# Patient Record
Sex: Male | Born: 1935
Health system: Southern US, Community
[De-identification: ages and names within clinical notes are randomized; demographics above are authoritative.]

## PROBLEM LIST (undated history)

## (undated) DIAGNOSIS — I714 Abdominal aortic aneurysm, without rupture, unspecified: Secondary | ICD-10-CM

## (undated) DIAGNOSIS — I639 Cerebral infarction, unspecified: Secondary | ICD-10-CM

## (undated) DIAGNOSIS — H9190 Unspecified hearing loss, unspecified ear: Secondary | ICD-10-CM

## (undated) DIAGNOSIS — E785 Hyperlipidemia, unspecified: Secondary | ICD-10-CM

## (undated) DIAGNOSIS — Z860101 Personal history of adenomatous and serrated colon polyps: Secondary | ICD-10-CM

## (undated) DIAGNOSIS — K649 Unspecified hemorrhoids: Secondary | ICD-10-CM

## (undated) DIAGNOSIS — K922 Gastrointestinal hemorrhage, unspecified: Secondary | ICD-10-CM

## (undated) DIAGNOSIS — I493 Ventricular premature depolarization: Secondary | ICD-10-CM

## (undated) DIAGNOSIS — Z87438 Personal history of other diseases of male genital organs: Secondary | ICD-10-CM

## (undated) DIAGNOSIS — I495 Sick sinus syndrome: Secondary | ICD-10-CM

## (undated) DIAGNOSIS — R2681 Unsteadiness on feet: Secondary | ICD-10-CM

## (undated) DIAGNOSIS — I739 Peripheral vascular disease, unspecified: Secondary | ICD-10-CM

## (undated) DIAGNOSIS — Z860100 Personal history of colon polyps, unspecified: Secondary | ICD-10-CM

## (undated) DIAGNOSIS — K219 Gastro-esophageal reflux disease without esophagitis: Secondary | ICD-10-CM

## (undated) DIAGNOSIS — M6282 Rhabdomyolysis: Secondary | ICD-10-CM

## (undated) DIAGNOSIS — K5790 Diverticulosis of intestine, part unspecified, without perforation or abscess without bleeding: Secondary | ICD-10-CM

## (undated) DIAGNOSIS — E669 Obesity, unspecified: Secondary | ICD-10-CM

## (undated) DIAGNOSIS — H3530 Unspecified macular degeneration: Secondary | ICD-10-CM

## (undated) DIAGNOSIS — G629 Polyneuropathy, unspecified: Secondary | ICD-10-CM

## (undated) DIAGNOSIS — K449 Diaphragmatic hernia without obstruction or gangrene: Secondary | ICD-10-CM

## (undated) DIAGNOSIS — M810 Age-related osteoporosis without current pathological fracture: Secondary | ICD-10-CM

## (undated) HISTORY — DX: Personal history of colonic polyps: Z86.010

## (undated) HISTORY — DX: Sick sinus syndrome: I49.5

## (undated) HISTORY — DX: Rhabdomyolysis: M62.82

## (undated) HISTORY — DX: Diaphragmatic hernia without obstruction or gangrene: K44.9

## (undated) HISTORY — DX: Gastrointestinal hemorrhage, unspecified: K92.2

## (undated) HISTORY — DX: Peripheral vascular disease, unspecified: I73.9

## (undated) HISTORY — DX: Hyperlipidemia, unspecified: E78.5

## (undated) HISTORY — DX: Unsteadiness on feet: R26.81

## (undated) HISTORY — DX: Ventricular premature depolarization: I49.3

## (undated) HISTORY — DX: Unspecified macular degeneration: H35.30

## (undated) HISTORY — DX: Unspecified hearing loss, unspecified ear: H91.90

## (undated) HISTORY — DX: Cerebral infarction, unspecified: I63.9

## (undated) HISTORY — PX: ABDOMINAL AORTIC ANEURYSM REPAIR: SUR1152

## (undated) HISTORY — DX: Unspecified hemorrhoids: K64.9

## (undated) HISTORY — PX: INGUINAL HERNIA REPAIR: SUR1180

## (undated) HISTORY — PX: KNEE SURGERY: SHX244

## (undated) HISTORY — PX: TONSILLECTOMY: SUR1361

## (undated) HISTORY — DX: Personal history of other diseases of male genital organs: Z87.438

## (undated) HISTORY — DX: Obesity, unspecified: E66.9

## (undated) HISTORY — DX: Personal history of colon polyps, unspecified: Z86.0100

## (undated) HISTORY — PX: CATARACT EXTRACTION: SUR2

---
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
## 1958-11-01 DIAGNOSIS — K449 Diaphragmatic hernia without obstruction or gangrene: Secondary | ICD-10-CM

## 1958-11-01 HISTORY — DX: Diaphragmatic hernia without obstruction or gangrene: K44.9

## 2004-09-01 ENCOUNTER — Ambulatory Visit: Payer: Self-pay | Admitting: Gastroenterology

## 2004-09-15 ENCOUNTER — Ambulatory Visit: Payer: Self-pay | Admitting: Gastroenterology

## 2004-09-15 ENCOUNTER — Ambulatory Visit (HOSPITAL_COMMUNITY): Admission: RE | Admit: 2004-09-15 | Discharge: 2004-09-15 | Payer: Self-pay | Admitting: Gastroenterology

## 2005-10-08 ENCOUNTER — Encounter: Admission: RE | Admit: 2005-10-08 | Discharge: 2005-10-08 | Payer: Self-pay

## 2006-06-10 ENCOUNTER — Ambulatory Visit: Payer: Self-pay | Admitting: Gastroenterology

## 2006-06-28 ENCOUNTER — Encounter: Payer: Self-pay | Admitting: Gastroenterology

## 2006-06-28 ENCOUNTER — Ambulatory Visit: Payer: Self-pay | Admitting: Gastroenterology

## 2009-04-30 ENCOUNTER — Encounter (INDEPENDENT_AMBULATORY_CARE_PROVIDER_SITE_OTHER): Payer: Self-pay | Admitting: *Deleted

## 2009-11-17 ENCOUNTER — Telehealth: Payer: Self-pay | Admitting: Gastroenterology

## 2009-12-24 ENCOUNTER — Encounter (INDEPENDENT_AMBULATORY_CARE_PROVIDER_SITE_OTHER): Payer: Self-pay | Admitting: *Deleted

## 2010-02-09 ENCOUNTER — Ambulatory Visit: Payer: Self-pay | Admitting: Gastroenterology

## 2010-02-09 ENCOUNTER — Encounter (INDEPENDENT_AMBULATORY_CARE_PROVIDER_SITE_OTHER): Payer: Self-pay | Admitting: *Deleted

## 2010-02-09 DIAGNOSIS — Z860100 Personal history of colon polyps, unspecified: Secondary | ICD-10-CM | POA: Insufficient documentation

## 2010-02-09 DIAGNOSIS — K219 Gastro-esophageal reflux disease without esophagitis: Secondary | ICD-10-CM

## 2010-03-24 ENCOUNTER — Ambulatory Visit: Payer: Self-pay | Admitting: Gastroenterology

## 2010-03-26 ENCOUNTER — Encounter: Payer: Self-pay | Admitting: Gastroenterology

## 2010-12-03 NOTE — Progress Notes (Signed)
Summary: Schedule recall colon  Phone Note Outgoing Call Call back at [facility] Phone [phone]   Call placed by: Christie Nottingham CMA Duncan Dull),  November 17, 2009 11:07 AM Call placed to: Patient Summary of Call: Called pt to schedule recall colonoscopy. Pt states he will be going to see his PCP next week and wants to discuss the recall with him first before scheduling. I informed pt of the importance of scheduling his recall due to his adenomatous polyps in the past. Pt agreed and states he will call us back next week to schedule. Initial call taken by: Christie Nottingham CMA Duncan Dull),  November 17, 2009 11:09 AM

## 2010-12-03 NOTE — Procedures (Signed)
Summary: Colonoscopy and biopsy   Colonoscopy  Procedure date:  06/28/2006  Findings:      Results: Hemorrhoids.     Results: Diverticulosis.       Pathology:  Adenomatous polyp.        Location:  [facility].    Procedures Next Due Date:    Colonoscopy: 07/2009  Colonoscopy  Procedure date:  06/28/2006  Findings:      Results: Hemorrhoids.     Results: Diverticulosis.       Pathology:  Adenomatous polyp.        Location:  [facility].    Procedures Next Due Date:    Colonoscopy: 07/2009 Patient Name: Carl, Rios MRN: [id number] Procedure Procedures: Colonoscopy CPT: 40981.    with polypectomy. CPT: A3573898.  Personnel: Endoscopist: Venita Lick. Russella Dar, MD, Clementeen Graham.  Exam Location: Exam performed in Outpatient Clinic. Outpatient  Patient Consent: Procedure, Alternatives, Risks and Benefits discussed, consent obtained, from patient. Consent was obtained by the RN.  Indications  Average Risk Screening Routine.  History  Current Medications: Patient is not currently taking Coumadin.  Pre-Exam Physical: Performed Jun 28, 2006. Cardio-pulmonary exam, Rectal exam, HEENT exam , Abdominal exam, Mental status exam WNL.  Comments: Pt. history reviewed/updated, physical exam performed prior to initiation of sedation?Yes Exam Exam: Extent of exam reached: Cecum, extent intended: Cecum.  The cecum was identified by appendiceal orifice and IC valve. Time to Cecum: 00:09: 54. Time for Withdrawl: 00:10:54. Colon retroflexion performed. ASA Classification: II. Tolerance: excellent.  Monitoring: Pulse and BP monitoring, Oximetry used. Supplemental O2 given.  Colon Prep Used MoviPrep for colon prep. Prep results: good.  Sedation Meds: Patient assessed and found to be appropriate for moderate (conscious) sedation. Fentanyl 100 mcg. given IV. Versed 10 mg. given IV.  Findings NORMAL EXAM: Splenic Flexure to Descending Colon.  POLYP: Ascending  Colon, Maximum size: 3 mm. sessile polyp. Procedure:  snare without cautery, removed, retrieved, Polyp sent to pathology. ICD9: Colon Polyps: 211.3.  - DIVERTICULOSIS: Sigmoid Colon. Not bleeding. ICD9: Diverticulosis: 562.10.  NORMAL EXAM: Cecum.  POLYP: Sigmoid Colon, Maximum size: 8 mm. pedunculated polyp. Procedure:  snare with cautery, removed, retrieved, sent to pathology. ICD9: Colon Polyps: 211.3.  POLYP: Sigmoid Colon, Maximum size: 3 mm. sessile polyp. Procedure:  snare without cautery, removed, retrieved, sent to pathology. ICD9: Colon Polyps: 211.3.  HEMORRHOIDS: Internal. Size: Small. Not bleeding. Not thrombosed. ICD9: Hemorrhoids, Internal: 455.0.   Assessment  Diagnoses: 211.3: Colon Polyps.  562.10: Diverticulosis.  455.0: Hemorrhoids, Internal.   Events  Unplanned Interventions: No intervention was required.  Unplanned Events: There were no complications. Plans  Post Exam Instructions: Post sedation instructions given. No aspirin or non-steroidal containing medications: 2 weeks.  Medication Plan: Await pathology.  Patient Education: Patient given standard instructions for: Polyps. Diverticulosis. Hemorrhoids.  Disposition: After procedure patient sent to recovery. After recovery patient sent home.  Scheduling/Referral: Primary Care Provider, to Kari Baars, MD, as planned,  Colonoscopy, to [facility] T. Russella Dar, MD, [facility], if polyp(s) adenomatous and health status appropriate, around Jun 28, 2009.    This report was created from the original endoscopy report, which was reviewed and signed by the above listed endoscopist.    cc: Robert Bellow. Clelia Croft, MD             SP Surgical Pathology - STATUS: Final  Perform Date: 28Aug07 00:01  Ordered By: Rica Records Date: 29Aug07 15:08  Facility: [facility]                               Department: CPATH  Service Report Text  [facility]   [address] [address]   [place], [place] [address]   Telephone [phone] or [phone] Fax [phone]    REPORT OF SURGICAL PATHOLOGY    Case #: [id number]   Patient Name: Carl Rios, Carl Rios.   Office Chart Number: [id number]    MRN: [id number]   Pathologist: Arvid Right, MD   DOB[birth year]Age [birth year] (Age: 77) Gender: M   Date Taken: 06/28/2006   Date Received: 06/29/2006    FINAL DIAGNOSIS    ***MICROSCOPIC EXAMINATION AND DIAGNOSIS***    ASCENDING AND SIGMOID COLON POLYPS (ENDOSCOPIC BIOPSY):   - THREE TUBULAR ADENOMAS.   - NO HIGH GRADE DYSPLASIA IDENTIFIED.   - TWO HYPERPLASTIC POLYPS    kv   Date Reported: 06/30/2006 Crystal Moore-Maxwell, MD   *** Electronically Signed Out By [facility] ***    Clinical information   Screening; R/O adenomas (caf)    specimen(s) obtained   Colon, polyp(s), ascending and sigmoid    Gross Description   Received in formalin are tan, soft tissue fragments that are   submitted in toto. Number: seven.   Size: 0.1 to 0.6 cm. One block. (TB:caf 06/29/06)    cf/

## 2010-12-03 NOTE — Letter (Signed)
Summary: [facility] Instructions  [facility]  [address] [place], [place] [address]   Phone: [phone]  Fax: [phone]       Carl Rios    August 30, 1926    MRN: [id number]        Procedure Day /Date: Tuesday May 24th, 2011     Arrival Time: 9:30am     Procedure Time: 10:30am     Location of Procedure:                    _x _  [facility] (4th Floor)                        PREPARATION FOR COLONOSCOPY WITH MOVIPREP   Starting 5 days prior to your procedure 5/19/11do not eat nuts, seeds, popcorn, corn, beans, peas,  salads, or any raw vegetables.  Do not take any fiber supplements (e.g. Metamucil, Citrucel, and Benefiber).  THE DAY BEFORE YOUR PROCEDURE         DATE: 03/23/10  DAY: Monday  1.  Drink clear liquids the entire day-NO SOLID FOOD  2.  Do not drink anything colored red or purple.  Avoid juices with pulp.  No orange juice.  3.  Drink at least 64 oz. (8 glasses) of fluid/clear liquids during the day to prevent dehydration and help the prep work efficiently.  CLEAR LIQUIDS INCLUDE: Water Jello Ice Popsicles Tea (sugar ok, no milk/cream) Powdered fruit flavored drinks Coffee (sugar ok, no milk/cream) Gatorade Juice: apple, white grape, white cranberry  Lemonade Clear bullion, consomm, broth Carbonated beverages (any kind) Strained chicken noodle soup Hard Candy                             4.  In the morning, mix first dose of MoviPrep solution:    Empty 1 Pouch A and 1 Pouch B into the disposable container    Add lukewarm drinking water to the top line of the container. Mix to dissolve    Refrigerate (mixed solution should be used within 24 hrs)  5.  Begin drinking the prep at 5:00 p.m. The MoviPrep container is divided by 4 marks.   Every 15 minutes drink the solution down to the next mark (approximately 8 oz) until the full liter is complete.   6.  Follow completed prep with 16 oz of clear liquid of your choice  (Nothing red or purple).  Continue to drink clear liquids until bedtime.  7.  Before going to bed, mix second dose of MoviPrep solution:    Empty 1 Pouch A and 1 Pouch B into the disposable container    Add lukewarm drinking water to the top line of the container. Mix to dissolve    Refrigerate  THE DAY OF YOUR PROCEDURE      DATE: 03/24/10 DAY: Tuesday  Beginning at 5:30 a.m. (5 hours before procedure):         1. Every 15 minutes, drink the solution down to the next mark (approx 8 oz) until the full liter is complete.  2. Follow completed prep with 16 oz. of clear liquid of your choice.    3. You may drink clear liquids until 8:30am (2 HOURS BEFORE PROCEDURE).   MEDICATION INSTRUCTIONS  Unless otherwise instructed, you should take regular prescription medications with a small sip of water   as early as possible the morning of your procedure.  OTHER INSTRUCTIONS  You will need a responsible adult at least 75 years of age to accompany you and drive you home.   This person must remain in the waiting room during your procedure.  Wear loose fitting clothing that is easily removed.  Leave jewelry and other valuables at home.  However, you may wish to bring a book to read or  an iPod/MP3 player to listen to music as you wait for your procedure to start.  Remove all body piercing jewelry and leave at home.  Total time from sign-in until discharge is approximately 2-3 hours.  You should go home directly after your procedure and rest.  You can resume normal activities the  day after your procedure.  The day of your procedure you should not:   Drive   Make legal decisions   Operate machinery   Drink alcohol   Return to work  You will receive specific instructions about eating, activities and medications before you leave.    The above instructions have been reviewed and explained to me by   Estill Bamberg.     I fully understand and can verbalize these  instructions _____________________________ Date _________

## 2010-12-03 NOTE — Procedures (Signed)
Summary: EGD   EGD  Procedure date:  09/15/2004  Findings:      Findings: Stricture:  Location: [facility]   Patient Name: Carl Rios, Carl Rios MRN: [id number] Procedure Procedures: Panendoscopy (EGD) CPT: 43235.    with biopsy(s)/brushing(s). CPT: D1846139.    with balloon dilation. CPT: T9508883.  Personnel: Endoscopist: Venita Lick. Russella Dar, MD, Clementeen Graham.  Referred By: Sandra Cockayne, MD.  Exam Location: Exam performed in Endoscopy Suite. Outpatient  Patient Consent: Procedure, Alternatives, Risks and Benefits discussed, consent obtained, from patient.  Indications Symptoms: Dysphagia. Reflux symptoms  History  Current Medications: Patient is not currently taking Coumadin.  Pre-Exam Physical: Performed Sep 15, 2004  Entire physical exam was normal.  Exam Exam Info: Maximum depth of insertion Duodenum, intended Duodenum. Vocal cords not visualized. Gastric retroflexion performed. ASA Classification: II. Tolerance: excellent.  Sedation Meds: Patient assessed and found to be appropriate for moderate (conscious) sedation. Cetacaine Spray 2 sprays given aerosolized. Fentanyl 50 mcg. given IV. Versed 4 mg. given IV.  Monitoring: BP and pulse monitoring done. Oximetry used. Supplemental O2 given  Findings Normal: Proximal Esophagus to Mid Esophagus.  STRICTURE / STENOSIS: Stricture in Distal Esophagus.  Constriction: partial. Lumen diameter is 14 mm. Biopsy of Stricture/Steno  taken. ICD9: Esophageal Stricture: 530.3.  - Dilation: Distal Esophagus. Balloon/Microvasive dilator used, Diameter: 15,16.5,18 mm, Minimal Resistance, No Heme present on extraction. Patient tolerance excellent. Outcome: successful. Comments: Microvasive CRE.  HIATAL HERNIA: Regular, 4 cms. in length. ICD9: Hernia, Hiatal: 553.3. Normal: Fundus to Duodenal 2nd Portion.   Assessment  Diagnoses: 530.3: Esophageal Stricture.  553.3: Hernia, Hiatal.   Events  Unplanned Intervention: No  unplanned interventions were required.  Unplanned Events: There were no complications. Plans Instructions: Nothing to eat or drink for 1 hour.  Clear or full liquids: 2 hours. No aspirin or non-steroidal containing medications: 1 week.  Medication(s): Await pathology. Continue current medications. PPI: Esomeprazole/Nexium 40 mg QAM, for indefinitely.   Patient Education: Patient given standard instructions for: Hiatal Hernia. Reflux. Stenosis / Stricture.  Disposition: After procedure patient sent to recovery. After recovery patient sent home.  Scheduling: Office Visit, to [employer]. Russella Dar, MD, Clementeen Graham, prn  Primary Care Provider, to Sandra Cockayne, MD,    This report was created from the original endoscopy report, which was reviewed and signed by the above listed endoscopist.    cc: Robert Bellow. Clelia Croft, MD

## 2010-12-03 NOTE — Procedures (Signed)
Summary: Colonoscopy  Patient: Carl Rios Note: All result statuses are Final unless otherwise noted.  Tests: (1) Colonoscopy (COL)   COL Colonoscopy           DONE     [facility]     [address]     [place], [place]  [address]           COLONOSCOPY PROCEDURE REPORT           PATIENT:  Carl Rios, Carl Rios  MR#:  [id number]     BIRTHDATE:  10/14/1926, 84 yrs. old  GENDER:  male     ENDOSCOPIST:  Judie Petit T. Russella Dar, MD, [facility]           PROCEDURE DATE:  03/24/2010     PROCEDURE:  Colonoscopy with biopsy and snare polypectomy     ASA CLASS:  Class II     INDICATIONS:  1) follow-up of polyp  2) surveillance and high-risk     screening, adenomatous polyps, 06/2006.     MEDICATIONS:   Fentanyl 75 mcg IV, Versed 6 mg IV     DESCRIPTION OF PROCEDURE:   After the risks benefits and     alternatives of the procedure were thoroughly explained, informed     consent was obtained.  Digital rectal exam was performed and     revealed no abnormalities.   The [id number] endoscope was     introduced through the anus and advanced to the cecum, which was     identified by both the appendix and ileocecal valve, without     limitations.  The quality of the prep was excellent, using     MoviPrep.  The instrument was then slowly withdrawn as the colon     was fully examined.     <<PROCEDUREIMAGES>>           FINDINGS:  A sessile polyp was found in the ascending colon. It     was 4 mm in size. The polyp was removed using cold biopsy forceps.     A sessile polyp was found in the mid transverse colon. It was 6 mm     in size. Polyp was snared without cautery. Retrieval was     successful. Two polyps were found in the rectum. They were 4 mm in     size. Polyps were snared without cautery. Retrieval was     successful. Moderate diverticulosis was found in the sigmoid     colon. A normal appearing cecum, ileocecal valve, and appendiceal     orifice were identified. The  hepatic flexure, splenic  flexure,     descending appeared unremarkable.  Retroflexed views in the rectum     revealed internal hemorrhoids, moderate. The time to cecum =  4.5     minutes. The scope was then withdrawn (time =  10.5  min) from the     patient and the procedure completed.     COMPLICATIONS:  None           ENDOSCOPIC IMPRESSION:     1) 4 mm sessile polyp in the ascending colon     2) 6 mm sessile polyp in the mid transverse colon     3) 4 mm Two polyps in the rectum     4) Moderate diverticulosis in the sigmoid colon     5) Internal hemorrhoids     RECOMMENDATIONS:     1) Await pathology results     2) Repeat Colonoscopy in  5 years if health status appropriate           Drina Jobst T. Russella Dar, MD, Clementeen Graham           CC: Kari Baars, MD           n.     Rosalie DoctorVenita Lick. [employer] at 03/24/2010 10:53 AM           Keturah Shavers, [id number]  Note: An exclamation mark (!) indicates a result that was not dispersed into the flowsheet. Document Creation Date: 03/24/2010 10:54 AM _______________________________________________________________________  (1) Order result status: Final Collection or observation date-time: 03/24/2010 10:47 Requested date-time:  Receipt date-time:  Reported date-time:  Referring Physician:   Ordering Physician: Claudette Head [phone]) Specimen Source:  Source: Launa Grill Order Number: [id number] Lab site:   Appended Document: Colonoscopy     Procedures Next Due Date:    Colonoscopy: 03/2015

## 2010-12-03 NOTE — Letter (Signed)
Summary: New Patient letter  [facility]  [address] [place], [place] [address]   Phone: [phone]  Fax: [phone]       12/24/2009 MRN: [id number]  [facility] [address] [place], [place]  [address]  Dear Carl Rios,  Welcome to the Gastroenterology Division at [facility].    You are scheduled to see Dr.  Claudette Head on February 09, 2010 at 11:00am on the 3rd floor at [facility], [address]  We ask that you try to arrive at our office 15 minutes prior to your appointment time to allow for check-in.  We would like you to complete the enclosed self-administered evaluation form prior to your visit and bring it with you on the day of your appointment.  We will review it with you.  Also, please bring a complete list of all your medications or, if you prefer, bring the medication bottles and we will list them.  Please bring your insurance card so that we may make a copy of it.  If your insurance requires a referral to see a specialist, please bring your referral form from your primary care physician.  Co-payments are due at the time of your visit and may be paid by cash, check or credit card.     Your office visit will consist of a consult with your physician (includes a physical exam), any laboratory testing he/she may order, scheduling of any necessary diagnostic testing (e.g. x-ray, ultrasound, CT-scan), and scheduling of a procedure (e.g. Endoscopy, Colonoscopy) if required.  Please allow enough time on your schedule to allow for any/all of these possibilities.    If you cannot keep your appointment, please call [phone] to cancel or reschedule prior to your appointment date.  This allows [place] the opportunity to schedule an appointment for another patient in need of care.  If you do not cancel or reschedule by 5 p.m. the business day prior to your appointment date, you will be charged a $50.00 late cancellation/no-show fee.    Thank you  for choosing [facility] for your medical needs.  We appreciate the opportunity to care for you.  Please visit [place] at our website  to learn more about our practice.                     Sincerely,                                                             The Gastroenterology Division

## 2010-12-03 NOTE — Letter (Signed)
Summary: Patient Notice- Polyp Results  [facility]  [address] [place], [place] [address]   Phone: [phone]  Fax: [phone]        Mar 26, 2010 MRN: [id number]    Carl Rios [address] [place], [place]  [address]    Dear [name],  I am pleased to inform you that the colon polyp(s) removed during your recent colonoscopy was (were) found to be benign (no cancer detected) upon pathologic examination.  I recommend you have a repeat colonoscopy examination in 5 years to look for recurrent polyps, as having colon polyps increases your risk for having recurrent polyps or even colon cancer in the future.  Should you develop new or worsening symptoms of abdominal pain, bowel habit changes or bleeding from the rectum or bowels, please schedule an evaluation with either your primary care physician or with me.  Continue treatment plan as outlined the day of your exam.  Please call us if you are having persistent problems or have questions about your condition that have not been fully answered at this time.  Sincerely,  Meryl Dare MD [facility]  This letter has been electronically signed by your physician.  Appended Document: Patient Notice- Polyp Results letter mailed.

## 2010-12-03 NOTE — Assessment & Plan Note (Signed)
Summary: Gastroenterology  Tarun Patchell MR#:  [id number] Page #  NAME:  Carl Rios, Carl Rios  OFFICE NO:  [id number]  DATE:  09/01/04  DOB:   01/21/2026  REFERRING PHYSICIAN:  Dr. Eric Form  REASON FOR REFERRAL:  Dysphagia and reflux symptoms.  HISTORY OF PRESENT ILLNESS:  The patient is a very nice 77 year old white male referred through the courtesy of Dr. Clelia Croft.  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  He relates intermittent problems with heartburn and reflux symptoms for the past couple of years and also intermittent episodes of solid food dysphagia.  He relates a history of hemorrhoids but currently has no rectal bleeding, rectal pain, change in bowel habits, or change in stool caliber.  He denies any weight loss or chest pain.  He was started on Prilosec OTC recently, which has helped his reflux symptoms and solid food dysphagia partially.  He relates having an upper gastrointestinal series performed in about 1960 that showed a hiatal hernia.  PAST MEDICAL HISTORY:  Status post inguinal hernia repair 55 years ago, history of hiatal hernia and gastroesophageal reflux disease, history of an undescended testicle status post removal.  CURRENT MEDICATIONS:  Prilosec 20 mg OTC q.d.   MEDICATION ALLERGIES:  No known drug allergies.  SOCIAL HISTORY:  He is married with 3 children.  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  He drinks about 4 alcoholic beverages a week and denies any heavier usages.  He denies any tobacco product usage.  REVIEW OF SYSTEMS:  Entirely negative.  PHYSICAL EXAM:  Overweight white male in no acute distress.  Weight 214 pounds.  Blood pressure 122/84.  Pulse 80 and regular.  HEENT exam:  Anicteric sclerae.  Oropharynx clear.  Chest:  Clear to auscultation and percussion.  Cardiac:  Regular rate and rhythm without murmurs.   Abdomen:  Soft, nontender, and nondistended with normoactive bowel sounds.  No palpable organomegaly, masses, or hernias.  Rectal examination deferred.  Neurologic:  Alert and oriented x3.  Grossly nonfocal.  ASSESSMENT AND PLAN:  Reflux symptoms with solid food dysphagia.  History of a hiatal hernia.  Presumably, he has a peptic stricture.  Need to rule out esophageal and proximal gastric neoplasms.  Rule out erosive esophagitis.  He is given verbal and written instructions on all standard antireflux measures, and he is advised to increase his Prilosec to 20 mg p.o. b.i.d. taken 30 minutes before breakfast and dinner.  If this is not completely effective, we will begin a trial of Nexium 40 mg p.o. q.a.m.  Risks, benefits, and alternatives to upper endoscopy with possible biopsy and possible dilation were discussed with the patient, and he consents to proceed.  This will be scheduled electively.       Venita Lick. Russella Dar, M.D., F.A.C.G.  [id number] cc:  Dr. Eric Form D:  09/01/04; T:  ; Job [phone]

## 2010-12-03 NOTE — Assessment & Plan Note (Signed)
Summary: consult before col ---age...em   History of Present Illness Visit Type: Initial Visit Primary GI MD: Elie Goody MD [facility] Primary Provider: Martha Clan, MD Chief Complaint: Patient here to discuss having colonoscopy at age 77. He denies any lower GI problems but does complain of some very occsasional reflux. History of Present Illness:   This is an 77 year old male with a history of adenomatous colon polyps, initially diagnosed in August 2007. He was recommended to undergo a three-year surveillance examination.  He has GERD with a prior history of peptic stricture and his reflux well-controlled on Prilosec OTC. BPH is under fair control and he has no symptoms related to diverticulosis or hemorrhoids.   GI Review of Systems    Reports acid reflux and  bloating.      Denies abdominal pain, belching, chest pain, dysphagia with liquids, dysphagia with solids, heartburn, loss of appetite, nausea, vomiting, vomiting blood, weight loss, and  weight gain.        Denies anal fissure, black tarry stools, change in bowel habit, constipation, diarrhea, diverticulosis, fecal incontinence, heme positive stool, hemorrhoids, irritable bowel syndrome, jaundice, light color stool, liver problems, rectal bleeding, and  rectal pain. Preventive Screening-Counseling & Management  Alcohol-Tobacco     Smoking Status: quit  Caffeine-Diet-Exercise     Does Patient Exercise: yes   Current Medications (verified): 1)  Rapaflo 8 Mg Caps (Silodosin) .... Take 1 Tablet By Mouth At Bedtime 2)  Prilosec Otc 20 Mg Tbec (Omeprazole Magnesium) .... One Tablet By Mouth Once Daily  Allergies (verified): 1)  ! Sulfamethoxazole  Past History:  Past Medical History: Reviewed history from 02/05/2010 and no changes required. Hemorrhoids Esophageal stricture Hiatal hernia GERD Undescended testicle Diverticulosis Adenomatous Colon Polyps 06/2006  Past Surgical History: Inguinal Hernia  surgery Removal of undescended testicle left knee surgery OU cataract extractions, 2010  Family History: No FH of Colon Cancer: Family History of Heart Disease: Father  Social History: Reviewed history from 02/05/2010 and no changes required. 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 Alcohol Use - yes-moderate Illicit Drug Use - no Patient is a former smoker. -stopped in the 1950'S Patient gets regular exercise. Smoking Status:  quit Does Patient Exercise:  yes  Review of Systems       The patient complains of hearing problems, urination - excessive, and vision changes.         The pertinent positives and negatives are noted as above and in the HPI. All other ROS were reviewed and were negative.   Vital Signs:  Patient profile:   77 year old male Height:      68 inches Weight:      216.25 pounds BMI:     33.00 BSA:     2.11 Pulse rate:   84 / minute Pulse rhythm:   regular BP sitting:   122 / 72  (left arm)  Vitals Entered By: Hortense Ramal CMA Duncan Dull) (February 09, 2010 10:52 AM)  Physical Exam  General:  Well developed, well nourished, no acute distress. Head:  Normocephalic and atraumatic. Eyes:  PERRLA, no icterus. Ears:  Normal auditory acuity. Mouth:  No deformity or lesions, dentition normal. Neck:  Supple; no masses or thyromegaly. Lungs:  Clear throughout to auscultation. Heart:  Regular rate and rhythm; no murmurs, rubs,  or bruits. Abdomen:  Soft, nontender and nondistended. No masses, hepatosplenomegaly or hernias noted. Normal bowel sounds. Rectal:  deferred until time  of colonoscopy.   Msk:  Symmetrical with no gross deformities. Normal posture. Pulses:  Normal pulses noted. Extremities:  No clubbing, cyanosis, edema or deformities noted. Neurologic:  Alert and  oriented x4;  grossly normal neurologically. Cervical Nodes:  No significant cervical adenopathy. Inguinal Nodes:  No  significant inguinal adenopathy. Psych:  Alert and cooperative. Normal mood and affect.  Impression & Recommendations:  Problem # 1:  COLONIC POLYPS, ADENOMATOUS, HX OF (ICD-V12.72) Personal history of adenomatous colon polyps. Surveillance colonoscopy is due. His overall health status is very good and he would like to proceed with surveillance. The risks, benefits and alternatives to colonoscopy with possible biopsy and possible polypectomy were discussed with the patient and they consent to proceed. The procedure will be scheduled electively. Orders: Colonoscopy (Colon)  Problem # 2:  GERD (ICD-530.81) GERD with a prior history of peptic stricture. Continue long-term antireflux measures and Prilosec 20 mg q.a.m.  Patient Instructions: 1)  Colonoscopy brochure given.  2)  Pick up prep from your pharmacy.  3)  Copy sent to : Martha Clan, MD 4)  The medication list was reviewed and reconciled.  All changed / newly prescribed medications were explained.  A complete medication list was provided to the patient / caregiver.  Prescriptions: MOVIPREP 100 GM  SOLR (PEG-KCL-NACL-NASULF-NA ASC-C) As per prep instructions.  #1 x 0   Entered by:   Christie Nottingham CMA (AAMA)   Authorized by:   Meryl Dare MD [facility]   Signed by:   Meryl Dare MD FACG on 02/09/2010   Method used:   Electronically to        [facility] [phone]* (retail)       [address]       [place], [place]  [address]       Ph: [phone] or [phone]       Fax: [phone]   RxID:   [id number]

## 2011-11-03 DIAGNOSIS — H353190 Nonexudative age-related macular degeneration, unspecified eye, stage unspecified: Secondary | ICD-10-CM | POA: Diagnosis not present

## 2011-11-03 DIAGNOSIS — H538 Other visual disturbances: Secondary | ICD-10-CM | POA: Diagnosis not present

## 2011-11-03 DIAGNOSIS — H40019 Open angle with borderline findings, low risk, unspecified eye: Secondary | ICD-10-CM | POA: Diagnosis not present

## 2011-11-03 DIAGNOSIS — H04129 Dry eye syndrome of unspecified lacrimal gland: Secondary | ICD-10-CM | POA: Diagnosis not present

## 2011-11-04 DIAGNOSIS — IMO0002 Reserved for concepts with insufficient information to code with codable children: Secondary | ICD-10-CM | POA: Diagnosis not present

## 2011-11-23 DIAGNOSIS — M79609 Pain in unspecified limb: Secondary | ICD-10-CM | POA: Diagnosis not present

## 2011-11-25 DIAGNOSIS — M79609 Pain in unspecified limb: Secondary | ICD-10-CM | POA: Diagnosis not present

## 2011-11-30 DIAGNOSIS — M79609 Pain in unspecified limb: Secondary | ICD-10-CM | POA: Diagnosis not present

## 2011-12-02 DIAGNOSIS — M79609 Pain in unspecified limb: Secondary | ICD-10-CM | POA: Diagnosis not present

## 2011-12-07 DIAGNOSIS — M79609 Pain in unspecified limb: Secondary | ICD-10-CM | POA: Diagnosis not present

## 2011-12-09 DIAGNOSIS — M79609 Pain in unspecified limb: Secondary | ICD-10-CM | POA: Diagnosis not present

## 2011-12-14 DIAGNOSIS — M79609 Pain in unspecified limb: Secondary | ICD-10-CM | POA: Diagnosis not present

## 2011-12-16 DIAGNOSIS — M79609 Pain in unspecified limb: Secondary | ICD-10-CM | POA: Diagnosis not present

## 2011-12-20 DIAGNOSIS — R82998 Other abnormal findings in urine: Secondary | ICD-10-CM | POA: Diagnosis not present

## 2011-12-20 DIAGNOSIS — E785 Hyperlipidemia, unspecified: Secondary | ICD-10-CM | POA: Diagnosis not present

## 2011-12-20 DIAGNOSIS — R7301 Impaired fasting glucose: Secondary | ICD-10-CM | POA: Diagnosis not present

## 2011-12-20 DIAGNOSIS — Z125 Encounter for screening for malignant neoplasm of prostate: Secondary | ICD-10-CM | POA: Diagnosis not present

## 2011-12-21 DIAGNOSIS — M79609 Pain in unspecified limb: Secondary | ICD-10-CM | POA: Diagnosis not present

## 2011-12-23 DIAGNOSIS — M79609 Pain in unspecified limb: Secondary | ICD-10-CM | POA: Diagnosis not present

## 2011-12-24 DIAGNOSIS — Z1212 Encounter for screening for malignant neoplasm of rectum: Secondary | ICD-10-CM | POA: Diagnosis not present

## 2011-12-27 DIAGNOSIS — R7301 Impaired fasting glucose: Secondary | ICD-10-CM | POA: Diagnosis not present

## 2011-12-27 DIAGNOSIS — Z0000 Encounter for general adult medical examination without abnormal findings: Secondary | ICD-10-CM | POA: Diagnosis not present

## 2011-12-27 DIAGNOSIS — G609 Hereditary and idiopathic neuropathy, unspecified: Secondary | ICD-10-CM | POA: Diagnosis not present

## 2011-12-27 DIAGNOSIS — K219 Gastro-esophageal reflux disease without esophagitis: Secondary | ICD-10-CM | POA: Diagnosis not present

## 2011-12-27 DIAGNOSIS — E785 Hyperlipidemia, unspecified: Secondary | ICD-10-CM | POA: Diagnosis not present

## 2011-12-27 DIAGNOSIS — Z125 Encounter for screening for malignant neoplasm of prostate: Secondary | ICD-10-CM | POA: Diagnosis not present

## 2011-12-28 DIAGNOSIS — M79609 Pain in unspecified limb: Secondary | ICD-10-CM | POA: Diagnosis not present

## 2011-12-30 DIAGNOSIS — S93409A Sprain of unspecified ligament of unspecified ankle, initial encounter: Secondary | ICD-10-CM | POA: Diagnosis not present

## 2011-12-30 DIAGNOSIS — M79609 Pain in unspecified limb: Secondary | ICD-10-CM | POA: Diagnosis not present

## 2011-12-30 DIAGNOSIS — S92919A Unspecified fracture of unspecified toe(s), initial encounter for closed fracture: Secondary | ICD-10-CM | POA: Diagnosis not present

## 2011-12-31 DIAGNOSIS — K922 Gastrointestinal hemorrhage, unspecified: Secondary | ICD-10-CM

## 2011-12-31 HISTORY — DX: Gastrointestinal hemorrhage, unspecified: K92.2

## 2012-01-03 ENCOUNTER — Other Ambulatory Visit: Payer: Self-pay | Admitting: Sports Medicine

## 2012-01-03 DIAGNOSIS — M545 Low back pain: Secondary | ICD-10-CM

## 2012-01-03 DIAGNOSIS — I714 Abdominal aortic aneurysm, without rupture: Secondary | ICD-10-CM

## 2012-01-04 ENCOUNTER — Other Ambulatory Visit: Payer: Self-pay | Admitting: Sports Medicine

## 2012-01-04 ENCOUNTER — Ambulatory Visit
Admission: RE | Admit: 2012-01-04 | Discharge: 2012-01-04 | Disposition: A | Discharge status: Home / Self Care | Payer: Self-pay | Source: Ambulatory Visit | Attending: Sports Medicine | Admitting: Sports Medicine

## 2012-01-04 DIAGNOSIS — M545 Low back pain: Secondary | ICD-10-CM

## 2012-01-04 DIAGNOSIS — I714 Abdominal aortic aneurysm, without rupture: Secondary | ICD-10-CM | POA: Diagnosis not present

## 2012-01-06 DIAGNOSIS — M79609 Pain in unspecified limb: Secondary | ICD-10-CM | POA: Diagnosis not present

## 2012-01-11 DIAGNOSIS — M79609 Pain in unspecified limb: Secondary | ICD-10-CM | POA: Diagnosis not present

## 2012-01-13 DIAGNOSIS — M79609 Pain in unspecified limb: Secondary | ICD-10-CM | POA: Diagnosis not present

## 2012-01-13 DIAGNOSIS — S92919A Unspecified fracture of unspecified toe(s), initial encounter for closed fracture: Secondary | ICD-10-CM | POA: Diagnosis not present

## 2012-01-17 DIAGNOSIS — M545 Low back pain: Secondary | ICD-10-CM | POA: Diagnosis not present

## 2012-01-17 DIAGNOSIS — M25569 Pain in unspecified knee: Secondary | ICD-10-CM | POA: Diagnosis not present

## 2012-01-18 ENCOUNTER — Inpatient Hospital Stay (HOSPITAL_COMMUNITY)
Admission: EM | Admit: 2012-01-18 | Discharge: 2012-01-21 | DRG: 378 | Disposition: A | Discharge status: Home / Self Care | Payer: Medicare Other | Attending: Internal Medicine | Admitting: Internal Medicine

## 2012-01-18 ENCOUNTER — Encounter (HOSPITAL_COMMUNITY): Payer: Self-pay | Admitting: Emergency Medicine

## 2012-01-18 ENCOUNTER — Other Ambulatory Visit: Payer: Self-pay

## 2012-01-18 ENCOUNTER — Emergency Department (HOSPITAL_COMMUNITY): Payer: Medicare Other

## 2012-01-18 DIAGNOSIS — D62 Acute posthemorrhagic anemia: Secondary | ICD-10-CM | POA: Diagnosis present

## 2012-01-18 DIAGNOSIS — K625 Hemorrhage of anus and rectum: Secondary | ICD-10-CM | POA: Diagnosis present

## 2012-01-18 DIAGNOSIS — K5731 Diverticulosis of large intestine without perforation or abscess with bleeding: Secondary | ICD-10-CM | POA: Diagnosis not present

## 2012-01-18 DIAGNOSIS — N318 Other neuromuscular dysfunction of bladder: Secondary | ICD-10-CM | POA: Diagnosis present

## 2012-01-18 DIAGNOSIS — E876 Hypokalemia: Secondary | ICD-10-CM | POA: Diagnosis not present

## 2012-01-18 DIAGNOSIS — E669 Obesity, unspecified: Secondary | ICD-10-CM | POA: Diagnosis present

## 2012-01-18 DIAGNOSIS — H3530 Unspecified macular degeneration: Secondary | ICD-10-CM | POA: Diagnosis present

## 2012-01-18 DIAGNOSIS — I714 Abdominal aortic aneurysm, without rupture, unspecified: Secondary | ICD-10-CM | POA: Diagnosis present

## 2012-01-18 DIAGNOSIS — K219 Gastro-esophageal reflux disease without esophagitis: Secondary | ICD-10-CM | POA: Diagnosis present

## 2012-01-18 DIAGNOSIS — I10 Essential (primary) hypertension: Secondary | ICD-10-CM | POA: Diagnosis not present

## 2012-01-18 DIAGNOSIS — D649 Anemia, unspecified: Secondary | ICD-10-CM | POA: Diagnosis not present

## 2012-01-18 DIAGNOSIS — Z860100 Personal history of colon polyps, unspecified: Secondary | ICD-10-CM

## 2012-01-18 DIAGNOSIS — R7301 Impaired fasting glucose: Secondary | ICD-10-CM | POA: Diagnosis present

## 2012-01-18 DIAGNOSIS — K5790 Diverticulosis of intestine, part unspecified, without perforation or abscess without bleeding: Secondary | ICD-10-CM | POA: Diagnosis present

## 2012-01-18 DIAGNOSIS — R112 Nausea with vomiting, unspecified: Secondary | ICD-10-CM | POA: Diagnosis present

## 2012-01-18 DIAGNOSIS — G609 Hereditary and idiopathic neuropathy, unspecified: Secondary | ICD-10-CM | POA: Insufficient documentation

## 2012-01-18 DIAGNOSIS — E785 Hyperlipidemia, unspecified: Secondary | ICD-10-CM | POA: Insufficient documentation

## 2012-01-18 DIAGNOSIS — K5730 Diverticulosis of large intestine without perforation or abscess without bleeding: Secondary | ICD-10-CM | POA: Diagnosis not present

## 2012-01-18 HISTORY — DX: Abdominal aortic aneurysm, without rupture, unspecified: I71.40

## 2012-01-18 HISTORY — DX: Gastro-esophageal reflux disease without esophagitis: K21.9

## 2012-01-18 HISTORY — DX: Abdominal aortic aneurysm, without rupture: I71.4

## 2012-01-18 HISTORY — DX: Personal history of adenomatous and serrated colon polyps: Z86.0101

## 2012-01-18 HISTORY — DX: Personal history of colonic polyps: Z86.010

## 2012-01-18 HISTORY — DX: Diverticulosis of intestine, part unspecified, without perforation or abscess without bleeding: K57.90

## 2012-01-18 HISTORY — DX: Polyneuropathy, unspecified: G62.9

## 2012-01-18 HISTORY — DX: Hyperlipidemia, unspecified: E78.5

## 2012-01-18 LAB — COMPREHENSIVE METABOLIC PANEL
ALT: 14 U/L (ref 0–53)
AST: 18 U/L (ref 0–37)
Albumin: 3.9 g/dL (ref 3.5–5.2)
Alkaline Phosphatase: 94 U/L (ref 39–117)
BUN: 22 mg/dL (ref 6–23)
CO2: 25 mEq/L (ref 19–32)
Calcium: 9.7 mg/dL (ref 8.4–10.5)
Chloride: 104 mEq/L (ref 96–112)
Creatinine, Ser: 0.82 mg/dL (ref 0.50–1.35)
GFR calc Af Amer: >90 mL/min (ref >90)
GFR calc non Af Amer: 79 mL/min — ABNORMAL LOW (ref >90)
Glucose, Bld: 112 mg/dL — ABNORMAL HIGH (ref 70–99)
Potassium: 4.2 mEq/L (ref 3.5–5.1)
Sodium: 140 mEq/L (ref 135–145)
Total Bilirubin: 0.7 mg/dL (ref 0.3–1.2)
Total Protein: 6.7 g/dL (ref 6.0–8.3)

## 2012-01-18 LAB — URINALYSIS, ROUTINE W REFLEX MICROSCOPIC
Bilirubin Urine: NEGATIVE
Glucose, UA: NEGATIVE mg/dL
Hgb urine dipstick: NEGATIVE
Leukocytes, UA: NEGATIVE
Nitrite: NEGATIVE
Protein, ur: NEGATIVE mg/dL
Specific Gravity, Urine: 1.031 — ABNORMAL HIGH (ref 1.005–1.030)
Urobilinogen, UA: 1 mg/dL (ref 0.0–1.0)
pH: 5.5 (ref 5.0–8.0)

## 2012-01-18 LAB — CBC
HCT: 45.3 % (ref 39.0–52.0)
HCT: 39.4 % (ref 39.0–52.0)
Hemoglobin: 14.9 g/dL (ref 13.0–17.0)
MCH: 30 pg (ref 26.0–34.0)
MCHC: 32.9 g/dL (ref 30.0–36.0)
MCHC: 33.5 g/dL (ref 30.0–36.0)
MCV: 91.3 fL (ref 78.0–100.0)
Platelets: 287 10*3/uL (ref 150–400)
Platelets: 271 10*3/uL (ref 150–400)
RBC: 4.96 MIL/uL (ref 4.22–5.81)
RDW: 13.6 % (ref 11.5–15.5)
RDW: 13.7 % (ref 11.5–15.5)
WBC: 8.4 10*3/uL (ref 4.0–10.5)
WBC: 9.4 10*3/uL (ref 4.0–10.5)

## 2012-01-18 LAB — DIFFERENTIAL
Basophils Absolute: 0 10*3/uL (ref 0.0–0.1)
Basophils Relative: 1 % (ref 0–1)
Eosinophils Absolute: 0.1 10*3/uL (ref 0.0–0.7)
Eosinophils Relative: 1 % (ref 0–5)
Lymphocytes Relative: 19 % (ref 12–46)
Lymphs Abs: 1.6 10*3/uL (ref 0.7–4.0)
Monocytes Absolute: 0.5 10*3/uL (ref 0.1–1.0)
Monocytes Relative: 5 % (ref 3–12)
Neutro Abs: 6.3 10*3/uL (ref 1.7–7.7)
Neutrophils Relative %: 75 % (ref 43–77)

## 2012-01-18 LAB — GLUCOSE, CAPILLARY

## 2012-01-18 MED ORDER — PANTOPRAZOLE SODIUM 40 MG PO TBEC
40.0000 mg | DELAYED_RELEASE_TABLET | Freq: Every day | ORAL | Status: DC
  Administered 2012-01-19 – 2012-01-21 (×4): 40 mg via ORAL
  Filled 2012-01-18 (×3): qty 1

## 2012-01-18 MED ORDER — ACETAMINOPHEN 650 MG RE SUPP: 650.0000 mg | Freq: Four times a day (QID) | RECTAL | Status: DC | PRN

## 2012-01-18 MED ORDER — SODIUM CHLORIDE 0.9 % IV SOLN
INTRAVENOUS | Status: DC
  Administered 2012-01-19 – 2012-01-21 (×2): via INTRAVENOUS

## 2012-01-18 MED ORDER — IOHEXOL 300 MG/ML  SOLN
100.0000 mL | Freq: Once | INTRAMUSCULAR | Status: AC | PRN
  Administered 2012-01-18: 100 mL via INTRAVENOUS

## 2012-01-18 MED ORDER — INSULIN ASPART 100 UNIT/ML ~~LOC~~ SOLN
0.0000 [IU] | Freq: Three times a day (TID) | SUBCUTANEOUS | Status: DC
  Administered 2012-01-20 (×2): 1 [IU] via SUBCUTANEOUS

## 2012-01-18 MED ORDER — ACETAMINOPHEN 325 MG PO TABS: 650.0000 mg | ORAL_TABLET | Freq: Four times a day (QID) | ORAL | Status: DC | PRN

## 2012-01-18 NOTE — ED Provider Notes (Signed)
Patient relates has a history of of abdominal aortic aneurysm. Patient presented to the emergency department after having some) blood per rectum. He denies any abdominal pain. He states his last colonoscopy was about 2 years ago he had 5 polyps removed and also had some hemorrhoids. This was done by Dr. Russella Dar of [facility]. He states about 30 minutes before he came in room he had another bloody bowel movement. He denies any abdominal pain. He states he's never had bleeding before. His PCP is Dr. Clelia Croft with [facility]  Pt is alert, cooperative, is color is good without being pale.   VS have been stable Initial HB is normal.  19:51 Dr Felipa Eth will come see patient.   Diagnoses that have been ruled out:  None  Diagnoses that are still under consideration:  None  Final diagnoses:  Rectal bleed   Plan probable admission per Dr Felipa Eth  Devoria Albe, MD, Franz Dell, MD 01/18/12 [phone]

## 2012-01-18 NOTE — H&P (Signed)
PCP:   Kari Baars, MD, MD   Chief Complaint:  Rectal bleeding painless  HPI: This is an 78 year old male, previously was along Hospital, retired, with a past medical history significant for hyperlipidemia, impaired fasting glucose, peripheral neuropathy, reflux and an overactive bladder who presents with his first case of painless rectal bleeding. This started for the first time this morning after arising to urinate. Patient denies any presyncope, abdominal discomfort, nausea, vomiting, fevers, chills, further denies any chest pain, shortness of breath. Called her office and was instructed to go to the emergency room for further evaluation and management. He does have recently diagnosed history of a AAA. He was hemodynamically stable in the emergency room with a hemoglobin that was normal. Had no further episodes initially of rectal bleeding, underwent CT scan with results as dictated below but significant for a stable AAA, and colonic diverticula extending into the sigmoid colon. No evidence of diverticulitis. Later in the day, the patient had another episode of large-volume rectal bleeding by his account, not visualized by any of the staff however emergency room physician did take knowledge that he had a rectal fissure, external hemorrhoid and was quite positive based on exam. He is now being admitted for further evaluation and management of probable diverticular bleeding with GI consultation.  Review of Systems:  Patient denies any fevers, chills, significant weight changes, changes in bowel habits up until today, typically having one bowel movement each day, denies any issues with swallowing, uncontrolled reflux, focal neurological deficits, chest pain, palpitations, shortness of breath, wheezing, cough, abdominal discomfort, gross hematuria, lower extremity edema but does have significant issues with chronic lower extremity neuropathy affecting the distal toes. Patient further denies any  anorexia  Past Medical History: Past Medical History  Diagnosis Date  . AAA (abdominal aortic aneurysm) without rupture    hyperlipidemia Impaired fasting glucose Peripheral neuropathy Obesity Gastroesophageal reflux disease History of acute prostatitis Macula degeneration Overactive bladder Past Surgical History  Procedure Date  . Knee surgery     left knee in 1970's   strip hernia repair History of undescended testicle 1949 Knee cartilage removal 1965 Esophageal dilatation 2005 Status post bilateral cataract repair in 2010  Medications: Prior to Admission medications   Medication Sig Start Date End Date Taking? Authorizing Provider  aspirin EC 81 MG tablet Take 81 mg by mouth daily.   Yes Historical Provider, MD  Calcium Carbonate-Vitamin D (CALCIUM + D PO) Take 1 tablet by mouth daily.   Yes Historical Provider, MD  multivitamin-lutein (OCUVITE-LUTEIN) CAPS Take 1 capsule by mouth daily.   Yes Historical Provider, MD  Patient also states that he takes Prilosec 20 mg each day  Allergies:   Allergies  Allergen Reactions  . Sulfamethoxazole Other (See Comments)    UNKNOWN    Social History:  reports that he quit smoking about 53 years ago. He has never used smokeless tobacco. He reports that he drinks about 1.5 ounces of alcohol per week. He reports that he does not use illicit drugs. 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 Quit smoking in 1957  Family History: Father having died at the age of 75 a rheumatic heart, mother died at the age of 34, 3 children alive and well  Physical Exam: Filed Vitals:   01/18/12 1328 01/18/12 1715 01/18/12 1943  BP: 157/78 131/74 119/70  Pulse: 105 82 89  Temp: 98.6 F (37 C)  97.9 F (36.6 C)  TempSrc: Oral  Oral  Resp: 16 16  18   Weight:  91.627 kg (202 lb)   SpO2: 96% 96% 98%   General physical exam Obese Evils equally round and reactive to light and accommodation, extraocular movements are intact No oropharyngeal lesions, face is symmetric, tongue is midline, tympanic membranes are intact with canals clear Neck supple, no cervical lymphadenopathy, no thyromegaly, neck lungs clear to auscultation bilaterally with no accessory muscle usage Cardiovascular reveals regular rate and rhythm without murmurs rubs or gallops appreciated No axillary lymphadenopathy Abdomen soft, nontender, nondistended-per ER physician and provider, rectal exam reveals fissures, external hemorrhoid and guaiac positivity next there is no peripheral edema, pedal pulses are intact, subjective decrease in light touch to the distal toes Neurologic exam patient alert oriented x3, cranial nerves II through XII grossly intact, neurological exam nonfocal grossly, no tremor     Labs on Admission:   [facility] 01/18/12 1403  NA 140  K 4.2  CL 104  CO2 25  GLUCOSE 112*  BUN 22  CREATININE 0.82  CALCIUM 9.7  MG --  PHOS --    Basename 01/18/12 1403  AST 18  ALT 14  ALKPHOS 94  BILITOT 0.7  PROT 6.7  ALBUMIN 3.9   No results found for this basename: LIPASE:2,AMYLASE:2 in the last 72 hours  Basename 01/18/12 1403  WBC 8.4  NEUTROABS 6.3  HGB 14.9  HCT 45.3  MCV 91.3  PLT 287   No results found for this basename: CKTOTAL:3,CKMB:3,CKMBINDEX:3,TROPONINI:3 in the last 72 hours No results found for this basename: TSH,T4TOTAL,FREET3,T3FREE,THYROIDAB in the last 72 hours No results found for this basename: VITAMINB12:2,FOLATE:2,FERRITIN:2,TIBC:2,IRON:2,RETICCTPCT:2 in the last 72 hours  Radiological Exams on Admission: Ct Abdomen Pelvis W Contrast  01/18/2012  *RADIOLOGY REPORT*  Clinical Data: Rectal bleeding.  History of abdominal aortic aneurysm.  CT ABDOMEN AND PELVIS WITH CONTRAST  Technique:  Multidetector CT imaging of the  abdomen and pelvis was performed following the standard protocol during bolus administration of intravenous contrast.  Contrast: OMNIPAQUE IOHEXOL 300 MG/ML IJ SOLN  Comparison: No priors.  Findings:  Lung Bases: Calcifications in the distal right coronary artery. Small hiatal hernia.  Abdomen/Pelvis:  Small amount of high attenuation material layering dependently in the gallbladder, suggestive of biliary sludge and/or small gallstones.  However, the gallbladder is not significantly distended, nor is there significant pericholecystic fluid or stranding at this time to suggest the presence of an acute cholecystitis.  The enhanced appearance of the liver, pancreas, spleen, bilateral adrenal glands and right kidney are unremarkable. There are multiple subcentimeter low attenuation lesions in the left kidney which is too small to definitively characterize.  In addition, there is a exophytic 1.4 cm intermediate attenuation lesion extending off the anterior aspect of the upper pole of the left kidney which may have some internal soft tissue or enhancement.  A fusiform infrarenal abdominal aortic aneurysm is noted measuring up to 4.2 cm in diameter.  There is a small amount of these centric nonenhancing material in the sac of the aneurysm, likely represent mural thrombus and/or atheromatous plaque.  More proximally within the aorta, at the level of the hiatus, there is an ulcerated plaque.  No penetrating aortic ulcer or dissection is noted at this time.  The iliac vasculature is remarkable for moderate atherosclerosis, without evidence of aneurysm or dissection.  Normal retrocecal appendix.  There are  numerous colonic diverticula, predominately of the distal descending colon and sigmoid colon, without surrounding inflammatory changes at this time to suggest an acute diverticulitis.  There is no ascites or pneumoperitoneum and no pathologic distension of bowel.  No definite pathologic adenopathy identified within the  abdomen or pelvis.  Multiple calcifications within the prostate gland (which is only mildly enlarged).  Urinary bladder is unremarkable.  Musculoskeletal: There are no aggressive appearing lytic or blastic lesions noted in the visualized portions of the skeleton.  Mild compression fracture of superior endplate of L4 with less than 10% loss of vertebral body height, which appears chronic.  IMPRESSION: 1.  Colonic diverticulosis without findings to suggest acute diverticulitis. This may be a source of rectal bleeding in this patient. 2.  4.2 cm fusiform infrarenal abdominal aortic aneurysm, as above. 3.  Small amount of biliary sludge and/or small gallstones layering dependently within the gallbladder lumen.  No findings to suggest acute cholecystitis at this time. 4.  Multiple subcentimeter low attenuation lesions in the left kidney are too small to definitively characterize.  In addition, however, there is a 1.4 cm intermediate attenuation lesion extending  exophytically from the upper pole of the left kidney which may have some internal soft tissue or enhancement.  Further evaluation with non emergent renal ultrasound is recommended to exclude underlying neoplasm. 5.  Small hiatal hernia. 6. Atherosclerosis, including right coronary artery disease. Please note that although the presence of coronary artery calcium documents the presence of coronary artery disease, the severity of this disease and any potential stenosis cannot be assessed on this non-gated CT examination.  Assessment for potential risk factor modification, dietary therapy or pharmacologic therapy may be warranted, if clinically indicated.  Original Report Authenticated By: Florencia Reasons, M.D.   US Aorta  01/04/2012  *RADIOLOGY REPORT*  Clinical Data:  Back pain.  History of abdominal aortic aneurysm.  ULTRASOUND OF ABDOMINAL AORTA  Technique:  Ultrasound examination of the abdominal aorta was performed to evaluate for abdominal aortic  aneurysm.  Comparison: None.  Abdominal Aorta:  Infrarenal abdominal aortic aneurysm is noted with mural thrombus.        Maximum AP diameter:  4.7 cm.       Maximum TRV diameter:  4.2 cm.  Right common iliac artery:  Maximum AP diameter 1.2 cm.  Maximum transverse diameter 1.4 cm.  Left common iliac artery:  Maximum AP diameter 1.6 cm.  Maximum transverse diameter 1.5 cm.  IMPRESSION:  4.7 x 4.2 cm infrarenal abdominal aortic aneurysm with mural thrombus.  Original Report Authenticated By: P. Loralie Champagne, M.D.   Orders placed during the hospital encounter of 01/18/12  . ED EKG  . ED EKG    Assessment/Plan Active Problems:  Rectal bleeding-presumably diverticular with history of diverticulosis based on review of colonoscopy from May, 2011 by Dr. Claudette Head, will treat supportively with IV fluids, clear fluid diet, gastroenterology has already been contacted and will see the patient in the morning unless patient decompensates overnight. Will obtain serial CBCs and monitor on telemetry. Patient has had one second episode of rectal bleeding while here in the emergency room nonvisualized, will monitor for additional. History of reflux, will maintain on proton pump inhibitor and monitor History of colon polyps adenomatous-reviewed from Dr. Anselm Jungling colonoscopy report May, 2011 was additional findings including diverticulosis  Impaired fasting glucose-last hemoglobin A1c 5.3%, will monitor with sliding scale insulin as needed.    Cali Hope R 01/18/2012, 9:26 PM

## 2012-01-18 NOTE — ED Provider Notes (Signed)
Medical screening examination/treatment/procedure(s) were conducted as a shared visit with non-physician practitioner(s) and myself.  I personally evaluated the patient during the encounter  Pt seen and examined, no abd pain, denies syncope, doubt aaa rupture, awaiitng abd ct--will likely admit for gi bleeding  Toy Baker, MD 01/18/12 1537

## 2012-01-18 NOTE — ED Notes (Signed)
Reports one episode of bright red blood in stool today, states he was dx with AAA 2d ago, also c/o dizzyness, no LOC, no N/V/D, no abd pain, A/O X4, NAD

## 2012-01-18 NOTE — ED Provider Notes (Signed)
History     CSN: [id number]  Arrival date & time 01/18/12  1239   First MD Initiated Contact with Patient 01/18/12 1328      Chief Complaint  Patient presents with  . Rectal Bleeding    (Consider location/radiation/quality/duration/timing/severity/associated sxs/prior treatment) HPI The patient presents to the ER with onset of rectal bleeding that started this a.m. The patient states that he has no pain. The patient has no CP, SOB, weakness, N/V, abdominal pain, fever, hematemesis, or dizziness. The patient states that he recently found out that he had an aortic aneurysm.  Past Medical History  Diagnosis Date  . AAA (abdominal aortic aneurysm) without rupture     Past Surgical History  Procedure Date  . Knee surgery     left knee in 1970's    History reviewed. No pertinent family history.  History  Substance Use Topics  . Smoking status: Former Smoker -- 5 years    Quit date: 01/18/1959  . Smokeless tobacco: Never Used  . Alcohol Use: 1.5 oz/week    3 drink(s) per week     3 drinks per week      Review of Systems All pertinent positives and negatives reviewed in the history of present illness  Allergies  Sulfamethoxazole  Home Medications   Current Outpatient Rx  Name Route Sig Dispense Refill  . ASPIRIN EC 81 MG PO TBEC Oral Take 81 mg by mouth daily.    Marland Kitchen CALCIUM + D PO Oral Take 1 tablet by mouth daily.    . OCUVITE-LUTEIN PO CAPS Oral Take 1 capsule by mouth daily.      BP 119/70  Pulse 89  Temp(Src) 97.9 F (36.6 C) (Oral)  Resp 18  Wt 202 lb (91.627 kg)  SpO2 98%  Physical Exam  Constitutional: He appears well-developed and well-nourished. No distress.  HENT:  Head: Normocephalic and atraumatic.  Eyes: Pupils are equal, round, and reactive to light.  Cardiovascular: Normal rate, regular rhythm and normal heart sounds.  Exam reveals no gallop and no friction rub.   No murmur heard. Pulmonary/Chest: Effort normal and breath sounds normal. No  respiratory distress.  Abdominal: Soft. Bowel sounds are normal. He exhibits no distension. There is no tenderness.  Genitourinary: Rectal exam shows external hemorrhoid. Rectal exam shows no fissure. Guaiac positive stool.  Skin: Skin is warm and dry. No rash noted.    ED Course  Procedures (including critical care time)  Labs Reviewed  COMPREHENSIVE METABOLIC PANEL - Abnormal; Notable for the following:    Glucose, Bld 112 (*)    GFR calc non Af Amer 79 (*)    All other components within normal limits  URINALYSIS, ROUTINE W REFLEX MICROSCOPIC - Abnormal; Notable for the following:    Color, Urine AMBER (*) BIOCHEMICALS MAY BE AFFECTED BY COLOR   Specific Gravity, Urine 1.031 (*)    Ketones, ur TRACE (*)    All other components within normal limits  CBC  DIFFERENTIAL  OCCULT BLOOD, POC DEVICE  OCCULT BLOOD X 1 CARD TO LAB, STOOL   Ct Abdomen Pelvis W Contrast  01/18/2012  *RADIOLOGY REPORT*  Clinical Data: Rectal bleeding.  History of abdominal aortic aneurysm.  CT ABDOMEN AND PELVIS WITH CONTRAST  Technique:  Multidetector CT imaging of the abdomen and pelvis was performed following the standard protocol during bolus administration of intravenous contrast.  Contrast: OMNIPAQUE IOHEXOL 300 MG/ML IJ SOLN  Comparison: No priors.  Findings:  Lung Bases: Calcifications in the distal right  coronary artery. Small hiatal hernia.  Abdomen/Pelvis:  Small amount of high attenuation material layering dependently in the gallbladder, suggestive of biliary sludge and/or small gallstones.  However, the gallbladder is not significantly distended, nor is there significant pericholecystic fluid or stranding at this time to suggest the presence of an acute cholecystitis.  The enhanced appearance of the liver, pancreas, spleen, bilateral adrenal glands and right kidney are unremarkable. There are multiple subcentimeter low attenuation lesions in the left kidney which is too small to definitively  characterize.  In addition, there is a exophytic 1.4 cm intermediate attenuation lesion extending off the anterior aspect of the upper pole of the left kidney which may have some internal soft tissue or enhancement.  A fusiform infrarenal abdominal aortic aneurysm is noted measuring up to 4.2 cm in diameter.  There is a small amount of these centric nonenhancing material in the sac of the aneurysm, likely represent mural thrombus and/or atheromatous plaque.  More proximally within the aorta, at the level of the hiatus, there is an ulcerated plaque.  No penetrating aortic ulcer or dissection is noted at this time.  The iliac vasculature is remarkable for moderate atherosclerosis, without evidence of aneurysm or dissection.  Normal retrocecal appendix.  There are numerous colonic diverticula, predominately of the distal descending colon and sigmoid colon, without surrounding inflammatory changes at this time to suggest an acute diverticulitis.  There is no ascites or pneumoperitoneum and no pathologic distension of bowel.  No definite pathologic adenopathy identified within the abdomen or pelvis.  Multiple calcifications within the prostate gland (which is only mildly enlarged).  Urinary bladder is unremarkable.  Musculoskeletal: There are no aggressive appearing lytic or blastic lesions noted in the visualized portions of the skeleton.  Mild compression fracture of superior endplate of L4 with less than 10% loss of vertebral body height, which appears chronic.  IMPRESSION: 1.  Colonic diverticulosis without findings to suggest acute diverticulitis. This may be a source of rectal bleeding in this patient. 2.  4.2 cm fusiform infrarenal abdominal aortic aneurysm, as above. 3.  Small amount of biliary sludge and/or small gallstones layering dependently within the gallbladder lumen.  No findings to suggest acute cholecystitis at this time. 4.  Multiple subcentimeter low attenuation lesions in the left kidney are too  small to definitively characterize.  In addition, however, there is a 1.4 cm intermediate attenuation lesion extending  exophytically from the upper pole of the left kidney which may have some internal soft tissue or enhancement.  Further evaluation with non emergent renal ultrasound is recommended to exclude underlying neoplasm. 5.  Small hiatal hernia. 6. Atherosclerosis, including right coronary artery disease. Please note that although the presence of coronary artery calcium documents the presence of coronary artery disease, the severity of this disease and any potential stenosis cannot be assessed on this non-gated CT examination.  Assessment for potential risk factor modification, dietary therapy or pharmacologic therapy may be warranted, if clinically indicated.  Original Report Authenticated By: Florencia Reasons, M.D.     1. Rectal bleed     The patient will be admitted for his rectal bleeding. The patient has yet to see anyone about his AAA.  MDM  MDM Reviewed: nursing note and vitals Reviewed previous: labs Interpretation: labs and CT scan Consults: admitting MD            Carlyle Dolly, PA-C 01/18/12 2001

## 2012-01-19 ENCOUNTER — Encounter (HOSPITAL_COMMUNITY): Payer: Self-pay | Admitting: Nurse Practitioner

## 2012-01-19 DIAGNOSIS — I10 Essential (primary) hypertension: Secondary | ICD-10-CM | POA: Diagnosis not present

## 2012-01-19 DIAGNOSIS — K625 Hemorrhage of anus and rectum: Secondary | ICD-10-CM

## 2012-01-19 DIAGNOSIS — K5730 Diverticulosis of large intestine without perforation or abscess without bleeding: Secondary | ICD-10-CM

## 2012-01-19 DIAGNOSIS — E876 Hypokalemia: Secondary | ICD-10-CM | POA: Diagnosis not present

## 2012-01-19 DIAGNOSIS — K5731 Diverticulosis of large intestine without perforation or abscess with bleeding: Secondary | ICD-10-CM | POA: Diagnosis not present

## 2012-01-19 DIAGNOSIS — D649 Anemia, unspecified: Secondary | ICD-10-CM | POA: Diagnosis not present

## 2012-01-19 LAB — COMPREHENSIVE METABOLIC PANEL
ALT: 12 U/L (ref 0–53)
Albumin: 3.4 g/dL — ABNORMAL LOW (ref 3.5–5.2)
Alkaline Phosphatase: 76 U/L (ref 39–117)
Calcium: 8.9 mg/dL (ref 8.4–10.5)
GFR calc Af Amer: >90 mL/min (ref >90)
Glucose, Bld: 97 mg/dL (ref 70–99)
Potassium: 3.7 mEq/L (ref 3.5–5.1)
Sodium: 139 mEq/L (ref 135–145)
Total Protein: 5.8 g/dL — ABNORMAL LOW (ref 6.0–8.3)

## 2012-01-19 LAB — HEMOGLOBIN AND HEMATOCRIT, BLOOD
HCT: 38.5 % — ABNORMAL LOW (ref 39.0–52.0)
HCT: 37 % — ABNORMAL LOW (ref 39.0–52.0)
Hemoglobin: 12.9 g/dL — ABNORMAL LOW (ref 13.0–17.0)
Hemoglobin: 12.2 g/dL — ABNORMAL LOW (ref 13.0–17.0)

## 2012-01-19 LAB — PROTIME-INR: Prothrombin Time: 14.1 seconds (ref 11.6–15.2)

## 2012-01-19 LAB — CBC
Hemoglobin: 12.7 g/dL — ABNORMAL LOW (ref 13.0–17.0)
MCH: 30.2 pg (ref 26.0–34.0)
Platelets: 263 10*3/uL (ref 150–400)
RBC: 4.21 MIL/uL — ABNORMAL LOW (ref 4.22–5.81)
WBC: 7.9 10*3/uL (ref 4.0–10.5)

## 2012-01-19 LAB — GLUCOSE, CAPILLARY

## 2012-01-19 LAB — TYPE AND SCREEN
ABO/RH(D): B POS
Antibody Screen: NEGATIVE

## 2012-01-19 LAB — APTT: aPTT: 31 seconds (ref 24–37)

## 2012-01-19 NOTE — Progress Notes (Signed)
Subjective: Episode of Nausea/vomitting last pm associated with heartburn.  He attributes that to apple juice that he drank.  No abdominal pain.  No further bleeding in stools or BM since admission.  Feels a little lightheaded.  Objective: Vital signs in last 24 hours: Temp:  [97.9 F (36.6 C)-98.6 F (37 C)] 98 F (36.7 C) (03/20 0634) Pulse Rate:  [81-110] 110  (03/20 0640) Resp:  [16-18] 18  (03/20 0634) BP: (119-185)/(55-103) 185/103 mmHg (03/20 0640) SpO2:  [94 %-98 %] 94 % (03/20 0640) Weight:  [89.6 kg (197 lb 8.5 oz)-91.627 kg (202 lb)] 89.6 kg (197 lb 8.5 oz) (03/19 2256) Weight change:     CBG (last 3)   Basename 01/19/12 0717 01/18/12 2133  GLUCAP 99 98    Intake/Output from previous day: 03/19 0701 - 03/20 0700 In: 840 [P.O.:240; I.V.:600] Out: 575 [Urine:575] Intake/Output this shift:    General appearance: alert and no distress Eyes: no scleral icterus Throat: oropharynx moist without erythema Resp: clear to auscultation bilaterally Cardio: regular rate and rhythm GI: soft, non-tender; bowel sounds normal; no masses,  no organomegaly Extremities: no clubbing, cyanosis or edema   Lab Results:  Basename 01/19/12 0500 01/18/12 1403  NA 139 140  K 3.7 4.2  CL 106 104  CO2 25 25  GLUCOSE 97 112*  BUN 16 22  CREATININE 0.69 0.82  CALCIUM 8.9 9.7  MG -- --  PHOS -- --    Basename 01/19/12 0500 01/18/12 1403  AST 14 18  ALT 12 14  ALKPHOS 76 94  BILITOT 0.8 0.7  PROT 5.8* 6.7  ALBUMIN 3.4* 3.9    Basename 01/19/12 0500 01/18/12 2324 01/18/12 1403  WBC 7.9 9.4 --  NEUTROABS -- -- 6.3  HGB 12.7* 13.2 --  HCT 38.2* 39.4 --  MCV 90.7 91.4 --  PLT 263 271 --   Lab Results  Component Value Date   INR 1.07 01/18/2012    Studies/Results: Ct Abdomen Pelvis W Contrast  01/18/2012  *RADIOLOGY REPORT*  Clinical Data: Rectal bleeding.  History of abdominal aortic aneurysm.  CT ABDOMEN AND PELVIS WITH CONTRAST  Technique:  Multidetector CT imaging  of the abdomen and pelvis was performed following the standard protocol during bolus administration of intravenous contrast.  Contrast: OMNIPAQUE IOHEXOL 300 MG/ML IJ SOLN  Comparison: No priors.  Findings:  Lung Bases: Calcifications in the distal right coronary artery. Small hiatal hernia.  Abdomen/Pelvis:  Small amount of high attenuation material layering dependently in the gallbladder, suggestive of biliary sludge and/or small gallstones.  However, the gallbladder is not significantly distended, nor is there significant pericholecystic fluid or stranding at this time to suggest the presence of an acute cholecystitis.  The enhanced appearance of the liver, pancreas, spleen, bilateral adrenal glands and right kidney are unremarkable. There are multiple subcentimeter low attenuation lesions in the left kidney which is too small to definitively characterize.  In addition, there is a exophytic 1.4 cm intermediate attenuation lesion extending off the anterior aspect of the upper pole of the left kidney which may have some internal soft tissue or enhancement.  A fusiform infrarenal abdominal aortic aneurysm is noted measuring up to 4.2 cm in diameter.  There is a small amount of these centric nonenhancing material in the sac of the aneurysm, likely represent mural thrombus and/or atheromatous plaque.  More proximally within the aorta, at the level of the hiatus, there is an ulcerated plaque.  No penetrating aortic ulcer or dissection is noted at  this time.  The iliac vasculature is remarkable for moderate atherosclerosis, without evidence of aneurysm or dissection.  Normal retrocecal appendix.  There are numerous colonic diverticula, predominately of the distal descending colon and sigmoid colon, without surrounding inflammatory changes at this time to suggest an acute diverticulitis.  There is no ascites or pneumoperitoneum and no pathologic distension of bowel.  No definite pathologic adenopathy identified  within the abdomen or pelvis.  Multiple calcifications within the prostate gland (which is only mildly enlarged).  Urinary bladder is unremarkable.  Musculoskeletal: There are no aggressive appearing lytic or blastic lesions noted in the visualized portions of the skeleton.  Mild compression fracture of superior endplate of L4 with less than 10% loss of vertebral body height, which appears chronic.  IMPRESSION: 1.  Colonic diverticulosis without findings to suggest acute diverticulitis. This may be a source of rectal bleeding in this patient. 2.  4.2 cm fusiform infrarenal abdominal aortic aneurysm, as above. 3.  Small amount of biliary sludge and/or small gallstones layering dependently within the gallbladder lumen.  No findings to suggest acute cholecystitis at this time. 4.  Multiple subcentimeter low attenuation lesions in the left kidney are too small to definitively characterize.  In addition, however, there is a 1.4 cm intermediate attenuation lesion extending  exophytically from the upper pole of the left kidney which may have some internal soft tissue or enhancement.  Further evaluation with non emergent renal ultrasound is recommended to exclude underlying neoplasm. 5.  Small hiatal hernia. 6. Atherosclerosis, including right coronary artery disease. Please note that although the presence of coronary artery calcium documents the presence of coronary artery disease, the severity of this disease and any potential stenosis cannot be assessed on this non-gated CT examination.  Assessment for potential risk factor modification, dietary therapy or pharmacologic therapy may be warranted, if clinically indicated.  Original Report Authenticated By: Florencia Reasons, M.D.     Medications: Scheduled:   . insulin aspart  0-9 Units Subcutaneous TID WC  . pantoprazole  40 mg Oral Q1200   Continuous:   . sodium chloride 75 mL/hr at 01/19/12 0201    Assessment/Plan: Active Problems: 1. Rectal bleeding-  slight decline in hemoglobin but no further bleeding.  Painless hematochezia is likely diverticular given lack of CT findings and colonoscopy findings in 2011.  Continue supportive care.  Monitor Hemoglobin.  GI consult pending to consider further evaluation. 2.GERD- continue PPI.  Doubt bleeding is an upper GI source given relatively normal BUN. 3. HTN- hold off on additional treatment due to risk of hypotension with bleeding and subjective orthostatic symptoms.  4. Impaired fasting glucose- continue SSI. 5. Diverticulosis 6. COLONIC POLYPS, ADENOMATOUS, HX OF 7. Disposition- anticipate discharge in 2 days if stable.  Ambulate.  Will advance diet after GI evaluation.   LOS: 1 day   Danyka Merlin,W DOUGLAS 01/19/2012, 8:01 AM

## 2012-01-19 NOTE — ED Provider Notes (Signed)
Medical screening examination/treatment/procedure(s) were conducted as a shared visit with non-physician practitioner(s) and myself.  I personally evaluated the patient during the encounter  Carl Rios T Landon Bassford, MD 01/19/12 0927 

## 2012-01-19 NOTE — Consult Note (Signed)
[facility] Consultation  Referring Provider:  Tyrell Antonio, MD Primary Care Physician:  Kari Baars, MD, MD Primary Gastroenterologist:   Claudette Head, MD Reason for Consultation:  rectal bleeding  HPI: Carl Rios is a 78 y.o. male known to Dr. Russella Dar admitted yesterday with painless hematochezia. He has known history of diverticulosis. Patient had a large bloody BM yesterday and another last night. He hasn't had any bleeding or BMs today. Patient did have one episode of vomiting (non-bloody emesis) after drinking some apple juice last night. Overall he feels okay  Past Medical History  Diagnosis Date  . AAA (abdominal aortic aneurysm) without rupture   . Hyperlipidemia   . Diverticulosis   . Peripheral neuropathy GERD Adenomatous colon polyps      Past Surgical History  Procedure Date  . Knee surgery     left knee in 1970's    Family Medical Disease Heart disease in father No Colon cancer in family  Prior to Admission medications   Medication Sig Start Date End Date Taking? Authorizing Provider  aspirin EC 81 MG tablet Take 81 mg by mouth daily.   Yes Historical Provider, MD  Calcium Carbonate-Vitamin D (CALCIUM + D PO) Take 1 tablet by mouth daily.   Yes Historical Provider, MD  multivitamin-lutein (OCUVITE-LUTEIN) CAPS Take 1 capsule by mouth daily.   Yes Historical Provider, MD    Current Facility-Administered Medications  Medication Dose Route Frequency Provider Last Rate Last Dose  . 0.9 %  sodium chloride infusion   Intravenous Continuous Ravisankar R Avva, MD 75 mL/hr at 01/19/12 0201    . acetaminophen (TYLENOL) tablet 650 mg  650 mg Oral Q6H PRN Ravisankar R Avva, MD       Or  . acetaminophen (TYLENOL) suppository 650 mg  650 mg Rectal Q6H PRN Ravisankar R Avva, MD      . insulin aspart (novoLOG) injection 0-9 Units  0-9 Units Subcutaneous TID WC Ravisankar R Avva, MD      . iohexol (OMNIPAQUE) 300 MG/ML solution 100 mL  100 mL  Intravenous Once PRN Medication Radiologist, MD   100 mL at 01/18/12 1755  . pantoprazole (PROTONIX) EC tablet 40 mg  40 mg Oral Q1200 Ravisankar R Avva, MD   40 mg at 01/19/12 0200    Allergies as of 01/18/2012 - Review Complete 01/18/2012  Allergen Reaction Noted  . Sulfamethoxazole Other (See Comments) 02/09/2010     History   Social History  . Marital Status: Married    Spouse Name: N/A    Number of Children: N/A  . Years of Education: N/A   Occupational History  . Not on file.   Social History Main Topics  . Smoking status: Former Smoker -- 5 years    Quit date: 01/18/1959  . Smokeless tobacco: Never Used  . Alcohol Use: 1.5 oz/week    3 drink(s) per week     3 drinks per week  . Drug Use: No  . Sexually Active:     Review of Systems: All systems reviewed and negative except where noted in HPI  PHYSICAL EXAM: Vital signs in last 24 hours: Temp:  [97.9 F (36.6 C)-98.6 F (37 C)] 98 F (36.7 C) (03/20 0634) Pulse Rate:  [81-110] 110  (03/20 0640) Resp:  [16-18] 18  (03/20 0634) BP: (119-185)/(55-103) 185/103 mmHg (03/20 0640) SpO2:  [94 %-98 %] 94 % (03/20 0640) Weight:  [197 lb 8.5 oz (89.6 kg)-202 lb (91.627 kg)] 197 lb 8.5 oz (89.6 kg) (  03/19 2256) Last BM Date: 01/18/12 General:   pleasant white  male in NAD Head:  Normocephalic and atraumatic. Eyes:   No icterus.   Conjunctiva pink. Ears:  Normal auditory acuity. Neck:  Supple; no masses felt Lungs:  Respirations even and unlabored. Lungs clear to auscultation bilaterally.   No wheezes, crackles, or rhonchi.  Heart:  Regular rate and rhythm; no murmurs heard. Abdomen:  Soft, nondistended, nontender. Normal bowel sounds. No appreciable masses or hepatomegaly.  Rectal:  External hemorrhoids, non-thrombosed. Scant amount of reddish stool in vault. .  Msk:  Symmetrical without gross deformities.  Extremities:  Without edema. Neurologic:  Alert and  oriented x4;  grossly normal neurologically. Skin:   Intact without significant lesions or rashes. Cervical Nodes:  No significant cervical adenopathy. Psych:  Alert and cooperative. Normal affect.  LAB RESULTS:  Basename 01/19/12 0500 01/18/12 2324 01/18/12 1403  WBC 7.9 9.4 8.4  HGB 12.7* 13.2 14.9  HCT 38.2* 39.4 45.3  PLT 263 271 287   BMET  Basename 01/19/12 0500 01/18/12 1403  NA 139 140  K 3.7 4.2  CL 106 104  CO2 25 25  GLUCOSE 97 112*  BUN 16 22  CREATININE 0.69 0.82  CALCIUM 8.9 9.7   LFT  Basename 01/19/12 0500  PROT 5.8*  ALBUMIN 3.4*  AST 14  ALT 12  ALKPHOS 76  BILITOT 0.8  BILIDIR --  IBILI --   PT/INR  Basename 01/18/12 2324  LABPROT 14.1  INR 1.07    STUDIES: Ct Abdomen Pelvis W Contrast  01/18/2012  *RADIOLOGY REPORT*  Clinical Data: Rectal bleeding.  History of abdominal aortic aneurysm.  CT ABDOMEN AND PELVIS WITH CONTRAST  Technique:  Multidetector CT imaging of the abdomen and pelvis was performed following the standard protocol during bolus administration of intravenous contrast.  Contrast: OMNIPAQUE IOHEXOL 300 MG/ML IJ SOLN  Comparison: No priors.  Findings:  Lung Bases: Calcifications in the distal right coronary artery. Small hiatal hernia.  Abdomen/Pelvis:  Small amount of high attenuation material layering dependently in the gallbladder, suggestive of biliary sludge and/or small gallstones.  However, the gallbladder is not significantly distended, nor is there significant pericholecystic fluid or stranding at this time to suggest the presence of an acute cholecystitis.  The enhanced appearance of the liver, pancreas, spleen, bilateral adrenal glands and right kidney are unremarkable. There are multiple subcentimeter low attenuation lesions in the left kidney which is too small to definitively characterize.  In addition, there is a exophytic 1.4 cm intermediate attenuation lesion extending off the anterior aspect of the upper pole of the left kidney which may have some internal soft tissue  or enhancement.  A fusiform infrarenal abdominal aortic aneurysm is noted measuring up to 4.2 cm in diameter.  There is a small amount of these centric nonenhancing material in the sac of the aneurysm, likely represent mural thrombus and/or atheromatous plaque.  More proximally within the aorta, at the level of the hiatus, there is an ulcerated plaque.  No penetrating aortic ulcer or dissection is noted at this time.  The iliac vasculature is remarkable for moderate atherosclerosis, without evidence of aneurysm or dissection.  Normal retrocecal appendix.  There are numerous colonic diverticula, predominately of the distal descending colon and sigmoid colon, without surrounding inflammatory changes at this time to suggest an acute diverticulitis.  There is no ascites or pneumoperitoneum and no pathologic distension of bowel.  No definite pathologic adenopathy identified within the abdomen or pelvis.  Multiple calcifications  within the prostate gland (which is only mildly enlarged).  Urinary bladder is unremarkable.  Musculoskeletal: There are no aggressive appearing lytic or blastic lesions noted in the visualized portions of the skeleton.  Mild compression fracture of superior endplate of L4 with less than 10% loss of vertebral body height, which appears chronic.  IMPRESSION: 1.  Colonic diverticulosis without findings to suggest acute diverticulitis. This may be a source of rectal bleeding in this patient. 2.  4.2 cm fusiform infrarenal abdominal aortic aneurysm, as above. 3.  Small amount of biliary sludge and/or small gallstones layering dependently within the gallbladder lumen.  No findings to suggest acute cholecystitis at this time. 4.  Multiple subcentimeter low attenuation lesions in the left kidney are too small to definitively characterize.  In addition, however, there is a 1.4 cm intermediate attenuation lesion extending  exophytically from the upper pole of the left kidney which may have some internal  soft tissue or enhancement.  Further evaluation with non emergent renal ultrasound is recommended to exclude underlying neoplasm. 5.  Small hiatal hernia. 6. Atherosclerosis, including right coronary artery disease. Please note that although the presence of coronary artery calcium documents the presence of coronary artery disease, the severity of this disease and any potential stenosis cannot be assessed on this non-gated CT examination.  Assessment for potential risk factor modification, dietary therapy or pharmacologic therapy may be warranted, if clinically indicated.  Original Report Authenticated By: Florencia Reasons, M.D.     PREVIOUS ENDOSCOPIES: Colonoscopy May 2011 Russella Dar) for surveillance. Findings: two rectal polyps, a transverse poyly and an ascending polyp, moderate sigmoid diverticulosis.   Colonoscopy Aug 2007 Russella Dar) for surveillance. Findings; diverticulosis, 3 polyps  EGD Nov 2005 Russella Dar) for reflux and dysphagia Findings: esophageal stricture and hiatal hernia.  IMPRESSION / PLAN: 1. Painless hematochezia, probably volume diverticular bleed. 2. Anemia of acute blood loss, hemoglobin down to 12.7 from 14.9 on admission. No bleeding since last night. Overall bleeding seems to be low volume. Continue to monitor and provide supportive care. Continue clears. 2. GERD, asymptomatic on PPI at home. 3. 4.2 AAA on CTscan, stable 4. Possible biliary sludge on CTscan  5. History of adenomatous colon polyps, up to date on surveillance colonoscopy.  Thanks   LOS: 1 day   Willette Cluster  01/19/2012, 9:26 AM  ________________________________________________________________________  [facility] MD note:  I personally examined the patient, reviewed the data and agree with the assessment and plan described above.  Fairly low volume lower GI bleeding, probably diverticular.  Last bloody output 7pm last night.  Will advance diet and observe him for clear rebleeding.  Probably ok to restart  ASA in 3-4 days as long as bleeding does not recur.   Rob Bunting, MD [facility] Pager [phone]

## 2012-01-20 DIAGNOSIS — E876 Hypokalemia: Secondary | ICD-10-CM | POA: Diagnosis not present

## 2012-01-20 DIAGNOSIS — I10 Essential (primary) hypertension: Secondary | ICD-10-CM | POA: Diagnosis not present

## 2012-01-20 DIAGNOSIS — K5730 Diverticulosis of large intestine without perforation or abscess without bleeding: Secondary | ICD-10-CM | POA: Diagnosis not present

## 2012-01-20 DIAGNOSIS — D649 Anemia, unspecified: Secondary | ICD-10-CM | POA: Diagnosis not present

## 2012-01-20 DIAGNOSIS — K625 Hemorrhage of anus and rectum: Secondary | ICD-10-CM | POA: Diagnosis not present

## 2012-01-20 DIAGNOSIS — K5731 Diverticulosis of large intestine without perforation or abscess with bleeding: Secondary | ICD-10-CM | POA: Diagnosis not present

## 2012-01-20 LAB — CBC
MCH: 29.9 pg (ref 26.0–34.0)
MCHC: 32.8 g/dL (ref 30.0–36.0)
MCV: 91.2 fL (ref 78.0–100.0)
Platelets: 296 10*3/uL (ref 150–400)
RBC: 4.31 MIL/uL (ref 4.22–5.81)
RDW: 13.7 % (ref 11.5–15.5)

## 2012-01-20 LAB — GLUCOSE, CAPILLARY
Glucose-Capillary: 144 mg/dL — ABNORMAL HIGH (ref 70–99)
Glucose-Capillary: 138 mg/dL — ABNORMAL HIGH (ref 70–99)
Glucose-Capillary: 97 mg/dL (ref 70–99)

## 2012-01-20 LAB — BASIC METABOLIC PANEL
BUN: 13 mg/dL (ref 6–23)
CO2: 26 mEq/L (ref 19–32)
Calcium: 9 mg/dL (ref 8.4–10.5)
Creatinine, Ser: 0.76 mg/dL (ref 0.50–1.35)
GFR calc non Af Amer: 81 mL/min — ABNORMAL LOW (ref >90)
Glucose, Bld: 108 mg/dL — ABNORMAL HIGH (ref 70–99)

## 2012-01-20 LAB — HEMOGLOBIN AND HEMATOCRIT, BLOOD
HCT: 33.1 % — ABNORMAL LOW (ref 39.0–52.0)
Hemoglobin: 12.2 g/dL — ABNORMAL LOW (ref 13.0–17.0)

## 2012-01-20 MED ORDER — PEG-KCL-NACL-NASULF-NA ASC-C 100 G PO SOLR
1.0000 | Freq: Once | ORAL | Status: AC
  Administered 2012-01-20: 100 g via ORAL
  Filled 2012-01-20: qty 1

## 2012-01-20 NOTE — Progress Notes (Signed)
[facility] Progress Note   Subjective: Had 2 "large bright red bloody BMs" this AM.  One prior to Dr. Clelia Croft visit and one right afterwards.  HE otherwise feels completely fine.   Objective: Vital signs in last 24 hours: Temp:  [97.6 F (36.4 C)-98.3 F (36.8 C)] 98.3 F (36.8 C) (03/21 0452) Pulse Rate:  [76-103] 103  (03/21 0456) Resp:  [18-20] 19  (03/21 0452) BP: (117-128)/(74-78) 119/78 mmHg (03/21 0456) SpO2:  [91 %-93 %] 93 % (03/21 0452) Last BM Date: 01/18/12 General: alert and oriented times 3 Heart: regular rate and rythm Abdomen: soft, non-tender, non-distended, normal bowel sounds   Lab Results:  Basename 01/20/12 0530 01/19/12 2045 01/19/12 1250 01/19/12 0500 01/18/12 2324  WBC 7.7 -- -- 7.9 9.4  HGB 12.9* 12.2* 12.9* -- --  PLT 296 -- -- 263 271  MCV 91.2 -- -- 90.7 91.4    Basename 01/20/12 0530 01/19/12 0500 01/18/12 1403  NA 141 139 140  K 3.5 3.7 4.2  CL 105 106 104  CO2 26 25 25   GLUCOSE 108* 97 112*  BUN 13 16 22   CREATININE 0.76 0.69 0.82  CALCIUM 9.0 8.9 9.7    Basename 01/19/12 0500 01/18/12 1403  PROT 5.8* 6.7  ALBUMIN 3.4* 3.9  AST 14 18  ALT 12 14  ALKPHOS 76 94  BILITOT 0.8 0.7  BILIDIR -- --  IBILI -- --    Basename 01/18/12 2324  INR 1.07     Assessment/Plan: 78 y.o. male lower GI bleeding, likely diverticular, seems to be stuttering bleed  Fairly low volume, has not required blood transfusion and Hb still essentially normal.  Will changed to clears and prep him for possible colonoscopy tomorrow, if bleeding stops during prep and does not restart then may not need colonsocopy.  He understands and agrees.  I discussed the split dosing of moviprep with RN.     Rob Bunting, MD  01/20/2012, 8:41 AM [facility] Pager [phone]

## 2012-01-20 NOTE — Progress Notes (Signed)
Subjective: Had a large bloody bowel movement again this am.  Smaller BM with blood last pm.  No abdominal pain.  No lightheadedness, nausea/vomitting.  Objective: Vital signs in last 24 hours: Temp:  [97.6 F (36.4 C)-98.3 F (36.8 C)] 98.3 F (36.8 C) (03/21 0452) Pulse Rate:  [76-103] 103  (03/21 0456) Resp:  [18-20] 19  (03/21 0452) BP: (117-128)/(74-78) 119/78 mmHg (03/21 0456) SpO2:  [91 %-93 %] 93 % (03/21 0452) Weight change:  Last BM Date: 01/18/12  CBG (last 3)   Basename 01/19/12 2224 01/19/12 1633 01/19/12 1155  GLUCAP 95 121* 90    Intake/Output from previous day: 03/20 0701 - 03/21 0700 In: 1590 [P.O.:840; I.V.:750] Out: 1427 [Urine:1425; Stool:2] Intake/Output this shift:    General appearance: alert, no acute distress Eyes: no scleral icterus Throat: oropharynx moist without erythema Resp: clear to auscultation bilaterally Cardio: regular rate and rhythm, S1, S2 normal, no murmur, click, rub or gallop GI: soft, non-tender; bowel sounds normal; no masses,  no organomegaly Extremities: no clubbing, cyanosis or edema   Lab Results:  Basename 01/20/12 0530 01/19/12 0500  NA 141 139  K 3.5 3.7  CL 105 106  CO2 26 25  GLUCOSE 108* 97  BUN 13 16  CREATININE 0.76 0.69  CALCIUM 9.0 8.9  MG -- --  PHOS -- --    Basename 01/19/12 0500 01/18/12 1403  AST 14 18  ALT 12 14  ALKPHOS 76 94  BILITOT 0.8 0.7  PROT 5.8* 6.7  ALBUMIN 3.4* 3.9    Basename 01/20/12 0530 01/19/12 2045 01/19/12 0500 01/18/12 1403  WBC 7.7 -- 7.9 --  NEUTROABS -- -- -- 6.3  HGB 12.9* 12.2* -- --  HCT 39.3 37.0* -- --  MCV 91.2 -- 90.7 --  PLT 296 -- 263 --   Lab Results  Component Value Date   INR 1.07 01/18/2012    Studies/Results: Ct Abdomen Pelvis W Contrast  01/18/2012  *RADIOLOGY REPORT*  Clinical Data: Rectal bleeding.  History of abdominal aortic aneurysm.  CT ABDOMEN AND PELVIS WITH CONTRAST  Technique:  Multidetector CT imaging of the abdomen and pelvis was  performed following the standard protocol during bolus administration of intravenous contrast.  Contrast: OMNIPAQUE IOHEXOL 300 MG/ML IJ SOLN  Comparison: No priors.  Findings:  Lung Bases: Calcifications in the distal right coronary artery. Small hiatal hernia.  Abdomen/Pelvis:  Small amount of high attenuation material layering dependently in the gallbladder, suggestive of biliary sludge and/or small gallstones.  However, the gallbladder is not significantly distended, nor is there significant pericholecystic fluid or stranding at this time to suggest the presence of an acute cholecystitis.  The enhanced appearance of the liver, pancreas, spleen, bilateral adrenal glands and right kidney are unremarkable. There are multiple subcentimeter low attenuation lesions in the left kidney which is too small to definitively characterize.  In addition, there is a exophytic 1.4 cm intermediate attenuation lesion extending off the anterior aspect of the upper pole of the left kidney which may have some internal soft tissue or enhancement.  A fusiform infrarenal abdominal aortic aneurysm is noted measuring up to 4.2 cm in diameter.  There is a small amount of these centric nonenhancing material in the sac of the aneurysm, likely represent mural thrombus and/or atheromatous plaque.  More proximally within the aorta, at the level of the hiatus, there is an ulcerated plaque.  No penetrating aortic ulcer or dissection is noted at this time.  The iliac vasculature is remarkable for  moderate atherosclerosis, without evidence of aneurysm or dissection.  Normal retrocecal appendix.  There are numerous colonic diverticula, predominately of the distal descending colon and sigmoid colon, without surrounding inflammatory changes at this time to suggest an acute diverticulitis.  There is no ascites or pneumoperitoneum and no pathologic distension of bowel.  No definite pathologic adenopathy identified within the abdomen or pelvis.   Multiple calcifications within the prostate gland (which is only mildly enlarged).  Urinary bladder is unremarkable.  Musculoskeletal: There are no aggressive appearing lytic or blastic lesions noted in the visualized portions of the skeleton.  Mild compression fracture of superior endplate of L4 with less than 10% loss of vertebral body height, which appears chronic.  IMPRESSION: 1.  Colonic diverticulosis without findings to suggest acute diverticulitis. This may be a source of rectal bleeding in this patient. 2.  4.2 cm fusiform infrarenal abdominal aortic aneurysm, as above. 3.  Small amount of biliary sludge and/or small gallstones layering dependently within the gallbladder lumen.  No findings to suggest acute cholecystitis at this time. 4.  Multiple subcentimeter low attenuation lesions in the left kidney are too small to definitively characterize.  In addition, however, there is a 1.4 cm intermediate attenuation lesion extending  exophytically from the upper pole of the left kidney which may have some internal soft tissue or enhancement.  Further evaluation with non emergent renal ultrasound is recommended to exclude underlying neoplasm. 5.  Small hiatal hernia. 6. Atherosclerosis, including right coronary artery disease. Please note that although the presence of coronary artery calcium documents the presence of coronary artery disease, the severity of this disease and any potential stenosis cannot be assessed on this non-gated CT examination.  Assessment for potential risk factor modification, dietary therapy or pharmacologic therapy may be warranted, if clinically indicated.  Original Report Authenticated By: Florencia Reasons, M.D.     Medications: Scheduled:   . insulin aspart  0-9 Units Subcutaneous TID WC  . pantoprazole  40 mg Oral Q1200   Continuous:   . sodium chloride 75 mL/hr at 01/19/12 0201    Assessment/Plan: Principal Problem: 1. *Rectal bleeding- recurrent hematochezia.   Continue to monitor Hg every 8 hours and transfuse if Hg less than 9-10 (relatively stable since admission) and vitals.  Defer ?further evaluation to GI.   Active Problems: 2. GERD- continue PPI 3. COLONIC POLYPS, ADENOMATOUS, HX OF 4. Impaired fasting glucose- continue SSI. 5. Diverticulosis 6. Hypertension- improved.  Not on any anti-HTN therapy. 7. Disposition- possible discharge in 1-2 days if bleeding stabilizes.      LOS: 2 days   Tenya Araque,W DOUGLAS 01/20/2012, 7:41 AM

## 2012-01-21 DIAGNOSIS — K625 Hemorrhage of anus and rectum: Secondary | ICD-10-CM | POA: Diagnosis not present

## 2012-01-21 DIAGNOSIS — D649 Anemia, unspecified: Secondary | ICD-10-CM | POA: Diagnosis not present

## 2012-01-21 DIAGNOSIS — K5731 Diverticulosis of large intestine without perforation or abscess with bleeding: Secondary | ICD-10-CM | POA: Diagnosis not present

## 2012-01-21 DIAGNOSIS — I10 Essential (primary) hypertension: Secondary | ICD-10-CM | POA: Diagnosis not present

## 2012-01-21 DIAGNOSIS — E876 Hypokalemia: Secondary | ICD-10-CM | POA: Diagnosis not present

## 2012-01-21 DIAGNOSIS — K5730 Diverticulosis of large intestine without perforation or abscess without bleeding: Secondary | ICD-10-CM | POA: Diagnosis not present

## 2012-01-21 LAB — BASIC METABOLIC PANEL
BUN: 12 mg/dL (ref 6–23)
Creatinine, Ser: 0.71 mg/dL (ref 0.50–1.35)
GFR calc Af Amer: >90 mL/min (ref >90)
GFR calc non Af Amer: 84 mL/min — ABNORMAL LOW (ref >90)
Potassium: 3.1 mEq/L — ABNORMAL LOW (ref 3.5–5.1)

## 2012-01-21 LAB — HEMOGLOBIN AND HEMATOCRIT, BLOOD
HCT: 30.4 % — ABNORMAL LOW (ref 39.0–52.0)
Hemoglobin: 10 g/dL — ABNORMAL LOW (ref 13.0–17.0)

## 2012-01-21 LAB — CBC
Hemoglobin: 10.1 g/dL — ABNORMAL LOW (ref 13.0–17.0)
MCHC: 32.9 g/dL (ref 30.0–36.0)
RDW: 13.9 % (ref 11.5–15.5)
WBC: 8.3 10*3/uL (ref 4.0–10.5)

## 2012-01-21 MED ORDER — OMEPRAZOLE MAGNESIUM 20 MG PO TBEC: 20.0000 mg | DELAYED_RELEASE_TABLET | Freq: Every day | ORAL | Status: AC

## 2012-01-21 NOTE — Progress Notes (Signed)
 Gastroenterology Progress Note   Subjective: Completed split dose prep.  He was told there was no blood in stools overnight and this AM I looked in bedside commode with RN and there was yellow to brown colored thin liquid only.  No blood.  He feels fine.   Objective: Vital signs in last 24 hours: Temp:  [97.5 F (36.4 C)-98.2 F (36.8 C)] 98.1 F (36.7 C) (03/22 6295) Pulse Rate:  [94-107] 104  (03/22 0634) Resp:  [18-20] 18  (03/22 2841) BP: (131-149)/(72-83) 149/83 mmHg (03/22 0634) SpO2:  [93 %-98 %] 94 % (03/22 0633) Last BM Date: 01/20/12 General: alert and oriented times 3 Heart: regular rate and rythm Abdomen: soft, non-tender, non-distended, normal bowel sounds  Lab Results:  Basename 01/21/12 0459 01/20/12 2145 01/20/12 1250 01/20/12 0530 01/19/12 0500  WBC 8.3 -- -- 7.7 7.9  HGB 10.1* 10.9* 12.2* -- --  PLT 264 -- -- 296 263  MCV 91.1 -- -- 91.2 90.7    Basename 01/21/12 0459 01/20/12 0530 01/19/12 0500  NA 140 141 139  K 3.1* 3.5 3.7  CL 109 105 106  CO2 20 26 25   GLUCOSE 113* 108* 97  BUN 12 13 16   CREATININE 0.71 0.76 0.69  CALCIUM 8.6 9.0 8.9    Basename 01/19/12 0500 01/18/12 1403  PROT 5.8* 6.7  ALBUMIN 3.4* 3.9  AST 14 18  ALT 12 14  ALKPHOS 76 94  BILITOT 0.8 0.7  BILIDIR -- --  IBILI -- --    Basename 01/18/12 2324  INR 1.07     Assessment/Plan: 78 y.o. male  With lower GI bleeding, likely diverticular, has stopped bleeding with prep overnight  Did not require blood transfusion, so pretty low overall volume bleed.  Hb has trended down, I suspect from blood loss and fluids.  OK to d/c home later today.  He can restart ASA in 2-3 days.      Rob Bunting, MD  01/21/2012, 6:36 AM [facility] Pager [phone]

## 2012-01-21 NOTE — Discharge Instructions (Signed)
Bloody Stools Bloody stools often mean that there is a problem in the digestive tract. Your caregiver may use the term "melena" to describe black, tarry, and bad smelling stools or "hematochezia" to describe red or maroon-colored stools. Blood seen in the stool can be caused by bleeding anywhere along the intestinal tract.  A black stool usually means that blood is coming from the upper part of the gastrointestinal tract (esophagus, stomach, or small bowel). Passing maroon-colored stools or bright red blood usually means that blood is coming from lower down in the large bowel or the rectum. However, sometimes massive bleeding in the stomach or small intestine can cause bright red bloody stools.  Consuming black licorice, lead, iron pills, medicines containing bismuth subsalicylate, or blueberries can also cause black stools. Your caregiver can test black stools to see if blood is present. It is important that the cause of the bleeding be found. Treatment can then be started, and the problem can be corrected. Rectal bleeding may not be serious, but you should not assume everything is okay until you know the cause.It is very important to follow up with your caregiver or a specialist in gastrointestinal problems. CAUSES  Blood in the stools can come from various underlying causes.Often, the cause is not found during your first visit. Testing is often needed to discover the cause of bleeding in the gastrointestinal tract. Causes range from simple to serious or even life-threatening.Possible causes include:  Hemorrhoids.These are veins that are full of blood (engorged) in the rectum. They cause pain, inflammation, and may bleed.   Anal fissures.These are areas of painful tearing which may bleed. They are often caused by passing hard stool.   Diverticulosis.These are pouches that form on the colon over time, with age, and may bleed significantly.   Diverticulitis.This is inflammation in areas with  diverticulosis. It can cause pain, fever, and bloody stools, although bleeding is rare.   Proctitis and colitis. These are inflamed areas of the rectum or colon. They may cause pain, fever, and bloody stools.   Polyps and cancer. Colon cancer is a leading cause of preventable cancer death.It often starts out as precancerous polyps that can be removed during a colonoscopy, preventing progression into cancer. Sometimes, polyps and cancer may cause rectal bleeding.   Gastritis and ulcers.Bleeding from the upper gastrointestinal tract (near the stomach) may travel through the intestines and produce black, sometimes tarry, often bad smelling stools. In certain cases, if the bleeding is fast enough, the stools may not be black, but red and the condition may be life-threatening.  SYMPTOMS  You may have stools that are bright red and bloody, that are normal color with blood on them, or that are dark black and tarry. In some cases, you may only have blood in the toilet bowl. Any of these cases need medical care. You may also have:  Pain at the anus or anywhere in the rectum.   Lightheadedness or feeling faint.   Extreme weakness.   Nausea or vomiting.   Fever.  DIAGNOSIS Your caregiver may use the following methods to find the cause of your bleeding:  Taking a medical history. Age is important. Older people tend to develop polyps and cancer more often. If there is anal pain and a hard, large stool associated with bleeding, a tear of the anus may be the cause. If blood drips into the toilet after a bowel movement, bleeding hemorrhoids may be the problem. The color and frequency of the bleeding are additional considerations.   In most cases, the medical history provides clues, but seldom the final answer.   A visual and finger (digital) exam. Your caregiver will inspect the anal area, looking for tears and hemorrhoids. A finger exam can provide information when there is tenderness or a growth inside. In  men, the prostate is also examined.   Endoscopy. Several types of small, long scopes (endoscopes) are used to view the colon.   In the office, your caregiver may use a rigid, or more commonly, a flexible viewing sigmoidoscope. This exam is called flexible sigmoidoscopy. It is performed in 5 to 10 minutes.   A more thorough exam is accomplished with a colonoscope. It allows your caregiver to view the entire 5 to 6 foot long colon. Medicine to help you relax (sedative) is usually given for this exam. Frequently, a bleeding lesion may be present beyond the reach of the sigmoidoscope. So, a colonoscopy may be the best exam to start with. Both exams are usually done on an outpatient basis. This means the patient does not stay overnight in the hospital or surgery center.   An upper endoscopy may be needed to examine your stomach. Sedation is used and a flexible endoscope is put in your mouth, down to your stomach.   A barium enema X-ray. This is an X-ray exam. It uses liquid barium inserted by enema into the rectum. This test alone may not identify an actual bleeding point. X-rays highlight abnormal shadows, such as those made by lumps (tumors), diverticuli, or colitis.  TREATMENT  Treatment depends on the cause of your bleeding.   For bleeding from the stomach or colon, the caregiver doing your endoscopy or colonoscopy may be able to stop the bleeding as part of the procedure.   Inflammation or infection of the colon can be treated with medicines.   Many rectal problems can be treated with creams, suppositories, or warm baths.   Surgery is sometimes needed.   Blood transfusions are sometimes needed if you have lost a lot of blood.   For any bleeding problem, let your caregiver know if you take aspirin or other blood thinners regularly.  HOME CARE INSTRUCTIONS   Take any medicines exactly as prescribed.   Keep your stools soft by eating a diet high in fiber. Prunes (1 to 3 a day) work well for  many people.   Drink enough water and fluids to keep your urine clear or pale yellow.   Take sitz baths if advised. A sitz bath is when you sit in a bathtub with warm water for 10 to 15 minutes to soak, soothe, and cleanse the rectal area.   If enemas or suppositories are advised, be sure you know how to use them. Tell your caregiver if you have problems with this.   Monitor your bowel movements to look for signs of improvement or worsening.  SEEK MEDICAL CARE IF:   You do not improve in the time expected.   Your condition worsens after initial improvement.   You develop any new symptoms.  SEEK IMMEDIATE MEDICAL CARE IF:   You develop severe or prolonged rectal bleeding.   You vomit blood.   You feel weak or faint.   You have a fever.  MAKE SURE YOU:  Understand these instructions.   Will watch your condition.   Will get help right away if you are not doing well or get worse.  Document Released: 10/08/2002 Document Revised: 10/07/2011 Document Reviewed: 03/05/2011 [facility] Patient Information 2012  [place], [place].Diverticulosis Diverticulosis  is a common condition that develops when small pouches (diverticula) form in the wall of the colon. The risk of diverticulosis increases with age. It happens more often in people who eat a low-fiber diet. Most individuals with diverticulosis have no symptoms. Those individuals with symptoms usually experience abdominal pain, constipation, or loose stools (diarrhea). HOME CARE INSTRUCTIONS   Increase the amount of fiber in your diet as directed by your caregiver or dietician. This may reduce symptoms of diverticulosis.   Your caregiver may recommend taking a dietary fiber supplement.   Drink at least 6 to 8 glasses of water each day to prevent constipation.   Try not to strain when you have a bowel movement.   Your caregiver may recommend avoiding nuts and seeds to prevent complications, although this is still an uncertain benefit.     Only take over-the-counter or prescription medicines for pain, discomfort, or fever as directed by your caregiver.  FOODS WITH HIGH FIBER CONTENT INCLUDE:  Fruits. Apple, peach, pear, tangerine, raisins, prunes.   Vegetables. Brussels sprouts, asparagus, broccoli, cabbage, carrot, cauliflower, romaine lettuce, spinach, summer squash, tomato, winter squash, zucchini.   Starchy Vegetables. Baked beans, kidney beans, lima beans, split peas, lentils, potatoes (with skin).   Grains. Whole wheat bread, brown rice, bran flake cereal, plain oatmeal, white rice, shredded wheat, bran muffins.  SEEK IMMEDIATE MEDICAL CARE IF:   You develop increasing pain or severe bloating.   You have an oral temperature above 102 F (38.9 C), not controlled by medicine.   You develop vomiting or bowel movements that are bloody or black.  Document Released: 07/15/2004 Document Revised: 10/07/2011 Document Reviewed: 03/18/2010 [facility] Patient Information 2012 [place], [place].

## 2012-01-21 NOTE — Discharge Summary (Signed)
DISCHARGE SUMMARY  Carl Rios  MR#: [id number]  DOB:1925-12-05  Date of Admission: 01/18/2012 Date of Discharge: 01/21/2012  Attending Physician:Carl Rios,Carl Rios  Patient's WUJ:[name],[name], MD, MD  Consults:  Carl Fee, MD-GI  Discharge Diagnoses: Principal Problem:  *Rectal bleeding Active Problems:  GERD  COLONIC POLYPS, ADENOMATOUS, HX OF  Impaired fasting glucose  Diverticulosis  Hypertension  Past Medical History   Diagnosis  Date   .  AAA (abdominal aortic aneurysm) without rupture    hyperlipidemia  Impaired fasting glucose  Peripheral neuropathy  Obesity  Gastroesophageal reflux disease  History of acute prostatitis  Macula degeneration  Overactive bladder   Past Surgical History   Procedure  Date   .  Knee surgery      left knee in 1970's   strip hernia repair  History of undescended testicle 1949  Knee cartilage removal 1965  Esophageal dilatation 2005  Status post bilateral cataract repair in 2010  Discharge Medications: Medication List  As of 01/21/2012  7:45 AM   STOP taking these medications         aspirin EC 81 MG tablet         TAKE these medications         CALCIUM + D PO   Take 1 tablet by mouth daily.      multivitamin-lutein Caps   Take 1 capsule by mouth daily.      omeprazole 20 MG tablet   Commonly known as: PRILOSEC OTC   Take 1 tablet (20 mg total) by mouth daily.            Hospital Procedures: Ct Abdomen Pelvis Carl Contrast 01/18/2012  *RADIOLOGY REPORT*  Clinical Data: Rectal bleeding.  History of abdominal aortic aneurysm.  CT ABDOMEN AND PELVIS WITH CONTRAST  Technique:  Multidetector CT imaging of the abdomen and pelvis was performed following the standard protocol during bolus administration of intravenous contrast.  Contrast: OMNIPAQUE IOHEXOL 300 MG/ML IJ SOLN  Comparison: No priors.  Findings:  Lung Bases: Calcifications in the distal right coronary artery. Small hiatal hernia.  Abdomen/Pelvis:   Small amount of high attenuation material layering dependently in the gallbladder, suggestive of biliary sludge and/or small gallstones.  However, the gallbladder is not significantly distended, nor is there significant pericholecystic fluid or stranding at this time to suggest the presence of an acute cholecystitis.  The enhanced appearance of the liver, pancreas, spleen, bilateral adrenal glands and right kidney are unremarkable. There are multiple subcentimeter low attenuation lesions in the left kidney which is too small to definitively characterize.  In addition, there is a exophytic 1.4 cm intermediate attenuation lesion extending off the anterior aspect of the upper pole of the left kidney which may have some internal soft tissue or enhancement.  A fusiform infrarenal abdominal aortic aneurysm is noted measuring up to 4.2 cm in diameter.  There is a small amount of these centric nonenhancing material in the sac of the aneurysm, likely represent mural thrombus and/or atheromatous plaque.  More proximally within the aorta, at the level of the hiatus, there is an ulcerated plaque.  No penetrating aortic ulcer or dissection is noted at this time.  The iliac vasculature is remarkable for moderate atherosclerosis, without evidence of aneurysm or dissection.  Normal retrocecal appendix.  There are numerous colonic diverticula, predominately of the distal descending colon and sigmoid colon, without surrounding inflammatory changes at this time to suggest an acute diverticulitis.  There is no ascites or pneumoperitoneum and no  pathologic distension of bowel.  No definite pathologic adenopathy identified within the abdomen or pelvis.  Multiple calcifications within the prostate gland (which is only mildly enlarged).  Urinary bladder is unremarkable.  Musculoskeletal: There are no aggressive appearing lytic or blastic lesions noted in the visualized portions of the skeleton.  Mild compression fracture of superior  endplate of L4 with less than 10% loss of vertebral body height, which appears chronic.  IMPRESSION: 1.  Colonic diverticulosis without findings to suggest acute diverticulitis. This may be a source of rectal bleeding in this patient. 2.  4.2 cm fusiform infrarenal abdominal aortic aneurysm, as above. 3.  Small amount of biliary sludge and/or small gallstones layering dependently within the gallbladder lumen.  No findings to suggest acute cholecystitis at this time. 4.  Multiple subcentimeter low attenuation lesions in the left kidney are too small to definitively characterize.  In addition, however, there is a 1.4 cm intermediate attenuation lesion extending  exophytically from the upper pole of the left kidney which may have some internal soft tissue or enhancement.  Further evaluation with non emergent renal ultrasound is recommended to exclude underlying neoplasm. 5.  Small hiatal hernia. 6. Atherosclerosis, including right coronary artery disease. Please note that although the presence of coronary artery calcium documents the presence of coronary artery disease, the severity of this disease and any potential stenosis cannot be assessed on this non-gated CT examination.  Assessment for potential risk factor modification, dietary therapy or pharmacologic therapy may be warranted, if clinically indicated.  Original Report Authenticated By: Carl Rios, M.D.    History of Present Illness: This is an 78 year old male, previously was along Hospital, retired, with a past medical history significant for hyperlipidemia, impaired fasting glucose, peripheral neuropathy, reflux and an overactive bladder who presents with his first case of painless rectal bleeding. This started for the first time this morning after arising to urinate. Patient denies any presyncope, abdominal discomfort, nausea, vomiting, fevers, chills, further denies any chest pain, shortness of breath. Called her office and was instructed to go  to the emergency room for further evaluation and management. He does have recently diagnosed history of a AAA. He was hemodynamically stable in the emergency room with a hemoglobin that was normal. Had no further episodes initially of rectal bleeding, underwent CT scan with results as dictated below but significant for a stable AAA, and colonic diverticula extending into the sigmoid colon. No evidence of diverticulitis. Later in the day, the patient had another episode of large-volume rectal bleeding by his account, not visualized by any of the staff however emergency room physician did take knowledge that he had a rectal fissure, external hemorrhoid and was quite positive based on exam. He is now being admitted for further evaluation and management of probable diverticular bleeding with GI consultation.   Hospital Course: Mr. Carl Rios was admitted to a telemetry bed. He was monitored closely hemodynamically in with frequent hemoglobin checks. He continued to have intermittent bloody stools until about 24 hours prior to discharge.  His hemoglobin drifted down from 14.9 on admission to 10.1 prior to discharge. However, he did not require transfusion and has remained hemodynamically stable. Gastroenterology was consulted and recommended a bowel prep for possible colonoscopy. With the bowel prep his stools cleared and he had no further bleeding and felt that his bleeding was most consistent with diverticular bleed. Given resolution of his rectal bleeding gastroenterology did not recommend proceeding with the colonoscopy. They felt that he was stable for discharge home.  He will be monitored through the day today with a regular diet and ambulate with assistance in the hall. If he is doing okay with this he will be discharged home later today. His aspirin has been held and he will not resume this until his followup in office.  Day of Discharge Exam BP 135/87  Pulse 112  Temp(Src) 98.1 F (36.7 C) (Oral)  Resp 18   Ht 5\' 8"  (1.727 m)  Wt 89.6 kg (197 lb 8.5 oz)  BMI 30.03 kg/m2  SpO2 94%  Physical Exam: General appearance: alert and no distress Eyes: no scleral icterus Throat: oropharynx moist without erythema Resp: clear to auscultation bilaterally Cardio: regular rate and rhythm, S1, S2 normal, no murmur, click, rub or gallop GI: soft, non-tender; bowel sounds normal; no masses,  no organomegaly Extremities: no clubbing, cyanosis or edema  Discharge Labs:  [facility] 01/21/12 0459 01/20/12 0530  NA 140 141  K 3.1* 3.5  CL 109 105  CO2 20 26  GLUCOSE 113* 108*  BUN 12 13  CREATININE 0.71 0.76  CALCIUM 8.6 9.0  MG -- --  PHOS -- --    Basename 01/19/12 0500 01/18/12 1403  AST 14 18  ALT 12 14  ALKPHOS 76 94  BILITOT 0.8 0.7  PROT 5.8* 6.7  ALBUMIN 3.4* 3.9    Basename 01/21/12 0459 01/20/12 2145 01/20/12 0530 01/18/12 1403  WBC 8.3 -- 7.7 --  NEUTROABS -- -- -- 6.3  HGB 10.1* 10.9* -- --  HCT 30.7* 33.1* -- --  MCV 91.1 -- 91.2 --  PLT 264 -- 296 --   Lab Results  Component Value Date   INR 1.07 01/18/2012    Discharge instructions: Discharge Orders    Future Appointments: Provider: Department: Dept Phone: Center:   02/01/2012 1:15 PM Pryor Ochoa, MD [facility] [phone] [facility]      Disposition: To home  Follow-up Appts: Follow-up with Dr. Clelia Rios at [facility] in 1 week.  We will call for an appointment.  Condition on Discharge: Improved, stable  Tests Needing Follow-up: Repeat CBC at hospital followup visit   Signed: Shaniece Rios,Carl Rios 01/21/2012, 7:45 AM

## 2012-01-21 NOTE — Progress Notes (Signed)
Pt has finished all but approximately 1/2 cup of his Moviprep. Stools remain dark brown/bloody in color and very loose in consistency. Ginny Forth

## 2012-01-23 ENCOUNTER — Encounter (HOSPITAL_COMMUNITY): Payer: Self-pay | Admitting: Internal Medicine

## 2012-01-23 ENCOUNTER — Inpatient Hospital Stay (HOSPITAL_COMMUNITY)
Admission: AD | Admit: 2012-01-23 | Discharge: 2012-01-26 | DRG: 378 | Disposition: A | Discharge status: Home / Self Care | Payer: Medicare Other | Source: Ambulatory Visit | Attending: Internal Medicine | Admitting: Internal Medicine

## 2012-01-23 DIAGNOSIS — K5731 Diverticulosis of large intestine without perforation or abscess with bleeding: Principal | ICD-10-CM | POA: Diagnosis present

## 2012-01-23 DIAGNOSIS — K219 Gastro-esophageal reflux disease without esophagitis: Secondary | ICD-10-CM | POA: Diagnosis present

## 2012-01-23 DIAGNOSIS — E876 Hypokalemia: Secondary | ICD-10-CM | POA: Diagnosis not present

## 2012-01-23 DIAGNOSIS — I10 Essential (primary) hypertension: Secondary | ICD-10-CM | POA: Diagnosis present

## 2012-01-23 DIAGNOSIS — K5790 Diverticulosis of intestine, part unspecified, without perforation or abscess without bleeding: Secondary | ICD-10-CM | POA: Diagnosis present

## 2012-01-23 DIAGNOSIS — D126 Benign neoplasm of colon, unspecified: Secondary | ICD-10-CM | POA: Diagnosis present

## 2012-01-23 DIAGNOSIS — D62 Acute posthemorrhagic anemia: Secondary | ICD-10-CM | POA: Diagnosis present

## 2012-01-23 DIAGNOSIS — D649 Anemia, unspecified: Secondary | ICD-10-CM | POA: Diagnosis not present

## 2012-01-23 DIAGNOSIS — Z860100 Personal history of colon polyps, unspecified: Secondary | ICD-10-CM

## 2012-01-23 DIAGNOSIS — R7301 Impaired fasting glucose: Secondary | ICD-10-CM | POA: Diagnosis present

## 2012-01-23 DIAGNOSIS — K626 Ulcer of anus and rectum: Secondary | ICD-10-CM | POA: Diagnosis present

## 2012-01-23 DIAGNOSIS — K625 Hemorrhage of anus and rectum: Secondary | ICD-10-CM | POA: Diagnosis present

## 2012-01-23 DIAGNOSIS — K5730 Diverticulosis of large intestine without perforation or abscess without bleeding: Secondary | ICD-10-CM | POA: Diagnosis not present

## 2012-01-23 DIAGNOSIS — D509 Iron deficiency anemia, unspecified: Secondary | ICD-10-CM | POA: Diagnosis not present

## 2012-01-23 LAB — CBC
HCT: 24.1 % — ABNORMAL LOW (ref 39.0–52.0)
Hemoglobin: 8 g/dL — ABNORMAL LOW (ref 13.0–17.0)
MCH: 30.9 pg (ref 26.0–34.0)
MCHC: 33.2 g/dL (ref 30.0–36.0)
RBC: 2.59 MIL/uL — ABNORMAL LOW (ref 4.22–5.81)

## 2012-01-23 LAB — BASIC METABOLIC PANEL
BUN: 19 mg/dL (ref 6–23)
CO2: 25 mEq/L (ref 19–32)
GFR calc non Af Amer: 86 mL/min — ABNORMAL LOW (ref >90)
Glucose, Bld: 111 mg/dL — ABNORMAL HIGH (ref 70–99)
Potassium: 4 mEq/L (ref 3.5–5.1)

## 2012-01-23 MED ORDER — CALCIUM CARBONATE-VITAMIN D 600-200 MG-UNIT PO TABS: ORAL_TABLET | Freq: Two times a day (BID) | ORAL | Status: DC

## 2012-01-23 MED ORDER — ACETAMINOPHEN 325 MG PO TABS
650.0000 mg | ORAL_TABLET | Freq: Once | ORAL | Status: AC
  Administered 2012-01-23: 650 mg via ORAL
  Filled 2012-01-23: qty 2

## 2012-01-23 MED ORDER — PEG 3350-KCL-NA BICARB-NACL 420 G PO SOLR
4000.0000 mL | Freq: Once | ORAL | Status: AC
  Administered 2012-01-24: 4000 mL via ORAL

## 2012-01-23 MED ORDER — SODIUM CHLORIDE 0.9 % IV SOLN
INTRAVENOUS | Status: DC
  Administered 2012-01-23 – 2012-01-25 (×5): via INTRAVENOUS

## 2012-01-23 MED ORDER — PANTOPRAZOLE SODIUM 40 MG PO TBEC
40.0000 mg | DELAYED_RELEASE_TABLET | Freq: Every day | ORAL | Status: DC
  Administered 2012-01-23 – 2012-01-26 (×3): 40 mg via ORAL
  Filled 2012-01-23 (×4): qty 1

## 2012-01-23 MED ORDER — HYDROCODONE-ACETAMINOPHEN 5-325 MG PO TABS: 1.0000 | ORAL_TABLET | ORAL | Status: DC | PRN

## 2012-01-23 MED ORDER — CALCIUM CARBONATE-VITAMIN D 500-200 MG-UNIT PO TABS
1.0000 | ORAL_TABLET | Freq: Two times a day (BID) | ORAL | Status: DC
  Administered 2012-01-24 – 2012-01-26 (×4): 1 via ORAL
  Filled 2012-01-23 (×7): qty 1

## 2012-01-23 MED ORDER — DIPHENHYDRAMINE HCL 25 MG PO CAPS
25.0000 mg | ORAL_CAPSULE | Freq: Once | ORAL | Status: AC
  Administered 2012-01-23: 25 mg via ORAL
  Filled 2012-01-23: qty 1

## 2012-01-23 MED ORDER — OMEPRAZOLE MAGNESIUM 20 MG PO TBEC: 20.0000 mg | DELAYED_RELEASE_TABLET | Freq: Every day | ORAL | Status: DC

## 2012-01-23 MED ORDER — OCUVITE-LUTEIN PO CAPS
1.0000 | ORAL_CAPSULE | Freq: Every day | ORAL | Status: DC
  Administered 2012-01-25 – 2012-01-26 (×2): 1 via ORAL
  Filled 2012-01-23 (×3): qty 1

## 2012-01-23 MED ORDER — SODIUM CHLORIDE 0.9 % IV BOLUS (SEPSIS)
2000.0000 mL | Freq: Once | INTRAVENOUS | Status: AC
  Administered 2012-01-23: 2000 mL via INTRAVENOUS

## 2012-01-23 MED ORDER — ZOLPIDEM TARTRATE 5 MG PO TABS: 5.0000 mg | ORAL_TABLET | Freq: Every evening | ORAL | Status: DC | PRN

## 2012-01-23 MED ORDER — ACETAMINOPHEN 500 MG PO TABS: 1000.0000 mg | ORAL_TABLET | Freq: Four times a day (QID) | ORAL | Status: DC | PRN

## 2012-01-23 NOTE — Consult Note (Signed)
Reason for Consult:Rectal bleeding and anemia. Cross cover [employer] Referring Physician: Bertell Maria. MD  Carl Rios is an 78 y.o. male.  HPI: Carl Rios gives a 7 day history of intermittent painless BRBPR. He was discharged yesterday after being admitted for similar complaints. He was seen in consultation by Dr. Gladys Damme and as he hada colonoscopy in 2011, plans were to hold off on a procedure at this time. Patient had recurrent rectal bleeding last night with 4 BM's which prompted his readmission. Patient does not want to leave the hospital this time, without having his colonoscopy. He denies any abdominal pain, nausea, vomiting, dysphagia or odynophagia. There is no history of ulcers, jaundice or colitis. He has occasional bouts of constipation and reflux. Past Medical History  Diagnosis Date  . AAA (abdominal aortic aneurysm) without rupture   . Hyperlipidemia   . Diverticulosis   . Peripheral neuropathy   . GERD (gastroesophageal reflux disease)   . Hx of adenomatous colonic polyps    Obesity  Past Surgical History  Procedure Date  . Knee surgery     left knee in 1970's   History reviewed. No pertinent family history.  Social History:  reports that he quit smoking about 53 years ago. He has never used smokeless tobacco. He reports that he drinks about 1.5 ounces of alcohol per week. He reports that he does not use illicit drugs.  Allergies:  Allergies  Allergen Reactions  . Sulfamethoxazole Other (See Comments)    UNKNOWN   Medications: I have reviewed the patient's current medications.  Results for orders placed during the hospital encounter of 01/23/12 (from the past 48 hour(s))  TYPE AND SCREEN     Status: Normal (Preliminary result)   Collection Time   01/23/12  3:05 PM      Component Value Range Comment   ABO/RH(D) B POS      Antibody Screen NEG      Sample Expiration 01/26/2012      Unit Number [id number]      Blood Component Type RED CELLS,LR      Unit  division 00      Status of Unit ALLOCATED      Transfusion Status OK TO TRANSFUSE      Crossmatch Result Compatible     CBC     Status: Abnormal   Collection Time   01/23/12  3:35 PM      Component Value Range Comment   WBC 10.7 (*) 4.0 - 10.5 (K/uL)    RBC 2.59 (*) 4.22 - 5.81 (MIL/uL)    Hemoglobin 8.0 (*) 13.0 - 17.0 (g/dL)    HCT 40.9 (*) 81.1 - 52.0 (%)    MCV 93.1  78.0 - 100.0 (fL)    MCH 30.9  26.0 - 34.0 (pg)    MCHC 33.2  30.0 - 36.0 (g/dL)    RDW 91.4  78.2 - 95.6 (%)    Platelets 283  150 - 400 (K/uL)   BASIC METABOLIC PANEL     Status: Abnormal   Collection Time   01/23/12  3:35 PM      Component Value Range Comment   Sodium 136  135 - 145 (mEq/L)    Potassium 4.0  3.5 - 5.1 (mEq/L)    Chloride 103  96 - 112 (mEq/L)    CO2 25  19 - 32 (mEq/L)    Glucose, Bld 111 (*) 70 - 99 (mg/dL)    BUN 19  6 - 23 (mg/dL)  Creatinine, Ser 0.67  0.50 - 1.35 (mg/dL)    Calcium 8.8  8.4 - 10.5 (mg/dL)    GFR calc non Af Amer 86 (*) >90 (mL/min)    GFR calc Af Amer >90  >90 (mL/min)   PROTIME-INR     Status: Normal   Collection Time   01/23/12  3:35 PM      Component Value Range Comment   Prothrombin Time 14.1  11.6 - 15.2 (seconds)    INR 1.07  0.00 - 1.49      No results found.  Review of Systems  Constitutional: Positive for diaphoresis. Negative for fever, chills, weight loss and malaise/fatigue.  HENT: Negative for hearing loss, ear pain, nosebleeds, congestion, sore throat, tinnitus and ear discharge.   Eyes: Negative for blurred vision, double vision, photophobia, pain and discharge.  Respiratory: Negative.  Negative for hemoptysis, sputum production and stridor.   Cardiovascular: Positive for palpitations. Negative for chest pain, orthopnea, claudication, leg swelling and PND.  Gastrointestinal: Positive for heartburn, constipation and blood in stool. Negative for nausea, vomiting, abdominal pain, diarrhea and melena.  Genitourinary: Negative.   Musculoskeletal:  Negative.   Skin: Negative for itching and rash.  Neurological: Positive for dizziness and weakness. Negative for tingling, tremors, sensory change, speech change, focal weakness, seizures, loss of consciousness and headaches.  Endo/Heme/Allergies: Negative.   Psychiatric/Behavioral: Positive for memory loss. Negative for depression, suicidal ideas, hallucinations and substance abuse. The patient is not nervous/anxious and does not have insomnia.    Blood pressure 104/57, pulse 120, temperature 97.5 F (36.4 C), temperature source Oral, resp. rate 18, height 5\' 10"  (1.778 m), weight 91.173 kg (201 lb), SpO2 96.00%. Physical Exam  Constitutional: He appears well-developed and well-nourished.  HENT:  Head: Normocephalic and atraumatic.  Eyes: Conjunctivae and EOM are normal.  Neck: Normal range of motion.  Cardiovascular: Normal rate and regular rhythm.   Respiratory: Effort normal and breath sounds normal.  GI: Soft. Bowel sounds are normal. He exhibits no distension and no mass. There is no tenderness. There is no rebound and no guarding.  Musculoskeletal: Normal range of motion.  Neurological: He is alert.  Skin: Skin is warm and dry.  Psychiatric: He has a normal mood and affect. His behavior is normal. Judgment and thought content normal.    Assessment/Plan: 1) Rectal bleeding with anemia: will prep for a colonoscopy in AM. I will give him 2L of Nacl 0.9% tonight as well.  2) Diverticulosis; Patient in all probability is having a diverticular bleed.  3) Acid reflux: On Pantoprazole.  4) History of adenomatous polyps.    5) HTN.  6) Idiopathic peripheral neuropathy. 7) Impaired fasting glucose.   8) AAA.  Carl Rios 01/23/2012, 7:22 PM

## 2012-01-23 NOTE — H&P (Signed)
PCP:   Kari Baars, MD, MD   Chief Complaint:  Blood in stool  HPI: Carl Rios is an 78 year old white male with a history of diverticulosis who was directly admitted for recurrent bright red blood per rectum.  He was hospitalized from 3/19 through 01/21/2012 for similar symptoms. At that time, he presented with multiple episodes of bloody bowel movements. His aspirin was held.  He underwent US CT of the abdomen and pelvis that showed colonic diverticulosis but no other acute findings. He does have a known aortic abdominal aneurysm but this appeared stable on CT. During that hospitalization he had a decrease in his hemoglobin from 12.7 to 10.0 but it remained stable prior to discharge. He was evaluated by gastroenterology who initially planned to do a colonoscopy; however, with the bowel prep his stools cleared with no further rectal bleeding. Therefore, they canceled the colonoscopy and recommended discharge home. Patient was at home for 48 hours, tolerating a regular diet. During that time he states that he felt well until this morning when he developed some lightheadedness followed by multiple bloody bowel movements. The first was at 6 AM and occurred hourly until about 10 AM. He felt very weak, lightheaded, and near syncopal. He called me and was directly admitted for monitoring and further evaluation.  Review of Systems:  Review of Systems - all systems reviewed with the patient and are negative except as in history of present illness   Past Medical History: Lower GI bleed presumed secondary to diverticulosis (3/13)  COLONIC POLYPS, ADENOMATOUS, HX OF  Impaired fasting glucose  Diverticulosis (colonoscopy 2011) Hypertension  Hyperlipidemia  Impaired fasting glucose  Peripheral neuropathy  Obesity  Gastroesophageal reflux disease  History of acute prostatitis  Macula degeneration  Overactive bladder   Past Surgical History  Procedure Date  . Knee surgery     left knee in 1970's     Medications: Prior to Admission medications   Medication Sig Start Date End Date Taking? Authorizing Provider  acetaminophen (TYLENOL) 500 MG tablet Take 1,000 mg by mouth every 6 (six) hours as needed. pain   Yes Historical Provider, MD  Calcium Carbonate-Vitamin D (CALCIUM + D PO) Take 1 tablet by mouth daily.   Yes Historical Provider, MD  multivitamin-lutein (OCUVITE-LUTEIN) CAPS Take 1 capsule by mouth daily.   Yes Historical Provider, MD  omeprazole (PRILOSEC OTC) 20 MG tablet Take 1 tablet (20 mg total) by mouth daily. 01/21/12 01/20/13 Yes W Buren Kos, MD    Allergies:   Allergies  Allergen Reactions  . 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 Quit smoking in 1957  Family History: Father having died at the age of 52 a rheumatic heart, mother died at the age of 53, 3 children alive and well  Physical Exam: Filed Vitals:   01/23/12 1339  BP: 138/69  Pulse: 99  Temp: 97.5 F (36.4 C)  TempSrc: Oral  Resp: 18  SpO2: 96%   General appearance: alert, no distress and More pale appearing Head: Normocephalic, without obvious abnormality, atraumatic Eyes: conjunctivae/corneas clear. PERRL, EOM's intact.  Nose: Nares normal. Septum midline. Mucosa normal. No drainage or sinus tenderness. Throat: lips, mucosa, and tongue normal; teeth and gums normal Neck: no adenopathy, no  carotid bruit, no JVD and thyroid not enlarged, symmetric, no tenderness/mass/nodules Resp: clear to auscultation bilaterally Cardio: regular rate and rhythm, S1, S2 normal, no murmur, click, rub or gallop GI: soft, non-tender; bowel sounds normal; no masses,  no organomegaly Extremities: extremities normal, atraumatic, no  cyanosis or edema Pulses: 2+ and symmetric Lymph nodes: Cervical adenopathy: no cervical lymphadenopathy Neurologic: Alert and oriented X 3, normal strength and tone. Normal symmetric reflexes.     Labs on Admission:   [facility] 01/21/12 0459  NA 140  K 3.1*  CL 109  CO2 20  GLUCOSE 113*  BUN 12  CREATININE 0.71  CALCIUM 8.6  MG --  PHOS --    Basename 01/21/12 1252 01/21/12 0459  WBC -- 8.3  NEUTROABS -- --  HGB 10.0* 10.1*  HCT 30.4* 30.7*  MCV -- 91.1  PLT -- 264   No results found for this basename: CKTOTAL:3,CKMB:3,CKMBINDEX:3,TROPONINI:3 in the last 72 hours Lab Results  Component Value Date   INR 1.07 01/18/2012    Radiological Exams on Admission: Ct Abdomen Pelvis W Contrast  01/18/2012  *RADIOLOGY REPORT*  Clinical Data: Rectal bleeding.  History of abdominal aortic aneurysm.  CT ABDOMEN AND PELVIS WITH CONTRAST  Technique:  Multidetector CT imaging of the abdomen and pelvis was performed following the standard protocol during bolus administration of intravenous contrast.  Contrast: OMNIPAQUE IOHEXOL 300 MG/ML IJ SOLN  Comparison: No priors.  Findings:  Lung Bases: Calcifications in the distal right coronary artery. Small hiatal hernia.  Abdomen/Pelvis:  Small amount of high attenuation material layering dependently in the gallbladder, suggestive of biliary sludge and/or small gallstones.  However, the gallbladder is not significantly distended, nor is there significant pericholecystic fluid or stranding at this time to suggest the presence of an acute cholecystitis.  The enhanced appearance of the liver, pancreas, spleen, bilateral adrenal glands and right kidney are unremarkable. There are multiple subcentimeter low attenuation lesions in the left kidney which is too small to definitively characterize.  In addition, there is a exophytic 1.4 cm intermediate attenuation lesion extending off the anterior aspect of the upper pole of the left kidney which may have some  internal soft tissue or enhancement.  A fusiform infrarenal abdominal aortic aneurysm is noted measuring up to 4.2 cm in diameter.  There is a small amount of these centric nonenhancing material in the sac of the aneurysm, likely represent mural thrombus and/or atheromatous plaque.  More proximally within the aorta, at the level of the hiatus, there is an ulcerated plaque.  No penetrating aortic ulcer or dissection is noted at this time.  The iliac vasculature is remarkable for moderate atherosclerosis, without evidence of aneurysm or dissection.  Normal retrocecal appendix.  There are numerous colonic diverticula, predominately of the distal descending colon and sigmoid colon, without surrounding inflammatory changes at this time to suggest an acute diverticulitis.  There is no ascites or pneumoperitoneum and no pathologic distension of bowel.  No definite pathologic adenopathy identified within the abdomen or pelvis.  Multiple calcifications within the prostate gland (which is only mildly enlarged).  Urinary bladder is unremarkable.  Musculoskeletal: There are no aggressive appearing lytic or blastic lesions noted in the visualized portions of the skeleton.  Mild compression fracture of superior endplate of L4 with less than 10% loss of vertebral body height, which appears chronic.  IMPRESSION: 1.  Colonic diverticulosis without findings to suggest acute diverticulitis. This may be a source of rectal bleeding in this patient. 2.  4.2 cm  fusiform infrarenal abdominal aortic aneurysm, as above. 3.  Small amount of biliary sludge and/or small gallstones layering dependently within the gallbladder lumen.  No findings to suggest acute cholecystitis at this time. 4.  Multiple subcentimeter low attenuation lesions in the left kidney are too small to definitively characterize.  In addition, however, there is a 1.4 cm intermediate attenuation lesion extending  exophytically from the upper pole of the left kidney which may  have some internal soft tissue or enhancement.  Further evaluation with non emergent renal ultrasound is recommended to exclude underlying neoplasm. 5.  Small hiatal hernia. 6. Atherosclerosis, including right coronary artery disease. Please note that although the presence of coronary artery calcium documents the presence of coronary artery disease, the severity of this disease and any potential stenosis cannot be assessed on this non-gated CT examination.  Assessment for potential risk factor modification, dietary therapy or pharmacologic therapy may be warranted, if clinically indicated.  Original Report Authenticated By: Florencia Reasons, M.D.   US Aorta  01/04/2012  *RADIOLOGY REPORT*  Clinical Data:  Back pain.  History of abdominal aortic aneurysm.  ULTRASOUND OF ABDOMINAL AORTA  Technique:  Ultrasound examination of the abdominal aorta was performed to evaluate for abdominal aortic aneurysm.  Comparison: None.  Abdominal Aorta:  Infrarenal abdominal aortic aneurysm is noted with mural thrombus.        Maximum AP diameter:  4.7 cm.       Maximum TRV diameter:  4.2 cm.  Right common iliac artery:  Maximum AP diameter 1.2 cm.  Maximum transverse diameter 1.4 cm.  Left common iliac artery:  Maximum AP diameter 1.6 cm.  Maximum transverse diameter 1.5 cm.  IMPRESSION:  4.7 x 4.2 cm infrarenal abdominal aortic aneurysm with mural thrombus.  Original Report Authenticated By: P. Loralie Champagne, M.D.   Orders placed during the hospital encounter of 01/18/12  . ED EKG  . ED EKG  . EKG    Assessment/Plan Principal Problem: 1. *Rectal bleeding- recurrent hematochezia is most likely secondary to recurrent diverticular bleed. He will be readmitted for close monitoring of hemoglobin and hematocrit, hemodynamic monitoring, and transfusion as needed. I discussed with gastroenterology (Dr. Loreta Ave on call).  He will be prepped again tonight for a diagnostic colonoscopy tomorrow.  Active Problems: 2. Anemia  secondary to acute blood loss-we'll monitor hemoglobin and transfuse if his hemoglobin drops below 8 or if he is more symptomatic.  3. GERD continue Prilosec 4. COLONIC POLYPS, ADENOMATOUS, HX OF-last colonoscopy around 2011 5. Impaired fasting glucose- recent blood sugars were normal. Will hold off on checking CBGs. 6. Diverticulosis 7. Hypertension- monitor blood pressure. Not currently on any antihypertensive therapy. 8. DVT prophylaxis with SCDs.  Shammond Arave,W DOUGLAS 01/23/2012, 2:44 PM

## 2012-01-23 NOTE — Progress Notes (Signed)
Spoke to Dr. Loreta Ave, on floor concerning pt. Output, reports of dizziness with standing and orthostatics completed, states she is going to write orders for ivf and colonoscopy. Also that pt. Is to receive one unit of PRBCs tonight, md states this is fine.

## 2012-01-24 ENCOUNTER — Encounter (HOSPITAL_COMMUNITY): Payer: Self-pay | Admitting: *Deleted

## 2012-01-24 ENCOUNTER — Encounter (HOSPITAL_COMMUNITY)
Admission: AD | Disposition: A | Discharge status: Home / Self Care | Payer: Self-pay | Source: Ambulatory Visit | Attending: Internal Medicine

## 2012-01-24 DIAGNOSIS — D126 Benign neoplasm of colon, unspecified: Secondary | ICD-10-CM | POA: Diagnosis present

## 2012-01-24 DIAGNOSIS — D62 Acute posthemorrhagic anemia: Secondary | ICD-10-CM | POA: Diagnosis present

## 2012-01-24 DIAGNOSIS — I10 Essential (primary) hypertension: Secondary | ICD-10-CM | POA: Diagnosis not present

## 2012-01-24 DIAGNOSIS — D649 Anemia, unspecified: Secondary | ICD-10-CM | POA: Diagnosis not present

## 2012-01-24 DIAGNOSIS — K625 Hemorrhage of anus and rectum: Secondary | ICD-10-CM

## 2012-01-24 DIAGNOSIS — E876 Hypokalemia: Secondary | ICD-10-CM | POA: Diagnosis not present

## 2012-01-24 DIAGNOSIS — K5731 Diverticulosis of large intestine without perforation or abscess with bleeding: Secondary | ICD-10-CM | POA: Diagnosis not present

## 2012-01-24 HISTORY — PX: COLONOSCOPY: SHX5424

## 2012-01-24 LAB — CBC
HCT: 25.1 % — ABNORMAL LOW (ref 39.0–52.0)
Hemoglobin: 8.4 g/dL — ABNORMAL LOW (ref 13.0–17.0)
MCV: 91.3 fL (ref 78.0–100.0)
RDW: 15.5 % (ref 11.5–15.5)
WBC: 8 10*3/uL (ref 4.0–10.5)

## 2012-01-24 LAB — BASIC METABOLIC PANEL
BUN: 13 mg/dL (ref 6–23)
CO2: 25 mEq/L (ref 19–32)
Chloride: 110 mEq/L (ref 96–112)
Creatinine, Ser: 0.78 mg/dL (ref 0.50–1.35)
GFR calc Af Amer: >90 mL/min (ref >90)
Glucose, Bld: 109 mg/dL — ABNORMAL HIGH (ref 70–99)
Potassium: 3.8 mEq/L (ref 3.5–5.1)

## 2012-01-24 SURGERY — COLONOSCOPY
Anesthesia: Moderate Sedation

## 2012-01-24 MED ORDER — MIDAZOLAM HCL 5 MG/5ML IJ SOLN
INTRAMUSCULAR | Status: DC | PRN
  Administered 2012-01-24: 1 mg via INTRAVENOUS
  Administered 2012-01-24 (×2): 2 mg via INTRAVENOUS

## 2012-01-24 MED ORDER — HYDROCORTISONE ACE-PRAMOXINE 2.5-1 % RE CREA
TOPICAL_CREAM | Freq: Three times a day (TID) | RECTAL | Status: DC
  Administered 2012-01-25 – 2012-01-26 (×5): via RECTAL
  Filled 2012-01-24 (×3): qty 30

## 2012-01-24 MED ORDER — FENTANYL NICU IV SYRINGE 50 MCG/ML
INJECTION | INTRAMUSCULAR | Status: DC | PRN
  Administered 2012-01-24: 10 ug via INTRAVENOUS
  Administered 2012-01-24: 25 ug via INTRAVENOUS
  Administered 2012-01-24: 15 ug via INTRAVENOUS
  Administered 2012-01-24: 25 ug via INTRAVENOUS

## 2012-01-24 MED ORDER — ACETAMINOPHEN 325 MG PO TABS
650.0000 mg | ORAL_TABLET | Freq: Once | ORAL | Status: AC
  Administered 2012-01-24: 650 mg via ORAL
  Filled 2012-01-24 (×2): qty 1

## 2012-01-24 MED ORDER — DIPHENHYDRAMINE HCL 25 MG PO CAPS
25.0000 mg | ORAL_CAPSULE | Freq: Once | ORAL | Status: AC
  Administered 2012-01-24: 25 mg via ORAL
  Filled 2012-01-24: qty 1

## 2012-01-24 NOTE — Op Note (Signed)
[facility] [address] [place], [place]  [address]  COLONOSCOPY PROCEDURE REPORT  PATIENT:  Carl Rios, Carl Rios  MR#:  [id number] BIRTHDATE:  07-30-1926, 86 yrs. old  GENDER:  male ENDOSCOPIST:  Hedwig Morton. Juanda Chance, MD REF. BY:  Kari Baars, M.D. PROCEDURE DATE:  01/24/2012 PROCEDURE:  Colonoscopy with biopsy ASA CLASS:  Class III INDICATIONS:  hematochezia, Gastrointestinal hemorrhage colon 2007 and 03/2010 - adenomatous polyps now readmission for hematochezia,. Hgb down to 8.0 MEDICATIONS:   These medications were titrated to patient response per physician's verbal order, Versed 5 mg, Fentanyl 75 mcg  DESCRIPTION OF PROCEDURE:   After the risks and benefits and of the procedure were explained, informed consent was obtained. Digital rectal exam was performed and revealed no rectal masses. The Pentax Ped Colon [id number] endoscope was introduced through the anus and advanced to the cecum, which was identified by both the appendix and ileocecal valve.  The quality of the prep was excellent, using MiraLax.  The instrument was then slowly withdrawn as the colon was fully examined. <<PROCEDUREIMAGES>>  FINDINGS:  Severe diverticulosis was found in the sigmoid colon (see image1 and image2). multiple diverticuli 25-35 cm with no stigmata of bleeding,  Two polyps were found. 2-3 mm sessile polyps in the cecum and at 80 cm The polyps were removed using cold biopsy forceps (see image6 and image8).  An ulcer was found in the rectum (see image11, image12, and image9). small superficial erosion/ulceration in the anal canal  This was otherwise a normal examination of the colon (see image9, image7, image5, and image3). there was no blood in the colon   Retroflexed views in the rectum revealed no abnormalities.    The scope was then withdrawn from the patient and the procedure completed.  COMPLICATIONS:  None ENDOSCOPIC IMPRESSION: 1) Severe diverticulosis in the sigmoid colon 2) Two  polyps 3) Ulcer in the rectum 4) Otherwise normal examination no active or recent bleeding, still not sure if anal source or diverticular bleed. Judging from the magnitude of his anemia the diverticulat bleed more likely. Will treat anal canal with topical stweroids RECOMMENDATIONS: 1) Await pathology results advance diet, keep in the hospital longer this time to stabilize him, Analpram cream 2.5% tid  REPEAT EXAM:  In 0 year(s) for.  no recall due to age  ______________________________ Hedwig Morton. Juanda Chance, MD  CC:  n. eSIGNED:   Hedwig Morton. Walter Grima at 01/24/2012 03:22 PM  Keturah Shavers, [id number]

## 2012-01-24 NOTE — Progress Notes (Signed)
Subjective: Feels better this am.  Tolerated 1 unit PRBCs.  Prepped for colonoscopy- stools are clear with small amount of blood per patient report.  No abdominal pain.  Still feels weak.  Objective: Vital signs in last 24 hours: Temp:  [97.5 F (36.4 C)-99.3 F (37.4 C)] 98.1 F (36.7 C) (03/25 0500) Pulse Rate:  [87-120] 87  (03/25 0500) Resp:  [16-20] 18  (03/25 0500) BP: (104-138)/(54-85) 126/85 mmHg (03/25 0500) SpO2:  [94 %-97 %] 94 % (03/25 0500) Weight:  [91.173 kg (201 lb)] 91.173 kg (201 lb) (03/24 1339) Weight change:  Last BM Date: 01/23/12  CBG (last 3)   Basename 01/21/12 1134 01/21/12 0749  GLUCAP 92 101*    Intake/Output from previous day: 03/24 0701 - 03/25 0700 In: 4224.4 [P.O.:480; I.V.:2477.8; IV Piggyback:1266.7] Out: 200 [Urine:200] Intake/Output this shift:    General appearance: alert and less pale appearing Eyes: no scleral icterus Throat: oropharynx moist without erythema Resp: clear to auscultation bilaterally Cardio: regular rate and rhythm, S1, S2 normal, no murmur, click, rub or gallop GI: soft, non-tender; bowel sounds normal; no masses,  no organomegaly Extremities: no clubbing, cyanosis or edema   Lab Results:  [facility] 01/24/12 0428 01/23/12 1535  NA 142 136  K 3.8 4.0  CL 110 103  CO2 25 25  GLUCOSE 109* 111*  BUN 13 19  CREATININE 0.78 0.67  CALCIUM 8.2* 8.8  MG -- --  PHOS -- --   No results found for this basename: AST:2,ALT:2,ALKPHOS:2,BILITOT:2,PROT:2,ALBUMIN:2 in the last 72 hours  Basename 01/24/12 0428 01/23/12 1535  WBC 8.0 10.7*  NEUTROABS -- --  HGB 8.4* 8.0*  HCT 25.1* 24.1*  MCV 91.3 93.1  PLT 275 283   Lab Results  Component Value Date   INR 1.07 01/23/2012   INR 1.07 01/18/2012    Studies/Results: No results found.   Medications: Scheduled:   . acetaminophen  650 mg Oral Once  . calcium-vitamin D  1 tablet Oral BID  . diphenhydrAMINE  25 mg Oral Once  . multivitamin-lutein  1 capsule Oral  Daily  . pantoprazole  40 mg Oral Q1200  . polyethylene glycol-electrolytes  4,000 mL Oral Once  . sodium chloride  2,000 mL Intravenous Once  . DISCONTD: Calcium Carbonate-Vitamin D   Oral BID  . DISCONTD: omeprazole  20 mg Oral Daily   Continuous:   . sodium chloride 75 mL/hr at 01/24/12 1610    Assessment/Plan: Principal Problem: 1. *Rectal bleeding- prepped for colonoscopy today for further evaluation.  Likely diverticular.  Continue supportive care.  Active Problems: 2. Anemia due to acute blood loss- received 1 unit PRBC with suboptimal rise in Hg.  Will give 1 more unit due to ongoing symptoms of dizziness and fatigue. 3. GERD- continue PPI. 4. COLONIC POLYPS, ADENOMATOUS, HX OF 5. Impaired fasting glucose- glucoses were stable during last stay. 6. Diverticulosis 7. Hypertension- BP stable. 8. Disposition- anticipate discharge in 2-3 days of bleeding resolved and Hg stable.        LOS: 1 day   Lu Paradise,W DOUGLAS 01/24/2012, 7:32 AM

## 2012-01-24 NOTE — Progress Notes (Signed)
Subjective: Pt tolerated prep without difficulty, stool cleared- no active bleeding.  Objective: Vital signs in last 24 hours: Temp:  [97.5 F (36.4 C)-99.3 F (37.4 C)] 98.2 F (36.8 C) (03/25 0759) Pulse Rate:  [87-120] 87  (03/25 0759) Resp:  [16-20] 18  (03/25 0759) BP: (104-138)/(54-85) 137/63 mmHg (03/25 0759) SpO2:  [94 %-97 %] 96 % (03/25 0759) Weight:  [201 lb (91.173 kg)] 201 lb (91.173 kg) (03/24 1339) Last BM Date: 01/23/12  Intake/Output from previous day: 03/24 0701 - 03/25 0700 In: 4224.4 [P.O.:480; I.V.:2477.8; IV Piggyback:1266.7] Out: 200 [Urine:200] Intake/Output this shift:     Wd elderly WM in NAD , CV; RRR, Pulm; clear bilaterally , ABD; soft, nontender BS+  Lab Results:  Basename 01/24/12 0428 01/23/12 1535 01/21/12 1252  WBC 8.0 10.7* --  HGB 8.4* 8.0* 10.0*  HCT 25.1* 24.1* 30.4*  PLT 275 283 --   BMET  Basename 01/24/12 0428 01/23/12 1535  NA 142 136  K 3.8 4.0  CL 110 103  CO2 25 25  GLUCOSE 109* 111*  BUN 13 19  CREATININE 0.78 0.67  CALCIUM 8.2* 8.8   LFT No results found for this basename: PROT,ALBUMIN,AST,ALT,ALKPHOS,BILITOT,BILIDIR,IBILI in the last 72 hours PT/INR  Basename 01/23/12 1535  LABPROT 14.1  INR 1.07        Assessment/Plan:  #1 78 yo male with recurrent lower GI bleed- likely Diverticular. For colonoscopy this am, no active bleeding since started prep.  #2 Anemia-secondary to acute blood loss- to be transfused one more unit PRBc's today.   LOS: 1 day   Jireh Elmore 01/24/2012, 9:18 AM

## 2012-01-24 NOTE — Progress Notes (Signed)
Amy Esterwood, PA notified that patient not on schedule for endoscopy since order did not get put in up a gastroenterologist. PA to call endoscopy and have patient on schedule.

## 2012-01-25 ENCOUNTER — Encounter (HOSPITAL_COMMUNITY): Payer: Self-pay | Admitting: Internal Medicine

## 2012-01-25 ENCOUNTER — Encounter: Payer: Self-pay | Admitting: Internal Medicine

## 2012-01-25 DIAGNOSIS — K5730 Diverticulosis of large intestine without perforation or abscess without bleeding: Secondary | ICD-10-CM

## 2012-01-25 DIAGNOSIS — K625 Hemorrhage of anus and rectum: Secondary | ICD-10-CM | POA: Diagnosis not present

## 2012-01-25 DIAGNOSIS — I10 Essential (primary) hypertension: Secondary | ICD-10-CM | POA: Diagnosis not present

## 2012-01-25 DIAGNOSIS — E876 Hypokalemia: Secondary | ICD-10-CM | POA: Diagnosis not present

## 2012-01-25 DIAGNOSIS — D649 Anemia, unspecified: Secondary | ICD-10-CM | POA: Diagnosis not present

## 2012-01-25 DIAGNOSIS — D126 Benign neoplasm of colon, unspecified: Secondary | ICD-10-CM | POA: Diagnosis not present

## 2012-01-25 DIAGNOSIS — D62 Acute posthemorrhagic anemia: Secondary | ICD-10-CM | POA: Diagnosis not present

## 2012-01-25 DIAGNOSIS — K5731 Diverticulosis of large intestine without perforation or abscess with bleeding: Secondary | ICD-10-CM | POA: Diagnosis not present

## 2012-01-25 LAB — CBC
HCT: 26.5 % — ABNORMAL LOW (ref 39.0–52.0)
Hemoglobin: 8.6 g/dL — ABNORMAL LOW (ref 13.0–17.0)
Hemoglobin: 8.7 g/dL — ABNORMAL LOW (ref 13.0–17.0)
MCH: 30.8 pg (ref 26.0–34.0)
MCH: 30 pg (ref 26.0–34.0)
MCHC: 32.8 g/dL (ref 30.0–36.0)
MCV: 91.4 fL (ref 78.0–100.0)
RBC: 2.79 MIL/uL — ABNORMAL LOW (ref 4.22–5.81)
RBC: 2.9 MIL/uL — ABNORMAL LOW (ref 4.22–5.81)
WBC: 8.4 10*3/uL (ref 4.0–10.5)

## 2012-01-25 LAB — BASIC METABOLIC PANEL
BUN: 8 mg/dL (ref 6–23)
CO2: 26 mEq/L (ref 19–32)
Calcium: 8.2 mg/dL — ABNORMAL LOW (ref 8.4–10.5)
GFR calc non Af Amer: 81 mL/min — ABNORMAL LOW (ref >90)
Glucose, Bld: 90 mg/dL (ref 70–99)

## 2012-01-25 LAB — TYPE AND SCREEN
Antibody Screen: NEGATIVE
Unit division: 0

## 2012-01-25 LAB — HEMOGLOBIN AND HEMATOCRIT, BLOOD
HCT: 28.2 % — ABNORMAL LOW (ref 39.0–52.0)
Hemoglobin: 9.4 g/dL — ABNORMAL LOW (ref 13.0–17.0)

## 2012-01-25 MED ORDER — POLYSACCHARIDE IRON COMPLEX 150 MG PO CAPS
150.0000 mg | ORAL_CAPSULE | Freq: Every day | ORAL | Status: DC
  Administered 2012-01-25 – 2012-01-26 (×2): 150 mg via ORAL
  Filled 2012-01-25 (×2): qty 1

## 2012-01-25 MED ORDER — POTASSIUM CHLORIDE CRYS ER 20 MEQ PO TBCR
40.0000 meq | EXTENDED_RELEASE_TABLET | Freq: Once | ORAL | Status: AC
  Administered 2012-01-25: 40 meq via ORAL
  Filled 2012-01-25: qty 2

## 2012-01-25 NOTE — Progress Notes (Signed)
Patient ID: Carl Rios, male   DOB: [birth year], 78 y.o.   MRN: [id number] [facility] Progress Note  Subjective: Feels better, had a BM this am with no blood. Going to ambulate this am. Reviewed colonoscopy findings.  Objective:  Vital signs in last 24 hours: Temp:  [97.8 F (36.6 C)-98.5 F (36.9 C)] 98.1 F (36.7 C) (03/26 0600) Pulse Rate:  [77-89] 77  (03/26 0600) Resp:  [12-27] 18  (03/26 0600) BP: (121-155)/(58-89) 121/72 mmHg (03/26 0600) SpO2:  [93 %-100 %] 94 % (03/26 0600) Weight:  [201 lb (91.173 kg)] 201 lb (91.173 kg) (03/25 1345) Last BM Date: 01/23/12 General:   Alert,  Well-developed,    in NAD Heart:  Regular rate and rhythm; no murmurs Pulm;clear Abdomen:  Soft, nontender and nondistended. Normal bowel sounds   Extremities:  Without edema. Neurologic:  Alert and  oriented x4;  grossly normal neurologically. Psych:  Alert and cooperative. Normal mood and affect.  Intake/Output from previous day: 03/25 0701 - 03/26 0700 In: 3595 [P.O.:720; I.V.:2500; Blood:375] Out: 1500 [Urine:1500] Intake/Output this shift:    Lab Results:  Basename 01/25/12 0437 01/24/12 2315 01/24/12 0428  WBC 7.3 8.4 8.0  HGB 8.7* 8.6* 8.4*  HCT 26.5* 25.6* 25.1*  PLT 220 231 275   BMET  Basename 01/25/12 0437 01/24/12 0428 01/23/12 1535  NA 140 142 136  K 3.4* 3.8 4.0  CL 108 110 103  CO2 26 25 25   GLUCOSE 90 109* 111*  BUN 8 13 19   CREATININE 0.76 0.78 0.67  CALCIUM 8.2* 8.2* 8.8     PT/INR  Basename 01/23/12 1535  LABPROT 14.1  INR 1.07   Assessment / Plan: #1 78 yo male with recurrent diverticular bleed-resolved #2 anemia- secondary to blood loss- stable , defer to Dr. Clelia Croft regarding further transfusions #3 colon polyps- await path #4 rectal ulcer- continue analpram for 2 weeks.  Hopefully can be discharged tomorrow. Principal Problem:  *Rectal bleeding Active Problems:  GERD  COLONIC POLYPS, ADENOMATOUS, HX OF  Impaired fasting glucose  Diverticulosis  Hypertension  Anemia due to acute blood loss  Benign neoplasm of colon  Hypokalemia     LOS: 2 days   Carl Rios  01/25/2012, 9:16 AM

## 2012-01-25 NOTE — Progress Notes (Signed)
Pt hgb 9.4 hct 28.2 trending up. Contact physician if hgb less than 8.0. Plan to be discharged in the am 01/26/12. Annitta Needs, RN

## 2012-01-25 NOTE — Progress Notes (Signed)
Stable after colonoscopy. Still not sure were he bled from, but diverticula most likely, OK to D/C in am. I advised Metamucil 1 tsp daily. Iron supplements.

## 2012-01-25 NOTE — Progress Notes (Signed)
Subjective: Tolerated colonoscopy.  No BM since procedure.  Received 2nd unit of blood last pm.  Feels a little stronger.  No abdominal pain.  Increased heartburn last night after dinner- attributes to eating too fast.  Objective: Vital signs in last 24 hours: Temp:  [97.8 F (36.6 C)-98.5 F (36.9 C)] 98.1 F (36.7 C) (03/26 0600) Pulse Rate:  [77-89] 77  (03/26 0600) Resp:  [12-27] 18  (03/26 0600) BP: (121-155)/(58-89) 121/72 mmHg (03/26 0600) SpO2:  [93 %-100 %] 94 % (03/26 0600) Weight:  [91.173 kg (201 lb)] 91.173 kg (201 lb) (03/25 1345) Weight change: 0 kg (0 lb) Last BM Date: 01/23/12  CBG (last 3)  No results found for this basename: GLUCAP:3 in the last 72 hours  Intake/Output from previous day: 03/25 0701 - 03/26 0700 In: 3595 [P.O.:720; I.V.:2500; Blood:375] Out: 1500 [Urine:1500] Intake/Output this shift:    General appearance: alert and fatigued Eyes: no scleral icterus Throat: oropharynx moist without erythema Resp: clear to auscultation bilaterally Cardio: regular rate and rhythm, S1, S2 normal, no murmur, click, rub or gallop GI: soft, non-tender; bowel sounds normal; no masses,  no organomegaly Extremities: no clubbing, cyanosis or edema   Lab Results:  [facility] 01/25/12 0437 01/24/12 0428  NA 140 142  K 3.4* 3.8  CL 108 110  CO2 26 25  GLUCOSE 90 109*  BUN 8 13  CREATININE 0.76 0.78  CALCIUM 8.2* 8.2*  MG -- --  PHOS -- --     Basename 01/25/12 0437 01/24/12 2315  WBC 7.3 8.4  NEUTROABS -- --  HGB 8.7* 8.6*  HCT 26.5* 25.6*  MCV 91.4 91.8  PLT 220 231   Lab Results  Component Value Date   INR 1.07 01/23/2012   INR 1.07 01/18/2012    Studies/Results: No results found.   Medications: Scheduled:   . acetaminophen  650 mg Oral Once  . calcium-vitamin D  1 tablet Oral BID  . diphenhydrAMINE  25 mg Oral Once  . hydrocortisone-pramoxine   Rectal TID  . multivitamin-lutein  1 capsule Oral Daily  . pantoprazole  40 mg Oral Q1200    Continuous:   . sodium chloride 75 mL/hr at 01/24/12 2201    Assessment/Plan: Principal Problem: 1. *Rectal bleeding- colonoscopy shows rectal ulcer and severe diverticulosis (most likely source).  Will monitor for 24 hours for recurrence.  Active Problems: 2. Anemia due to acute blood loss- s/p 2 units PRBCs.  Recheck Hg at 1:00pm and transfuse 1 more unit if below 8.0.  Add Iron supplement. 3. Benign neoplasm of colon- analpram ordered for rectal ulcer. 4. Diverticulosis- if recurrent bleeding may need to consider Tagged Red Cell Scan and/or surgical consult 5. GERD- continue PPI.  Increased symptoms last pm. 6.  COLONIC POLYPS, ADENOMATOUS, HX OF 7. Impaired fasting glucose- am sugars are OK. 8. Hypertension- stable without medications. 9. Hypokalemia- replete po. 10. Disposition- anticipate discharge tomorrow if Hg stable, no further bleeding, and ambulating OK.    LOS: 2 days   Arlisha Patalano,W DOUGLAS 01/25/2012, 7:43 AM

## 2012-01-25 NOTE — Progress Notes (Signed)
analpram medication is located in patient medication drawer. Scheduled for 1000 and 1600 patient will receive dose on schedule. Annitta Needs, RN 01/25/2012 9:28

## 2012-01-26 LAB — CBC
Hemoglobin: 9 g/dL — ABNORMAL LOW (ref 13.0–17.0)
RBC: 3.03 MIL/uL — ABNORMAL LOW (ref 4.22–5.81)
WBC: 7.4 10*3/uL (ref 4.0–10.5)

## 2012-01-26 LAB — BASIC METABOLIC PANEL
CO2: 25 mEq/L (ref 19–32)
Chloride: 107 mEq/L (ref 96–112)
GFR calc non Af Amer: 78 mL/min — ABNORMAL LOW (ref >90)
Glucose, Bld: 101 mg/dL — ABNORMAL HIGH (ref 70–99)
Potassium: 4 mEq/L (ref 3.5–5.1)
Sodium: 139 mEq/L (ref 135–145)

## 2012-01-26 MED ORDER — HYDROCORTISONE ACE-PRAMOXINE 2.5-1 % RE CREA: TOPICAL_CREAM | Freq: Three times a day (TID) | RECTAL | Status: AC

## 2012-01-26 MED ORDER — PSYLLIUM 0.52 G PO CAPS: 0.5200 g | ORAL_CAPSULE | Freq: Every day | ORAL | Status: AC

## 2012-01-26 MED ORDER — POLYSACCHARIDE IRON COMPLEX 150 MG PO CAPS: 150.0000 mg | ORAL_CAPSULE | Freq: Every day | ORAL | Status: AC

## 2012-01-26 NOTE — Discharge Instructions (Signed)
Low Fiber and Residue Restricted Diet A low fiber diet restricts foods that contain carbohydrates that are not digested in the small intestine. A diet containing about 10 g of fiber is considered low fiber. The diet needs to be individualized to suit patient tolerances and preferences and to avoid unnecessary restrictions. Generally, the foods emphasized in a low fiber diet have no skins or seeds. They may have been processed to remove bran, germ, or husks. Cooking may not necessarily eliminate the fiber. Cooking may, in fact, enable a greater quantity of fiber to be consumed in a lesser volume. Legumes and nuts are also restricted. The term low residue has also been used to describe low fiber diets, although the two are not the same. Residue refers to any substance that adds to bowel (colonic) contents, such as sloughed cells and intestinal bacteria, in addition to fiber. Residue-containing foods, prunes and prune juice, milk, and connective tissue from meats may also need to be eliminated. It is important to eliminate these foods during sudden (acute) attacks of inflammatory bowel disease, when there is a partial obstruction due to another reason, or when minimal fecal output is desired. When these problems are gone, a more normal diet may be used. PURPOSE  Prevent blockage of a partially obstructed or narrowed gastrointestinal tract.   Reduce stool weight and volume.   Slow the movement of waste.  WHEN IS THIS DIET USED?  Acute phase of Crohn's disease, ulcerative colitis, regional enteritis, or diverticulitis.   Narrowing (stenosis) of intestinal or esophageal tubes (lumina).   Transitional diet following surgery, injury (trauma), or illness.  ADEQUACY This diet is nutritionally adequate based on individual food choices according to the Recommended Dietary Allowances of the National Research Council. CHOOSING FOODS Check labels, especially on foods from the starch list. Often, dietary fiber  content is listed with the Nutrition Facts panel.  Breads and Starches  Allowed: White, French, and pita breads, plain rolls, buns, or sweet rolls, doughnuts, waffles, pancakes, bagels. Plain muffins, sweet breads, biscuits, matzoth. Flour. Soda, saltine, or graham crackers. Pretzels, rusks, melba toast, zwieback. Cooked cereals: cornmeal, farina, cream cereals. Dry cereals: refined corn, wheat, rice, and oat cereals (check label). Potatoes prepared any way without skins, refined macaroni, spaghetti, noodles, refined rice.   Avoid: Bread, rolls, or crackers made with whole-wheat, multigrains, rye, bran seeds, nuts, or coconut. Corn tortillas, table-shells. Corn chips, tortilla chips. Cereals containing whole-grains, multigrains, bran, coconut, nuts, or raisins. Cooked or dry oatmeal. Coarse wheat cereals, granola. Cereals advertised as "high fiber." Potato skins. Whole-grain pasta, wild or brown rice. Popcorn.  Vegetables  Allowed:  Strained tomato and vegetable juices. Fresh: tender lettuce, cucumber, cabbage, spinach, bean sprouts. Cooked, canned: asparagus, bean sprouts, cut green or wax beans, cauliflower, pumpkin, beets, mushrooms, olives, spinach, yellow squash, tomato, tomato sauce (no seeds), zucchini (peeled), turnips. Canned sweet potatoes. Small amounts of celery, onion, radish, and green pepper may be used. Keep servings limited to  cup.   Avoid: Fresh, cooked, or canned: artichokes, baked beans, beet greens, broccoli, Brussels sprouts, French-style green beans, corn, kale, legumes, peas, sweet potatoes. Cooked: green or red cabbage, spinach. Avoid large servings of any vegetables.  Fruit  Allowed:  All fruit juices except prune juice. Cooked or canned: apricots applesauce, cantaloupe, cherries, grapefruit, grapes, kiwi, mandarin oranges, peaches, pears, fruit cocktail, pineapple, plums, watermelon. Fresh: banana, grapes, cantaloupe, avocado, cherries, pineapple, grapefruit, kiwi,  nectarines, peaches, oranges, blueberries, plums. Keep servings limited to  cup or 1 piece.     Avoid: Fresh: apple with or without skin, apricots, mango, pears, raspberries, strawberries. Prune juice, stewed or dried prunes. Dried fruits, raisins, dates. Avoid large servings of all fresh fruits.  Meat and Meat Substitutes  Allowed:  Ground or well-cooked tender beef, ham, veal, lamb, pork, or poultry. Eggs, plain cheese. Fish, oysters, shrimp, lobster, other seafood. Liver, organ meats.   Avoid: Tough, fibrous meats with gristle. Peanut butter, smooth or chunky. Cheese with seeds, nuts, or other foods not allowed. Nuts, seeds, legumes, dried peas, beans, lentils.  Milk  Allowed:  All milk products except those not allowed. Milk and milk product consumption should be minimal when low residue is desired.   Avoid: Yogurt that contains nuts or seeds.  Soups and Combination Foods  Allowed:  Bouillon, broth, or cream soups made from allowed foods. Any strained soup. Casseroles or mixed dishes made with allowed foods.   Avoid: Soups made from vegetables that are not allowed or that contain other foods not allowed.  Desserts and Sweets  Allowed:  Plain cakes and cookies, pie made with allowed fruit, pudding, custard, cream pie. Gelatin, fruit, ice, sherbet, frozen ice pops. Ice cream, ice milk without nuts. Plain hard candy, honey, jelly, molasses, syrup, sugar, chocolate syrup, gumdrops, marshmallows.   Avoid: Desserts, cookies, or candies that contain nuts, peanut butter, or dried fruits. Jams, preserves with seeds, marmalade.  Fats and Oils  Allowed:  Margarine, butter, cream, mayonnaise, salad oils, plain salad dressings made from allowed foods. Plain gravy, crisp bacon without rind.   Avoid: Seeds, nuts, olives. Avocados.  Beverages  Allowed:  All, except those listed to avoid.   Avoid: Fruit juices with high pulp, prune juice.  Condiments  Allowed:  Ketchup, mustard, horseradish,  vinegar, cream sauce, cheese sauce, cocoa powder. Spices in moderation: allspice, basil, bay leaves, celery powder or leaves, cinnamon, cumin powder, curry powder, ginger, mace, marjoram, onion or garlic powder, oregano, paprika, parsley flakes, ground pepper, rosemary, sage, savory, tarragon, thyme, turmeric.   Avoid: Coconut, pickles.  SAMPLE MEAL PLAN The following menu is provided as a sample. Your daily menu plans will vary. Be sure to include a minimum of the following each day in order to provide essential nutrients for the adult:  Starch/Bread/Cereal Group, 6 servings.   Fruit/Vegetable Group, 5 servings.   Meat/Meat Substitute Group, 2 servings.   Milk/Milk Substitute Group, 2 servings.  A serving is equal to  cup for fruits, vegetables, and cooked cereals or 1 piece for foods such as a piece of bread, 1 orange, or 1 apple. For dry cereals and crackers, use serving sizes listed on the label. Combination foods may count as full or partial servings from various food groups. Fats, desserts, and sweets may be added to the meal plan after the requirements for essential nutrients are met. SAMPLE MENU Breakfast   cup orange juice.   1 boiled egg.   1 slice white toast.   Margarine.    cup cornflakes.   1 cup milk.   Beverage.  Lunch   cup chicken noodle soup.   2 to 3 oz sliced roast beef.   2 slices seedless rye bread.   Mayonnaise.    cup tomato juice.   1 small banana.   Beverage.  Dinner  3 oz baked chicken.    cup scalloped potatoes.    cup cooked beets.   White dinner roll.   Margarine.    cup canned peaches.   Beverage.  Document Released: 04/09/2002 Document Revised: 10/07/2011   Document Reviewed: 09/20/2011 [facility] Patient Information 2012 [place], [place].Diverticulosis Diverticulosis is a common condition that develops when small pouches (diverticula) form in the wall of the colon. The risk of diverticulosis increases with age. It  happens more often in people who eat a low-fiber diet. Most individuals with diverticulosis have no symptoms. Those individuals with symptoms usually experience abdominal pain, constipation, or loose stools (diarrhea). HOME CARE INSTRUCTIONS   Increase the amount of fiber in your diet as directed by your caregiver or dietician. This may reduce symptoms of diverticulosis.   Your caregiver may recommend taking a dietary fiber supplement.   Drink at least 6 to 8 glasses of water each day to prevent constipation.   Try not to strain when you have a bowel movement.   Your caregiver may recommend avoiding nuts and seeds to prevent complications, although this is still an uncertain benefit.   Only take over-the-counter or prescription medicines for pain, discomfort, or fever as directed by your caregiver.  FOODS WITH HIGH FIBER CONTENT INCLUDE:  Fruits. Apple, peach, pear, tangerine, raisins, prunes.   Vegetables. Brussels sprouts, asparagus, broccoli, cabbage, carrot, cauliflower, romaine lettuce, spinach, summer squash, tomato, winter squash, zucchini.   Starchy Vegetables. Baked beans, kidney beans, lima beans, split peas, lentils, potatoes (with skin).   Grains. Whole wheat bread, brown rice, bran flake cereal, plain oatmeal, white rice, shredded wheat, bran muffins.  SEEK IMMEDIATE MEDICAL CARE IF:   You develop increasing pain or severe bloating.   You have an oral temperature above 102 F (38.9 C), not controlled by medicine.   You develop vomiting or bowel movements that are bloody or black.  Document Released: 07/15/2004 Document Revised: 10/07/2011 Document Reviewed: 03/18/2010 [facility] Patient Information 2012 [place], [place].

## 2012-01-26 NOTE — Progress Notes (Signed)
Discussed discharge instructions with patient.  Verbalized understanding.  Denies pain and nausea.  No rectal bleeding noted at this time.  Vitals stable.  Patient discharged to home with friend at bedside.  No other concerns at this time.  Barrie Lyme 3:27 PM 01/26/2012

## 2012-01-26 NOTE — Discharge Summary (Signed)
DISCHARGE SUMMARY  CAMEO SHEWELL  MR#: [id number]  DOB:1926-03-05  Date of Admission: 01/23/2012 Date of Discharge: 01/26/2012  Attending Physician:Javeon Macmurray,W DOUGLAS  Patient's JXB:JYNW,G [name], MD, MD  Consults: Hart Carwin, MD- GI  Discharge Diagnoses: Recurrent Rectal bleeding, likely secondary to Diverticulosis Anemia due to acute blood loss Rectal ulcer Adenomatous colon polyp GERD Impaired fasting glucose Hypertension Hypokalemia  Past Medical History:  Lower GI bleed presumed secondary to diverticulosis (3/13)  COLONIC POLYPS, ADENOMATOUS, HX OF  Impaired fasting glucose  Diverticulosis (colonoscopy 2011)  Hypertension  Hyperlipidemia  Impaired fasting glucose  Peripheral neuropathy  Obesity  Gastroesophageal reflux disease  History of acute prostatitis  Macula degeneration  Overactive bladder   Past Surgical History   Procedure  Date   .  Knee surgery      left knee in 1970's     Discharge Medications: Medication List  As of 01/26/2012  7:21 AM   TAKE these medications         acetaminophen 500 MG tablet   Commonly known as: TYLENOL   Take 1,000 mg by mouth every 6 (six) hours as needed. pain      CALCIUM + D PO   Take 1 tablet by mouth daily.      hydrocortisone-pramoxine 2.5-1 % rectal cream   Commonly known as: ANALPRAM-HC   Place rectally 3 (three) times daily.      iron polysaccharides 150 MG capsule   Commonly known as: NIFEREX   Take 1 capsule (150 mg total) by mouth daily.      multivitamin-lutein Caps   Take 1 capsule by mouth daily.      omeprazole 20 MG tablet   Commonly known as: PRILOSEC OTC   Take 1 tablet (20 mg total) by mouth daily.      psyllium 0.52 G capsule   Commonly known as: REGULOID   Take 1 capsule (0.52 g total) by mouth daily.            Hospital Procedures: Colonoscopy (3/25)-  1) Severe diverticulosis in the sigmoid colon  2) Two polyps  3) Ulcer in the rectum  4) Otherwise normal examination  no  active or recent bleeding, still not sure if anal source or  diverticular bleed. Judging from the magnitude of his anemia the  diverticulat bleed more likely. Will treat anal canal with topical  steroids  Transfusion 2 units packed red blood cells  History of Present Illness: Mr. Mccuiston is an 78 year old white male with a history of diverticulosis who was directly admitted for recurrent bright red blood per rectum. He was hospitalized from 3/19 through 01/21/2012 for similar symptoms. At that time, he presented with multiple episodes of bloody bowel movements. His aspirin was held. He underwent US CT of the abdomen and pelvis that showed colonic diverticulosis but no other acute findings. He does have a known aortic abdominal aneurysm but this appeared stable on CT. During that hospitalization he had a decrease in his hemoglobin from 12.7 to 10.0 but it remained stable prior to discharge. He was evaluated by gastroenterology who initially planned to do a colonoscopy; however, with the bowel prep his stools cleared with no further rectal bleeding. Therefore, they canceled the colonoscopy and recommended discharge home. Patient was at home for 48 hours, tolerating a regular diet. During that time he states that he felt well until this morning when he developed some lightheadedness followed by multiple bloody bowel movements. The first was at 6 AM and occurred hourly  until about 10 AM. He felt very weak, lightheaded, and near syncopal. He called me and was directly admitted for monitoring and further evaluation.   Hospital Course: Mr. Vear Clock was admitted to a medical bed. His hemoglobin on admission was 8.4 down from 10.0 at the time of his prior discharge. given his symptoms of orthostasis and dizziness as well as his acute drop in hemoglobin he was transfused one unit packed red blood cells with a posttransfusion hemoglobin of 8.7. He was given one more unit and his  hemoglobin stabilized at 9.0 on the  day of discharge. Gastroenterology was reconsulted and performed a colonoscopy on 3/25 revealing severe sigmoid diverticulosis and a rectal ulcer. It is unclear which was the primary source of his bleeding but felt to be more likely diverticular given the large volume of his rectal bleeding. He was started on Analpram cream for his rectal ulcer.  He was monitored for 36 hours after his colonoscopy and had no further blood in his stools. He did have 2 normal bowel movements on the day prior to discharge. He is ambulating without difficulty. His hemoglobin is stable. He is stable for discharge home.   Day of Discharge Exam BP 137/67  Pulse 76  Temp(Src) 98.2 F (36.8 C) (Oral)  Resp 18  Ht 5\' 10"  (1.778 m)  Wt 91.173 kg (201 lb)  BMI 28.84 kg/m2  SpO2 94%  Physical Exam: General appearance: alert and no distress Eyes: no scleral icterus Throat: oropharynx moist without erythema Resp: clear to auscultation bilaterally Cardio: regular rate and rhythm, S1, S2 normal, no murmur, click, rub or gallop GI: soft, non-tender; bowel sounds normal; no masses,  no organomegaly Extremities: no clubbing, cyanosis or edema  Discharge Labs:  [facility] 01/26/12 0442 01/25/12 0437  NA 139 140  K 4.0 3.4*  CL 107 108  CO2 25 26  GLUCOSE 101* 90  BUN 8 8  CREATININE 0.85 0.76  CALCIUM 8.5 8.2*  MG -- --  PHOS -- --    [facility] 01/26/12 0442 01/25/12 1245 01/25/12 0437  WBC 7.4 -- 7.3  NEUTROABS -- -- --  HGB 9.0* 9.4* --  HCT 27.5* 28.2* --  MCV 90.8 -- 91.4  PLT 244 -- 220   Lab Results  Component Value Date   INR 1.07 01/23/2012   INR 1.07 01/18/2012    Discharge instructions: Discharge Orders    Future Appointments: Provider: Department: Dept Phone: Center:   02/01/2012 1:15 PM Pryor Ochoa, MD [facility] [phone] [facility]     Future Orders Please Complete By Expires   Diet - low sodium heart healthy      Scheduling Instructions:   High fiber, low-residue diet   Increase  activity slowly      Discharge instructions      Comments:   Call if blood in stool returns, increased dizziness or weakness. Use Analpram cream for 2 weeks, then discontinue.      Disposition:  to home  Follow-up Appts: Follow-up with Dr. Clelia Croft at [facility] in 1 week.  Condition on Discharge:  Improved  Tests Needing Follow-up: He should have a CBC at his followup visit.  Time with discharge extremities: 35 minutes   Signed: Navdeep Halt,W DOUGLAS 01/26/2012, 7:21 AM

## 2012-01-31 ENCOUNTER — Encounter: Payer: Self-pay | Admitting: Vascular Surgery

## 2012-02-01 ENCOUNTER — Ambulatory Visit (INDEPENDENT_AMBULATORY_CARE_PROVIDER_SITE_OTHER): Payer: Medicare Other | Admitting: Vascular Surgery

## 2012-02-01 ENCOUNTER — Encounter: Payer: Self-pay | Admitting: Vascular Surgery

## 2012-02-01 VITALS — BP 146/64 | HR 85 | Resp 16 | Ht 68.0 in | Wt 197.8 lb

## 2012-02-01 DIAGNOSIS — I714 Abdominal aortic aneurysm, without rupture, unspecified: Secondary | ICD-10-CM | POA: Insufficient documentation

## 2012-02-01 NOTE — Progress Notes (Signed)
Subjective:     Patient ID: Carl Rios, male   DOB: 21-Nov-1925, 78 y.o.   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 A CT scan with contrast was ordered which I have reviewed. This reveals a maximum diameter of the aneurysm to be 4.2 cm. The patient does have chronic back pain but has no new symptoms. He recently had some bright red blood per rectum and was evaluated by colonoscopy by Dr. Julio Alm. He was referred for further evaluation of the aortic aneurysm.  Past Medical History  Diagnosis Date  . AAA (abdominal aortic aneurysm) without rupture   . Hyperlipidemia   . Diverticulosis   . Peripheral neuropathy   . GERD (gastroesophageal reflux disease)   . Hx of adenomatous colonic polyps   . Peripheral vascular disease     History  Substance Use Topics  . Smoking status: Former Smoker -- 5 years    Quit date: 01/18/1959  . Smokeless tobacco: Never Used  . Alcohol Use: 1.5 oz/week    3 drink(s) per week     3 drinks per week    Family History  Problem Relation Age of Onset  . Heart disease Father 54    Heart diease before age 40    Allergies  Allergen Reactions  . Sulfamethoxazole Other (See Comments)    UNKNOWN    Current outpatient prescriptions:acetaminophen (TYLENOL) 500 MG tablet, Take 1,000 mg by mouth every 6 (six) hours as needed. pain, Disp: , Rfl: ;  Calcium Carbonate-Vitamin D (CALCIUM + D PO), Take 1 tablet by mouth daily., Disp: , Rfl: ;  HYDROcodone-acetaminophen (NORCO) 5-325 MG per tablet, , Disp: , Rfl: ;  hydrocortisone-pramoxine (ANALPRAM-HC) 2.5-1 % rectal cream, Place rectally 3 (three) times daily., Disp: 30 g, Rfl: 0 iron polysaccharides (NIFEREX) 150 MG capsule, Take 1 capsule (150 mg total) by mouth daily., Disp: 30 capsule, Rfl: 3;  multivitamin-lutein (OCUVITE-LUTEIN) CAPS, Take 1 capsule by mouth daily., Disp: ,  Rfl: ;  omeprazole (PRILOSEC OTC) 20 MG tablet, Take 1 tablet (20 mg total) by mouth daily., Disp: , Rfl: ;  psyllium (METAMUCIL) 0.52 G capsule, Take 1 capsule (0.52 g total) by mouth daily., Disp: , Rfl:   BP 146/64  Pulse 85  Resp 16  Ht 5\' 8"  (1.727 m)  Wt 197 lb 12.8 oz (89.721 kg)  BMI 30.08 kg/m2  SpO2 99%  Body mass index is 30.08 kg/(m^2).          Review of Systems denies chest pain, dyspnea on exertion, PND, orthopnea. Does have weakness in his arms and legs and history of blood per rectum denies all other symptoms other than occasional reflux esophagitis.     Objective:   Physical Exam blood pressure 146/64 heart rate 85 respirations 16 Gen.-alert and oriented x3 in no apparent distress HEENT normal for age Lungs no rhonchi or wheezing Cardiovascular regular rhythm no murmurs carotid pulses 3+ palpable no bruits audible Abdomen soft nontender no palpable masses Musculoskeletal free of  major deformities Skin clear -no rashes Neurologic normal Lower extremities 3+ femoral and dorsalis pedis pulses palpable bilaterally with no edema      Assessment:     4.2 cm infrarenal abdominal aortic aneurysm-asymptomatic    Plan:     Will return in one year with duplex scan of aorta in our office to monitor size of aneurysm Discuss treatment options  with patient regarding open repair and stent graft repair if aneurysm should enlarge  If patient develops severe abdominal or back discomfort will report to emergency department for CT scan

## 2012-02-07 DIAGNOSIS — K922 Gastrointestinal hemorrhage, unspecified: Secondary | ICD-10-CM | POA: Diagnosis not present

## 2012-05-18 DIAGNOSIS — H40019 Open angle with borderline findings, low risk, unspecified eye: Secondary | ICD-10-CM | POA: Diagnosis not present

## 2012-06-28 DIAGNOSIS — R7301 Impaired fasting glucose: Secondary | ICD-10-CM | POA: Diagnosis not present

## 2012-06-28 DIAGNOSIS — G609 Hereditary and idiopathic neuropathy, unspecified: Secondary | ICD-10-CM | POA: Diagnosis not present

## 2012-06-28 DIAGNOSIS — E785 Hyperlipidemia, unspecified: Secondary | ICD-10-CM | POA: Diagnosis not present

## 2012-06-28 DIAGNOSIS — N401 Enlarged prostate with lower urinary tract symptoms: Secondary | ICD-10-CM | POA: Diagnosis not present

## 2012-08-16 DIAGNOSIS — Z23 Encounter for immunization: Secondary | ICD-10-CM | POA: Diagnosis not present

## 2012-08-16 DIAGNOSIS — H43819 Vitreous degeneration, unspecified eye: Secondary | ICD-10-CM | POA: Diagnosis not present

## 2012-08-16 DIAGNOSIS — H353190 Nonexudative age-related macular degeneration, unspecified eye, stage unspecified: Secondary | ICD-10-CM | POA: Diagnosis not present

## 2012-08-28 DIAGNOSIS — G609 Hereditary and idiopathic neuropathy, unspecified: Secondary | ICD-10-CM | POA: Diagnosis not present

## 2012-08-28 DIAGNOSIS — R42 Dizziness and giddiness: Secondary | ICD-10-CM | POA: Diagnosis not present

## 2012-08-28 DIAGNOSIS — N401 Enlarged prostate with lower urinary tract symptoms: Secondary | ICD-10-CM | POA: Diagnosis not present

## 2012-09-15 DIAGNOSIS — H43819 Vitreous degeneration, unspecified eye: Secondary | ICD-10-CM | POA: Diagnosis not present

## 2012-09-15 DIAGNOSIS — Z961 Presence of intraocular lens: Secondary | ICD-10-CM | POA: Diagnosis not present

## 2012-09-15 DIAGNOSIS — H353190 Nonexudative age-related macular degeneration, unspecified eye, stage unspecified: Secondary | ICD-10-CM | POA: Diagnosis not present

## 2012-09-15 DIAGNOSIS — H40019 Open angle with borderline findings, low risk, unspecified eye: Secondary | ICD-10-CM | POA: Diagnosis not present

## 2012-09-18 DIAGNOSIS — G544 Lumbosacral root disorders, not elsewhere classified: Secondary | ICD-10-CM | POA: Diagnosis not present

## 2012-09-27 DIAGNOSIS — M5126 Other intervertebral disc displacement, lumbar region: Secondary | ICD-10-CM | POA: Diagnosis not present

## 2012-10-02 DIAGNOSIS — G63 Polyneuropathy in diseases classified elsewhere: Secondary | ICD-10-CM | POA: Diagnosis not present

## 2012-10-02 DIAGNOSIS — D518 Other vitamin B12 deficiency anemias: Secondary | ICD-10-CM | POA: Diagnosis not present

## 2012-10-02 DIAGNOSIS — R6889 Other general symptoms and signs: Secondary | ICD-10-CM | POA: Diagnosis not present

## 2013-01-08 DIAGNOSIS — R6889 Other general symptoms and signs: Secondary | ICD-10-CM | POA: Insufficient documentation

## 2013-01-08 DIAGNOSIS — G63 Polyneuropathy in diseases classified elsewhere: Secondary | ICD-10-CM | POA: Diagnosis not present

## 2013-01-08 DIAGNOSIS — D518 Other vitamin B12 deficiency anemias: Secondary | ICD-10-CM | POA: Insufficient documentation

## 2013-01-08 DIAGNOSIS — R269 Unspecified abnormalities of gait and mobility: Secondary | ICD-10-CM | POA: Insufficient documentation

## 2013-01-26 DIAGNOSIS — N318 Other neuromuscular dysfunction of bladder: Secondary | ICD-10-CM | POA: Diagnosis not present

## 2013-01-26 DIAGNOSIS — Z125 Encounter for screening for malignant neoplasm of prostate: Secondary | ICD-10-CM | POA: Diagnosis not present

## 2013-01-26 DIAGNOSIS — R7301 Impaired fasting glucose: Secondary | ICD-10-CM | POA: Diagnosis not present

## 2013-01-26 DIAGNOSIS — G609 Hereditary and idiopathic neuropathy, unspecified: Secondary | ICD-10-CM | POA: Diagnosis not present

## 2013-01-26 DIAGNOSIS — E785 Hyperlipidemia, unspecified: Secondary | ICD-10-CM | POA: Diagnosis not present

## 2013-02-02 DIAGNOSIS — Z23 Encounter for immunization: Secondary | ICD-10-CM | POA: Diagnosis not present

## 2013-02-02 DIAGNOSIS — Z0000 Encounter for general adult medical examination without abnormal findings: Secondary | ICD-10-CM | POA: Diagnosis not present

## 2013-02-02 DIAGNOSIS — R7301 Impaired fasting glucose: Secondary | ICD-10-CM | POA: Diagnosis not present

## 2013-02-02 DIAGNOSIS — E669 Obesity, unspecified: Secondary | ICD-10-CM | POA: Diagnosis not present

## 2013-02-02 DIAGNOSIS — E785 Hyperlipidemia, unspecified: Secondary | ICD-10-CM | POA: Diagnosis not present

## 2013-02-02 DIAGNOSIS — H3530 Unspecified macular degeneration: Secondary | ICD-10-CM | POA: Diagnosis not present

## 2013-02-02 DIAGNOSIS — N401 Enlarged prostate with lower urinary tract symptoms: Secondary | ICD-10-CM | POA: Diagnosis not present

## 2013-02-02 DIAGNOSIS — R269 Unspecified abnormalities of gait and mobility: Secondary | ICD-10-CM | POA: Diagnosis not present

## 2013-02-02 DIAGNOSIS — Z1331 Encounter for screening for depression: Secondary | ICD-10-CM | POA: Diagnosis not present

## 2013-02-02 DIAGNOSIS — G609 Hereditary and idiopathic neuropathy, unspecified: Secondary | ICD-10-CM | POA: Diagnosis not present

## 2013-02-02 DIAGNOSIS — K219 Gastro-esophageal reflux disease without esophagitis: Secondary | ICD-10-CM | POA: Diagnosis not present

## 2013-02-02 DIAGNOSIS — Z125 Encounter for screening for malignant neoplasm of prostate: Secondary | ICD-10-CM | POA: Diagnosis not present

## 2013-02-05 ENCOUNTER — Ambulatory Visit: Payer: Medicare Other | Attending: Neurology | Admitting: Rehabilitative and Restorative Service Providers"

## 2013-02-05 DIAGNOSIS — R269 Unspecified abnormalities of gait and mobility: Secondary | ICD-10-CM | POA: Insufficient documentation

## 2013-02-05 DIAGNOSIS — IMO0001 Reserved for inherently not codable concepts without codable children: Secondary | ICD-10-CM | POA: Insufficient documentation

## 2013-02-06 ENCOUNTER — Ambulatory Visit: Payer: Medicare Other | Admitting: Vascular Surgery

## 2013-02-07 ENCOUNTER — Ambulatory Visit: Payer: Medicare Other | Admitting: Rehabilitative and Restorative Service Providers"

## 2013-02-07 DIAGNOSIS — IMO0001 Reserved for inherently not codable concepts without codable children: Secondary | ICD-10-CM | POA: Diagnosis not present

## 2013-02-07 DIAGNOSIS — R269 Unspecified abnormalities of gait and mobility: Secondary | ICD-10-CM | POA: Diagnosis not present

## 2013-02-08 ENCOUNTER — Other Ambulatory Visit: Payer: Self-pay | Admitting: *Deleted

## 2013-02-08 DIAGNOSIS — Z1212 Encounter for screening for malignant neoplasm of rectum: Secondary | ICD-10-CM | POA: Diagnosis not present

## 2013-02-08 DIAGNOSIS — I714 Abdominal aortic aneurysm, without rupture: Secondary | ICD-10-CM

## 2013-02-13 ENCOUNTER — Ambulatory Visit: Payer: Medicare Other | Admitting: Neurosurgery

## 2013-02-13 ENCOUNTER — Other Ambulatory Visit: Payer: Self-pay

## 2013-02-13 ENCOUNTER — Encounter (INDEPENDENT_AMBULATORY_CARE_PROVIDER_SITE_OTHER): Payer: Medicare Other | Admitting: *Deleted

## 2013-02-13 DIAGNOSIS — I714 Abdominal aortic aneurysm, without rupture: Secondary | ICD-10-CM

## 2013-02-14 DIAGNOSIS — H40019 Open angle with borderline findings, low risk, unspecified eye: Secondary | ICD-10-CM | POA: Diagnosis not present

## 2013-02-14 DIAGNOSIS — H04129 Dry eye syndrome of unspecified lacrimal gland: Secondary | ICD-10-CM | POA: Diagnosis not present

## 2013-02-14 DIAGNOSIS — H353290 Exudative age-related macular degeneration, unspecified eye, stage unspecified: Secondary | ICD-10-CM | POA: Diagnosis not present

## 2013-02-14 DIAGNOSIS — Z961 Presence of intraocular lens: Secondary | ICD-10-CM | POA: Diagnosis not present

## 2013-02-15 ENCOUNTER — Ambulatory Visit: Payer: Medicare Other | Admitting: Rehabilitative and Restorative Service Providers"

## 2013-02-15 DIAGNOSIS — IMO0001 Reserved for inherently not codable concepts without codable children: Secondary | ICD-10-CM | POA: Diagnosis not present

## 2013-02-15 DIAGNOSIS — R269 Unspecified abnormalities of gait and mobility: Secondary | ICD-10-CM | POA: Diagnosis not present

## 2013-02-19 DIAGNOSIS — H353190 Nonexudative age-related macular degeneration, unspecified eye, stage unspecified: Secondary | ICD-10-CM | POA: Diagnosis not present

## 2013-02-19 DIAGNOSIS — H35059 Retinal neovascularization, unspecified, unspecified eye: Secondary | ICD-10-CM | POA: Diagnosis not present

## 2013-02-19 DIAGNOSIS — H43819 Vitreous degeneration, unspecified eye: Secondary | ICD-10-CM | POA: Diagnosis not present

## 2013-02-19 DIAGNOSIS — H353290 Exudative age-related macular degeneration, unspecified eye, stage unspecified: Secondary | ICD-10-CM | POA: Diagnosis not present

## 2013-02-20 ENCOUNTER — Ambulatory Visit: Payer: Medicare Other | Admitting: Rehabilitative and Restorative Service Providers"

## 2013-02-20 DIAGNOSIS — H353190 Nonexudative age-related macular degeneration, unspecified eye, stage unspecified: Secondary | ICD-10-CM | POA: Diagnosis not present

## 2013-02-20 DIAGNOSIS — H353290 Exudative age-related macular degeneration, unspecified eye, stage unspecified: Secondary | ICD-10-CM | POA: Diagnosis not present

## 2013-02-20 DIAGNOSIS — H35059 Retinal neovascularization, unspecified, unspecified eye: Secondary | ICD-10-CM | POA: Diagnosis not present

## 2013-02-20 DIAGNOSIS — IMO0001 Reserved for inherently not codable concepts without codable children: Secondary | ICD-10-CM | POA: Diagnosis not present

## 2013-02-20 DIAGNOSIS — H43819 Vitreous degeneration, unspecified eye: Secondary | ICD-10-CM | POA: Diagnosis not present

## 2013-02-20 DIAGNOSIS — R269 Unspecified abnormalities of gait and mobility: Secondary | ICD-10-CM | POA: Diagnosis not present

## 2013-02-22 ENCOUNTER — Ambulatory Visit: Payer: Medicare Other | Admitting: Rehabilitative and Restorative Service Providers"

## 2013-02-22 DIAGNOSIS — IMO0001 Reserved for inherently not codable concepts without codable children: Secondary | ICD-10-CM | POA: Diagnosis not present

## 2013-02-22 DIAGNOSIS — R269 Unspecified abnormalities of gait and mobility: Secondary | ICD-10-CM | POA: Diagnosis not present

## 2013-02-27 ENCOUNTER — Ambulatory Visit: Payer: Medicare Other | Admitting: Rehabilitative and Restorative Service Providers"

## 2013-02-27 DIAGNOSIS — IMO0001 Reserved for inherently not codable concepts without codable children: Secondary | ICD-10-CM | POA: Diagnosis not present

## 2013-02-27 DIAGNOSIS — R269 Unspecified abnormalities of gait and mobility: Secondary | ICD-10-CM | POA: Diagnosis not present

## 2013-03-01 ENCOUNTER — Ambulatory Visit: Payer: Medicare Other | Attending: Neurology | Admitting: Rehabilitative and Restorative Service Providers"

## 2013-03-01 DIAGNOSIS — IMO0001 Reserved for inherently not codable concepts without codable children: Secondary | ICD-10-CM | POA: Diagnosis not present

## 2013-03-01 DIAGNOSIS — R269 Unspecified abnormalities of gait and mobility: Secondary | ICD-10-CM | POA: Insufficient documentation

## 2013-03-06 ENCOUNTER — Ambulatory Visit: Payer: Medicare Other | Admitting: Rehabilitative and Restorative Service Providers"

## 2013-03-06 DIAGNOSIS — IMO0001 Reserved for inherently not codable concepts without codable children: Secondary | ICD-10-CM | POA: Diagnosis not present

## 2013-03-06 DIAGNOSIS — R269 Unspecified abnormalities of gait and mobility: Secondary | ICD-10-CM | POA: Diagnosis not present

## 2013-03-08 ENCOUNTER — Ambulatory Visit: Payer: Medicare Other | Admitting: Rehabilitative and Restorative Service Providers"

## 2013-03-08 DIAGNOSIS — IMO0001 Reserved for inherently not codable concepts without codable children: Secondary | ICD-10-CM | POA: Diagnosis not present

## 2013-03-08 DIAGNOSIS — R269 Unspecified abnormalities of gait and mobility: Secondary | ICD-10-CM | POA: Diagnosis not present

## 2013-03-13 ENCOUNTER — Ambulatory Visit: Payer: Medicare Other | Admitting: Rehabilitative and Restorative Service Providers"

## 2013-03-13 DIAGNOSIS — IMO0001 Reserved for inherently not codable concepts without codable children: Secondary | ICD-10-CM | POA: Diagnosis not present

## 2013-03-13 DIAGNOSIS — R269 Unspecified abnormalities of gait and mobility: Secondary | ICD-10-CM | POA: Diagnosis not present

## 2013-03-16 ENCOUNTER — Ambulatory Visit: Payer: Medicare Other | Admitting: Rehabilitative and Restorative Service Providers"

## 2013-03-16 DIAGNOSIS — R269 Unspecified abnormalities of gait and mobility: Secondary | ICD-10-CM | POA: Diagnosis not present

## 2013-03-16 DIAGNOSIS — IMO0001 Reserved for inherently not codable concepts without codable children: Secondary | ICD-10-CM | POA: Diagnosis not present

## 2013-03-19 ENCOUNTER — Encounter: Payer: Medicare Other | Admitting: Rehabilitative and Restorative Service Providers"

## 2013-03-21 ENCOUNTER — Ambulatory Visit: Payer: Medicare Other | Admitting: Rehabilitative and Restorative Service Providers"

## 2013-03-21 DIAGNOSIS — R269 Unspecified abnormalities of gait and mobility: Secondary | ICD-10-CM | POA: Diagnosis not present

## 2013-03-21 DIAGNOSIS — IMO0001 Reserved for inherently not codable concepts without codable children: Secondary | ICD-10-CM | POA: Diagnosis not present

## 2013-03-27 ENCOUNTER — Ambulatory Visit: Payer: Medicare Other | Admitting: Rehabilitative and Restorative Service Providers"

## 2013-03-30 ENCOUNTER — Encounter: Payer: Medicare Other | Admitting: Rehabilitative and Restorative Service Providers"

## 2013-05-01 ENCOUNTER — Other Ambulatory Visit: Payer: Self-pay | Admitting: Neurology

## 2013-05-23 DIAGNOSIS — H40019 Open angle with borderline findings, low risk, unspecified eye: Secondary | ICD-10-CM | POA: Diagnosis not present

## 2013-05-23 DIAGNOSIS — H04129 Dry eye syndrome of unspecified lacrimal gland: Secondary | ICD-10-CM | POA: Diagnosis not present

## 2013-06-07 ENCOUNTER — Encounter: Payer: Self-pay | Admitting: Neurology

## 2013-06-07 DIAGNOSIS — D518 Other vitamin B12 deficiency anemias: Secondary | ICD-10-CM

## 2013-06-07 DIAGNOSIS — G63 Polyneuropathy in diseases classified elsewhere: Secondary | ICD-10-CM

## 2013-06-07 DIAGNOSIS — R6889 Other general symptoms and signs: Secondary | ICD-10-CM

## 2013-06-07 DIAGNOSIS — R269 Unspecified abnormalities of gait and mobility: Secondary | ICD-10-CM

## 2013-06-12 ENCOUNTER — Encounter: Payer: Self-pay | Admitting: Neurology

## 2013-06-12 ENCOUNTER — Ambulatory Visit (INDEPENDENT_AMBULATORY_CARE_PROVIDER_SITE_OTHER): Payer: Medicare Other | Admitting: Neurology

## 2013-06-12 VITALS — BP 161/93 | HR 101 | Wt 204.0 lb

## 2013-06-12 DIAGNOSIS — R269 Unspecified abnormalities of gait and mobility: Secondary | ICD-10-CM | POA: Diagnosis not present

## 2013-06-12 DIAGNOSIS — G609 Hereditary and idiopathic neuropathy, unspecified: Secondary | ICD-10-CM | POA: Diagnosis not present

## 2013-06-12 MED ORDER — DULOXETINE HCL 60 MG PO CPEP: 60.0000 mg | ORAL_CAPSULE | Freq: Every day | ORAL | Status: DC

## 2013-06-12 NOTE — Progress Notes (Signed)
Reason for visit: Peripheral neuropathy  Carl Rios is an 79 y.o. male  History of present illness:  Carl Rios is an 79 year old right-handed white male with a history of a small fiber neuropathy. The patient is on Cymbalta, taking 60 mg at night. The patient indicates that this medication does cause some dizziness, but if he takes it at night, he can minimize the side effects. The patient indicates that this does help his pain, and he is able to rest at night. The patient has undergone physical therapy for gait training, and he believes that this has been helpful. The patient denies any falls since last seen. The patient returns for an evaluation.  Past Medical History  Diagnosis Date  . AAA (abdominal aortic aneurysm) without rupture   . Hyperlipidemia   . Diverticulosis   . Peripheral neuropathy   . GERD (gastroesophageal reflux disease)   . Hx of adenomatous colonic polyps   . Peripheral vascular disease   . Obesity   . Dyslipidemia   . History of colon polyps   . History of prostatitis   . Hearing difficulty     Past Surgical History  Procedure Laterality Date  . Knee surgery      left knee in 1970's  . Colonoscopy  01/24/2012    Procedure: COLONOSCOPY;  Surgeon: Hart Carwin, MD;  Location: [facility] ENDOSCOPY;  Service: Endoscopy;  Laterality: N/A;  . Inguinal hernia repair    . Cataract extraction Bilateral   . Tonsillectomy      Family History  Problem Relation Age of Onset  . Heart disease Father 80    Heart diease before age 59    Social history:  reports that he quit smoking about 54 years ago. He has never used smokeless tobacco. He reports that he drinks about 1.5 ounces of alcohol per week. He reports that he does not use illicit drugs.  Allergies:  Allergies  Allergen Reactions  . Sulfamethoxazole Other (See Comments)    UNKNOWN    Medications:  Current Outpatient Prescriptions on File Prior to Visit  Medication Sig Dispense Refill  .  acetaminophen (TYLENOL) 500 MG tablet Take 1,000 mg by mouth every 6 (six) hours as needed. pain      . Calcium Carbonate-Vitamin D (CALCIUM + D PO) Take 1 tablet by mouth daily.       No current facility-administered medications on file prior to visit.    ROS:  Out of a complete 14 system review of symptoms, the patient complains only of the following symptoms, and all other reviewed systems are negative.  Hearing loss, ringing in the ears Skin rash Urinary incontinence Numbness, dizziness Sleepiness Gait instability  Blood pressure 161/93, pulse 101, weight 204 lb (92.534 kg).  Physical Exam  General: The patient is alert and cooperative at the time of the examination. The patient is moderately to markedly obese.  Skin: No significant peripheral edema is noted.   Neurologic Exam  Cranial nerves: Facial symmetry is present. Speech is normal, no aphasia or dysarthria is noted. Extraocular movements are full. Visual fields are full.  Motor: The patient has good strength in all 4 extremities.  Coordination: The patient has good finger-nose-finger and heel-to-shin bilaterally. The patient appears to have some apraxia with the use of the extremities.  Gait and station: The patient has a normal gait. Tandem gait is slightly unsteady. Romberg is negative. No drift is seen.  Reflexes: Deep tendon reflexes are symmetric.   Assessment/Plan:  1. Peripheral neuropathy, small fiber neuropathy  2. Gait disturbance  This patient is doing relatively well at this point. The patient cannot tolerate higher doses of the Cymbalta. In the future, we may add gabapentin at nighttime if needed. The patient will followup in 6 months. A prescription was given for the 60 mg capsules Cymbalta to take one at night. The patient will continue his balance training exercises.  Marlan Palau MD 06/12/2013 7:35 PM  [facility] [address] [place], [place]  [address]  Phone [phone] Fax [phone]

## 2013-07-31 DIAGNOSIS — R030 Elevated blood-pressure reading, without diagnosis of hypertension: Secondary | ICD-10-CM | POA: Diagnosis not present

## 2013-07-31 DIAGNOSIS — R7301 Impaired fasting glucose: Secondary | ICD-10-CM | POA: Diagnosis not present

## 2013-07-31 DIAGNOSIS — Z6831 Body mass index (BMI) 31.0-31.9, adult: Secondary | ICD-10-CM | POA: Diagnosis not present

## 2013-07-31 DIAGNOSIS — E669 Obesity, unspecified: Secondary | ICD-10-CM | POA: Diagnosis not present

## 2013-07-31 DIAGNOSIS — Z23 Encounter for immunization: Secondary | ICD-10-CM | POA: Diagnosis not present

## 2013-07-31 DIAGNOSIS — G609 Hereditary and idiopathic neuropathy, unspecified: Secondary | ICD-10-CM | POA: Diagnosis not present

## 2013-08-10 ENCOUNTER — Other Ambulatory Visit: Payer: Self-pay | Admitting: Vascular Surgery

## 2013-08-10 DIAGNOSIS — I714 Abdominal aortic aneurysm, without rupture: Secondary | ICD-10-CM | POA: Diagnosis not present

## 2013-08-10 LAB — CREATININE, SERUM: Creat: 0.95 mg/dL (ref 0.50–1.35)

## 2013-08-10 LAB — BUN: BUN: 18 mg/dL (ref 6–23)

## 2013-08-13 ENCOUNTER — Encounter: Payer: Self-pay | Admitting: Vascular Surgery

## 2013-08-14 ENCOUNTER — Ambulatory Visit: Payer: Medicare Other | Admitting: Vascular Surgery

## 2013-08-14 ENCOUNTER — Ambulatory Visit
Admission: RE | Admit: 2013-08-14 | Discharge: 2013-08-14 | Disposition: A | Discharge status: Home / Self Care | Payer: Medicare Other | Source: Ambulatory Visit | Attending: Vascular Surgery | Admitting: Vascular Surgery

## 2013-08-14 ENCOUNTER — Encounter: Payer: Self-pay | Admitting: Vascular Surgery

## 2013-08-14 ENCOUNTER — Ambulatory Visit (INDEPENDENT_AMBULATORY_CARE_PROVIDER_SITE_OTHER): Payer: Medicare Other | Admitting: Vascular Surgery

## 2013-08-14 VITALS — BP 158/90 | HR 99 | Resp 16 | Ht 68.0 in | Wt 207.0 lb

## 2013-08-14 DIAGNOSIS — Z01810 Encounter for preprocedural cardiovascular examination: Secondary | ICD-10-CM | POA: Diagnosis not present

## 2013-08-14 DIAGNOSIS — I714 Abdominal aortic aneurysm, without rupture, unspecified: Secondary | ICD-10-CM | POA: Insufficient documentation

## 2013-08-14 MED ORDER — IOHEXOL 350 MG/ML SOLN
80.0000 mL | Freq: Once | INTRAVENOUS | Status: AC | PRN
  Administered 2013-08-14: 80 mL via INTRAVENOUS

## 2013-08-14 NOTE — Progress Notes (Signed)
Subjective:     Patient ID: Carl Rios, male   DOB: 02/04/1926, 79 y.o.   MRN: [id number]  HPI this 79-year-old male is seen today in continued followup regarding his abdominal aortic aneurysm which I have been following. It has been in the 4.7 cm range most recently in April of 2014. Today he had a CT angiogram performed in the aneurysm now measures 5.3 cm in maximum diameter.  Past Medical History  Diagnosis Date  . AAA (abdominal aortic aneurysm) without rupture   . Hyperlipidemia   . Diverticulosis   . Peripheral neuropathy   . GERD (gastroesophageal reflux disease)   . Hx of adenomatous colonic polyps   . Peripheral vascular disease   . Obesity   . Dyslipidemia   . History of colon polyps   . History of prostatitis   . Hearing difficulty     History  Substance Use Topics  . Smoking status: Former Smoker -- 5 years    Quit date: 01/18/1959  . Smokeless tobacco: Never Used  . Alcohol Use: 1.5 oz/week    3 drink(s) per week     Comment: 3 drinks per week    Family History  Problem Relation Age of Onset  . Heart disease Father 38    Heart diease before age 60    Allergies  Allergen Reactions  . Sulfamethoxazole Other (See Comments)    UNKNOWN    Current outpatient prescriptions:acetaminophen (TYLENOL) 500 MG tablet, Take 1,000 mg by mouth every 6 (six) hours as needed. pain, Disp: , Rfl: ;  Calcium Carbonate-Vitamin D (CALCIUM + D PO), Take 1 tablet by mouth daily., Disp: , Rfl: ;  DULoxetine (CYMBALTA) 60 MG capsule, Take 1 capsule (60 mg total) by mouth at bedtime., Disp: 30 capsule, Rfl: 5  BP 158/90  Pulse 99  Resp 16  Ht 5' 8" (1.727 m)  Wt 207 lb (93.895 kg)  BMI 31.48 kg/m2  Body mass index is 31.48 kg/(m^2).          Review of Systems very poor hearing. Denies any chest pain, dyspnea on exertion, PND, orthopnea, claudication. All other systems negative and complete review of systems     Objective:   Physical Exam BP 158/90  Pulse 99  Resp  16  Ht 5' 8" (1.727 m)  Wt 207 lb (93.895 kg)  BMI 31.48 kg/m2  Gen.-alert and oriented x3 in no apparent distress HEENT normal for age Lungs no rhonchi or wheezing Cardiovascular regular rhythm no murmurs carotid pulses 3+ palpable no bruits audible Abdomen soft nontender no palpable masses-obese Musculoskeletal free of  major deformities Skin clear -no rashes Neurologic normal Lower extremities 3+ femoral and dorsalis pedis pulses palpable bilaterally with no edema  Today I reviewed the CT angiogram by computer. Patient does have a 5.3 cm infrarenal abdominal aortic aneurysm which appears to be a good candidate for aortic stent grafting       Assessment:     Abdominal aortic aneurysm-5.3 cm in maximum diameter    Plan:     #1 plan Cardiolite this week #2 plan aortobifem and iliac stent graft insertion on Thursday, October 23-risks and benefits thoroughly discussed and patient would like to proceed      

## 2013-08-15 ENCOUNTER — Telehealth: Payer: Self-pay | Admitting: Vascular Surgery

## 2013-08-15 NOTE — Telephone Encounter (Signed)
Lm on pt's hm # to inform them of their cardiolyte appt at [facility] ([address], [address], ph # [phone]) on 08/17/13 at 1 pm. I gave them the address, ph # and instructions (nothing to eat or drink 2 hrs before, no caffine 12 hrs before) and informed them a nurse from [facility] will be calling them to go over their medications.

## 2013-08-15 NOTE — Addendum Note (Signed)
Addended by: Sharee Pimple on: 08/15/2013 08:49 AM   Modules accepted: Orders

## 2013-08-16 ENCOUNTER — Other Ambulatory Visit: Payer: Self-pay

## 2013-08-17 ENCOUNTER — Ambulatory Visit (HOSPITAL_COMMUNITY)
Admission: RE | Admit: 2013-08-17 | Discharge: 2013-08-17 | Disposition: A | Discharge status: Home / Self Care | Payer: Medicare Other | Source: Ambulatory Visit | Attending: Vascular Surgery | Admitting: Vascular Surgery

## 2013-08-17 DIAGNOSIS — I714 Abdominal aortic aneurysm, without rupture, unspecified: Secondary | ICD-10-CM | POA: Insufficient documentation

## 2013-08-17 DIAGNOSIS — Z01810 Encounter for preprocedural cardiovascular examination: Secondary | ICD-10-CM | POA: Diagnosis not present

## 2013-08-17 MED ORDER — TECHNETIUM TC 99M SESTAMIBI GENERIC - CARDIOLITE
31.2000 | Freq: Once | INTRAVENOUS | Status: AC | PRN
  Administered 2013-08-17: 31.2 via INTRAVENOUS

## 2013-08-17 MED ORDER — REGADENOSON 0.4 MG/5ML IV SOLN
0.4000 mg | Freq: Once | INTRAVENOUS | Status: AC
  Administered 2013-08-17: 0.4 mg via INTRAVENOUS

## 2013-08-17 MED ORDER — TECHNETIUM TC 99M SESTAMIBI GENERIC - CARDIOLITE
10.1000 | Freq: Once | INTRAVENOUS | Status: AC | PRN
  Administered 2013-08-17: 10.1 via INTRAVENOUS

## 2013-08-17 NOTE — Procedures (Addendum)
[facility] [address] [place] [address] [place] [address] [phone]  Cardiology Nuclear Med Study  Carl Rios is a 79 y.o. male     MRN : [id number]     DOB: 27-Jan-1926  Procedure Date: 08/17/2013  Nuclear Med Background Indication for Stress Test:  Surgical Clearance History:  No prior history reported. Cardiac Risk Factors: Family History - CAD, History of Smoking, Hypertension, Lipids, Obesity, PVD and AAA  Symptoms:  Dizziness and DOE   Nuclear Pre-Procedure Caffeine/Decaff Intake:  12:00am NPO After: 10 am   IV Site: R Hand  IV 0.9% NS with Angio Cath:  22g  Chest Size (in):  44"  IV Started by: Emmit Pomfret, RN  Height: 5\' 8"  (1.727 m)  Cup Size: n/a  BMI:  Body mass index is 31.48 kg/(m^2). Weight:  207 lb (93.895 kg)   Tech Comments:  n/a    Nuclear Med Study 1 or 2 day study: 1 day  Stress Test Type:  Lexiscan  Order Authorizing Provider:  Josephina Gip, MD   Resting Radionuclide: Technetium 34m Sestamibi  Resting Radionuclide Dose: 10.1 mCi   Stress Radionuclide:  Technetium 75m Sestamibi  Stress Radionuclide Dose: 31.2 mCi           Stress Protocol Rest HR: 75 Stress HR: 85  Rest BP: 179/99 Stress BP: 164/113  Exercise Time (min): n/a METS: n/a   Predicted Max HR: 135 bpm % Max HR: 62.96 bpm Rate Pressure Product: 27253  Dose of Adenosine (mg):  n/a Dose of Lexiscan: 0.4 mg  Dose of Atropine (mg): n/a Dose of Dobutamine: n/a mcg/kg/min (at max HR)  Stress Test Technologist: Esperanza Sheets, CCT Nuclear Technologist: Gonzella Lex, CNMT   Rest Procedure:  Myocardial perfusion imaging was performed at rest 45 minutes following the intravenous administration of Technetium 53m Sestamibi. Stress Procedure:  The patient received IV Lexiscan 0.4 mg over 15-seconds.  Technetium 110m Sestamibi injected at 30-seconds.  There were no significant changes with Lexiscan.  Quantitative spect images were obtained after  a 45 minute delay.  Transient Ischemic Dilatation (Normal <1.22):  1.14 Lung/Heart Ratio (Normal <0.45):  0.29 QGS EDV:  58 ml QGS ESV:  23 ml LV Ejection Fraction: 60%  Signed by     Rest ECG: NSR - Normal EKG  Stress ECG: No significant change from baseline ECG  QPS Raw Data Images:  Normal; no motion artifact; normal heart/lung ratio. Stress Images:  Normal homogeneous uptake in all areas of the myocardium. Rest Images:  Normal homogeneous uptake in all areas of the myocardium. Subtraction (SDS):  No evidence of ischemia.  Impression Exercise Capacity:  Lexiscan with no exercise. BP Response:  Normal blood pressure response. Clinical Symptoms:  No significant symptoms noted. ECG Impression:  No significant ST segment change suggestive of ischemia. Comparison with Prior Nuclear Study: No images to compare  Overall Impression:  Normal stress nuclear study.  LV Wall Motion:  NL LV Function; NL Wall Motion   Runell Gess, MD  08/17/2013 6:18 PM

## 2013-08-20 ENCOUNTER — Encounter (HOSPITAL_COMMUNITY): Payer: Self-pay | Admitting: Pharmacy Technician

## 2013-08-21 ENCOUNTER — Ambulatory Visit (HOSPITAL_COMMUNITY)
Admission: RE | Admit: 2013-08-21 | Discharge: 2013-08-21 | Disposition: A | Discharge status: Home / Self Care | Payer: Medicare Other | Source: Ambulatory Visit | Attending: Vascular Surgery | Admitting: Vascular Surgery

## 2013-08-21 ENCOUNTER — Encounter (HOSPITAL_COMMUNITY): Payer: Self-pay

## 2013-08-21 ENCOUNTER — Encounter (HOSPITAL_COMMUNITY)
Admission: RE | Admit: 2013-08-21 | Discharge: 2013-08-21 | Disposition: A | Discharge status: Home / Self Care | Payer: Medicare Other | Source: Ambulatory Visit | Attending: Vascular Surgery | Admitting: Vascular Surgery

## 2013-08-21 DIAGNOSIS — J984 Other disorders of lung: Secondary | ICD-10-CM | POA: Insufficient documentation

## 2013-08-21 DIAGNOSIS — I714 Abdominal aortic aneurysm, without rupture, unspecified: Secondary | ICD-10-CM | POA: Insufficient documentation

## 2013-08-21 DIAGNOSIS — R911 Solitary pulmonary nodule: Secondary | ICD-10-CM | POA: Insufficient documentation

## 2013-08-21 DIAGNOSIS — E785 Hyperlipidemia, unspecified: Secondary | ICD-10-CM | POA: Insufficient documentation

## 2013-08-21 DIAGNOSIS — I700 Atherosclerosis of aorta: Secondary | ICD-10-CM | POA: Insufficient documentation

## 2013-08-21 DIAGNOSIS — K219 Gastro-esophageal reflux disease without esophagitis: Secondary | ICD-10-CM | POA: Insufficient documentation

## 2013-08-21 DIAGNOSIS — E669 Obesity, unspecified: Secondary | ICD-10-CM | POA: Insufficient documentation

## 2013-08-21 DIAGNOSIS — Z01810 Encounter for preprocedural cardiovascular examination: Secondary | ICD-10-CM | POA: Insufficient documentation

## 2013-08-21 DIAGNOSIS — Z01818 Encounter for other preprocedural examination: Secondary | ICD-10-CM | POA: Diagnosis not present

## 2013-08-21 DIAGNOSIS — Z01812 Encounter for preprocedural laboratory examination: Secondary | ICD-10-CM | POA: Insufficient documentation

## 2013-08-21 LAB — URINALYSIS, ROUTINE W REFLEX MICROSCOPIC
Bilirubin Urine: NEGATIVE
Glucose, UA: NEGATIVE mg/dL
Hgb urine dipstick: NEGATIVE
Ketones, ur: 15 mg/dL — AB
Protein, ur: NEGATIVE mg/dL
pH: 5.5 (ref 5.0–8.0)

## 2013-08-21 LAB — CBC
HCT: 44.5 % (ref 39.0–52.0)
MCH: 30.5 pg (ref 26.0–34.0)
MCHC: 34.2 g/dL (ref 30.0–36.0)
Platelets: 258 10*3/uL (ref 150–400)
RBC: 4.99 MIL/uL (ref 4.22–5.81)

## 2013-08-21 LAB — BLOOD GAS, ARTERIAL
Drawn by: 206361
O2 Saturation: 95.7 %
Patient temperature: 98.6

## 2013-08-21 LAB — COMPREHENSIVE METABOLIC PANEL
ALT: 17 U/L (ref 0–53)
AST: 21 U/L (ref 0–37)
BUN: 18 mg/dL (ref 6–23)
CO2: 19 mEq/L (ref 19–32)
Calcium: 9.1 mg/dL (ref 8.4–10.5)
Creatinine, Ser: 0.83 mg/dL (ref 0.50–1.35)
GFR calc Af Amer: >90 mL/min (ref >90)
Glucose, Bld: 103 mg/dL — ABNORMAL HIGH (ref 70–99)
Sodium: 136 mEq/L (ref 135–145)
Total Bilirubin: 0.4 mg/dL (ref 0.3–1.2)
Total Protein: 6.7 g/dL (ref 6.0–8.3)

## 2013-08-21 LAB — PREPARE RBC (CROSSMATCH)

## 2013-08-21 LAB — SURGICAL PCR SCREEN: Staphylococcus aureus: NEGATIVE

## 2013-08-21 LAB — APTT: aPTT: 32 seconds (ref 24–37)

## 2013-08-21 LAB — ABO/RH: ABO/RH(D): B POS

## 2013-08-21 NOTE — Pre-Procedure Instructions (Signed)
ABDOULIE TIERCE  08/21/2013   Your procedure is scheduled on:    Thursday    08/23/13  Report to [facility]  2 * 3 at 530  AM.  Call this number if you have problems the morning of surgery: [phone]   Remember:   Do not eat food or drink liquids after midnight.   Take these medicines the morning of surgery with A SIP OF WATER:  TYLENOL IF NEEDED, CYMBALTA   Do not wear jewelry, make-up or nail polish.  Do not wear lotions, powders, or perfumes. You may wear deodorant.  Do not shave 48 hours prior to surgery. Men may shave face and neck.  Do not bring valuables to the hospital.  [facility] is not responsible                  for any belongings or valuables.               Contacts, dentures or bridgework may not be worn into surgery.  Leave suitcase in the car. After surgery it may be brought to your room.  For patients admitted to the hospital, discharge time is determined by your                treatment team.               Patients discharged the day of surgery will not be allowed to drive  home.  Name and phone number of your driver:   Special Instructions: Shower using CHG 2 nights before surgery and the night before surgery.  If you shower the day of surgery use CHG.  Use special wash - you have one bottle of CHG for all showers.  You should use approximately 1/3 of the bottle for each shower.   Please read over the following fact sheets that you were given: Pain Booklet, Coughing and Deep Breathing, Blood Transfusion Information, MRSA Information and Surgical Site Infection Prevention

## 2013-08-22 MED ORDER — DEXTROSE 5 % IV SOLN
1.5000 g | INTRAVENOUS | Status: AC
  Administered 2013-08-23: 1.5 g via INTRAVENOUS
  Filled 2013-08-22: qty 1.5

## 2013-08-22 NOTE — Progress Notes (Addendum)
Anesthesia Chart Review: Patient is a 79 year old male scheduled for EVAR of AAA on 08/23/13 by Dr. Hart Rochester.  History includes AAA, HLD, GERD, obesity, former smoker, dyslipidemia, peripheral neuropathy. PCP is Dr. Lacretia Nicks. Buren Kos.  Preoperative labs noted.  EKG on 08/21/13 showed NSR, LAD, inferior infarct (age undetermined).  Nuclear stress test on 08/17/13 showed: Normal stress nuclear study. LV Wall Motion: NL LV Function; NL Wall Motion. EF 60%.  CXR on 08/21/13 showed: 1. 3 mm left basilar pulmonary nodule. This is probably a small intrapulmonary lymph node given the proximity to the major fissure. If the patient is at high risk for bronchogenic carcinoma, follow-up chest CT at 1year is recommended. If the patient is at low risk, no follow-up is needed. This recommendation follows the consensus statement: Guidelines for Management of Small Pulmonary Nodules Detected on CT Scans: A Statement from the Fleischner Society as published in Radiology 2005; 237:395-400.  2. Atherosclerosis. I have routed results to Dr. Hart Rochester and [facility] nurses Darel Hong and Okey Regal.  Will defer follow-up recommendations to Dr. Hart Rochester.  Findings should not interfere with proceeding to OR.  Velna [facility]/Anesthesiology Phone [phone] 08/22/2013 10:41 AM  Addendum: 08/23/2013 11:08 AM From: Lars Mage, PA-C  Sent: 08/23/2013 10:40 AM  To: Melene Plan, RN   He is aware and there will not be any follow up study he is at low risk. Thanks [facility]

## 2013-08-23 ENCOUNTER — Inpatient Hospital Stay (HOSPITAL_COMMUNITY): Payer: Medicare Other | Admitting: Anesthesiology

## 2013-08-23 ENCOUNTER — Encounter (HOSPITAL_COMMUNITY)
Admission: RE | Disposition: A | Discharge status: Home / Self Care | Payer: Self-pay | Source: Ambulatory Visit | Attending: Vascular Surgery

## 2013-08-23 ENCOUNTER — Inpatient Hospital Stay (HOSPITAL_COMMUNITY)
Admission: RE | Admit: 2013-08-23 | Discharge: 2013-08-24 | DRG: 238 | Disposition: A | Discharge status: Home / Self Care | Payer: Medicare Other | Source: Ambulatory Visit | Attending: Vascular Surgery | Admitting: Vascular Surgery

## 2013-08-23 ENCOUNTER — Encounter (HOSPITAL_COMMUNITY): Payer: Self-pay

## 2013-08-23 ENCOUNTER — Inpatient Hospital Stay (HOSPITAL_COMMUNITY): Payer: Medicare Other

## 2013-08-23 ENCOUNTER — Encounter (HOSPITAL_COMMUNITY): Payer: Medicare Other | Admitting: Vascular Surgery

## 2013-08-23 DIAGNOSIS — E785 Hyperlipidemia, unspecified: Secondary | ICD-10-CM | POA: Diagnosis present

## 2013-08-23 DIAGNOSIS — I739 Peripheral vascular disease, unspecified: Secondary | ICD-10-CM | POA: Diagnosis not present

## 2013-08-23 DIAGNOSIS — J90 Pleural effusion, not elsewhere classified: Secondary | ICD-10-CM | POA: Diagnosis not present

## 2013-08-23 DIAGNOSIS — Z87891 Personal history of nicotine dependence: Secondary | ICD-10-CM

## 2013-08-23 DIAGNOSIS — I714 Abdominal aortic aneurysm, without rupture, unspecified: Secondary | ICD-10-CM | POA: Diagnosis not present

## 2013-08-23 DIAGNOSIS — J9819 Other pulmonary collapse: Secondary | ICD-10-CM | POA: Diagnosis not present

## 2013-08-23 DIAGNOSIS — I10 Essential (primary) hypertension: Secondary | ICD-10-CM | POA: Diagnosis present

## 2013-08-23 DIAGNOSIS — K5730 Diverticulosis of large intestine without perforation or abscess without bleeding: Secondary | ICD-10-CM | POA: Diagnosis present

## 2013-08-23 DIAGNOSIS — K219 Gastro-esophageal reflux disease without esophagitis: Secondary | ICD-10-CM | POA: Diagnosis present

## 2013-08-23 HISTORY — PX: ABDOMINAL AORTIC ENDOVASCULAR STENT GRAFT: SHX5707

## 2013-08-23 LAB — TYPE AND SCREEN
ABO/RH(D): B POS
Antibody Screen: NEGATIVE
Unit division: 0

## 2013-08-23 LAB — BASIC METABOLIC PANEL
BUN: 19 mg/dL (ref 6–23)
Calcium: 8.3 mg/dL — ABNORMAL LOW (ref 8.4–10.5)
Creatinine, Ser: 0.76 mg/dL (ref 0.50–1.35)
GFR calc non Af Amer: 81 mL/min — ABNORMAL LOW (ref >90)
Glucose, Bld: 172 mg/dL — ABNORMAL HIGH (ref 70–99)
Potassium: 3.8 mEq/L (ref 3.5–5.1)
Sodium: 137 mEq/L (ref 135–145)

## 2013-08-23 LAB — MAGNESIUM: Magnesium: 2 mg/dL (ref 1.5–2.5)

## 2013-08-23 LAB — APTT: aPTT: 32 s (ref 24–37)

## 2013-08-23 LAB — CBC
HCT: 35.6 % — ABNORMAL LOW (ref 39.0–52.0)
Hemoglobin: 11.8 g/dL — ABNORMAL LOW (ref 13.0–17.0)
MCH: 29.6 pg (ref 26.0–34.0)
MCHC: 33.1 g/dL (ref 30.0–36.0)
MCV: 89.2 fL (ref 78.0–100.0)

## 2013-08-23 SURGERY — INSERTION, ENDOVASCULAR STENT GRAFT, AORTA, ABDOMINAL
Anesthesia: General | Site: Abdomen | Wound class: Clean

## 2013-08-23 MED ORDER — HEPARIN SODIUM (PORCINE) 1000 UNIT/ML IJ SOLN
INTRAMUSCULAR | Status: DC | PRN
  Administered 2013-08-23: 6000 [IU] via INTRAVENOUS

## 2013-08-23 MED ORDER — LACTATED RINGERS IV SOLN
INTRAVENOUS | Status: DC | PRN
  Administered 2013-08-23 (×2): via INTRAVENOUS

## 2013-08-23 MED ORDER — NAPHAZOLINE HCL 0.1 % OP SOLN
2.0000 [drp] | Freq: Four times a day (QID) | OPHTHALMIC | Status: DC | PRN
  Administered 2013-08-23: 2 [drp] via OPHTHALMIC
  Filled 2013-08-23 (×2): qty 15

## 2013-08-23 MED ORDER — DOPAMINE-DEXTROSE 3.2-5 MG/ML-% IV SOLN: 3.0000 ug/kg/min | INTRAVENOUS | Status: DC

## 2013-08-23 MED ORDER — DULOXETINE HCL 30 MG PO CPEP
30.0000 mg | ORAL_CAPSULE | Freq: Every day | ORAL | Status: DC
  Administered 2013-08-23 – 2013-08-24 (×2): 30 mg via ORAL
  Filled 2013-08-23 (×2): qty 1

## 2013-08-23 MED ORDER — BISACODYL 10 MG RE SUPP: 10.0000 mg | Freq: Every day | RECTAL | Status: DC | PRN

## 2013-08-23 MED ORDER — FENTANYL CITRATE 0.05 MG/ML IJ SOLN
INTRAMUSCULAR | Status: DC | PRN
  Administered 2013-08-23: 50 ug via INTRAVENOUS
  Administered 2013-08-23 (×2): 100 ug via INTRAVENOUS
  Administered 2013-08-23: 50 ug via INTRAVENOUS

## 2013-08-23 MED ORDER — GLYCOPYRROLATE 0.2 MG/ML IJ SOLN
INTRAMUSCULAR | Status: DC | PRN
  Administered 2013-08-23: 0.2 mg via INTRAVENOUS
  Administered 2013-08-23: 0.6 mg via INTRAVENOUS

## 2013-08-23 MED ORDER — LIDOCAINE HCL (CARDIAC) 20 MG/ML IV SOLN
INTRAVENOUS | Status: DC | PRN
  Administered 2013-08-23: 80 mg via INTRAVENOUS

## 2013-08-23 MED ORDER — ALBUMIN HUMAN 5 % IV SOLN
INTRAVENOUS | Status: DC | PRN
  Administered 2013-08-23: 09:00:00 via INTRAVENOUS

## 2013-08-23 MED ORDER — ACETAMINOPHEN 650 MG RE SUPP: 325.0000 mg | RECTAL | Status: DC | PRN

## 2013-08-23 MED ORDER — SUCCINYLCHOLINE CHLORIDE 20 MG/ML IJ SOLN
INTRAMUSCULAR | Status: DC | PRN
  Administered 2013-08-23: 100 mg via INTRAVENOUS

## 2013-08-23 MED ORDER — SODIUM CHLORIDE 0.9 % IV SOLN: 500.0000 mL | Freq: Once | INTRAVENOUS | Status: AC | PRN

## 2013-08-23 MED ORDER — FENTANYL CITRATE 0.05 MG/ML IJ SOLN: 25.0000 ug | INTRAMUSCULAR | Status: DC | PRN

## 2013-08-23 MED ORDER — MAGNESIUM SULFATE 40 MG/ML IJ SOLN
2.0000 g | Freq: Once | INTRAMUSCULAR | Status: AC | PRN
  Filled 2013-08-23: qty 50

## 2013-08-23 MED ORDER — SODIUM CHLORIDE 0.9 % IV SOLN: INTRAVENOUS | Status: DC

## 2013-08-23 MED ORDER — ONDANSETRON HCL 4 MG/2ML IJ SOLN
4.0000 mg | Freq: Four times a day (QID) | INTRAMUSCULAR | Status: DC | PRN
  Administered 2013-08-23: 4 mg via INTRAVENOUS

## 2013-08-23 MED ORDER — MORPHINE SULFATE 2 MG/ML IJ SOLN: 2.0000 mg | INTRAMUSCULAR | Status: DC | PRN

## 2013-08-23 MED ORDER — NEOSTIGMINE METHYLSULFATE 1 MG/ML IJ SOLN
INTRAMUSCULAR | Status: DC | PRN
  Administered 2013-08-23: 3 mg via INTRAVENOUS

## 2013-08-23 MED ORDER — HYDRALAZINE HCL 20 MG/ML IJ SOLN: 10.0000 mg | INTRAMUSCULAR | Status: DC | PRN

## 2013-08-23 MED ORDER — DEXTROSE 5 % IV SOLN
10.0000 mg | INTRAVENOUS | Status: DC | PRN
  Administered 2013-08-23: 25 ug/min via INTRAVENOUS

## 2013-08-23 MED ORDER — LABETALOL HCL 5 MG/ML IV SOLN: 10.0000 mg | INTRAVENOUS | Status: DC | PRN

## 2013-08-23 MED ORDER — 0.9 % SODIUM CHLORIDE (POUR BTL) OPTIME
TOPICAL | Status: DC | PRN
  Administered 2013-08-23: 1000 mL

## 2013-08-23 MED ORDER — IODIXANOL 320 MG/ML IV SOLN
INTRAVENOUS | Status: DC | PRN
  Administered 2013-08-23: 70.4 mL via INTRAVENOUS

## 2013-08-23 MED ORDER — SODIUM CHLORIDE 0.9 % IR SOLN
Status: DC | PRN
  Administered 2013-08-23 (×2)

## 2013-08-23 MED ORDER — ROCURONIUM BROMIDE 100 MG/10ML IV SOLN
INTRAVENOUS | Status: DC | PRN
  Administered 2013-08-23: 30 mg via INTRAVENOUS
  Administered 2013-08-23: 20 mg via INTRAVENOUS

## 2013-08-23 MED ORDER — PANTOPRAZOLE SODIUM 40 MG PO TBEC
40.0000 mg | DELAYED_RELEASE_TABLET | Freq: Every day | ORAL | Status: DC
  Administered 2013-08-24: 40 mg via ORAL
  Filled 2013-08-23: qty 1

## 2013-08-23 MED ORDER — METOPROLOL TARTRATE 1 MG/ML IV SOLN: 2.0000 mg | INTRAVENOUS | Status: DC | PRN

## 2013-08-23 MED ORDER — DOCUSATE SODIUM 100 MG PO CAPS
100.0000 mg | ORAL_CAPSULE | Freq: Every day | ORAL | Status: DC
  Administered 2013-08-24: 100 mg via ORAL
  Filled 2013-08-23: qty 1

## 2013-08-23 MED ORDER — ALUM & MAG HYDROXIDE-SIMETH 200-200-20 MG/5ML PO SUSP: 15.0000 mL | ORAL | Status: DC | PRN

## 2013-08-23 MED ORDER — POTASSIUM CHLORIDE CRYS ER 20 MEQ PO TBCR: 20.0000 meq | EXTENDED_RELEASE_TABLET | Freq: Once | ORAL | Status: AC | PRN

## 2013-08-23 MED ORDER — ACETAMINOPHEN 325 MG PO TABS: 325.0000 mg | ORAL_TABLET | ORAL | Status: DC | PRN

## 2013-08-23 MED ORDER — PHENOL 1.4 % MT LIQD: 1.0000 | OROMUCOSAL | Status: DC | PRN

## 2013-08-23 MED ORDER — ENOXAPARIN SODIUM 40 MG/0.4ML ~~LOC~~ SOLN
40.0000 mg | SUBCUTANEOUS | Status: DC
  Filled 2013-08-23 (×2): qty 0.4

## 2013-08-23 MED ORDER — GUAIFENESIN-DM 100-10 MG/5ML PO SYRP: 15.0000 mL | ORAL_SOLUTION | ORAL | Status: DC | PRN

## 2013-08-23 MED ORDER — SENNOSIDES-DOCUSATE SODIUM 8.6-50 MG PO TABS
1.0000 | ORAL_TABLET | Freq: Every evening | ORAL | Status: DC | PRN
  Filled 2013-08-23: qty 1

## 2013-08-23 MED ORDER — PROTAMINE SULFATE 10 MG/ML IV SOLN
INTRAVENOUS | Status: DC | PRN
  Administered 2013-08-23: 10 mg via INTRAVENOUS

## 2013-08-23 MED ORDER — PROPOFOL 10 MG/ML IV BOLUS
INTRAVENOUS | Status: DC | PRN
  Administered 2013-08-23: 180 mg via INTRAVENOUS

## 2013-08-23 MED ORDER — ONDANSETRON HCL 4 MG/2ML IJ SOLN
INTRAMUSCULAR | Status: AC
  Filled 2013-08-23: qty 2

## 2013-08-23 MED ORDER — OXYCODONE-ACETAMINOPHEN 5-325 MG PO TABS: 1.0000 | ORAL_TABLET | ORAL | Status: DC | PRN

## 2013-08-23 MED ORDER — DEXTROSE 5 % IV SOLN
1.5000 g | Freq: Two times a day (BID) | INTRAVENOUS | Status: AC
  Administered 2013-08-23 – 2013-08-24 (×2): 1.5 g via INTRAVENOUS
  Filled 2013-08-23 (×2): qty 1.5

## 2013-08-23 MED ORDER — EPHEDRINE SULFATE 50 MG/ML IJ SOLN
INTRAMUSCULAR | Status: DC | PRN
  Administered 2013-08-23: 10 mg via INTRAVENOUS

## 2013-08-23 MED ORDER — SODIUM CHLORIDE 0.9 % IV SOLN
INTRAVENOUS | Status: DC
  Administered 2013-08-23 (×2): via INTRAVENOUS

## 2013-08-23 SURGICAL SUPPLY — 78 items
BAG BANDED W/RUBBER/TAPE 36X54 (MISCELLANEOUS) ×2 IMPLANT
BAG SNAP BAND KOVER 36X36 (MISCELLANEOUS) ×2 IMPLANT
CANISTER SUCTION 2500CC (MISCELLANEOUS) ×2 IMPLANT
CATH BEACON 5.038 65CM KMP-01 (CATHETERS) ×2 IMPLANT
CATH OMNI FLUSH .035X70CM (CATHETERS) ×2 IMPLANT
CLIP TI MEDIUM 24 (CLIP) ×2 IMPLANT
CLIP TI WIDE RED SMALL 24 (CLIP) ×2 IMPLANT
COVER DOME SNAP 22 D (MISCELLANEOUS) ×2 IMPLANT
COVER MAYO STAND STRL (DRAPES) ×2 IMPLANT
COVER PROBE W GEL 5X96 (DRAPES) ×2 IMPLANT
COVER SURGICAL LIGHT HANDLE (MISCELLANEOUS) ×2 IMPLANT
DERMABOND ADVANCED (GAUZE/BANDAGES/DRESSINGS) ×2
DERMABOND ADVANCED .7 DNX12 (GAUZE/BANDAGES/DRESSINGS) ×2 IMPLANT
DEVICE CLOSURE PERCLS PRGLD 6F (VASCULAR PRODUCTS) ×7 IMPLANT
DEVICE TORQUE KENDALL .025-038 (MISCELLANEOUS) ×2 IMPLANT
DRAPE TABLE COVER HEAVY DUTY (DRAPES) ×2 IMPLANT
DRESSING OPSITE X SMALL 2X3 (GAUZE/BANDAGES/DRESSINGS) ×4 IMPLANT
DRYSEAL FLEXSHEATH 12FR 33CM (SHEATH) ×1
DRYSEAL FLEXSHEATH 18FR 33CM (SHEATH) ×1
ELECT CAUTERY BLADE 6.4 (BLADE) IMPLANT
ELECT REM PT RETURN 9FT ADLT (ELECTROSURGICAL) ×4
ELECTRODE REM PT RTRN 9FT ADLT (ELECTROSURGICAL) ×2 IMPLANT
EXCLUDER TNK LEG 31MX14X15 (Endovascular Graft) ×1 IMPLANT
EXCLUDER TRUNK LEG 31MX14X15 (Endovascular Graft) ×2 IMPLANT
GAUZE SPONGE 2X2 8PLY STRL LF (GAUZE/BANDAGES/DRESSINGS) ×2 IMPLANT
GLOVE ECLIPSE 6.5 STRL STRAW (GLOVE) ×2 IMPLANT
GLOVE SS BIOGEL STRL SZ 7 (GLOVE) ×1 IMPLANT
GLOVE SUPERSENSE BIOGEL SZ 7 (GLOVE) ×1
GOWN STRL NON-REIN LRG LVL3 (GOWN DISPOSABLE) ×6 IMPLANT
GRAFT BALLN CATH 65CM (STENTS) ×1 IMPLANT
GRAFT EXCLUDER LEG 12X12 (Endovascular Graft) ×2 IMPLANT
GUIDEWIRE ANGLED .035X150CM (WIRE) ×2 IMPLANT
KIT BASIN OR (CUSTOM PROCEDURE TRAY) ×2 IMPLANT
KIT ROOM TURNOVER OR (KITS) ×2 IMPLANT
LOOP VESSEL MAXI BLUE (MISCELLANEOUS) ×4 IMPLANT
NEEDLE PERC 18GX7CM (NEEDLE) ×2 IMPLANT
NS IRRIG 1000ML POUR BTL (IV SOLUTION) ×2 IMPLANT
PACK AORTA (CUSTOM PROCEDURE TRAY) ×2 IMPLANT
PAD ARMBOARD 7.5X6 YLW CONV (MISCELLANEOUS) ×4 IMPLANT
PENCIL BUTTON HOLSTER BLD 10FT (ELECTRODE) IMPLANT
PERCLOSE PROGLIDE 6F (VASCULAR PRODUCTS) ×14
PROTECTION STATION PRESSURIZED (MISCELLANEOUS)
SHEATH AVANTI 11CM 8FR (MISCELLANEOUS) ×2 IMPLANT
SHEATH BRITE TIP 8FR 23CM (MISCELLANEOUS) ×2 IMPLANT
SHEATH DRYSEAL FLEX 12FR 33CM (SHEATH) ×1 IMPLANT
SHEATH DRYSEAL FLEX 18FR 33CM (SHEATH) ×1 IMPLANT
SPONGE GAUZE 2X2 STER 10/PKG (GAUZE/BANDAGES/DRESSINGS) ×2
STATION PROTECTION PRESSURIZED (MISCELLANEOUS) IMPLANT
STENT GRAFT BALLN CATH 65CM (STENTS) ×1
STOPCOCK MORSE 400PSI 3WAY (MISCELLANEOUS) ×2 IMPLANT
SUT PROLENE 5 0 C 1 24 (SUTURE) ×2 IMPLANT
SUT PROLENE 5 0 CC 1 (SUTURE) IMPLANT
SUT PROLENE 6 0 BV (SUTURE) ×2 IMPLANT
SUT PROLENE 6 0 C 1 30 (SUTURE) IMPLANT
SUT PROLENE 6 0 CC (SUTURE) ×2 IMPLANT
SUT SILK 2 0 (SUTURE) ×1
SUT SILK 2-0 18XBRD TIE 12 (SUTURE) ×1 IMPLANT
SUT SILK 3 0 (SUTURE) ×1
SUT SILK 3-0 18XBRD TIE 12 (SUTURE) ×1 IMPLANT
SUT SILK 4 0 (SUTURE) ×1
SUT SILK 4-0 18XBRD TIE 12 (SUTURE) ×1 IMPLANT
SUT VIC AB 2-0 CT1 27 (SUTURE)
SUT VIC AB 2-0 CT1 TAPERPNT 27 (SUTURE) IMPLANT
SUT VIC AB 2-0 CTX 27 (SUTURE) ×2 IMPLANT
SUT VIC AB 3-0 SH 27 (SUTURE) ×1
SUT VIC AB 3-0 SH 27X BRD (SUTURE) ×1 IMPLANT
SUT VICRYL 4-0 PS2 18IN ABS (SUTURE) ×4 IMPLANT
SYR 20CC LL (SYRINGE) ×4 IMPLANT
SYR 30ML LL (SYRINGE) IMPLANT
SYR 5ML LL (SYRINGE) IMPLANT
SYR MEDRAD MARK V 150ML (SYRINGE) ×2 IMPLANT
SYRINGE 10CC LL (SYRINGE) ×4 IMPLANT
TOWEL OR 17X24 6PK STRL BLUE (TOWEL DISPOSABLE) ×4 IMPLANT
TOWEL OR 17X26 10 PK STRL BLUE (TOWEL DISPOSABLE) ×4 IMPLANT
TRAY FOLEY CATH 16FRSI W/METER (SET/KITS/TRAYS/PACK) ×2 IMPLANT
TUBING HIGH PRESSURE 120CM (CONNECTOR) ×2 IMPLANT
WIRE AMPLATZ SS-J .035X180CM (WIRE) ×4 IMPLANT
WIRE BENTSON .035X145CM (WIRE) ×4 IMPLANT

## 2013-08-23 NOTE — Interval H&P Note (Signed)
History and Physical Interval Note:  08/23/2013 7:34 AM  Carl Rios  has presented today for surgery, with the diagnosis of Abdominal Aortic Aneurysm  The various methods of treatment have been discussed with the patient and family. After consideration of risks, benefits and other options for treatment, the patient has consented to  Procedure(s): ABDOMINAL AORTIC ENDOVASCULAR STENT GRAFT (N/A) as a surgical intervention .  The patient's history has been reviewed, patient examined, no change in status, stable for surgery.  I have reviewed the patient's chart and labs.  Questions were answered to the patient's satisfaction.     Josephina Gip

## 2013-08-23 NOTE — Progress Notes (Signed)
Physician notified: Lelon Mast PA At: 81  Regarding: Pt. C/o right eye "sratchy", unable to open on own. Reddened. Awaiting return response.   Returned Response at: 1445  Order(s): clear eyes drops. PRN to right eye.

## 2013-08-23 NOTE — Progress Notes (Signed)
Physician notified: Rene Kocher PA  At: 1432  Regarding: Pt. C/o right eye "scratchy". Eye reddened, pt. Unable to open by self.  Awaiting return response.  No response at this time. Called office, Lelon Mast, awaiting call back.

## 2013-08-23 NOTE — Anesthesia Preprocedure Evaluation (Signed)
Anesthesia Evaluation  Patient identified by MRN, date of birth, ID band Patient awake    Reviewed: Allergy & Precautions, H&P , NPO status , Patient's Chart, lab work & pertinent test results  Airway Mallampati: II      Dental   Pulmonary neg pulmonary ROS,          Cardiovascular hypertension, + Peripheral Vascular Disease     Neuro/Psych    GI/Hepatic Neg liver ROS, GERD-  ,  Endo/Other    Renal/GU negative Renal ROS     Musculoskeletal   Abdominal   Peds  Hematology   Anesthesia Other Findings   Reproductive/Obstetrics                           Anesthesia Physical Anesthesia Plan  ASA: III  Anesthesia Plan: General   Post-op Pain Management:    Induction: Intravenous  Airway Management Planned: Oral ETT  Additional Equipment:   Intra-op Plan:   Post-operative Plan:   Informed Consent: I have reviewed the patients History and Physical, chart, labs and discussed the procedure including the risks, benefits and alternatives for the proposed anesthesia with the patient or authorized representative who has indicated his/her understanding and acceptance.     Plan Discussed with:   Anesthesia Plan Comments:         Anesthesia Quick Evaluation

## 2013-08-23 NOTE — Evaluation (Signed)
Informed by vascular PA that patient's eye is irritated. Upon my arrival the patient stated that that his left eye was feeling better. Talked to nurse Lequita Halt and the patient to reconsult if needed. Judie Petit MD

## 2013-08-23 NOTE — Progress Notes (Signed)
Utilization review completed.  

## 2013-08-23 NOTE — H&P (View-Only) (Signed)
Subjective:     Patient ID: Carl Rios, male   DOB: [birth year], 79 y.o.   MRN: [id number]  HPI this 79 year old male is seen today in continued followup regarding his abdominal aortic aneurysm which I have been following. It has been in the 4.7 cm range most recently in April of 2014. Today he had a CT angiogram performed in the aneurysm now measures 5.3 cm in maximum diameter.  Past Medical History  Diagnosis Date  . AAA (abdominal aortic aneurysm) without rupture   . Hyperlipidemia   . Diverticulosis   . Peripheral neuropathy   . GERD (gastroesophageal reflux disease)   . Hx of adenomatous colonic polyps   . Peripheral vascular disease   . Obesity   . Dyslipidemia   . History of colon polyps   . History of prostatitis   . Hearing difficulty     History  Substance Use Topics  . Smoking status: Former Smoker -- 5 years    Quit date: 01/18/1959  . Smokeless tobacco: Never Used  . Alcohol Use: 1.5 oz/week    3 drink(s) per week     Comment: 3 drinks per week    Family History  Problem Relation Age of Onset  . Heart disease Father 76    Heart diease before age 69    Allergies  Allergen Reactions  . Sulfamethoxazole Other (See Comments)    UNKNOWN    Current outpatient prescriptions:acetaminophen (TYLENOL) 500 MG tablet, Take 1,000 mg by mouth every 6 (six) hours as needed. pain, Disp: , Rfl: ;  Calcium Carbonate-Vitamin D (CALCIUM + D PO), Take 1 tablet by mouth daily., Disp: , Rfl: ;  DULoxetine (CYMBALTA) 60 MG capsule, Take 1 capsule (60 mg total) by mouth at bedtime., Disp: 30 capsule, Rfl: 5  BP 158/90  Pulse 99  Resp 16  Ht 5\' 8"  (1.727 m)  Wt 207 lb (93.895 kg)  BMI 31.48 kg/m2  Body mass index is 31.48 kg/(m^2).          Review of Systems very poor hearing. Denies any chest pain, dyspnea on exertion, PND, orthopnea, claudication. All other systems negative and complete review of systems     Objective:   Physical Exam BP 158/90  Pulse 99  Resp  16  Ht 5\' 8"  (1.727 m)  Wt 207 lb (93.895 kg)  BMI 31.48 kg/m2  Gen.-alert and oriented x3 in no apparent distress HEENT normal for age Lungs no rhonchi or wheezing Cardiovascular regular rhythm no murmurs carotid pulses 3+ palpable no bruits audible Abdomen soft nontender no palpable masses-obese Musculoskeletal free of  major deformities Skin clear -no rashes Neurologic normal Lower extremities 3+ femoral and dorsalis pedis pulses palpable bilaterally with no edema  Today I reviewed the CT angiogram by computer. Patient does have a 5.3 cm infrarenal abdominal aortic aneurysm which appears to be a good candidate for aortic stent grafting       Assessment:     Abdominal aortic aneurysm-5.3 cm in maximum diameter    Plan:     #1 plan Cardiolite this week #2 plan aortobifem and iliac stent graft insertion on Thursday, October 23-risks and benefits thoroughly discussed and patient would like to proceed

## 2013-08-23 NOTE — Op Note (Signed)
OPERATIVE REPORT  Date of Surgery: 08/23/2013  Surgeon: Josephina Gip, MD  Assistant: Lianne Cure PA  Pre-op Diagnosis: Abdominal Aortic Aneurysm  Post-op Diagnosis: Abdominal Aortic Aneurysm  Procedure: Procedure(s): #1 bilateral common femoral artery accessed via percutaneous on the right and cut down on left #2 insertion aorta no bi-common iliac Gore-Excluder-C3 stent graft via bilateral common femoral approach      A 31 x 14 x 15 cm main body via left     B 12 mm x 12 cm contralateral limb on the right Repair right femoral artery with Pro-glide device x2 Open repair left common femoral artery Completion angiography Anesthesia: General  EBL: 200 cc  Complications: None  Procedure Details:  The patient was taken to the operating room placed in supine position at which time satisfactory general endotracheal anesthesia was administered. The abdomen and groins were prepped with Betadine scrub and solution. The right common femoral was entered using a percutaneous approach using the SonoSite ultrasound as guidance. Pro-glide suture closure device was placed at the 10:00 and the 2:00 position an 8 [place] sheath inserted. Attempt was made to do the same procedure on the left side but the Pro-glide device malfunctioned and this required a cutdown with direct exposure of the common femoral artery and encircled with vessel loops proximally and distally. A sheath had been inserted after unsuccessful attempt with the Pro-glide device. Short 8 French sheath was inserted on the left side the patient was heparinized. Following this a Bentson wire was exchanged for an Amplatz superstiff wire and 18 French sheath was inserted on the left to the area of the renal arteries. Lowest-left renal artery was at the top of L2. A 31 x 14 x 15 cm device was selected and was inserted from the left side with the limbs crossed. He was positioned in the aorta just distal to the renal arteries and angiogram was  performed using 10 cc of contrast at 10 cc per second to precisely marked the renal arteries. The graft was then deployed without difficulty a second angiogram confirmed good location of the graft just distal to the renal arteries. The graft was then deployed down to the contralateral gate. Using the long 8 French sheath and a Kumpe catheter the contralateral gate was cannulated with a Glidewire. This was then exchanged for a superstiff Amplatz wire. Position was confirmed using the pigtail catheter. Following this a retrograde angiogram was performed through the sheath on the right for measurement purposes using 10 cc of contrast and a hand injection. A 12 millimeter by 12 cm graft was selected and deployed appropriately into the right common iliac artery terminating just proximal to the hypogastric. Patient was returned to the left and retrograde study was performed through the sheath to confirm the measurement on the left and in fact the graft was appropriate length terminating about 1-2 cm proximal the left hypogastric. It was then completely deployed. All junctions were dilated with the Q.-50 balloon appear completion angiogram was then performed injecting 20 cc contrast at 20 cc per second. There is no evidence of type I endoleak. There was some degree of a late type II leak through lumbar branches. Following this the sheaths were removed on the right and the Pro-glide search sutures deployed with good hemostasis. Attention return to the left side and under direct vision the sheath and guidewire were removed artery clamped appropriately proximally and distally. There was good antegrade and back bleeding. Artery was repaired with 6-0 Prolene suture clamps released  there was excellent pulse and excellent Doppler flow in the left superficial femoral and profunda femoris artery. Protamine was given to reverse the heparin following adequate hemostasis left groin wound closed in layers of Vicryl subcuticular fashion  and the small puncture wound on the right was closed with 4-0 Vicryl. Sterile dressing applied patient to recovery in stable condition   Josephina Gip, MD 08/23/2013 10:28 AM

## 2013-08-23 NOTE — Anesthesia Postprocedure Evaluation (Signed)
  Anesthesia Post-op Note  Patient: Carl Rios  Procedure(s) Performed: Procedure(s): ABDOMINAL AORTIC ENDOVASCULAR STENT GRAFT (N/A)  Patient Location: PACU  Anesthesia Type:General  Level of Consciousness: awake  Airway and Oxygen Therapy: Patient Spontanous Breathing  Post-op Pain: mild  Post-op Assessment: Post-op Vital signs reviewed  Post-op Vital Signs: Reviewed  Complications: No apparent anesthesia complications

## 2013-08-23 NOTE — Anesthesia Procedure Notes (Signed)
Procedure Name: Intubation Date/Time: 08/23/2013 7:55 AM Performed by: Gwenyth Allegra Pre-anesthesia Checklist: Emergency Drugs available, Timeout performed, Patient identified, Suction available and Patient being monitored Patient Re-evaluated:Patient Re-evaluated prior to inductionOxygen Delivery Method: Circle system utilized Preoxygenation: Pre-oxygenation with 100% oxygen Intubation Type: IV induction Ventilation: Mask ventilation without difficulty Laryngoscope Size: Mac and 4 Grade View: Grade I Tube type: Oral Tube size: 8.0 mm Airway Equipment and Method: Stylet Secured at: 22 cm Tube secured with: Tape Dental Injury: Teeth and Oropharynx as per pre-operative assessment

## 2013-08-23 NOTE — Progress Notes (Signed)
Physician notified: Doreatha Massed At: 62  Regarding: Pt. Right eye very painful, continues to water, red. Drops are painful for patient. Maybe consider ointment or pain relieving drops?  Awaiting return response.   Returned Response at: 1625  Order(s): Will call Dr. Imogene Burn and call you back with plan.

## 2013-08-23 NOTE — Progress Notes (Signed)
Pt. Arrived to unit from PACU. Placed on monitor. VSS. Pedal pulses doppled. Bilateral groin sites CDI, soft. No bruit no hematoma noted. Will continue to monitor. Pt. Sleeping comfortably on 2 liters oxygen per nasal cannula.

## 2013-08-23 NOTE — Transfer of Care (Signed)
Immediate Anesthesia Transfer of Care Note  Patient: Carl Rios  Procedure(s) Performed: Procedure(s): ABDOMINAL AORTIC ENDOVASCULAR STENT GRAFT (N/A)  Patient Location: PACU  Anesthesia Type:General  Level of Consciousness: awake and alert   Airway & Oxygen Therapy: Patient Spontanous Breathing and Patient connected to nasal cannula oxygen  Post-op Assessment: Report given to PACU RN and Post -op Vital signs reviewed and stable  Post vital signs: Reviewed and stable  Complications: No apparent anesthesia complications

## 2013-08-23 NOTE — Preoperative (Signed)
Beta Blockers   Reason not to administer Beta Blockers:Not Applicable 

## 2013-08-24 ENCOUNTER — Telehealth: Payer: Self-pay | Admitting: Vascular Surgery

## 2013-08-24 ENCOUNTER — Other Ambulatory Visit: Payer: Self-pay | Admitting: *Deleted

## 2013-08-24 DIAGNOSIS — I714 Abdominal aortic aneurysm, without rupture: Secondary | ICD-10-CM

## 2013-08-24 DIAGNOSIS — Z48812 Encounter for surgical aftercare following surgery on the circulatory system: Secondary | ICD-10-CM

## 2013-08-24 LAB — CBC
HCT: 33.8 % — ABNORMAL LOW (ref 39.0–52.0)
MCV: 89.4 fL (ref 78.0–100.0)
RBC: 3.78 MIL/uL — ABNORMAL LOW (ref 4.22–5.81)
WBC: 8.2 10*3/uL (ref 4.0–10.5)

## 2013-08-24 LAB — BASIC METABOLIC PANEL
BUN: 12 mg/dL (ref 6–23)
CO2: 23 mEq/L (ref 19–32)
Chloride: 104 mEq/L (ref 96–112)
Creatinine, Ser: 0.76 mg/dL (ref 0.50–1.35)
Glucose, Bld: 125 mg/dL — ABNORMAL HIGH (ref 70–99)
Potassium: 3.7 mEq/L (ref 3.5–5.1)

## 2013-08-24 LAB — TYPE AND SCREEN
ABO/RH(D): B POS
Antibody Screen: NEGATIVE
Unit division: 0

## 2013-08-24 MED ORDER — OXYCODONE HCL 5 MG PO TABS: 5.0000 mg | ORAL_TABLET | Freq: Four times a day (QID) | ORAL | Status: DC | PRN

## 2013-08-24 MED ORDER — ATORVASTATIN CALCIUM 10 MG PO TABS: 5.0000 mg | ORAL_TABLET | Freq: Every day | ORAL | Status: DC

## 2013-08-24 NOTE — Telephone Encounter (Addendum)
Message copied by Fredrich Birks on Fri Aug 24, 2013  2:09 PM ------      Message from: [place], [place] K      Created: Fri Aug 24, 2013  8:19 AM      Regarding: schedule                   ----- Message -----         From: Dara Lords, PA-C         Sent: 08/24/2013   7:35 AM           To: Sharee Pimple, CMA            S/p EVAR 08/23/13.  F/u with Dr. Hart Rochester in 4 weeks with CTA protocol for EVAR.            Thanks,      Samantha ------  08/24/13: lm for patient, dpm

## 2013-08-24 NOTE — Discharge Summary (Signed)
[facility] Discharge Summary  Carl Rios 03-20-1926 79 y.o. male  [id number]  Admission Date: 08/23/2013  Discharge Date: 08/24/13  Physician: Pryor Ochoa, MD  Admission Diagnosis: Abdominal Aortic Aneurysm   HPI:   This is a 79 y.o. male is seen today in continued followup regarding his abdominal aortic aneurysm which I have been following. It has been in the 4.7 cm range most recently in April of 2014. Today he had a CT angiogram performed in the aneurysm now measures 5.3 cm in maximum diameter.  Hospital Course:  The patient was admitted to the hospital and taken to the operating room on 08/23/2013 and underwent: #1 bilateral common femoral artery accessed via percutaneous on the right and cut down on left  #2 insertion aorta no bi-common iliac Gore-Excluder-C3 stent graft via bilateral common femoral approach  A 31 x 14 x 15 cm main body via left  B 12 mm x 12 cm contralateral limb on the right  Repair right femoral artery with Pro-glide device x2  Open repair left common femoral artery  Completion angiography    The pt tolerated the procedure well and was transported to the PACU in good condition. On the the day of surgery, pt only complaint was painful right eye with no relief with saline gtts.  Anesthesia called to see pt and eye had improved that evening.  Pt did have acute surgical blood loss anemia and is asymptomatic from this.  The remainder of the hospital course consisted of increasing mobilization and increasing intake of solids without difficulty.  CBC    Component Value Date/Time   WBC 8.2 08/24/2013 0535   RBC 3.78* 08/24/2013 0535   HGB 11.4* 08/24/2013 0535   HCT 33.8* 08/24/2013 0535   PLT 214 08/24/2013 0535   MCV 89.4 08/24/2013 0535   MCH 30.2 08/24/2013 0535   MCHC 33.7 08/24/2013 0535   RDW 14.8 08/24/2013 0535   LYMPHSABS 1.6 01/18/2012 1403   MONOABS 0.5 01/18/2012 1403   EOSABS 0.1 01/18/2012 1403   BASOSABS  0.0 01/18/2012 1403    BMET    Component Value Date/Time   NA 138 08/24/2013 0535   K 3.7 08/24/2013 0535   CL 104 08/24/2013 0535   CO2 23 08/24/2013 0535   GLUCOSE 125* 08/24/2013 0535   BUN 12 08/24/2013 0535   CREATININE 0.76 08/24/2013 0535   CREATININE 0.95 08/10/2013 1246   CALCIUM 8.3* 08/24/2013 0535   GFRNONAA 81* 08/24/2013 0535   GFRAA >90 08/24/2013 0535     Discharge Instructions:   The patient is discharged to home with extensive instructions on wound care and progressive ambulation.  They are instructed not to drive or perform any heavy lifting until returning to see the physician in his office.  Discharge Orders   Future Appointments Provider Department Dept Phone   12/13/2013 11:00 AM Nilda Riggs, NP [facility] [phone]   Future Orders Complete By Expires   ABDOMINAL PROCEDURE/ANEURYSM REPAIR/AORTO-BIFEMORAL BYPASS:  Call MD for increased abdominal pain; cramping diarrhea; nausea/vomiting  As directed    Call MD for:  redness, tenderness, or signs of infection (pain, swelling, bleeding, redness, odor or green/yellow discharge around incision site)  As directed    Call MD for:  severe or increased pain, loss or decreased feeling  in affected limb(s)  As directed    Call MD for:  temperature >100.5  As directed    Discharge wound care:  As directed  Comments:     Shower daily and wash the groin wound with soap and water daily and pat dry. (No tub bath-only shower)  Then put a dry gauze or washcloth there to keep this area dry daily and as needed.  Do not use Vaseline or neosporin on your incisions.  Only use soap and water on your incisions and then protect and keep dry.   Driving Restrictions  As directed    Comments:     No driving for 2 weeks   Lifting restrictions  As directed    Comments:     No lifting for 4 weeks   Resume previous diet  As directed       Discharge Diagnosis:  Abdominal Aortic Aneurysm  Secondary  Diagnosis: Patient Active Problem List   Diagnosis Date Noted  . AAA (abdominal aortic aneurysm) without rupture 08/14/2013  . Abnormality of gait 01/08/2013  . Other general symptoms(780.99) 01/08/2013  . Other vitamin B12 deficiency anemia 01/08/2013  . Polyneuropathy in other diseases classified elsewhere 01/08/2013  . Abdominal aneurysm without mention of rupture 02/01/2012  . Hypokalemia 01/25/2012  . Anemia due to acute blood loss 01/24/2012  . Benign neoplasm of colon 01/24/2012  . Hypertension 01/19/2012  . Other and unspecified hyperlipidemia 01/18/2012  . Impaired fasting glucose 01/18/2012  . Unspecified hereditary and idiopathic peripheral neuropathy 01/18/2012  . Hypertonicity of bladder 01/18/2012  . Diverticulosis 01/18/2012  . Rectal bleeding 01/18/2012  . GERD 02/09/2010  . COLONIC POLYPS, ADENOMATOUS, HX OF 02/09/2010   Past Medical History  Diagnosis Date  . AAA (abdominal aortic aneurysm) without rupture   . Hyperlipidemia   . Diverticulosis   . GERD (gastroesophageal reflux disease)   . Hx of adenomatous colonic polyps   . Peripheral vascular disease   . Obesity   . Dyslipidemia   . History of colon polyps   . History of prostatitis   . Hearing difficulty   . Peripheral neuropathy      Medication List         acetaminophen 500 MG tablet  Commonly known as:  TYLENOL  Take 1,000 mg by mouth every 6 (six) hours as needed for pain.     atorvastatin 10 MG tablet  Commonly known as:  LIPITOR  Take 0.5 tablets (5 mg total) by mouth daily.     CALCIUM + D PO  Take 1 tablet by mouth daily.     DULoxetine 30 MG capsule  Commonly known as:  CYMBALTA  Take 30 mg by mouth daily.     OVER THE COUNTER MEDICATION  Take 1 tablet by mouth 2 (two) times daily. OTC supplement "Macular Protect Plus"     oxyCODONE 5 MG immediate release tablet  Commonly known as:  ROXICODONE  Take 1 tablet (5 mg total) by mouth every 6 (six) hours as needed for pain.          Roxicodone #30 No Refill Rx for Lipitor 10 mg 1/2 tab daily given to pt at discharge.  Disposition: home  Patient's condition: is Good  Follow up: 1. Dr. Hart Rochester in 4 weeks with CTA   Doreatha Massed, PA-C [facility] [phone] 08/24/2013  7:36 AM   - For VQI Registry use --- Instructions: Press F2 to tab through selections.  Delete question if not applicable.   Post-op:  Time to Extubation: [x ] In OR, [ ]  < 12 hrs, [ ]  12-24 hrs, [ ]  >=24 hrs Vasopressors Req. Post-op: No MI:  no, [ ]  Troponin only, [ ]  EKG or Clinical New Arrhythmia: No CHF: No ICU Stay: 1 day in stepdown unit Transfusion: No  If yes, n/a units given  Complications: Resp failure: no, [ ]  Pneumonia, [ ]  Ventilator Chg in renal function: no, [ ]  Inc. Cr > 0.5, [ ]  Temp. Dialysis, [ ]  Permanent dialysis Leg ischemia: no, no Surgery needed, [ ]  Yes, Surgery needed, [ ]  Amputation Bowel ischemia: no, [ ]  Medical Rx, [ ]  Surgical Rx Wound complication: no, [ ]  Superficial separation/infection, [ ]  Return to OR Return to OR: No  Return to OR for bleeding: No Stroke: no, [ ]  Minor, [ ]  Major  Discharge medications: Statin use:  Yes If No: [ ]  For Medical reasons, [ ]  Non-compliant, [ ]  Not-indicated ASA use:  No  If No: [ ]  For Medical reasons, [ ]  Non-compliant, [ ]  Not-indicated Plavix use:  No If No: [ ]  For Medical reasons, [ ]  Non-compliant, [ ]  Not-indicated Beta blocker use:  No If No: [ ]  For Medical reasons, [ ]  Non-compliant, [ ]  Not-indicated

## 2013-08-24 NOTE — Progress Notes (Signed)
VASCULAR PROGRESS NOTE  SUBJECTIVE: No complaints.  PHYSICAL EXAM: Filed Vitals:   08/23/13 1611 08/23/13 1935 08/24/13 0004 08/24/13 0412  BP: 137/72 151/96 134/58 151/82  Pulse: 83 84 104 93  Temp:  98.9 F (37.2 C) 99.3 F (37.4 C) 98.8 F (37.1 C)  TempSrc:  Oral Oral Oral  Resp: 15 24 19 21   Height:      Weight:      SpO2: 98% 98% 94% 95%   Left groin incision looks fine. Palpable right DP pulse.  Both feet warm and well perfused.   LABS: Lab Results  Component Value Date   WBC 8.2 08/24/2013   HGB 11.4* 08/24/2013   HCT 33.8* 08/24/2013   MCV 89.4 08/24/2013   PLT 214 08/24/2013   Lab Results  Component Value Date   CREATININE 0.76 08/24/2013   Lab Results  Component Value Date   INR 1.11 08/23/2013   ASSESSMENT AND PLAN: 1. 1 Day Post-Op s/p: [facility] 2. Doing well. 3. Home today. F/U in 1 month with CT scan.  Cari Caraway Beeper: [phone] 08/24/2013

## 2013-08-27 ENCOUNTER — Encounter (HOSPITAL_COMMUNITY): Payer: Self-pay | Admitting: Vascular Surgery

## 2013-09-04 ENCOUNTER — Telehealth: Payer: Self-pay | Admitting: *Deleted

## 2013-09-04 NOTE — Telephone Encounter (Signed)
Talked to Dr. Alver Fisher nurse, Marchelle Folks, regarding Mr. Phelp's preop CXR that showed a 1.71mm left basiliar pulmonary nodule. I faxed the report to Dr. Clelia Croft [phone]) because they are not on EPIC; Marchelle Folks will bring this to Dr. Alver Fisher attention to make sure this followup doesn't get missed.

## 2013-09-24 ENCOUNTER — Encounter: Payer: Self-pay | Admitting: Vascular Surgery

## 2013-09-25 ENCOUNTER — Ambulatory Visit
Admission: RE | Admit: 2013-09-25 | Discharge: 2013-09-25 | Disposition: A | Discharge status: Home / Self Care | Payer: Medicare Other | Source: Ambulatory Visit | Attending: Vascular Surgery | Admitting: Vascular Surgery

## 2013-09-25 ENCOUNTER — Ambulatory Visit (INDEPENDENT_AMBULATORY_CARE_PROVIDER_SITE_OTHER): Payer: Self-pay | Admitting: Vascular Surgery

## 2013-09-25 ENCOUNTER — Encounter: Payer: Self-pay | Admitting: Vascular Surgery

## 2013-09-25 VITALS — BP 149/72 | HR 89 | Ht 68.0 in | Wt 207.9 lb

## 2013-09-25 DIAGNOSIS — I714 Abdominal aortic aneurysm, without rupture, unspecified: Secondary | ICD-10-CM

## 2013-09-25 DIAGNOSIS — Z48812 Encounter for surgical aftercare following surgery on the circulatory system: Secondary | ICD-10-CM

## 2013-09-25 MED ORDER — IOHEXOL 350 MG/ML SOLN
100.0000 mL | Freq: Once | INTRAVENOUS | Status: AC | PRN
  Administered 2013-09-25: 100 mL via INTRAVENOUS

## 2013-09-25 NOTE — Progress Notes (Signed)
Subjective:     Patient ID: Carl Rios, male   DOB: [birth year], 79 y.o.   MRN: [id number]  HPI this 79 year old male returns 4 weeks post endovascular stent graft insertion for abdominal aortic aneurysm using a Gore-C3-excluder graft. This required a cutdown in the left femoral area because of failure of the provided suture device. Right side was done percutaneously. He has no specific complaints today. He states he is ambulating full as normal. Denies nausea and vomiting.  Past Medical History  Diagnosis Date  . AAA (abdominal aortic aneurysm) without rupture   . Hyperlipidemia   . Diverticulosis   . GERD (gastroesophageal reflux disease)   . Hx of adenomatous colonic polyps   . Peripheral vascular disease   . Obesity   . Dyslipidemia   . History of colon polyps   . History of prostatitis   . Hearing difficulty   . Peripheral neuropathy     History  Substance Use Topics  . Smoking status: Former Smoker -- 5 years    Quit date: 01/18/1959  . Smokeless tobacco: Never Used  . Alcohol Use: 1.5 oz/week    3 drink(s) per week     Comment: 3 drinks per week   (QUIT X 6 MONTHS)    Family History  Problem Relation Age of Onset  . Heart disease Father 5    Heart diease before age 64    Allergies  Allergen Reactions  . Sulfamethoxazole Other (See Comments)    UNKNOWN    Current outpatient prescriptions:acetaminophen (TYLENOL) 500 MG tablet, Take 1,000 mg by mouth every 6 (six) hours as needed for pain. , Disp: , Rfl: ;  atorvastatin (LIPITOR) 10 MG tablet, Take 0.5 tablets (5 mg total) by mouth daily., Disp: 30 tablet, Rfl: 3;  Calcium Carbonate-Vitamin D (CALCIUM + D PO), Take 1 tablet by mouth daily., Disp: , Rfl: ;  DULoxetine (CYMBALTA) 30 MG capsule, Take 30 mg by mouth daily., Disp: , Rfl:  OVER THE COUNTER MEDICATION, Take 1 tablet by mouth 2 (two) times daily. OTC supplement "Macular Protect Plus", Disp: , Rfl: ;  oxyCODONE (ROXICODONE) 5 MG immediate release tablet, Take  1 tablet (5 mg total) by mouth every 6 (six) hours as needed for pain., Disp: 30 tablet, Rfl: 0  BP 149/72  Pulse 89  Ht 5\' 8"  (1.727 m)  Wt 207 lb 14.4 oz (94.303 kg)  BMI 31.62 kg/m2  SpO2 96%  Body mass index is 31.62 kg/(m^2).           Review of Systems denies chest pain, hemoptysis, dyspnea on exertion, claudication to    Objective:   Physical Exam BP 149/72  Pulse 89  Ht 5\' 8"  (1.727 m)  Wt 207 lb 14.4 oz (94.303 kg)  BMI 31.62 kg/m2  SpO2 96%  Gen. elderly male no apparent stress alert and oriented x3 Abdomen soft no pulsatile mass palpable Inguinal area on the left has well-healed oblique incision with 3+ femoral pulse palpable right percutaneous site is well healed as well with 3+ femoral pulse palpable  Today I ordered a CT angiogram of the abdomen and pelvis which I reviewed the computer. Aneurysm is stable at 5.3 cm in diameter with no evidence of endoleak and good position of the graft. There is a seroma anterior to the left femoral artery where the cutdown was required      Assessment:     One-month   post endovascular stent graft repair of aortic aneurysm-doing well Small seroma  anterior to left femoral artery where cutdown was performed Plan:      return in 6 months with repeat CT angiogram of abdomen and pelvis

## 2013-09-25 NOTE — Addendum Note (Signed)
Addended by: Dannielle Karvonen on: 09/25/2013 04:29 PM   Modules accepted: Orders

## 2013-11-21 DIAGNOSIS — H43819 Vitreous degeneration, unspecified eye: Secondary | ICD-10-CM | POA: Diagnosis not present

## 2013-11-21 DIAGNOSIS — H353190 Nonexudative age-related macular degeneration, unspecified eye, stage unspecified: Secondary | ICD-10-CM | POA: Diagnosis not present

## 2013-11-28 ENCOUNTER — Encounter: Payer: Self-pay | Admitting: Nurse Practitioner

## 2013-12-11 ENCOUNTER — Encounter: Payer: Self-pay | Admitting: Nurse Practitioner

## 2013-12-11 ENCOUNTER — Ambulatory Visit (INDEPENDENT_AMBULATORY_CARE_PROVIDER_SITE_OTHER): Payer: Medicare Other | Admitting: Nurse Practitioner

## 2013-12-11 VITALS — BP 162/71 | HR 46 | Ht 68.0 in | Wt 209.0 lb

## 2013-12-11 DIAGNOSIS — R269 Unspecified abnormalities of gait and mobility: Secondary | ICD-10-CM | POA: Diagnosis not present

## 2013-12-11 DIAGNOSIS — G63 Polyneuropathy in diseases classified elsewhere: Secondary | ICD-10-CM

## 2013-12-11 MED ORDER — DULOXETINE HCL 30 MG PO CPEP: 30.0000 mg | ORAL_CAPSULE | Freq: Every day | ORAL | Status: DC

## 2013-12-11 NOTE — Progress Notes (Signed)
I have read the note, and I agree with the clinical assessment and plan.  Khalis Hittle KEITH   

## 2013-12-11 NOTE — Progress Notes (Signed)
[facility]  PATIENT: Carl Rios DOB: 05/20/1926   REASON FOR VISIT: Followup for peripheral neuropathy  HISTORY OF PRESENT ILLNESS:Mr. Carl Rios, 80 -year-old male returns for followup. He was last seen in the office by Dr. Jannifer Franklin 06/12/2013. At that time he was getting some physical therapy for balance training which has been very beneficial. He does not do his exercises on a daily basis. He denies any recent falls, he does not use an assistive device. Nerve conduction studies were performed on both lower extremities, and these studies were unremarkable. There is no clear evidence of a peripheral neuropathy on nerve conduction study. EMG evaluation of both lower extremities shows mild chronic stable distal neuropathic denervation. The left lumbosacral paraspinal muscles showed mild acute denervation. Overall, the EMG study is suggestive of a chronic stable bilateral L5 and S1 radiculopathy. He continues to complain of numbness in the distal portions of his feet particularly in the toes. He has no difficulty controlling his bowel or bladder. He denies any back pain. He has failed Lyrica in the past. He has been unable to tolerate higher doses of Cymbalta due to dizziness. He returns for reevaluation  HISTORY: small fiber neuropathy. The patient is on Cymbalta, taking 60 mg at night. The patient indicates that this medication does cause some dizziness, but if he takes it at night, he can minimize the side effects. The patient indicates that this does help his pain, and he is able to rest at night. The patient has undergone physical therapy for gait training, and he believes that this has been helpful. The patient denies any falls since last seen. The patient returns for an evaluation.   REVIEW OF SYSTEMS: Full 14 system review of systems performed and notable only for those listed, all others are neg:  Constitutional: N/A  Cardiovascular: N/A  Ear/Nose/Throat: Hearing loss    Skin: N/A  Eyes: N/A  Respiratory: N/A  Gastroitestinal: N/A  Hematology/Lymphatic: N/A  Endocrine: Increased thirst Musculoskeletal:N/A  Allergy/Immunology: N/A  Neurological: Numbness of the toes Psychiatric: N/A   ALLERGIES: Allergies  Allergen Reactions  . Sulfamethoxazole Other (See Comments)    UNKNOWN    HOME MEDICATIONS: Outpatient Prescriptions Prior to Visit  Medication Sig Dispense Refill  . acetaminophen (TYLENOL) 500 MG tablet Take 1,000 mg by mouth every 6 (six) hours as needed for pain.       . Calcium Carbonate-Vitamin D (CALCIUM + D PO) Take 1 tablet by mouth daily.      . DULoxetine (CYMBALTA) 30 MG capsule Take 30 mg by mouth daily.      Marland Kitchen OVER THE COUNTER MEDICATION Take 1 tablet by mouth 2 (two) times daily. OTC supplement "Macular Protect Plus"      . atorvastatin (LIPITOR) 10 MG tablet Take 0.5 tablets (5 mg total) by mouth daily.  30 tablet  3  . oxyCODONE (ROXICODONE) 5 MG immediate release tablet Take 1 tablet (5 mg total) by mouth every 6 (six) hours as needed for pain.  30 tablet  0   No facility-administered medications prior to visit.    PAST MEDICAL HISTORY: Past Medical History  Diagnosis Date  . AAA (abdominal aortic aneurysm) without rupture   . Hyperlipidemia   . Diverticulosis   . GERD (gastroesophageal reflux disease)   . Hx of adenomatous colonic polyps   . Peripheral vascular disease   . Obesity   . Dyslipidemia   . History of colon polyps   . History of prostatitis   .  Hearing difficulty   . Peripheral neuropathy     PAST SURGICAL HISTORY: Past Surgical History  Procedure Laterality Date  . Knee surgery      left knee in 1970's  . Colonoscopy  01/24/2012    Procedure: COLONOSCOPY;  Surgeon: Lafayette Dragon, MD;  Location: [facility];  Service: Endoscopy;  Laterality: N/A;  . Inguinal hernia repair    . Cataract extraction Bilateral   . Tonsillectomy    . Abdominal aortic endovascular stent graft N/A 08/23/2013     Procedure: ABDOMINAL AORTIC ENDOVASCULAR STENT GRAFT;  Surgeon: Mal Misty, MD;  Location: [facility];  Service: Vascular;  Laterality: N/A;  . Abdominal aortic aneurysm repair      FAMILY HISTORY: Family History  Problem Relation Age of Onset  . Heart disease Father 97    Heart diease before age 56    SOCIAL HISTORY: History   Social History  . Marital Status: Married    Spouse Name: N/A    Number of Children: 3  . Years of Education: 16   Occupational History  . Retired    Social History Main Topics  . Smoking status: Former Smoker -- 5 years    Quit date: 01/18/1959  . Smokeless tobacco: Never Used  . Alcohol Use: No     Comment: 3 drinks per week   (QUIT X 6 MONTHS)  . Drug Use: No  . Sexual Activity: Not on file   Other Topics Concern  . Not on file   Social History Narrative  . No narrative on file     PHYSICAL EXAM  Filed Vitals:   12/11/13 0924  BP: 162/71  Pulse: 46  Height: 5\' 8"  (1.727 m)  Weight: 209 lb (94.802 kg)   Body mass index is 31.79 kg/(m^2).  Generalized: Well developed, moderately obese male in no acute distress  Musculoskeletal: No deformity  Skin  no significant peripheral edema is seen  Neurological examination   Mentation: Alert oriented to time, place, history taking. Follows all commands speech and language fluent  Cranial nerve II-XII: Pupils were equal round reactive to light extraocular movements were full, visual field were full on confrontational test. Facial sensation and strength were normal. hearing was intact to finger rubbing bilaterally. Uvula tongue midline. head turning and shoulder shrug were normal and symmetric.Tongue protrusion into cheek strength was normal. Motor: normal bulk and tone, full strength in the BUE, BLE,  No focal weakness Sensory:  Sensory testing is intact to pinprick, soft touch, vibratory sensation and position sense in all 4 extremities, with the exception that there is a stocking pattern  decrease of pinprick in the distal foot bilaterally.   Coordination: finger-nose-finger, heel-to-shin bilaterally, some apraxia with the use of his extremities Reflexes: Brachioradialis 2/2, biceps 2/2, triceps 2/2, patellar 2/2, Achilles trace, plantar responses were flexor bilaterally. Gait and Station: Rising up from seated position without assistance, wide based stance,   smooth turning, able to perform tiptoe, and heel walking without difficulty. Tandem gait is unsteady. Romberg is negative  DIAGNOSTIC DATA (LABS, IMAGING, TESTING) - I reviewed patient records, labs, notes, testing and imaging myself where available.  Lab Results  Component Value Date   WBC 8.2 08/24/2013   HGB 11.4* 08/24/2013   HCT 33.8* 08/24/2013   MCV 89.4 08/24/2013   PLT 214 08/24/2013      Component Value Date/Time   NA 138 08/24/2013 0535   K 3.7 08/24/2013 0535   CL 104 08/24/2013 0535  CO2 23 08/24/2013 0535   GLUCOSE 125* 08/24/2013 0535   BUN 12 08/24/2013 0535   CREATININE 0.76 08/24/2013 0535   CREATININE 0.95 08/10/2013 1246   CALCIUM 8.3* 08/24/2013 0535   PROT 6.7 08/21/2013 1349   ALBUMIN 3.8 08/21/2013 1349   AST 21 08/21/2013 1349   ALT 17 08/21/2013 1349   ALKPHOS 80 08/21/2013 1349   BILITOT 0.4 08/21/2013 1349   GFRNONAA 81* 08/24/2013 0535   GFRAA >90 08/24/2013 0535    ASSESSMENT AND PLAN  80 y.o. year old male  has a past medical history of  Peripheral neuropathy, small fiber and gait disturbance here for followup. He is currently taking Cymbalta 30 mg daily with good benefit. He intermittently does his balance exercises. These have been very helpful for him he denies any recent falls.   Continue home exercise program every day Continue Cymbalta 30 mg daily call for refills Followup in 6 months Dennie Bible, [facility], [facility], APRN  [facility] [address] [place],  [address] [phone]

## 2013-12-11 NOTE — Patient Instructions (Signed)
Continue home exercise program every day Continue Cymbalta 30 mg daily call for refills Followup in 6 months

## 2013-12-13 ENCOUNTER — Ambulatory Visit: Payer: Medicare Other | Admitting: Nurse Practitioner

## 2014-02-05 DIAGNOSIS — R7301 Impaired fasting glucose: Secondary | ICD-10-CM | POA: Diagnosis not present

## 2014-02-05 DIAGNOSIS — Z125 Encounter for screening for malignant neoplasm of prostate: Secondary | ICD-10-CM | POA: Diagnosis not present

## 2014-02-05 DIAGNOSIS — R030 Elevated blood-pressure reading, without diagnosis of hypertension: Secondary | ICD-10-CM | POA: Diagnosis not present

## 2014-02-05 DIAGNOSIS — E785 Hyperlipidemia, unspecified: Secondary | ICD-10-CM | POA: Diagnosis not present

## 2014-02-18 DIAGNOSIS — H353190 Nonexudative age-related macular degeneration, unspecified eye, stage unspecified: Secondary | ICD-10-CM | POA: Diagnosis not present

## 2014-02-18 DIAGNOSIS — H40019 Open angle with borderline findings, low risk, unspecified eye: Secondary | ICD-10-CM | POA: Diagnosis not present

## 2014-02-18 DIAGNOSIS — H524 Presbyopia: Secondary | ICD-10-CM | POA: Diagnosis not present

## 2014-02-18 DIAGNOSIS — Z961 Presence of intraocular lens: Secondary | ICD-10-CM | POA: Diagnosis not present

## 2014-02-18 DIAGNOSIS — H353290 Exudative age-related macular degeneration, unspecified eye, stage unspecified: Secondary | ICD-10-CM | POA: Diagnosis not present

## 2014-02-19 ENCOUNTER — Other Ambulatory Visit: Payer: Self-pay | Admitting: Neurology

## 2014-02-20 DIAGNOSIS — Z23 Encounter for immunization: Secondary | ICD-10-CM | POA: Diagnosis not present

## 2014-02-20 DIAGNOSIS — R7301 Impaired fasting glucose: Secondary | ICD-10-CM | POA: Diagnosis not present

## 2014-02-20 DIAGNOSIS — N138 Other obstructive and reflux uropathy: Secondary | ICD-10-CM | POA: Diagnosis not present

## 2014-02-20 DIAGNOSIS — E785 Hyperlipidemia, unspecified: Secondary | ICD-10-CM | POA: Diagnosis not present

## 2014-02-20 DIAGNOSIS — Z0000 Encounter for general adult medical examination without abnormal findings: Secondary | ICD-10-CM | POA: Diagnosis not present

## 2014-02-20 DIAGNOSIS — K219 Gastro-esophageal reflux disease without esophagitis: Secondary | ICD-10-CM | POA: Diagnosis not present

## 2014-02-20 DIAGNOSIS — R030 Elevated blood-pressure reading, without diagnosis of hypertension: Secondary | ICD-10-CM | POA: Diagnosis not present

## 2014-02-20 DIAGNOSIS — G609 Hereditary and idiopathic neuropathy, unspecified: Secondary | ICD-10-CM | POA: Diagnosis not present

## 2014-02-20 DIAGNOSIS — R269 Unspecified abnormalities of gait and mobility: Secondary | ICD-10-CM | POA: Diagnosis not present

## 2014-02-20 DIAGNOSIS — N401 Enlarged prostate with lower urinary tract symptoms: Secondary | ICD-10-CM | POA: Diagnosis not present

## 2014-03-01 DIAGNOSIS — I639 Cerebral infarction, unspecified: Secondary | ICD-10-CM

## 2014-03-01 DIAGNOSIS — Z1212 Encounter for screening for malignant neoplasm of rectum: Secondary | ICD-10-CM | POA: Diagnosis not present

## 2014-03-01 DIAGNOSIS — M6282 Rhabdomyolysis: Secondary | ICD-10-CM

## 2014-03-01 HISTORY — DX: Rhabdomyolysis: M62.82

## 2014-03-01 HISTORY — DX: Cerebral infarction, unspecified: I63.9

## 2014-03-13 ENCOUNTER — Other Ambulatory Visit: Payer: Self-pay | Admitting: Vascular Surgery

## 2014-03-13 DIAGNOSIS — I714 Abdominal aortic aneurysm, without rupture, unspecified: Secondary | ICD-10-CM | POA: Diagnosis not present

## 2014-03-13 DIAGNOSIS — Z48812 Encounter for surgical aftercare following surgery on the circulatory system: Secondary | ICD-10-CM | POA: Diagnosis not present

## 2014-03-13 LAB — CREATININE, SERUM: CREATININE: 0.94 mg/dL (ref 0.50–1.35)

## 2014-03-13 LAB — BUN: BUN: 15 mg/dL (ref 6–23)

## 2014-03-22 ENCOUNTER — Encounter: Payer: Self-pay | Admitting: Vascular Surgery

## 2014-03-26 ENCOUNTER — Encounter (HOSPITAL_COMMUNITY): Payer: Self-pay | Admitting: Emergency Medicine

## 2014-03-26 ENCOUNTER — Ambulatory Visit: Payer: Medicare Other | Admitting: Vascular Surgery

## 2014-03-26 ENCOUNTER — Emergency Department (HOSPITAL_COMMUNITY): Payer: Medicare Other

## 2014-03-26 ENCOUNTER — Telehealth: Payer: Self-pay | Admitting: Vascular Surgery

## 2014-03-26 ENCOUNTER — Inpatient Hospital Stay: Admission: RE | Admit: 2014-03-26 | Payer: Medicare Other | Source: Ambulatory Visit

## 2014-03-26 ENCOUNTER — Inpatient Hospital Stay (HOSPITAL_COMMUNITY)
Admission: EM | Admit: 2014-03-26 | Discharge: 2014-04-01 | DRG: 682 | Disposition: A | Discharge status: Skilled Nursing Facility | Payer: Medicare Other | Attending: Internal Medicine | Admitting: Internal Medicine

## 2014-03-26 DIAGNOSIS — I471 Supraventricular tachycardia, unspecified: Secondary | ICD-10-CM | POA: Diagnosis not present

## 2014-03-26 DIAGNOSIS — M549 Dorsalgia, unspecified: Secondary | ICD-10-CM | POA: Diagnosis not present

## 2014-03-26 DIAGNOSIS — R1310 Dysphagia, unspecified: Secondary | ICD-10-CM | POA: Diagnosis present

## 2014-03-26 DIAGNOSIS — G609 Hereditary and idiopathic neuropathy, unspecified: Secondary | ICD-10-CM | POA: Diagnosis not present

## 2014-03-26 DIAGNOSIS — K219 Gastro-esophageal reflux disease without esophagitis: Secondary | ICD-10-CM | POA: Diagnosis not present

## 2014-03-26 DIAGNOSIS — R296 Repeated falls: Secondary | ICD-10-CM

## 2014-03-26 DIAGNOSIS — Z79899 Other long term (current) drug therapy: Secondary | ICD-10-CM | POA: Diagnosis not present

## 2014-03-26 DIAGNOSIS — N179 Acute kidney failure, unspecified: Principal | ICD-10-CM | POA: Diagnosis present

## 2014-03-26 DIAGNOSIS — I059 Rheumatic mitral valve disease, unspecified: Secondary | ICD-10-CM | POA: Diagnosis not present

## 2014-03-26 DIAGNOSIS — M6282 Rhabdomyolysis: Secondary | ICD-10-CM | POA: Diagnosis not present

## 2014-03-26 DIAGNOSIS — R471 Dysarthria and anarthria: Secondary | ICD-10-CM | POA: Diagnosis present

## 2014-03-26 DIAGNOSIS — I714 Abdominal aortic aneurysm, without rupture, unspecified: Secondary | ICD-10-CM | POA: Diagnosis present

## 2014-03-26 DIAGNOSIS — R531 Weakness: Secondary | ICD-10-CM

## 2014-03-26 DIAGNOSIS — I498 Other specified cardiac arrhythmias: Secondary | ICD-10-CM | POA: Diagnosis not present

## 2014-03-26 DIAGNOSIS — Z9181 History of falling: Secondary | ICD-10-CM | POA: Diagnosis not present

## 2014-03-26 DIAGNOSIS — R269 Unspecified abnormalities of gait and mobility: Secondary | ICD-10-CM

## 2014-03-26 DIAGNOSIS — I6350 Cerebral infarction due to unspecified occlusion or stenosis of unspecified cerebral artery: Secondary | ICD-10-CM | POA: Diagnosis present

## 2014-03-26 DIAGNOSIS — H9190 Unspecified hearing loss, unspecified ear: Secondary | ICD-10-CM | POA: Diagnosis present

## 2014-03-26 DIAGNOSIS — I639 Cerebral infarction, unspecified: Secondary | ICD-10-CM | POA: Diagnosis present

## 2014-03-26 DIAGNOSIS — E860 Dehydration: Secondary | ICD-10-CM | POA: Diagnosis not present

## 2014-03-26 DIAGNOSIS — E669 Obesity, unspecified: Secondary | ICD-10-CM | POA: Diagnosis present

## 2014-03-26 DIAGNOSIS — R5383 Other fatigue: Secondary | ICD-10-CM | POA: Diagnosis not present

## 2014-03-26 DIAGNOSIS — G608 Other hereditary and idiopathic neuropathies: Secondary | ICD-10-CM | POA: Diagnosis not present

## 2014-03-26 DIAGNOSIS — R404 Transient alteration of awareness: Secondary | ICD-10-CM | POA: Diagnosis not present

## 2014-03-26 DIAGNOSIS — I6330 Cerebral infarction due to thrombosis of unspecified cerebral artery: Secondary | ICD-10-CM | POA: Diagnosis not present

## 2014-03-26 DIAGNOSIS — I739 Peripheral vascular disease, unspecified: Secondary | ICD-10-CM | POA: Diagnosis present

## 2014-03-26 DIAGNOSIS — E785 Hyperlipidemia, unspecified: Secondary | ICD-10-CM | POA: Diagnosis present

## 2014-03-26 DIAGNOSIS — Z5189 Encounter for other specified aftercare: Secondary | ICD-10-CM | POA: Diagnosis not present

## 2014-03-26 DIAGNOSIS — N390 Urinary tract infection, site not specified: Secondary | ICD-10-CM | POA: Diagnosis not present

## 2014-03-26 DIAGNOSIS — Z8673 Personal history of transient ischemic attack (TIA), and cerebral infarction without residual deficits: Secondary | ICD-10-CM

## 2014-03-26 DIAGNOSIS — I6990 Unspecified sequelae of unspecified cerebrovascular disease: Secondary | ICD-10-CM | POA: Diagnosis not present

## 2014-03-26 DIAGNOSIS — R42 Dizziness and giddiness: Secondary | ICD-10-CM | POA: Diagnosis not present

## 2014-03-26 DIAGNOSIS — IMO0002 Reserved for concepts with insufficient information to code with codable children: Secondary | ICD-10-CM | POA: Diagnosis not present

## 2014-03-26 DIAGNOSIS — Z87891 Personal history of nicotine dependence: Secondary | ICD-10-CM | POA: Diagnosis not present

## 2014-03-26 DIAGNOSIS — W19XXXA Unspecified fall, initial encounter: Secondary | ICD-10-CM | POA: Diagnosis present

## 2014-03-26 DIAGNOSIS — R5381 Other malaise: Secondary | ICD-10-CM | POA: Diagnosis not present

## 2014-03-26 DIAGNOSIS — S0990XA Unspecified injury of head, initial encounter: Secondary | ICD-10-CM | POA: Diagnosis not present

## 2014-03-26 DIAGNOSIS — R2681 Unsteadiness on feet: Secondary | ICD-10-CM | POA: Diagnosis present

## 2014-03-26 DIAGNOSIS — I10 Essential (primary) hypertension: Secondary | ICD-10-CM | POA: Diagnosis not present

## 2014-03-26 LAB — BASIC METABOLIC PANEL
BUN: 27 mg/dL — AB (ref 6–23)
CHLORIDE: 103 meq/L (ref 96–112)
CO2: 23 mEq/L (ref 19–32)
CREATININE: 0.98 mg/dL (ref 0.50–1.35)
Calcium: 10.2 mg/dL (ref 8.4–10.5)
GFR calc Af Amer: 84 mL/min — ABNORMAL LOW (ref >90)
GFR calc non Af Amer: 72 mL/min — ABNORMAL LOW (ref >90)
Glucose, Bld: 142 mg/dL — ABNORMAL HIGH (ref 70–99)
Potassium: 4.8 mEq/L (ref 3.7–5.3)
Sodium: 144 mEq/L (ref 137–147)

## 2014-03-26 LAB — CBG MONITORING, ED: Glucose-Capillary: 133 mg/dL — ABNORMAL HIGH (ref 70–99)

## 2014-03-26 LAB — CBC
HEMATOCRIT: 49.6 % (ref 39.0–52.0)
Hemoglobin: 16.8 g/dL (ref 13.0–17.0)
MCH: 30.4 pg (ref 26.0–34.0)
MCHC: 33.9 g/dL (ref 30.0–36.0)
MCV: 89.7 fL (ref 78.0–100.0)
Platelets: 293 10*3/uL (ref 150–400)
RBC: 5.53 MIL/uL (ref 4.22–5.81)
RDW: 14.8 % (ref 11.5–15.5)
WBC: 14.1 10*3/uL — AB (ref 4.0–10.5)

## 2014-03-26 LAB — CK: Total CK: 1550 U/L — ABNORMAL HIGH (ref 7–232)

## 2014-03-26 LAB — TROPONIN I

## 2014-03-26 MED ORDER — SODIUM CHLORIDE 0.9 % IV BOLUS (SEPSIS)
250.0000 mL | Freq: Once | INTRAVENOUS | Status: AC
  Administered 2014-03-26: 250 mL via INTRAVENOUS

## 2014-03-26 NOTE — ED Notes (Signed)
Dr. Pollina at bedside   

## 2014-03-26 NOTE — ED Notes (Signed)
Pt taken to CT.

## 2014-03-26 NOTE — Telephone Encounter (Addendum)
Message copied by Gena Fray on Tue Mar 26, 2014  2:54 PM ------      Message from: Merleen Nicely      Created: Tue Mar 26, 2014  1:54 PM      Regarding: CANCELLATION       Cancel his appointment . He will call back to reschedule.            Thanks      Ebony Hail ------  03/26/14: canceled, recall entered, dpm

## 2014-03-26 NOTE — ED Notes (Signed)
Pt arrives via EMS from home. EMS reports that pt fell early this morning and EMS was called out. Vital signs were checked and WNL and pt refused transport. EMS reports that pt fell again this afternoon and pt agreed to come to hospital. Pt c/o dizzyness and weakness. 12 lead unremarkable, CBG 182. AAOx3. Pt states hes been under a lot of stress with recently moving. Pt denies neck and back pain. EMS reports that pt fell in the bathroom onto tile. Marland Kitchen

## 2014-03-26 NOTE — ED Notes (Addendum)
pts son states that he came in this morning and found his dad on the floor. Believes that he was on the floor from 7p last night to 10am this morning.

## 2014-03-26 NOTE — ED Provider Notes (Signed)
CSN: [id number]     Arrival date & time 03/26/14  2059 History   First MD Initiated Contact with Patient 03/26/14 2122     Chief Complaint  Patient presents with  . Fall  . Weakness     (Consider location/radiation/quality/duration/timing/severity/associated sxs/prior Treatment) HPI Comments: Patient presents to the ER by EMS. Patient reportedly fell in his bathroom last night and laid on the floor all night. Son when I checked him today around noon and found him on the floor. Son had difficulty getting up off of the ground, as is extremely weak and unstable on his feet. EMS were called to the house but patient declined transport initially. Son stayed with him this evening and he seemed to be getting weaker. Son had difficulty ambulating him in the house at all, patient had another near fall. EMS was called back to the house to transport to the emergency room.  Patient denies loss of consciousness when he fell last night. He said he felt weak and lost his balance, did not pass out. No chest pain, palpitations, shortness of breath. He is having some back pain, but only when he moves around. He denies extremity pain, hip pain.  Patient is a 80 y.o. male presenting with fall and weakness.  Fall  Weakness    Past Medical History  Diagnosis Date  . AAA (abdominal aortic aneurysm) without rupture   . Hyperlipidemia   . Diverticulosis   . GERD (gastroesophageal reflux disease)   . Hx of adenomatous colonic polyps   . Peripheral vascular disease   . Obesity   . Dyslipidemia   . History of colon polyps   . History of prostatitis   . Hearing difficulty   . Peripheral neuropathy    Past Surgical History  Procedure Laterality Date  . Knee surgery      left knee in 1970's  . Colonoscopy  01/24/2012    Procedure: COLONOSCOPY;  Surgeon: Lafayette Dragon, MD;  Location: [facility];  Service: Endoscopy;  Laterality: N/A;  . Inguinal hernia repair    . Cataract extraction Bilateral   .  Tonsillectomy    . Abdominal aortic endovascular stent graft N/A 08/23/2013    Procedure: ABDOMINAL AORTIC ENDOVASCULAR STENT GRAFT;  Surgeon: Mal Misty, MD;  Location: [facility] OR;  Service: Vascular;  Laterality: N/A;  . Abdominal aortic aneurysm repair     Family History  Problem Relation Age of Onset  . Heart disease Father 80    Heart diease before age 34   History  Substance Use Topics  . Smoking status: Former Smoker -- 5 years    Quit date: 01/18/1959  . Smokeless tobacco: Never Used  . Alcohol Use: No     Comment: 3 drinks per week   (QUIT X 6 MONTHS)    Review of Systems  Musculoskeletal: Positive for back pain.  Neurological: Positive for weakness.  All other systems reviewed and are negative.     Allergies  Sulfamethoxazole  Home Medications   Prior to Admission medications   Medication Sig Start Date End Date Taking? Authorizing Provider  acetaminophen (TYLENOL) 500 MG tablet Take 1,000 mg by mouth every 6 (six) hours as needed for pain.     Historical Provider, MD  Calcium Carbonate-Vitamin D (CALCIUM + D PO) Take 1 tablet by mouth daily.    Historical Provider, MD  DULoxetine (CYMBALTA) 30 MG capsule Take 1 capsule (30 mg total) by mouth daily. 12/11/13   Dennie Bible, NP  DULoxetine (CYMBALTA) 30 MG capsule Take 1 capsule (30 mg total) by mouth daily. 02/19/14   Kathrynn Ducking, MD  OVER THE COUNTER MEDICATION Take 1 tablet by mouth 2 (two) times daily. OTC supplement "Macular Protect Plus"    Historical Provider, MD   BP 146/96  Pulse 114  Temp(Src) 97.7 F (36.5 C) (Oral)  Resp 19  Ht 5\' 8"  (1.727 m)  Wt 209 lb (94.802 kg)  BMI 31.79 kg/m2  SpO2 97% Physical Exam  Constitutional: He is oriented to person, place, and time. He appears well-developed and well-nourished. No distress.  HENT:  Head: Normocephalic and atraumatic.  Right Ear: Hearing normal.  Left Ear: Hearing normal.  Nose: Nose normal.  Mouth/Throat: Oropharynx is clear and  moist and mucous membranes are normal.  Eyes: Conjunctivae and EOM are normal. Pupils are equal, round, and reactive to light.  Neck: Normal range of motion. Neck supple.  Cardiovascular: Regular rhythm, S1 normal and S2 normal.  Exam reveals no gallop and no friction rub.   No murmur heard. Pulmonary/Chest: Effort normal and breath sounds normal. No respiratory distress. He exhibits no tenderness.  Abdominal: Soft. Normal appearance and bowel sounds are normal. There is no hepatosplenomegaly. There is no tenderness. There is no rebound, no guarding, no tenderness at McBurney's point and negative Murphy's sign. No hernia.  Musculoskeletal: Normal range of motion.  Neurological: He is alert and oriented to person, place, and time. He has normal strength. No cranial nerve deficit or sensory deficit. Coordination normal. GCS eye subscore is 4. GCS verbal subscore is 5. GCS motor subscore is 6.  Skin: Skin is warm, dry and intact. No rash noted. No cyanosis.  Psychiatric: He has a normal mood and affect. His speech is normal and behavior is normal. Thought content normal.    ED Course  Procedures (including critical care time) Labs Review Labs Reviewed  CBC - Abnormal; Notable for the following:    WBC 14.1 (*)    All other components within normal limits  BASIC METABOLIC PANEL - Abnormal; Notable for the following:    Glucose, Bld 142 (*)    BUN 27 (*)    GFR calc non Af Amer 72 (*)    GFR calc Af Amer 84 (*)    All other components within normal limits  CK - Abnormal; Notable for the following:    Total CK 1550 (*)    All other components within normal limits  CBG MONITORING, ED - Abnormal; Notable for the following:    Glucose-Capillary 133 (*)    All other components within normal limits  TROPONIN I  URINALYSIS, ROUTINE W REFLEX MICROSCOPIC    Imaging Review No results found.   EKG Interpretation   Date/Time:  Tuesday Mar 26 2014 21:08:54 EDT Ventricular Rate:  116 PR  Interval:  157 QRS Duration: 87 QT Interval:  311 QTC Calculation: 432 R Axis:   -55 Text Interpretation:  Sinus tachycardia Atrial premature complexes  Probable left atrial enlargement Abnormal R-wave progression, late  transition Inferior infarct, old tachy, otherwise no change Confirmed by  POLLINA  MD, CHRISTOPHER [phone]) on 03/26/2014 10:33:14 PM      MDM   Final diagnoses:  Weakness  Falls  Rhabdomyolysis    Patient presents to the ER for evaluation after the fall. Patient became weak and dizzy in his bathroom, fell and could not get up. He laid on the floor all night long until her son came over this afternoon. Since then  he has been having difficulty getting up and ambulating because of his generalized weakness. He had mild sinus tachycardia on arrival, likely secondary to dehydration. Has not eaten or drank all day. No evidence of renal failure. He does have elevated CPK consistent with mild rhabdomyolysis. This is from lying on the floor and he will require observation for IV hydration and monitoring.   Orpah Greek, MD 03/26/14 [phone]

## 2014-03-26 NOTE — H&P (Signed)
Physician Admission History and Physical     PCP:   Marton Redwood, MD   Chief Complaint:  Fall and down for > 12 hours   HPI: Carl Rios is an 80 y.o. male. Pt w/ known hx of peripheral neuropathy and pt of Dr Brigitte Pulse presented after falling from "hitting a wall" early this AM and was down for over 12 hours b/f family was able to get him up. After being down, he was unable to support himself and required great assistance to get to ED. In ED, he was found to be tachycardic, w/ elevated BUN/Cr ration and CK > 1500. His MM were dry and he showed signs of dehydration. He does not have AKI at this time . He will be admitted for rehydration in setting of elevated CK and consultation to SW and PT/OT to reeval his independant living situation.   Review of Systems:  He is currently in pain "all over"  ROS neg except as noted above Past Medical History: Past Medical History  Diagnosis Date  . AAA (abdominal aortic aneurysm) without rupture   . Hyperlipidemia   . Diverticulosis   . GERD (gastroesophageal reflux disease)   . Hx of adenomatous colonic polyps   . Peripheral vascular disease   . Obesity   . Dyslipidemia   . History of colon polyps   . History of prostatitis   . Hearing difficulty   . Peripheral neuropathy    Past Surgical History  Procedure Laterality Date  . Knee surgery      left knee in 1970's  . Colonoscopy  01/24/2012    Procedure: COLONOSCOPY;  Surgeon: Lafayette Dragon, MD;  Location: [facility];  Service: Endoscopy;  Laterality: N/A;  . Inguinal hernia repair    . Cataract extraction Bilateral   . Tonsillectomy    . Abdominal aortic endovascular stent graft N/A 08/23/2013    Procedure: ABDOMINAL AORTIC ENDOVASCULAR STENT GRAFT;  Surgeon: Mal Misty, MD;  Location: [facility];  Service: Vascular;  Laterality: N/A;  . Abdominal aortic aneurysm repair      Medications: Prior to Admission medications   Medication Sig Start Date End Date Taking? Authorizing Provider   DULoxetine (CYMBALTA) 30 MG capsule Take 1 capsule (30 mg total) by mouth daily. 02/19/14  Yes Kathrynn Ducking, MD  OVER THE COUNTER MEDICATION Take 1 tablet by mouth 2 (two) times daily. OTC supplement "Macular Protect Plus"    Historical Provider, MD    Allergies:   Allergies  Allergen Reactions  . Sulfamethoxazole Other (See Comments)    UNKNOWN    Social History:  reports that he quit smoking about 55 years ago. He has never used smokeless tobacco. He reports that he does not drink alcohol or use illicit drugs.  Family History: Family History  Problem Relation Age of Onset  . Heart disease Father 47    Heart diease before age 16    Physical Exam: Filed Vitals:   03/26/14 2109 03/26/14 2115  BP: 143/88 146/96  Pulse: 115 114  Temp: 97.7 F (36.5 C)   TempSrc: Oral   Resp: 23 19  Height: 5\' 8"  (1.727 m)   Weight: 94.802 kg (209 lb)   SpO2: 96% 97%   Gen: WM in NAD  HEENT:dry MM, anicteric, trachea midline  Lungs:CTAB, no wheezes/rales  Cardio: RRR, no MRG  Abd: soft, NT, ND  MSK: no focal deficits but weak LE  Neuro: no focal deficits  Skin: warm, dry  Labs on Admission:   Recent Labs  03/26/14 2125  NA 144  K 4.8  CL 103  CO2 23  GLUCOSE 142*  BUN 27*  CREATININE 0.98  CALCIUM 10.2   No results found for this basename: AST, ALT, ALKPHOS, BILITOT, PROT, ALBUMIN,  in the last 72 hours No results found for this basename: LIPASE, AMYLASE,  in the last 72 hours  Recent Labs  03/26/14 2125  WBC 14.1*  HGB 16.8  HCT 49.6  MCV 89.7  PLT 293    Recent Labs  03/26/14 2125  [place] <0.30   Lab Results  Component Value Date   INR 1.11 08/23/2013   INR 0.98 08/21/2013   INR 1.07 01/23/2012   No results found for this basename: TSH, T4TOTAL, FREET3, T3FREE, THYROIDAB,  in the last 72 hours No results found for this basename: VITAMINB12, FOLATE, FERRITIN, TIBC, IRON, RETICCTPCT,  in the last 72 hours  Radiological Exams  on Admission: No results found. Orders placed during the hospital encounter of 03/26/14  . ED EKG  . ED EKG  . EKG 12-LEAD  . EKG 12-LEAD  . ED EKG  . ED EKG    Assessment/Plan Active Problems:   Rhabdomyolysis  - hydrate w/ NS at 125cc/hr . Check CK in AM . Check GFR in AM   Falls   - cont on home meds for periph neuropathy . Consult SW/PT/OT for dispo recs   - imaging of head, spine, pelvis currently pending   PPx   - lovenox   Dispo - pending Pt/OT recs     Daivion Pape 03/26/2014, 11:24 PM

## 2014-03-26 NOTE — ED Notes (Signed)
Pt back from CT

## 2014-03-27 ENCOUNTER — Encounter (HOSPITAL_COMMUNITY): Payer: Self-pay | Admitting: General Practice

## 2014-03-27 DIAGNOSIS — E860 Dehydration: Secondary | ICD-10-CM | POA: Diagnosis present

## 2014-03-27 DIAGNOSIS — N179 Acute kidney failure, unspecified: Secondary | ICD-10-CM | POA: Diagnosis present

## 2014-03-27 LAB — CBC
HEMATOCRIT: 44.6 % (ref 39.0–52.0)
HEMOGLOBIN: 14.8 g/dL (ref 13.0–17.0)
MCH: 30.1 pg (ref 26.0–34.0)
MCHC: 33.2 g/dL (ref 30.0–36.0)
MCV: 90.8 fL (ref 78.0–100.0)
PLATELETS: 258 10*3/uL (ref 150–400)
RBC: 4.91 MIL/uL (ref 4.22–5.81)
RDW: 15.1 % (ref 11.5–15.5)
WBC: 11.2 10*3/uL — AB (ref 4.0–10.5)

## 2014-03-27 LAB — COMPREHENSIVE METABOLIC PANEL
ALK PHOS: 84 U/L (ref 39–117)
ALT: 22 U/L (ref 0–53)
AST: 38 U/L — AB (ref 0–37)
Albumin: 3.6 g/dL (ref 3.5–5.2)
BILIRUBIN TOTAL: 1.2 mg/dL (ref 0.3–1.2)
BUN: 28 mg/dL — ABNORMAL HIGH (ref 6–23)
CHLORIDE: 103 meq/L (ref 96–112)
CO2: 23 meq/L (ref 19–32)
Calcium: 9.4 mg/dL (ref 8.4–10.5)
Creatinine, Ser: 1 mg/dL (ref 0.50–1.35)
GFR calc Af Amer: 76 mL/min — ABNORMAL LOW (ref >90)
GFR, EST NON AFRICAN AMERICAN: 66 mL/min — AB (ref >90)
Glucose, Bld: 129 mg/dL — ABNORMAL HIGH (ref 70–99)
POTASSIUM: 3.9 meq/L (ref 3.7–5.3)
SODIUM: 142 meq/L (ref 137–147)
Total Protein: 6.7 g/dL (ref 6.0–8.3)

## 2014-03-27 LAB — URINE MICROSCOPIC-ADD ON

## 2014-03-27 LAB — URINALYSIS, ROUTINE W REFLEX MICROSCOPIC
BILIRUBIN URINE: NEGATIVE
Glucose, UA: NEGATIVE mg/dL
Ketones, ur: 15 mg/dL — AB
Leukocytes, UA: NEGATIVE
Nitrite: NEGATIVE
PROTEIN: 30 mg/dL — AB
Specific Gravity, Urine: 1.034 — ABNORMAL HIGH (ref 1.005–1.030)
UROBILINOGEN UA: 0.2 mg/dL (ref 0.0–1.0)
pH: 5 (ref 5.0–8.0)

## 2014-03-27 LAB — CK: Total CK: 1026 U/L — ABNORMAL HIGH (ref 7–232)

## 2014-03-27 MED ORDER — ONDANSETRON HCL 4 MG PO TABS: 4.0000 mg | ORAL_TABLET | Freq: Four times a day (QID) | ORAL | Status: DC | PRN

## 2014-03-27 MED ORDER — HYDROCODONE-ACETAMINOPHEN 5-325 MG PO TABS
1.0000 | ORAL_TABLET | ORAL | Status: DC | PRN
Start: 2014-03-27 — End: 2014-03-31
  Administered 2014-03-27: 2 via ORAL
  Administered 2014-03-27: 1 via ORAL
  Administered 2014-03-28 (×2): 2 via ORAL
  Filled 2014-03-27 (×3): qty 2
  Filled 2014-03-27: qty 1

## 2014-03-27 MED ORDER — ONDANSETRON HCL 4 MG/2ML IJ SOLN: 4.0000 mg | Freq: Four times a day (QID) | INTRAMUSCULAR | Status: DC | PRN

## 2014-03-27 MED ORDER — POLYETHYLENE GLYCOL 3350 17 G PO PACK
17.0000 g | PACK | Freq: Every day | ORAL | Status: DC | PRN
  Administered 2014-03-30: 17 g via ORAL
  Filled 2014-03-27: qty 1

## 2014-03-27 MED ORDER — ENOXAPARIN SODIUM 40 MG/0.4ML ~~LOC~~ SOLN
40.0000 mg | SUBCUTANEOUS | Status: DC
  Filled 2014-03-27: qty 0.4

## 2014-03-27 MED ORDER — ENOXAPARIN SODIUM 40 MG/0.4ML ~~LOC~~ SOLN
40.0000 mg | SUBCUTANEOUS | Status: DC
  Administered 2014-03-28 – 2014-04-01 (×5): 40 mg via SUBCUTANEOUS
  Filled 2014-03-27 (×5): qty 0.4

## 2014-03-27 MED ORDER — ALUM & MAG HYDROXIDE-SIMETH 200-200-20 MG/5ML PO SUSP
30.0000 mL | Freq: Four times a day (QID) | ORAL | Status: DC | PRN
  Administered 2014-03-30: 30 mL via ORAL
  Filled 2014-03-27: qty 30

## 2014-03-27 MED ORDER — DULOXETINE HCL 30 MG PO CPEP
30.0000 mg | ORAL_CAPSULE | Freq: Every day | ORAL | Status: DC
  Administered 2014-03-27 – 2014-04-01 (×6): 30 mg via ORAL
  Filled 2014-03-27 (×6): qty 1

## 2014-03-27 MED ORDER — ENOXAPARIN SODIUM 40 MG/0.4ML ~~LOC~~ SOLN
40.0000 mg | SUBCUTANEOUS | Status: DC
  Administered 2014-03-27: 40 mg via SUBCUTANEOUS
  Filled 2014-03-27 (×2): qty 0.4

## 2014-03-27 MED ORDER — MORPHINE SULFATE 2 MG/ML IJ SOLN: 2.0000 mg | INTRAMUSCULAR | Status: DC | PRN

## 2014-03-27 MED ORDER — SODIUM CHLORIDE 0.9 % IV SOLN
INTRAVENOUS | Status: DC
  Administered 2014-03-27 – 2014-03-29 (×7): via INTRAVENOUS

## 2014-03-27 NOTE — Progress Notes (Signed)
Subjective: Admitted overnight for fall with prolonged period on the ground.  Complains of being sore all over and diffuse weakness.  Has been packing boxes for his move.  Occasional falls recently but no injuries.  Reports dizziness in the am when he first gets up.    Objective: Vital signs in last 24 hours: Temp:  [97.7 F (36.5 C)-98.5 F (36.9 C)] 98.5 F (36.9 C) (05/27 0455) Pulse Rate:  [51-115] 60 (05/27 0455) Resp:  [17-24] 20 (05/27 0455) BP: (122-169)/(72-96) 145/72 mmHg (05/27 0455) SpO2:  [95 %-100 %] 98 % (05/27 0455) Weight:  [89.812 kg (198 lb)-94.802 kg (209 lb)] 89.812 kg (198 lb) (05/27 0304) Weight change:  Last BM Date: 03/24/14  CBG (last 3)   Recent Labs  03/26/14 2112  GLUCAP 133*    Intake/Output from previous day: 05/26 0701 - 05/27 0700 In: 240 [P.O.:240] Out: -  Intake/Output this shift:    General appearance: fatigued Eyes: no scleral icterus Throat: oropharynx moist without erythema Resp: clear to auscultation bilaterally Cardio: irregular with frequent ectopy GI: soft, non-tender; bowel sounds normal; no masses,  no organomegaly Extremities: no clubbing, cyanosis or edema Neurologic: diffuse muscle weakness in arms>legs   Lab Results:  Recent Labs  03/26/14 2125 03/27/14 0545  NA 144 142  K 4.8 3.9  CL 103 103  CO2 23 23  GLUCOSE 142* 129*  BUN 27* 28*  CREATININE 0.98 1.00  CALCIUM 10.2 9.4    Recent Labs  03/27/14 0545  AST 38*  ALT 22  ALKPHOS 84  BILITOT 1.2  PROT 6.7  ALBUMIN 3.6    Recent Labs  03/26/14 2125 03/27/14 0545  WBC 14.1* 11.2*  HGB 16.8 14.8  HCT 49.6 44.6  MCV 89.7 90.8  PLT 293 258   Lab Results  Component Value Date   INR 1.11 08/23/2013   INR 0.98 08/21/2013   INR 1.07 01/23/2012    Recent Labs  03/26/14 2125 03/27/14 0545  CKTOTAL 1550* 1026*  TROPONINI <0.30  --    No results found for this basename: TSH, T4TOTAL, FREET3, T3FREE, THYROIDAB,  in the last 72 hours No  results found for this basename: VITAMINB12, FOLATE, FERRITIN, TIBC, IRON, RETICCTPCT,  in the last 72 hours  Studies/Results: Dg Thoracic Spine 2 View  03/26/2014   CLINICAL DATA:  Status post fall; upper back pain.  EXAM: THORACIC SPINE - 2 VIEW  COMPARISON:  Chest radiograph performed 08/21/2013  FINDINGS: There is no evidence of fracture or subluxation. Vertebral bodies demonstrate normal height and alignment. Intervertebral disc spaces are preserved.  The visualized portions of both lungs are clear. The mediastinum is unremarkable in appearance.  IMPRESSION: No evidence of fracture or subluxation along the thoracic spine.   Electronically Signed   By: Garald Balding M.D.   On: 03/26/2014 23:29   Dg Lumbar Spine Complete  03/26/2014   CLINICAL DATA:  Status post fall.  Lower back pain.  EXAM: LUMBAR SPINE - COMPLETE 4+ VIEW  COMPARISON:  CT of the abdomen and pelvis performed 09/25/2013  FINDINGS: There is no evidence of fracture or subluxation. Vertebral bodies demonstrate normal height and alignment. Intervertebral disc spaces are preserved. The visualized neural foramina are grossly unremarkable in appearance.  The visualized bowel gas pattern is unremarkable in appearance; air and stool are noted within the colon. The sacroiliac joints are within normal limits. An aortoiliac stent graft is noted.  IMPRESSION: No evidence of fracture or subluxation along the lumbar spine.  Electronically Signed   By: Garald Balding M.D.   On: 03/26/2014 23:31   Dg Pelvis 1-2 Views  03/26/2014   CLINICAL DATA:  Status post fall; concern for pelvic injury.  EXAM: PELVIS - 1-2 VIEW  COMPARISON:  CTA of the abdomen and pelvis performed 09/25/2013  FINDINGS: There is no evidence of fracture or dislocation. Both femoral heads are seated normally within their respective acetabula. No significant degenerative change is appreciated. The sacroiliac joints are unremarkable in appearance.  The visualized bowel gas pattern is  grossly unremarkable in appearance. The patient's aortoiliac stent graft is partially imaged.  IMPRESSION: No evidence of fracture or dislocation.   Electronically Signed   By: Garald Balding M.D.   On: 03/26/2014 23:33   Ct Head Wo Contrast  03/26/2014   CLINICAL DATA:  Status post fall.  Dizziness and weakness.  EXAM: CT HEAD WITHOUT CONTRAST  TECHNIQUE: Contiguous axial images were obtained from the base of the skull through the vertex without intravenous contrast.  COMPARISON:  None.  FINDINGS: There is no evidence of acute infarction, mass lesion, or intra- or extra-axial hemorrhage on CT.  Prominence of the ventricles and sulci reflects moderate cortical volume loss. Diffuse periventricular and subcortical white matter change likely reflects small vessel ischemic microangiopathy. Cerebellar atrophy is noted. A chronic lacunar infarct is noted at the right thalamus.  The brainstem and fourth ventricle are within normal limits. The cerebral hemispheres demonstrate grossly normal gray-white differentiation. No mass effect or midline shift is seen.  There is no evidence of fracture; visualized osseous structures are unremarkable in appearance. The orbits are within normal limits. The paranasal sinuses and mastoid air cells are well-aerated. No significant soft tissue abnormalities are seen.  IMPRESSION: 1. No evidence of traumatic intracranial injury or fracture. 2. Moderate cortical volume loss and diffuse small vessel ischemic microangiopathy. 3. Chronic lacunar infarct at the right thalamus.   Electronically Signed   By: Garald Balding M.D.   On: 03/26/2014 23:29     Medications: Scheduled: . DULoxetine  30 mg Oral Daily  . [START ON 03/28/2014] enoxaparin (LOVENOX) injection  40 mg Subcutaneous Q24H   Continuous: . sodium chloride 125 mL/hr at 03/27/14 0340    Assessment/Plan: Principal Problem: 1. Rhabdomyolysis- secondary to prolonged period on the ground after fall.  Continue IV fluid  hydration.  Monitor renal function and CK levels.   Active Problems: 2. Acute renal failure- secondary to prerenal azotemia.  Continue foley catheter and monitor with hydration.  3. Dehydration- continue IV fluids 4. Unspecified hereditary and idiopathic peripheral neuropathy- may be contributing to recurrent falls 5. Recurrent falls- PT/OT evaluation.  May need short-term rehab prior to return to independent living.  Check orthostatics once volume repleted.  ?autonomic insufficiency causing dizziness in setting of neuropathy.   LOS: 1 day   Marton Redwood 03/27/2014, 7:23 AM

## 2014-03-27 NOTE — Evaluation (Signed)
Physical Therapy Evaluation Patient Details Name: Carl Rios MRN: [id number] DOB: 1926-09-29 Today's Date: 03/27/2014   History of Present Illness  80 year old pt that lives alone with family nearby.  he just moved from bigger condo to smaller condo this week.  pt fell getting out of his new shower and lay on floor for 12 hours until son came to see him.  Pt was independent piror to fall - driving, doing grocery shopping etc.  Pt is HOH and limited by this.  Son present for eval - son sells medical equipment  Clinical Impression  Pt did well getting out of bed and walking 90 feet with rolling walker.  Pt with increased fear of falling.  Getting to standing was hardest thing for him as sore in hips and back from laying on ground.  Pt feeling better sitting up.  Family will be abel to stay with pt intially and pt would benefit from [facility] services to regain his indepdnence in his new home    Follow Up Recommendations Home health PT;Supervision/Assistance - 24 hour (recommend HHOT as pt with fear of showering now.  son verified that family able to stay with pt 24/7 initally)    Equipment Recommendations   (son to get RW, family looking into life alert button)    Recommendations for Other Services       Precautions / Restrictions Precautions Precautions: Fall (pts last fall was over 2 years ago when dizzy from a new med) Restrictions Weight Bearing Restrictions: No      Mobility  Bed Mobility Overal bed mobility: Needs Assistance Bed Mobility: Supine to Sit     Supine to sit: Mod assist;HOB elevated        Transfers Overall transfer level: Needs assistance Equipment used: Rolling walker (2 wheeled) Transfers: Sit to/from Stand Sit to Stand: Mod assist         General transfer comment: pt needed mod assist to stnad from high bed - this will be his biggest problem with transfers due to sore back  Ambulation/Gait Ambulation/Gait assistance: Min assist Ambulation Distance  (Feet): 80 Feet Assistive device: Rolling walker (2 wheeled) (and chair following pt) Gait Pattern/deviations: Step-through pattern;Shuffle;Trunk flexed     General Gait Details: lots of verbal cues to stay close to walker and to look up while walking.  pt had hard time stearing the RW - wanted to steer to right.  Stairs            Wheelchair Mobility    Modified Rankin (Stroke Patients Only)       Balance Overall balance assessment: Needs assistance Sitting-balance support: Bilateral upper extremity supported (on walker for safety)                                         Pertinent Vitals/Pain Pt with pain in low back and buttocks.  Feeling better in sitting    Home Living Family/patient expects to be discharged to:: Private residence Living Arrangements: Alone Available Help at Discharge: Family (checks on him every few days.  Will be available for 24/7 care intially) Type of Home:  (condo with no steps)         Home Equipment: Cane - single point Additional Comments: son has rolling walker at home and to find it prior to pt gong home    Prior Function Level of Independence: Independent with assistive device(s) (  with cane)               Hand Dominance        Extremity/Trunk Assessment               Lower Extremity Assessment: Overall WFL for tasks assessed (pt reports problems in past with left knee but tested [facility])      Cervical / Trunk Assessment: Normal  Communication   Communication: HOH  Cognition Arousal/Alertness: Awake/alert Behavior During Therapy: WFL for tasks assessed/performed Overall Cognitive Status: Within Functional Limits for tasks assessed                      General Comments      Exercises        Assessment/Plan    PT Assessment Patient needs continued PT services  PT Diagnosis Difficulty walking;Generalized weakness;Acute pain   PT Problem List Decreased activity tolerance;Decreased  balance;Decreased mobility;Decreased knowledge of use of DME;Decreased safety awareness;Pain (increased fear of falling)  PT Treatment Interventions Gait training;DME instruction;Functional mobility training;Therapeutic activities;Therapeutic exercise;Balance training;Patient/family education   PT Goals (Current goals can be found in the Care Plan section) Acute Rehab PT Goals Patient Stated Goal: to get back to new condo and live safely PT Goal Formulation: With patient/family Time For Goal Achievement: 04/06/14 Potential to Achieve Goals: Good    Frequency Min 3X/week   Barriers to discharge        Co-evaluation               End of Session Equipment Utilized During Treatment: Gait belt Activity Tolerance: Patient limited by fatigue Patient left: in chair;with call bell/phone within reach;with family/visitor present Nurse Communication: Mobility status;Patient requests pain meds         Time: 1012-1058 PT Time Calculation (min): 46 min   Charges:   PT Evaluation $Initial PT Evaluation Tier I: 1 Procedure PT Treatments $Gait Training: 23-37 mins   PT G Codes:          Loyal Buba 03/27/2014, 11:25 AM 03/27/2014   Rande Lawman, PT

## 2014-03-27 NOTE — Care Management Note (Addendum)
    Page 1 of 2   04/01/2014     10:45:14 AM CARE MANAGEMENT NOTE 04/01/2014  Patient:  Carl Rios, Carl Rios   Account Number:  [id number]  Date Initiated:  03/27/2014  Documentation initiated by:  Mariann Laster  Subjective/Objective Assessment:   Admitted with Fall and down for > 12 hours     Action/Plan:   CM to follow for disposition needs   Anticipated DC Date:  03/30/2014   Anticipated DC Plan:  [place]  CM consult      [facility] Choice  HOME HEALTH   Choice offered to / List presented to:  C-4 Adult Children        HH arranged  HH-2 PT  HH-3 OT      [place]   Status of service:  In process, will continue to follow Medicare Important Message given?  YES (If response is "NO", the following Medicare IM given date fields will be blank) Date Medicare IM given:  03/26/2014 Date Additional Medicare IM given:  03/29/2014  Discharge Disposition:  IP REHAB FACILITY  Per UR Regulation:  Reviewed for med. necessity/level of care/duration of stay  If discussed at Long Length of Stay Meetings, dates discussed:    Comments:   [place], RN,BSN [phone] Pt has ben approved for CIR. Plan for d/c today. CM did get IM signed, copy given to pt and copy left on shadow chart. No further needs from CM at this time.   03-29-14 [place], RN,BSN [phone] IM given to pt and pt's son signed form, copy given to pt and copy placed in shadow chart. CM did call [facility] services and they will place case on hold for [facility]. CIR can call [facility] as pt gets stable for d/c from CIR. No further needs from CM at this time.    03-28-14 [address], [place] [phone] Pt lives alone in a new Condo. [place]. CM did speak with son Rod and offered choice for [facility] PT/OT services. Refusing Services of RN at this time. CM also provided information in regards to personal care  providers. SOn statred that pt will 24/7 coverage for the weekend, however family will need additional assistance of PCS services. CM provided son with life alert information as well. Pt chose [facility] services and CM will make referral for services. SOC to beginwithin 24-48 hours post d/c. No further needs from CM at this time.    Crystal Hutchinson RN, BSN, MSHL, CCM  Nurse - Case Manager, ([phone]  03/27/2014 Admitted with weaknees and fall.   Rhabdomyolysis:  hydrate w/ NS at 125cc/hr . PT RECS:  Home health PT;Supervision/Assistance - 24 hour (recommend HHOT as pt with fear of showering now.  son verified that family able to stay with pt 24/7 initally) DME RECS:  (son to get RW, family looking into life alert button) Disposition Plan:  Pending

## 2014-03-27 NOTE — ED Notes (Addendum)
pts shoes, pants and shirt sent with pt, no other belongings at the bedside.

## 2014-03-27 NOTE — Progress Notes (Signed)
UR completed Aqsa Sensabaugh K. Analynn Daum, RN, BSN, MSHL, CCM  03/27/2014 1:48 PM

## 2014-03-28 LAB — CBC
HEMATOCRIT: 41.8 % (ref 39.0–52.0)
Hemoglobin: 13.8 g/dL (ref 13.0–17.0)
MCH: 30 pg (ref 26.0–34.0)
MCHC: 33 g/dL (ref 30.0–36.0)
MCV: 90.9 fL (ref 78.0–100.0)
PLATELETS: 183 10*3/uL (ref 150–400)
RBC: 4.6 MIL/uL (ref 4.22–5.81)
RDW: 14.9 % (ref 11.5–15.5)
WBC: 9.8 10*3/uL (ref 4.0–10.5)

## 2014-03-28 LAB — BASIC METABOLIC PANEL
BUN: 21 mg/dL (ref 6–23)
CALCIUM: 8.2 mg/dL — AB (ref 8.4–10.5)
CO2: 20 mEq/L (ref 19–32)
CREATININE: 0.8 mg/dL (ref 0.50–1.35)
Chloride: 105 mEq/L (ref 96–112)
GFR calc Af Amer: >90 mL/min (ref >90)
GFR calc non Af Amer: 79 mL/min — ABNORMAL LOW (ref >90)
Glucose, Bld: 103 mg/dL — ABNORMAL HIGH (ref 70–99)
Potassium: 3.8 mEq/L (ref 3.7–5.3)
Sodium: 139 mEq/L (ref 137–147)

## 2014-03-28 LAB — CK: Total CK: 496 U/L — ABNORMAL HIGH (ref 7–232)

## 2014-03-28 MED ORDER — ASPIRIN EC 81 MG PO TBEC
81.0000 mg | DELAYED_RELEASE_TABLET | Freq: Every day | ORAL | Status: DC
  Administered 2014-03-28 – 2014-04-01 (×5): 81 mg via ORAL
  Filled 2014-03-28 (×5): qty 1

## 2014-03-28 NOTE — Progress Notes (Signed)
Patient was noted to be much more unsteady today with mild left sided weakness and lean.  PT does not feel he is stable for discharge home and recommends CIR/rehab prior to return home.  He has minimal left arm weakness on exam but speech is more dysarthric tonight than this am.  ASA 81mg  daily started.  Will obtain MRI to rule out CVA as cause of gait instability (prior CT showed old right lacunar infarct).

## 2014-03-28 NOTE — Progress Notes (Signed)
Occupational Therapy Evaluation Patient Details Name: Carl Rios MRN: [id number] DOB: Jun 19, 1926 Today's Date: 03/28/2014    History of Present Illness 80 y.o. male. Pt w/ known hx of peripheral neuropathy presented after falling and "hitting a wall" early this AM when getting out of the shower and was down for over 12 hours b/f family was able to get him up. Per MD, he was found to be tachycardic, w/ elevated BUN/Cr ration and CK > 1500. Rhabdomyolysis. Hx of remote R lacunar infarcts.     Clinical Impression   PTA, pt lived alone independently. On eval, pt required Mod A for bed mobility and Max A with functional mobility after being at sink for grooming task. Pt used walker to get to sink, but forgot he was using RW and tried to walk through the walker, requiring Max A to prevent fall and get back to bed. Again, loss of balance to  L.  BP supine after session 157/76. Mod A for problem solving, poor postural control in standing and sitting with L lateral lean. At this time, pt is NOT safe to D/C home. Recommend short CIR stay to maximize funcitonal level of independence with ADL and mobility to return to PLOF. Discussed concerns with nurse.     Follow Up Recommendations  CIR;Supervision/Assistance - 24 hour    Equipment Recommendations  3 in 1 bedside comode;Tub/shower seat    Recommendations for Other Services Rehab consult     Precautions / Restrictions Precautions Precautions: Fall      Mobility Bed Mobility Overal bed mobility: Needs Assistance Bed Mobility: Supine to Sit;Sit to Supine     Supine to sit: Mod assist Sit to supine: Mod assist   General bed mobility comments: Unable how to problem solve to get OOB. PT fell to L x 2 when trying to sit EOB  Transfers Overall transfer level: Needs assistance Equipment used: Rolling walker (2 wheeled) Transfers: Sit to/from [facility] Sit to Stand: Mod assist Stand pivot transfers: Mod assist        General transfer comment: multiple vc to not pull up on RW when standing. Pt continued to pull up on walker. LOB x 3 to L.     Balance Overall balance assessment: Needs assistance Sitting-balance support: Feet supported;No upper extremity supported Sitting balance-Leahy Scale: Fair   Postural control: Left lateral lean Standing balance support: During functional activity;No upper extremity supported Standing balance-Leahy Scale: Poor Standing balance comment: falling to L. unaware of poor postural control                            ADL Overall ADL's : Needs assistance/impaired Eating/Feeding: Set up   Grooming: Minimal assistance;Cueing for safety   Upper Body Bathing: Min guard;Sitting   Lower Body Bathing: Moderate assistance;Sit to/from stand       Lower Body Dressing: Moderate assistance;Sit to/from stand   Toilet Transfer: Moderate assistance (simulated)           Functional mobility during ADLs: Moderate assistance;Cueing for safety;Cueing for sequencing (Pt walking through RW when going back to bed. Pt required Ma) General ADL Comments: Pt overall mod A with ADL and extremely high risk of falls. Poor awareness of deficits during ADL task. Postural control affected by attentional deficits.     Vision    will further assess. Pt with difficulty with smooth pursuits and saccadic eye movement  Perception   Will assess  Praxis      Pertinent Vitals/Pain Min c/o back pain BP supine 157/76 after session. Pt c/o dizziness     Hand Dominance Right   Extremity/Trunk Assessment Upper Extremity Assessment Upper Extremity Assessment: LUE deficits/detail LUE Deficits / Details: generalized weakness. LUE Sensation: decreased proprioception   Lower Extremity Assessment Lower Extremity Assessment: Generalized weakness   Cervical / Trunk Assessment Cervical / Trunk Assessment: Other exceptions Cervical / Trunk Exceptions: L bias    Communication Communication Communication: HOH   Cognition Arousal/Alertness: Awake/alert Behavior During Therapy: WFL for tasks assessed/performed Overall Cognitive Status: Impaired/Different from baseline (cno family available to determine baseline funciton) Area of Impairment: Safety/judgement;Awareness;Problem solving     Memory: Decreased short-term memory   Safety/Judgement: Decreased awareness of safety;Decreased awareness of deficits Awareness: Emergent Problem Solving: Slow processing;Decreased initiation;Difficulty sequencing;Requires verbal cues General Comments: Pt standing at sink to wash hands. Difficulty with depth perception to grab towel with L hand. "grabbing air". Pt leaning to L. Used RW to walk to sinkg. Began trying to walk through RW to turn andgo back to bed. Attentional deficits   General Comments       Exercises       Shoulder Instructions      Home Living Family/patient expects to be discharged to:: Private residence Living Arrangements: Alone Available Help at Discharge: Family Type of Home: House Home Access: Stairs to enter Technical brewer of Steps: 1   Home Layout: One level     Bathroom Shower/Tub: Occupational psychologist: Handicapped height Bathroom Accessibility: No   Home Equipment: [place] - single point          Prior Functioning/Environment Level of Independence: Independent        Comments: drove    OT Diagnosis: Generalized weakness;Cognitive deficits;Acute pain   OT Problem List: Decreased strength;Decreased activity tolerance;Impaired balance (sitting and/or standing);Decreased cognition;Decreased safety awareness;Decreased knowledge of use of DME or AE;Decreased knowledge of precautions;Pain   OT Treatment/Interventions: Self-care/ADL training;Therapeutic exercise;Energy conservation;DME and/or AE instruction;Therapeutic activities;Cognitive remediation/compensation;Patient/family education;Balance  training    OT Goals(Current goals can be found in the care plan section) Acute Rehab OT Goals Patient Stated Goal: get stronger OT Goal Formulation: With patient Time For Goal Achievement: 04/11/14 Potential to Achieve Goals: Good  OT Frequency: Min 2X/week   Barriers to D/C: Other (comment) (unsure of level of support)          Co-evaluation              End of Session Equipment Utilized During Treatment: Surveyor, mining Communication: Mobility status;Other (comment) (concerns regarding current functional status)  Activity Tolerance: Patient tolerated treatment well Patient left: in bed;with call bell/phone within reach;with bed alarm set   Time: 6283-1517 OT Time Calculation (min): 41 min Charges:  OT General Charges $OT Visit: 1 Procedure OT Evaluation $Initial OT Evaluation Tier I: 1 Procedure OT Treatments $Self Care/Home Management : 23-37 mins G-Codes:    Roney Jaffe Amelio Brosky 2014-04-09, 4:54 PM   Maurie Boettcher, OTR/L  [phone] 04/09/14

## 2014-03-28 NOTE — Progress Notes (Signed)
Subjective: Still sore but feels better.  Was able to walk with walker, PT yesterday.  Objective: Vital signs in last 24 hours: Temp:  [97.5 F (36.4 C)-98.1 F (36.7 C)] 98.1 F (36.7 C) (05/28 0420) Pulse Rate:  [51-54] 51 (05/28 0420) Resp:  [18-22] 18 (05/28 0420) BP: (148-152)/(54-98) 148/54 mmHg (05/28 0420) SpO2:  [93 %-100 %] 100 % (05/28 0420) Weight:  [89.813 kg (198 lb)] 89.813 kg (198 lb) (05/28 0420) Weight change: -4.989 kg (-11 lb) Last BM Date: 03/24/14  CBG (last 3)   Recent Labs  03/26/14 2112  GLUCAP 133*    Intake/Output from previous day: 05/27 0701 - 05/28 0700 In: 1070 [P.O.:1070] Out: 700 [Urine:700] Intake/Output this shift:    General appearance: alert and no distress Eyes: no scleral icterus Throat: oropharynx moist without erythema Resp: clear to auscultation bilaterally Cardio: regular rate and rhythm GI: soft, non-tender; bowel sounds normal; no masses,  no organomegaly Extremities: no clubbing, cyanosis or edema   Lab Results:  Recent Labs  03/27/14 0545 03/28/14 0445  NA 142 139  K 3.9 3.8  CL 103 105  CO2 23 20  GLUCOSE 129* 103*  BUN 28* 21  CREATININE 1.00 0.80  CALCIUM 9.4 8.2*    Recent Labs  03/27/14 0545  AST 38*  ALT 22  ALKPHOS 84  BILITOT 1.2  PROT 6.7  ALBUMIN 3.6    Recent Labs  03/27/14 0545 03/28/14 0445  WBC 11.2* 9.8  HGB 14.8 13.8  HCT 44.6 41.8  MCV 90.8 90.9  PLT 258 183   Lab Results  Component Value Date   INR 1.11 08/23/2013   INR 0.98 08/21/2013   INR 1.07 01/23/2012    Recent Labs  03/26/14 2125 03/27/14 0545 03/28/14 0445  CKTOTAL 1550* 1026* 496*  TROPONINI <0.30  --   --    No results found for this basename: TSH, T4TOTAL, FREET3, T3FREE, THYROIDAB,  in the last 72 hours No results found for this basename: VITAMINB12, FOLATE, FERRITIN, TIBC, IRON, RETICCTPCT,  in the last 72 hours  Studies/Results: Dg Thoracic Spine 2 View  03/26/2014   CLINICAL DATA:  Status  post fall; upper back pain.  EXAM: THORACIC SPINE - 2 VIEW  COMPARISON:  Chest radiograph performed 08/21/2013  FINDINGS: There is no evidence of fracture or subluxation. Vertebral bodies demonstrate normal height and alignment. Intervertebral disc spaces are preserved.  The visualized portions of both lungs are clear. The mediastinum is unremarkable in appearance.  IMPRESSION: No evidence of fracture or subluxation along the thoracic spine.   Electronically Signed   By: Garald Balding M.D.   On: 03/26/2014 23:29   Dg Lumbar Spine Complete  03/26/2014   CLINICAL DATA:  Status post fall.  Lower back pain.  EXAM: LUMBAR SPINE - COMPLETE 4+ VIEW  COMPARISON:  CT of the abdomen and pelvis performed 09/25/2013  FINDINGS: There is no evidence of fracture or subluxation. Vertebral bodies demonstrate normal height and alignment. Intervertebral disc spaces are preserved. The visualized neural foramina are grossly unremarkable in appearance.  The visualized bowel gas pattern is unremarkable in appearance; air and stool are noted within the colon. The sacroiliac joints are within normal limits. An aortoiliac stent graft is noted.  IMPRESSION: No evidence of fracture or subluxation along the lumbar spine.   Electronically Signed   By: Garald Balding M.D.   On: 03/26/2014 23:31   Dg Pelvis 1-2 Views  03/26/2014   CLINICAL DATA:  Status post fall; concern  for pelvic injury.  EXAM: PELVIS - 1-2 VIEW  COMPARISON:  CTA of the abdomen and pelvis performed 09/25/2013  FINDINGS: There is no evidence of fracture or dislocation. Both femoral heads are seated normally within their respective acetabula. No significant degenerative change is appreciated. The sacroiliac joints are unremarkable in appearance.  The visualized bowel gas pattern is grossly unremarkable in appearance. The patient's aortoiliac stent graft is partially imaged.  IMPRESSION: No evidence of fracture or dislocation.   Electronically Signed   By: Garald Balding  M.D.   On: 03/26/2014 23:33   Ct Head Wo Contrast  03/26/2014   CLINICAL DATA:  Status post fall.  Dizziness and weakness.  EXAM: CT HEAD WITHOUT CONTRAST  TECHNIQUE: Contiguous axial images were obtained from the base of the skull through the vertex without intravenous contrast.  COMPARISON:  None.  FINDINGS: There is no evidence of acute infarction, mass lesion, or intra- or extra-axial hemorrhage on CT.  Prominence of the ventricles and sulci reflects moderate cortical volume loss. Diffuse periventricular and subcortical white matter change likely reflects small vessel ischemic microangiopathy. Cerebellar atrophy is noted. A chronic lacunar infarct is noted at the right thalamus.  The brainstem and fourth ventricle are within normal limits. The cerebral hemispheres demonstrate grossly normal gray-white differentiation. No mass effect or midline shift is seen.  There is no evidence of fracture; visualized osseous structures are unremarkable in appearance. The orbits are within normal limits. The paranasal sinuses and mastoid air cells are well-aerated. No significant soft tissue abnormalities are seen.  IMPRESSION: 1. No evidence of traumatic intracranial injury or fracture. 2. Moderate cortical volume loss and diffuse small vessel ischemic microangiopathy. 3. Chronic lacunar infarct at the right thalamus.   Electronically Signed   By: Garald Balding M.D.   On: 03/26/2014 23:29     Medications: Scheduled: . DULoxetine  30 mg Oral Daily  . enoxaparin (LOVENOX) injection  40 mg Subcutaneous Q24H   Continuous: . sodium chloride 125 mL/hr at 03/28/14 3557    Assessment/Plan: Principal Problem: 1. Rhabdomyolysis- improving with IV fluid hydration.  Continue for additional 24 hours.  Ambulate Active Problems:  2. Acute renal failure- secondary to prerenal azotemia. BUN trending down 3. Dehydration- resolved- continue IV fluids  4. Unspecified hereditary and idiopathic peripheral neuropathy- may be  contributing to recurrent falls  5. Recurrent falls- continue PT/OT  Check orthostatics. ?autonomic insufficiency causing dizziness in setting of neuropathy. 6. Disposition- anticipate discharge tomorrow if stable.   LOS: 2 days   Marton Redwood 03/28/2014, 7:33 AM

## 2014-03-28 NOTE — Progress Notes (Signed)
Orthostatic vitals right arm:  Lying BP 145/64, pulse 77; sitting 146/50, pulse 87; standing 169/59 pulse 92.

## 2014-03-29 ENCOUNTER — Inpatient Hospital Stay (HOSPITAL_COMMUNITY): Payer: Medicare Other

## 2014-03-29 DIAGNOSIS — I6330 Cerebral infarction due to thrombosis of unspecified cerebral artery: Secondary | ICD-10-CM

## 2014-03-29 DIAGNOSIS — M6282 Rhabdomyolysis: Secondary | ICD-10-CM

## 2014-03-29 LAB — BASIC METABOLIC PANEL
BUN: 11 mg/dL (ref 6–23)
CHLORIDE: 104 meq/L (ref 96–112)
CO2: 21 mEq/L (ref 19–32)
Calcium: 8.6 mg/dL (ref 8.4–10.5)
Creatinine, Ser: 0.74 mg/dL (ref 0.50–1.35)
GFR calc Af Amer: >90 mL/min (ref >90)
GFR calc non Af Amer: 81 mL/min — ABNORMAL LOW (ref >90)
GLUCOSE: 113 mg/dL — AB (ref 70–99)
Potassium: 4.2 mEq/L (ref 3.7–5.3)
Sodium: 139 mEq/L (ref 137–147)

## 2014-03-29 LAB — CK: Total CK: 175 U/L (ref 7–232)

## 2014-03-29 LAB — CBC
HEMATOCRIT: 41.1 % (ref 39.0–52.0)
Hemoglobin: 13.5 g/dL (ref 13.0–17.0)
MCH: 29.7 pg (ref 26.0–34.0)
MCHC: 32.8 g/dL (ref 30.0–36.0)
MCV: 90.5 fL (ref 78.0–100.0)
Platelets: 215 10*3/uL (ref 150–400)
RBC: 4.54 MIL/uL (ref 4.22–5.81)
RDW: 14.7 % (ref 11.5–15.5)
WBC: 7.8 10*3/uL (ref 4.0–10.5)

## 2014-03-29 MED ORDER — ASPIRIN 81 MG PO TBEC: 81.0000 mg | DELAYED_RELEASE_TABLET | Freq: Every day | ORAL | Status: AC

## 2014-03-29 MED ORDER — HYDROCODONE-ACETAMINOPHEN 5-325 MG PO TABS: 1.0000 | ORAL_TABLET | ORAL | Status: DC | PRN

## 2014-03-29 MED ORDER — GADOBENATE DIMEGLUMINE 529 MG/ML IV SOLN
20.0000 mL | Freq: Once | INTRAVENOUS | Status: AC
  Administered 2014-03-29: 20 mL via INTRAVENOUS

## 2014-03-29 NOTE — Progress Notes (Signed)
CSW left handoff for weekend social worker. Was unable to see patient.  Rhea Pink, MSW, [place]

## 2014-03-29 NOTE — Progress Notes (Signed)
Subjective: Patient reports that he feels too weak and unstable to go home.  "Fall took it out of me".  Myalgias are improving.  Mid back pain persists.  Objective: Vital signs in last 24 hours: Temp:  [97.4 F (36.3 C)-98 F (36.7 C)] 97.8 F (36.6 C) (05/29 0458) Pulse Rate:  [77-92] 78 (05/29 0458) Resp:  [18-22] 22 (05/29 0458) BP: (145-169)/(50-82) 149/66 mmHg (05/29 0458) SpO2:  [94 %-96 %] 96 % (05/29 0458) Weight change:  Last BM Date: 03/24/14  CBG (last 3)   Recent Labs  03/26/14 2112  GLUCAP 133*    Intake/Output from previous day: 05/28 0701 - 05/29 0700 In: 1710 [P.O.:710; I.V.:1000] Out: 750 [Urine:750] Intake/Output this shift:    General appearance: alert, no distress and mildly dysarthric Eyes: no scleral icterus Throat: oropharynx moist without erythema Resp: clear to auscultation bilaterally Cardio: regular rate and rhythm and with bigemeny pattern of ectopy GI: soft, non-tender; bowel sounds normal; no masses,  no organomegaly Extremities: no clubbing, cyanosis or edema Neurologic: no focal weakness in arms or legs; mild diffuse weakness related to discomfort   Lab Results:  Recent Labs  03/27/14 0545 03/28/14 0445  NA 142 139  K 3.9 3.8  CL 103 105  CO2 23 20  GLUCOSE 129* 103*  BUN 28* 21  CREATININE 1.00 0.80  CALCIUM 9.4 8.2*    Recent Labs  03/27/14 0545  AST 38*  ALT 22  ALKPHOS 84  BILITOT 1.2  PROT 6.7  ALBUMIN 3.6    Recent Labs  03/27/14 0545 03/28/14 0445  WBC 11.2* 9.8  HGB 14.8 13.8  HCT 44.6 41.8  MCV 90.8 90.9  PLT 258 183   Lab Results  Component Value Date   INR 1.11 08/23/2013   INR 0.98 08/21/2013   INR 1.07 01/23/2012    Recent Labs  03/26/14 2125 03/27/14 0545 03/28/14 0445  CKTOTAL 1550* 1026* 496*  TROPONINI <0.30  --   --    No results found for this basename: TSH, T4TOTAL, FREET3, T3FREE, THYROIDAB,  in the last 72 hours No results found for this basename: VITAMINB12, FOLATE,  FERRITIN, TIBC, IRON, RETICCTPCT,  in the last 72 hours  Studies/Results: No results found.   Medications: Scheduled: . aspirin EC  81 mg Oral Daily  . DULoxetine  30 mg Oral Daily  . enoxaparin (LOVENOX) injection  40 mg Subcutaneous Q24H   Continuous: . sodium chloride 125 mL/hr at 03/29/14 0500    Assessment/Plan: Principal Problem:  1. Rhabdomyolysis- improving with IV fluid hydration but myalgias and generalized weakness persist.  Labs pending this am.  Continue PT-->needs rehab. Active Problems:  2. Acute renal failure- secondary to prerenal azotemia. BUN trending down- labs pending 3. Dehydration- resolved- continue IV fluids  4. Unspecified hereditary and idiopathic peripheral neuropathy- may be contributing to recurrent falls  5. Recurrent falls- Orthostatics negative.  PT reports leaning to the left with mild left sided weakness.  MRI ordered to rule out subacute CVA.  CIR/SW consults for rehab placement. 6. Disposition- anticipate discharge to CIR/SNF rehab when bed available if MRI negative.  Possibly tomorrow.  Med Rec completed with Rx for Hydrocodone on paper chart.   LOS: 3 days   Marton Redwood 03/29/2014, 7:36 AM

## 2014-03-29 NOTE — Progress Notes (Signed)
Rehab Admissions Coordinator Note:  Patient was screened by [name] for appropriateness for an Inpatient Acute Rehab Consult.  At this time, an inpatient rehab consult has already been ordered and is pending completion.  Carl Rios 03/29/2014, 9:46 AM  I can be reached at [phone].

## 2014-03-29 NOTE — Evaluation (Signed)
Clinical/Bedside Swallow Evaluation Patient Details  Name: Carl Rios MRN: [id number] Date of Birth: [birth year]  Today's Date: 03/29/2014 Time: 2703-5009 SLP Time Calculation (min): 21 min  Past Medical History:  Past Medical History  Diagnosis Date  . AAA (abdominal aortic aneurysm) without rupture   . Hyperlipidemia   . Diverticulosis   . GERD (gastroesophageal reflux disease)   . Hx of adenomatous colonic polyps   . Peripheral vascular disease   . Obesity   . Dyslipidemia   . History of colon polyps   . History of prostatitis   . Hearing difficulty   . Peripheral neuropathy    Past Surgical History:  Past Surgical History  Procedure Laterality Date  . Knee surgery      left knee in 1970's  . Colonoscopy  01/24/2012    Procedure: COLONOSCOPY;  Surgeon: Lafayette Dragon, MD;  Location: [facility];  Service: Endoscopy;  Laterality: N/A;  . Inguinal hernia repair    . Cataract extraction Bilateral   . Tonsillectomy    . Abdominal aortic endovascular stent graft N/A 08/23/2013    Procedure: ABDOMINAL AORTIC ENDOVASCULAR STENT GRAFT;  Surgeon: Mal Misty, MD;  [facility]: [facility] OR;  Service: Vascular;  Laterality: N/A;  . Abdominal aortic aneurysm repair     HPI:  80 year old pt that lives alone fell getting out of the shower, on floor 12 hrs. Rhabdomyolysis. MRI demonstrated R posterior cerebral artery infarct.Pt is HOH.     Assessment / Plan / Recommendation Clinical Impression  Pt does not have overt signs of aspiration or dysphagia with trials observed this afternoon despite mild weakness, although pt reports getting "choked" on dry, crumbly foods on lunch tray. Given decreased sustained attention and mild oral weakness, suspect pt may have experienced some mild premature spillage. Will downgrade to Dys 3 textures, continue thin liquids. SLP to follow briefly for tolerance and readiness for advancement.    Aspiration Risk  Mild    Diet Recommendation Dysphagia 3  (Mechanical Soft);Thin liquid   Liquid Administration via: Cup;Straw Medication Administration: Whole meds with puree (pt reports difficulty swallowing pills, try whole in puree as tolerated) Supervision: Patient able to self feed;Full supervision/cueing for compensatory strategies Compensations: Slow rate;Small sips/bites Postural Changes and/or Swallow Maneuvers: Seated upright 90 degrees;Upright 30-60 min after meal    Other  Recommendations Oral Care Recommendations: Oral care BID   Follow Up Recommendations  Inpatient Rehab;24 hour supervision/assistance    Frequency and Duration min 2x/week  1 week   Pertinent Vitals/Pain N/A    SLP Swallow Goals     Swallow Study Prior Functional Status       General Date of Onset: 03/28/14 HPI: 80 year old pt that lives alone fell getting out of the shower, on floor 12 hrs. Rhabdomyolysis. MRI demonstrated R posterior cerebral artery infarct.Pt is HOH.   Type of Study: Bedside swallow evaluation Previous Swallow Assessment: none in chart Diet Prior to this Study: Regular;Thin liquids Temperature Spikes Noted: No Respiratory Status: Room air History of Recent Intubation: No Behavior/Cognition: Alert;Cooperative;Pleasant mood;Requires cueing Oral Cavity - Dentition: Adequate natural dentition Self-Feeding Abilities: Able to feed self Patient Positioning: Upright in bed Baseline Vocal Quality: Clear Volitional Cough: Other (Comment) (UTA, pt reports it is painful to cough and asked not to) Volitional Swallow: Able to elicit    Oral/Motor/Sensory Function Overall Oral Motor/Sensory Function: Impaired Labial ROM: Within Functional Limits Labial Symmetry: Within Functional Limits Labial Strength: Reduced Lingual ROM: Within Functional Limits Lingual Symmetry:  Within Functional Limits Lingual Strength: Reduced Facial ROM: Within Functional Limits Facial Symmetry: Within Functional Limits Facial Strength: Within Functional  Limits Velum: Within Functional Limits Mandible: Within Functional Limits   Ice Chips Ice chips: Not tested   Thin Liquid Thin Liquid: Within functional limits Presentation: Straw;Self Fed    Nectar Thick Nectar Thick Liquid: Not tested   Honey Thick Honey Thick Liquid: Not tested   Puree Puree: Within functional limits Presentation: Self Fed;Spoon   Solid   GO    Solid: Within functional limits Presentation: Self Fed        Carl Rios, M.A. CCC-SLP [phone]  Carl Rios 03/29/2014,3:56 PM

## 2014-03-29 NOTE — Progress Notes (Signed)
Physical Therapy Treatment Patient Details Name: Carl Rios MRN: [id number] DOB: 1926-05-29 Today's Date: 03/29/2014    History of Present Illness 80 year old pt that lives alone fell getting out of the shower, on floor 12 hrs. Rhabdomyolysis. Noted left lean and weakness acutely with MRI demonstrating R posterior cerebral artery infarct.Pt is HOH and limited by this.      PT Comments    Pt pleasant but very HOH making education at times difficult. Son present throughout session and educated for pt deficits with balance and strength with pt and family encouraged to perform HEP throughout weekend and assist for transfer to chair with staff. Pt benefits from cues to correct balance and will continue to benefit from therapy as well as CIR at DC.  Follow Up Recommendations  CIR;Supervision/Assistance - 24 hour     Equipment Recommendations  None recommended by PT    Recommendations for Other Services Rehab consult     Precautions / Restrictions Precautions Precautions: Fall Precaution Comments: HOH Restrictions Weight Bearing Restrictions: No    Mobility  Bed Mobility Overal bed mobility: Needs Assistance Bed Mobility: Rolling;Sidelying to Sit Rolling: Min assist Sidelying to sit: Mod assist       General bed mobility comments: cues for how to complete and even with cues difficulty following commands with assist to elevate trunk from surface  Transfers Overall transfer level: Needs assistance   Transfers: Sit to/from Stand Sit to Stand: Min assist         General transfer comment: cues for hand placement, anterior translation and safety x 2 trials  Ambulation/Gait Ambulation/Gait assistance: Min assist Ambulation Distance (Feet): 100 Feet Assistive device: Rolling walker (2 wheeled) Gait Pattern/deviations: Step-through pattern;Decreased stride length   Gait velocity interpretation: <1.8 ft/sec, indicative of risk for recurrent falls General Gait Details: cues  for position in [place], pt with tendency to let left hand drift off walker, pulling RW toward right    Stairs            Wheelchair Mobility    Modified Rankin (Stroke Patients Only) Modified Rankin (Stroke Patients Only) Pre-Morbid Rankin Score: No symptoms Modified Rankin: Moderately severe disability     Balance Overall balance assessment: Needs assistance   Sitting balance-Leahy Scale: Fair Sitting balance - Comments: pt initially unaware of deficit with visual assist of lining his posture up with P.T. EOB 9 min EOB with multimodal cues to achieve midline Postural control: Posterior lean;Left lateral lean   Standing balance-Leahy Scale: Poor Standing balance comment: left lean, pt at mirror able to correct without cues                    Cognition Arousal/Alertness: Awake/alert Behavior During Therapy: WFL for tasks assessed/performed Overall Cognitive Status: Impaired/Different from baseline Area of Impairment: Safety/judgement;Awareness;Problem solving     Memory: Decreased short-term memory   Safety/Judgement: Decreased awareness of safety;Decreased awareness of deficits   Problem Solving: Slow processing;Decreased initiation;Difficulty sequencing;Requires verbal cues General Comments: Pt with significant posterior left lean particularly in sitting initially unaware. Pt also unaware of incontinent of urine. pt repeatedly instructed for need of assist with mobility but unable to recall after 5x education    Exercises General Exercises - Lower Extremity Long Arc Quad: AROM;Seated;Both;10 reps Hip Flexion/Marching: AROM;Seated;Both;10 reps Toe Raises: AROM;Seated;Both;10 reps    General Comments        Pertinent Vitals/Pain Pt reports soreness entire left side from fall, unrated, repositioned    Home Living  Prior Function            PT Goals (current goals can now be found in the care plan section) Progress towards  PT goals: Progressing toward goals    Frequency       PT Plan Discharge plan needs to be updated    Co-evaluation             End of Session Equipment Utilized During Treatment: Gait belt Activity Tolerance: Patient tolerated treatment well Patient left: in chair;with call bell/phone within Rios;with chair alarm set;with family/visitor present;with nursing/sitter in room     Time: 1400-1442 PT Time Calculation (min): 42 min  Charges:  $Gait Training: 8-22 mins $Therapeutic Exercise: 8-22 mins $Therapeutic Activity: 8-22 mins                    G Codes:      Carl Rios B Carl Rios 2014/04/18, 2:56 PM Carl Rios, Carl Rios

## 2014-03-29 NOTE — Progress Notes (Signed)
CSW attempted to speak with patient about SNF back up but patient was working with therapy at the time. CSW will attempt to discuss SNF backup options when patient is available.   Rhea Pink, MSW, [place]

## 2014-03-29 NOTE — Consult Note (Signed)
Physical Medicine and Rehabilitation Consult Reason for Consult: Rhabdomyolysis/acute renal failure Referring Physician: Dr. Brigitte Pulse   HPI: Carl Rios is a 80 y.o. right-handed male history of peripheral neuropathy. Presented 03/26/2014 after a fall and remained down for approximately 12 hours until found by family. Patient independent prior to admission living alone and driving. Noted total CK of 1550, troponin negative, white blood cell count 14,100 and BUN 27. Cranial CT scan with no evidence of intracranial abnormality noted chronic lacunar infarct at the right thalamus. The thoracic and lumbar spines negative for acute changes. Pelvis films without fracture. MRI of the brain revealed an acute right PCA infarct. Patient was placed on gentle IV fluids. Subcutaneous Lovenox added for DVT prophylaxis. Physical occupational therapy evaluations completed. Patient requiring Max assist to prevent falls in getting back to bed with loss of balance. Recommendations by occupational therapy are made for physical medicine rehabilitation consult.   Review of Systems  HENT: Positive for hearing loss.   Gastrointestinal:       GERD  Genitourinary: Positive for frequency.  Musculoskeletal: Positive for falls.  All other systems reviewed and are negative.  Past Medical History  Diagnosis Date  . AAA (abdominal aortic aneurysm) without rupture   . Hyperlipidemia   . Diverticulosis   . GERD (gastroesophageal reflux disease)   . Hx of adenomatous colonic polyps   . Peripheral vascular disease   . Obesity   . Dyslipidemia   . History of colon polyps   . History of prostatitis   . Hearing difficulty   . Peripheral neuropathy    Past Surgical History  Procedure Laterality Date  . Knee surgery      left knee in 1970's  . Colonoscopy  01/24/2012    Procedure: COLONOSCOPY;  Surgeon: Lafayette Dragon, MD;  Location: [facility];  Service: Endoscopy;  Laterality: N/A;  . Inguinal hernia repair      . Cataract extraction Bilateral   . Tonsillectomy    . Abdominal aortic endovascular stent graft N/A 08/23/2013    Procedure: ABDOMINAL AORTIC ENDOVASCULAR STENT GRAFT;  Surgeon: Mal Misty, MD;  Location: [facility] OR;  Service: Vascular;  Laterality: N/A;  . Abdominal aortic aneurysm repair     Family History  Problem Relation Age of Onset  . Heart disease Father 70    Heart diease before age 79   Social History:  reports that he quit smoking about 55 years ago. He has never used smokeless tobacco. He reports that he does not drink alcohol or use illicit drugs. Allergies:  Allergies  Allergen Reactions  . Sulfamethoxazole Other (See Comments)    UNKNOWN   Medications Prior to Admission  Medication Sig Dispense Refill  . DULoxetine (CYMBALTA) 30 MG capsule Take 1 capsule (30 mg total) by mouth daily.  90 capsule  1  . OVER THE COUNTER MEDICATION Take 1 tablet by mouth 2 (two) times daily. OTC supplement "Macular Protect Plus"        Home: Home Living Family/patient expects to be discharged to:: Private residence Living Arrangements: Alone Available Help at Discharge: Family Type of Home: House Home Access: Stairs to enter Technical brewer of Steps: 1 Home Layout: One level Home Equipment: [place] - single point Additional Comments: son has rolling walker at home and to find it prior to pt gong home  Functional History: Prior Function Level of Independence: Independent Comments: drove Functional Status:  Mobility: Bed Mobility Overal bed mobility: Needs Assistance Bed Mobility:  Supine to Sit;Sit to Supine Supine to sit: Mod assist Sit to supine: Mod assist General bed mobility comments: Unable how to problem solve to get OOB. PT fell to L x 2 when trying to sit EOB Transfers Overall transfer level: Needs assistance Equipment used: Rolling walker (2 wheeled) Transfers: Sit to/from [facility] Sit to Stand: Mod assist Stand pivot transfers: Mod  assist General transfer comment: multiple vc to not pull up on RW when standing. Pt continued to pull up on walker. LOB x 3 to L.  Ambulation/Gait Ambulation/Gait assistance: Min assist Ambulation Distance (Feet): 80 Feet Assistive device: Rolling walker (2 wheeled) (and chair following pt) Gait Pattern/deviations: Step-through pattern;Shuffle;Trunk flexed General Gait Details: lots of verbal cues to stay close to walker and to look up while walking.  pt had hard time stearing the RW - wanted to steer to right.    ADL: ADL Overall ADL's : Needs assistance/impaired Eating/Feeding: Set up Grooming: Minimal assistance;Cueing for safety Upper Body Bathing: Min guard;Sitting Lower Body Bathing: Moderate assistance;Sit to/from stand Lower Body Dressing: Moderate assistance;Sit to/from stand Toilet Transfer: Moderate assistance (simulated) Functional mobility during ADLs: Moderate assistance;Cueing for safety;Cueing for sequencing (Pt walking through RW when going back to bed. Pt required Ma) General ADL Comments: Pt overall mod A with ADL and extremely high risk of falls. Poor awareness of deficits during ADL task. Postural control affected by attentional deficits.  Cognition: Cognition Overall Cognitive Status: Impaired/Different from baseline (cno family available to determine baseline funciton) Orientation Level: Oriented to person;Oriented to time;Disoriented to place;Disoriented to situation Cognition Arousal/Alertness: Awake/alert Behavior During Therapy: WFL for tasks assessed/performed Overall Cognitive Status: Impaired/Different from baseline (cno family available to determine baseline funciton) Area of Impairment: Safety/judgement;Awareness;Problem solving Memory: Decreased short-term memory Safety/Judgement: Decreased awareness of safety;Decreased awareness of deficits Awareness: Emergent Problem Solving: Slow processing;Decreased initiation;Difficulty sequencing;Requires  verbal cues General Comments: Pt standing at sink to wash hands. Difficulty with depth perception to grab towel with L hand. "grabbing air". Pt leaning to L. Used RW to walk to sinkg. Began trying to walk through RW to turn andgo back to bed. Attentional deficits  Blood pressure 149/66, pulse 78, temperature 97.8 F (36.6 C), temperature source Oral, resp. rate 22, height 5\' 8"  (1.727 m), weight 89.813 kg (198 lb), SpO2 96.00%. Physical Exam  HENT:  Head: Normocephalic.  Eyes: EOM are normal.  Neck: Normal range of motion. Neck supple. No thyromegaly present.  Cardiovascular: Normal rate and regular rhythm.   Respiratory: Effort normal and breath sounds normal. No respiratory distress.  GI: Soft. Bowel sounds are normal. He exhibits no distension.  Neurological: He is alert.  Patient is very hard of hearing. Mild left facial droop. Decreased FMC LUE and LLE more than right side. Strength nearly symmetrical. LE's 3/5 HF, 3+/5, 4/5 ankles. He is appropriate to person, place, date of birth and age. He follows full commands.  Skin: Skin is warm.  Abrasions throughout the left leg.  Psychiatric: He has a normal mood and affect.    No results found for this or any previous visit (from the past 24 hour(s)). No results found.  Assessment/Plan: Diagnosis: right PCA infarct, rhabdomyolysis 1. Does the need for close, 24 hr/day medical supervision in concert with the patient's rehab needs make it unreasonable for this patient to be served in a less intensive setting? Yes 2. Co-Morbidities requiring supervision/potential complications: acute renal failure, GERD 3. Due to bladder management, bowel management, safety, skin/wound care, disease management, medication administration, pain management  and patient education, does the patient require 24 hr/day rehab nursing? Yes 4. Does the patient require coordinated care of a physician, rehab nurse, PT (1-2 hrs/day, 5 days/week), OT (1-2 hrs/day, 5  days/week) and SLP (1-2 hrs/day, 5 days/week) to address physical and functional deficits in the context of the above medical diagnosis(es)? Yes Addressing deficits in the following areas: balance, endurance, locomotion, strength, transferring, bowel/bladder control, bathing, dressing, feeding, grooming, toileting, cognition, speech and psychosocial support 5. Can the patient actively participate in an intensive therapy program of at least 3 hrs of therapy per day at least 5 days per week? Yes 6. The potential for patient to make measurable gains while on inpatient rehab is excellent 7. Anticipated functional outcomes upon discharge from inpatient rehab are supervision and min assist  with PT, supervision and min assist with OT, modified independent and supervision with SLP. 8. Estimated rehab length of stay to reach the above functional goals is: 17-24 days 9. Does the patient have adequate social supports to accommodate these discharge functional goals? Yes 10. Anticipated D/C setting: Home 11. Anticipated post D/C treatments: HH therapy and Outpatient therapy 12. Overall Rehab/Functional Prognosis: excellent  RECOMMENDATIONS: This patient's condition is appropriate for continued rehabilitative care in the following setting: CIR Patient has agreed to participate in recommended program. Yes Note that insurance prior authorization may be required for reimbursement for recommended care.  Comment: Rehab Admissions Coordinator to follow up.  Thanks,  Meredith Staggers, MD, Mellody Drown     03/29/2014

## 2014-03-30 DIAGNOSIS — I059 Rheumatic mitral valve disease, unspecified: Secondary | ICD-10-CM

## 2014-03-30 LAB — BASIC METABOLIC PANEL
BUN: 10 mg/dL (ref 6–23)
CHLORIDE: 106 meq/L (ref 96–112)
CO2: 24 meq/L (ref 19–32)
Calcium: 9 mg/dL (ref 8.4–10.5)
Creatinine, Ser: 0.79 mg/dL (ref 0.50–1.35)
GFR calc Af Amer: >90 mL/min (ref >90)
GFR calc non Af Amer: 79 mL/min — ABNORMAL LOW (ref >90)
GLUCOSE: 101 mg/dL — AB (ref 70–99)
Potassium: 3.8 mEq/L (ref 3.7–5.3)
Sodium: 143 mEq/L (ref 137–147)

## 2014-03-30 LAB — CBC
HEMATOCRIT: 42.9 % (ref 39.0–52.0)
Hemoglobin: 14.4 g/dL (ref 13.0–17.0)
MCH: 30.1 pg (ref 26.0–34.0)
MCHC: 33.6 g/dL (ref 30.0–36.0)
MCV: 89.6 fL (ref 78.0–100.0)
PLATELETS: 256 10*3/uL (ref 150–400)
RBC: 4.79 MIL/uL (ref 4.22–5.81)
RDW: 14.6 % (ref 11.5–15.5)
WBC: 7.2 10*3/uL (ref 4.0–10.5)

## 2014-03-30 LAB — CK: Total CK: 168 U/L (ref 7–232)

## 2014-03-30 LAB — LIPID PANEL
Cholesterol: 164 mg/dL (ref 0–200)
HDL: 34 mg/dL — AB (ref >39)
LDL CALC: 107 mg/dL — AB (ref 0–99)
Total CHOL/HDL Ratio: 4.8 RATIO
Triglycerides: 116 mg/dL (ref <150)
VLDL: 23 mg/dL (ref 0–40)

## 2014-03-30 NOTE — Progress Notes (Signed)
Echocardiogram 2D Echocardiogram has been performed.  Carl Rios [place] 03/30/2014, 8:25 AM

## 2014-03-30 NOTE — Progress Notes (Addendum)
Subjective: Feels okay this a.m.. Still a bit sore.  Objective: Vital signs in last 24 hours: Temp:  [97.7 F (36.5 C)-98.8 F (37.1 C)] 97.8 F (36.6 C) (05/30 0746) Pulse Rate:  [42-96] 42 (05/30 0746) Resp:  [18-22] 22 (05/30 0746) BP: (146-176)/(51-88) 157/63 mmHg (05/30 0746) SpO2:  [88 %-98 %] 88 % (05/30 0746) Weight:  [92.987 kg (205 lb)] 92.987 kg (205 lb) (05/30 0446) Weight change:  Last BM Date: 03/24/14  Intake/Output from previous day: 05/29 0701 - 05/30 0700 In: -  Out: 300 [Urine:300] Intake/Output this shift:    General appearance: alert, cooperative and appears stated age Resp: clear to auscultation bilaterally Cardio: regular rate and rhythm, S1, S2 normal, no murmur, click, rub or gallop GI: soft, non-tender; bowel sounds normal; no masses,  no organomegaly Extremities: normal. Neuro:  Alert oriented , moe times 4 noted. Generally weak.  Lab Results:  Recent Labs  03/29/14 1215 03/30/14 0540  WBC 7.8 7.2  HGB 13.5 14.4  HCT 41.1 42.9  PLT 215 256   BMET  Recent Labs  03/29/14 1215 03/30/14 0540  NA 139 143  K 4.2 3.8  CL 104 106  CO2 21 24  GLUCOSE 113* 101*  BUN 11 10  CREATININE 0.74 0.79  CALCIUM 8.6 9.0   CMET CMP     Component Value Date/Time   NA 143 03/30/2014 0540   K 3.8 03/30/2014 0540   CL 106 03/30/2014 0540   CO2 24 03/30/2014 0540   GLUCOSE 101* 03/30/2014 0540   BUN 10 03/30/2014 0540   CREATININE 0.79 03/30/2014 0540   CREATININE 0.94 03/13/2014 1131   CALCIUM 9.0 03/30/2014 0540   PROT 6.7 03/27/2014 0545   ALBUMIN 3.6 03/27/2014 0545   AST 38* 03/27/2014 0545   ALT 22 03/27/2014 0545   ALKPHOS 84 03/27/2014 0545   BILITOT 1.2 03/27/2014 0545   GFRNONAA 79* 03/30/2014 0540   GFRAA >90 03/30/2014 0540     Studies/Results: Mr Jodene Nam Head Wo Contrast  03/29/2014   CLINICAL DATA:  Gait instability.  Fall.  Rule out CVA.  EXAM: MRI HEAD WITHOUT AND WITH CONTRAST  MRA HEAD WITHOUT CONTRAST  TECHNIQUE: Multiplanar, multiecho  pulse sequences of the brain and surrounding structures were obtained without and with intravenous contrast. Angiographic images of the head were obtained using MRA technique without contrast.  CONTRAST:  38mL MULTIHANCE GADOBENATE DIMEGLUMINE 529 MG/ML IV SOLN  COMPARISON:  CT head 03/26/2012  FINDINGS: MRI HEAD FINDINGS  Acute right PCA infarct. Acute infarct in the right lateral thalamus and in the right occipital lobe. No other areas of acute infarct.  Moderate atrophy. Extensive chronic microvascular ischemic changes throughout the cerebral white matter and pons. Chronic infarct left thalamus. Ventricular enlargement consistent with atrophy.  Negative for intracranial hemorrhage. Negative for mass or edema. Normal enhancement following contrast administration.  Paranasal sinuses are clear.  MRA HEAD FINDINGS  Both vertebral arteries are patent to the basilar. The basilar is widely patent. Superior cerebellar arteries are patent bilaterally. Posterior cerebral arteries are patent bilaterally with a moderate stenosis in the left mid PCA. Right PCA widely patent.  Mild atherosclerotic disease in the cavernous carotid bilaterally. Mild to moderate atherosclerotic disease involving the middle cerebral artery branches bilaterally. Mild left M1 segment stenosis. Moderate disease in the right anterior cerebral artery. Left anterior cerebral artery widely patent.  Negative for cerebral aneurysm.  IMPRESSION: Acute infarct right posterior cerebral artery territory.  Atrophy and advanced chronic microvascular ischemia.  Moderate intracranial atherosclerotic disease. Of note, the right posterior cerebral artery is widely patent.   Electronically Signed   By: Franchot Gallo M.D.   On: 03/29/2014 11:19   Mr Jeri Cos JK Contrast  03/29/2014   CLINICAL DATA:  Gait instability.  Fall.  Rule out CVA.  EXAM: MRI HEAD WITHOUT AND WITH CONTRAST  MRA HEAD WITHOUT CONTRAST  TECHNIQUE: Multiplanar, multiecho pulse sequences of the  brain and surrounding structures were obtained without and with intravenous contrast. Angiographic images of the head were obtained using MRA technique without contrast.  CONTRAST:  23mL MULTIHANCE GADOBENATE DIMEGLUMINE 529 MG/ML IV SOLN  COMPARISON:  CT head 03/26/2012  FINDINGS: MRI HEAD FINDINGS  Acute right PCA infarct. Acute infarct in the right lateral thalamus and in the right occipital lobe. No other areas of acute infarct.  Moderate atrophy. Extensive chronic microvascular ischemic changes throughout the cerebral white matter and pons. Chronic infarct left thalamus. Ventricular enlargement consistent with atrophy.  Negative for intracranial hemorrhage. Negative for mass or edema. Normal enhancement following contrast administration.  Paranasal sinuses are clear.  MRA HEAD FINDINGS  Both vertebral arteries are patent to the basilar. The basilar is widely patent. Superior cerebellar arteries are patent bilaterally. Posterior cerebral arteries are patent bilaterally with a moderate stenosis in the left mid PCA. Right PCA widely patent.  Mild atherosclerotic disease in the cavernous carotid bilaterally. Mild to moderate atherosclerotic disease involving the middle cerebral artery branches bilaterally. Mild left M1 segment stenosis. Moderate disease in the right anterior cerebral artery. Left anterior cerebral artery widely patent.  Negative for cerebral aneurysm.  IMPRESSION: Acute infarct right posterior cerebral artery territory.  Atrophy and advanced chronic microvascular ischemia.  Moderate intracranial atherosclerotic disease. Of note, the right posterior cerebral artery is widely patent.   Electronically Signed   By: Franchot Gallo M.D.   On: 03/29/2014 11:19    Medications:  . aspirin EC  81 mg Oral Daily  . DULoxetine  30 mg Oral Daily  . enoxaparin (LOVENOX) injection  40 mg Subcutaneous Q24H     Assessment/Plan:  Principal Problem:   Rhabdomyolysis-improving. Off iv fluid. Follow  bun/cr. Active Problems:   Unspecified hereditary and idiopathic peripheral neuropathy-pt/ot   Acute renal failure:  improving   Dehydration-better Subacute stroke:  Pt/ot and he will need some sort of rehab.   Echo done today and pending. Carotids pending. MR noted. On asa now (was not on this previously). Dysphagia:  Appreciate ST recs.  D3 diet ordered.   LOS: 4 days   Jerlyn Ly, MD 03/30/2014, 9:44 AM

## 2014-03-31 DIAGNOSIS — I639 Cerebral infarction, unspecified: Secondary | ICD-10-CM | POA: Diagnosis present

## 2014-03-31 LAB — BASIC METABOLIC PANEL
BUN: 15 mg/dL (ref 6–23)
CALCIUM: 8.9 mg/dL (ref 8.4–10.5)
CO2: 22 meq/L (ref 19–32)
Chloride: 102 mEq/L (ref 96–112)
Creatinine, Ser: 0.82 mg/dL (ref 0.50–1.35)
GFR calc Af Amer: >90 mL/min (ref >90)
GFR calc non Af Amer: 78 mL/min — ABNORMAL LOW (ref >90)
GLUCOSE: 105 mg/dL — AB (ref 70–99)
Potassium: 3.9 mEq/L (ref 3.7–5.3)
SODIUM: 138 meq/L (ref 137–147)

## 2014-03-31 LAB — CBC
HCT: 42.8 % (ref 39.0–52.0)
HEMOGLOBIN: 14.5 g/dL (ref 13.0–17.0)
MCH: 30.1 pg (ref 26.0–34.0)
MCHC: 33.9 g/dL (ref 30.0–36.0)
MCV: 88.8 fL (ref 78.0–100.0)
Platelets: 259 10*3/uL (ref 150–400)
RBC: 4.82 MIL/uL (ref 4.22–5.81)
RDW: 14.6 % (ref 11.5–15.5)
WBC: 7.6 10*3/uL (ref 4.0–10.5)

## 2014-03-31 LAB — CK: CK TOTAL: 93 U/L (ref 7–232)

## 2014-03-31 NOTE — Progress Notes (Signed)
Occupational Therapy Treatment Patient Details Name: Carl Rios MRN: [id number] DOB: 08-06-1926 Today's Date: 03/31/2014    History of present illness 80 y.o. male. Pt w/ known hx of peripheral neuropathy presented after falling and "hitting a wall" early this AM when getting out of the shower and was down for over 12 hours b/f family was able to get him up. Per MD, he was found to be tachycardic, w/ elevated BUN/Cr ration and CK > 1500. Rhabdomyolysis. Hx of remote R lacunar infarcts. 80 y.o. male. Pt w/ known hx of peripheral neuropathy presented after falling and "hitting a wall" early this AM when getting out of the shower and was down for over 12 hours b/f family was able to get him up. Per MD, he was found to be tachycardic, w/ elevated BUN/Cr ration and CK > 1500. Rhabdomyolysis. Hx of remote R lacunar infarcts.   OT comments  Pt progressing towards goals and improved since last OT session. Continue to recommend CIR for additional rehab prior to d/c home.   Follow Up Recommendations  CIR;Supervision/Assistance - 24 hour    Equipment Recommendations  3 in 1 bedside comode;Tub/shower seat    Recommendations for Other Services Rehab consult    Precautions / Restrictions Precautions Precautions: Fall Precaution Comments: HOH Restrictions Weight Bearing Restrictions: No       Mobility Bed Mobility Overal bed mobility: Needs Assistance Bed Mobility: Rolling;Sidelying to Sit Rolling: Min guard Sidelying to sit: Min assist       General bed mobility comments: cues for log roll technique as pt c/o back pain.  Transfers Overall transfer level: Needs assistance Equipment used: Rolling walker (2 wheeled) Transfers: Sit to/from Stand Sit to Stand: Min assist;Min guard         General transfer comment: Min A from low chair. Cues for hand placement/technique.    Balance                                   ADL Overall ADL's : Needs assistance/impaired      Grooming: Wash/dry face;Min guard;Standing           Upper Body Dressing : Sitting;Set up;Supervision/safety   Lower Body Dressing: Sitting/lateral leans;Minimal assistance (socks)   Toilet Transfer: Minimal assistance;Ambulation;Regular Toilet;RW;Grab bars   Toileting- Clothing Manipulation and Hygiene: Minimal assistance;Sit to/from stand       Functional mobility during ADLs: Minimal assistance;Min guard;Rolling walker General ADL Comments: Pt soiled gown due to catheter not being secured well. Performed LB bathing at sink as well as donned/doffed socks. Pt also ambulated in hallway. Min A for sidestepping with walker and also with turns in hallway.  Pt fatigued during session and educated on deep breathing technique-took break.      Vision                     Perception     Praxis      Cognition   Behavior During Therapy: [facility] for tasks assessed/performed Overall Cognitive Status: Impaired/Different from baseline Area of Impairment: Problem solving              Problem Solving: Slow processing      Extremity/Trunk Assessment               Exercises     Shoulder Instructions       General Comments      Pertinent Vitals/ Pain  C/o back pain at beginning of session, but did not rate. Pillow placed behind back at end of session.  Home Living                                          Prior Functioning/Environment              Frequency Min 2X/week     Progress Toward Goals  OT Goals(current goals can now be found in the care plan section)  Progress towards OT goals: Progressing toward goals  Acute Rehab OT Goals Patient Stated Goal: not stated OT Goal Formulation: With patient Time For Goal Achievement: 04/11/14 Potential to Achieve Goals: Good ADL Goals Pt Will Perform Lower Body Bathing: with supervision;sit to/from stand Pt Will Perform Lower Body Dressing: with supervision;sit to/from stand Pt  Will Transfer to Toilet: with supervision;ambulating;regular height toilet;grab bars Pt Will Perform Toileting - Clothing Manipulation and hygiene: with supervision;sit to/from stand Additional ADL Goal #1: demonstrate anticipatory awareness during ADL task without cuing  Plan Discharge plan remains appropriate    Co-evaluation                 End of Session Equipment Utilized During Treatment: Rolling walker;Gait belt   Activity Tolerance Patient tolerated treatment well   Patient Left in chair;with call bell/phone within reach;with chair alarm set   Nurse Communication          Time: 6222-9798 OT Time Calculation (min): 32 min  Charges: OT General Charges $OT Visit: 1 Procedure OT Treatments $Self Care/Home Management : 8-22 mins $Therapeutic Activity: 8-22 mins  Benito Mccreedy OTR/L [phone] 03/31/2014, 1:14 PM

## 2014-03-31 NOTE — Progress Notes (Signed)
Subjective: He is doing okay. Speech is a bit better. No focal weakness. Hard of hearing.  Objective: Vital signs in last 24 hours: Temp:  [97.7 F (36.5 C)-98 F (36.7 C)] 98 F (36.7 C) (05/31 0354) Pulse Rate:  [83-100] 84 (05/31 0633) Resp:  [16-19] 16 (05/31 0633) BP: (144-168)/(83-88) 161/83 mmHg (05/31 0633) SpO2:  [92 %-95 %] 92 % (05/31 6568) Weight change:  Last BM Date: 03/24/14  Intake/Output from previous day: 05/30 0701 - 05/31 0700 In: -  Out: 1900 [Urine:1900] Intake/Output this shift:    General appearance: alert, cooperative and appears stated age Resp: clear to auscultation bilaterally Cardio: regular rate and rhythm, S1, S2 normal, no murmur, click, rub or gallop GI: soft, non-tender; bowel sounds normal; no masses,  no organomegaly Extremities: extremities normal, atraumatic, no cyanosis or edema Neurologic: Grossly normal Sitting in chair.  Lab Results:  Recent Labs  03/30/14 0540 03/31/14 0612  WBC 7.2 7.6  HGB 14.4 14.5  HCT 42.9 42.8  PLT 256 259   BMET  Recent Labs  03/30/14 0540 03/31/14 0612  NA 143 138  K 3.8 3.9  CL 106 102  CO2 24 22  GLUCOSE 101* 105*  BUN 10 15  CREATININE 0.79 0.82  CALCIUM 9.0 8.9   CMET CMP     Component Value Date/Time   NA 138 03/31/2014 0612   K 3.9 03/31/2014 0612   CL 102 03/31/2014 0612   CO2 22 03/31/2014 0612   GLUCOSE 105* 03/31/2014 0612   BUN 15 03/31/2014 0612   CREATININE 0.82 03/31/2014 0612   CREATININE 0.94 03/13/2014 1131   CALCIUM 8.9 03/31/2014 0612   PROT 6.7 03/27/2014 0545   ALBUMIN 3.6 03/27/2014 0545   AST 38* 03/27/2014 0545   ALT 22 03/27/2014 0545   ALKPHOS 84 03/27/2014 0545   BILITOT 1.2 03/27/2014 0545   GFRNONAA 78* 03/31/2014 0612   GFRAA >90 03/31/2014 0612     Studies/Results: Mr Jodene Nam Head Wo Contrast  03/29/2014   CLINICAL DATA:  Gait instability.  Fall.  Rule out CVA.  EXAM: MRI HEAD WITHOUT AND WITH CONTRAST  MRA HEAD WITHOUT CONTRAST  TECHNIQUE: Multiplanar,  multiecho pulse sequences of the brain and surrounding structures were obtained without and with intravenous contrast. Angiographic images of the head were obtained using MRA technique without contrast.  CONTRAST:  107mL MULTIHANCE GADOBENATE DIMEGLUMINE 529 MG/ML IV SOLN  COMPARISON:  CT head 03/26/2012  FINDINGS: MRI HEAD FINDINGS  Acute right PCA infarct. Acute infarct in the right lateral thalamus and in the right occipital lobe. No other areas of acute infarct.  Moderate atrophy. Extensive chronic microvascular ischemic changes throughout the cerebral white matter and pons. Chronic infarct left thalamus. Ventricular enlargement consistent with atrophy.  Negative for intracranial hemorrhage. Negative for mass or edema. Normal enhancement following contrast administration.  Paranasal sinuses are clear.  MRA HEAD FINDINGS  Both vertebral arteries are patent to the basilar. The basilar is widely patent. Superior cerebellar arteries are patent bilaterally. Posterior cerebral arteries are patent bilaterally with a moderate stenosis in the left mid PCA. Right PCA widely patent.  Mild atherosclerotic disease in the cavernous carotid bilaterally. Mild to moderate atherosclerotic disease involving the middle cerebral artery branches bilaterally. Mild left M1 segment stenosis. Moderate disease in the right anterior cerebral artery. Left anterior cerebral artery widely patent.  Negative for cerebral aneurysm.  IMPRESSION: Acute infarct right posterior cerebral artery territory.  Atrophy and advanced chronic microvascular ischemia.  Moderate intracranial atherosclerotic disease.  Of note, the right posterior cerebral artery is widely patent.   Electronically Signed   By: Franchot Gallo M.D.   On: 03/29/2014 11:19   Mr Jeri Cos DX Contrast  03/29/2014   CLINICAL DATA:  Gait instability.  Fall.  Rule out CVA.  EXAM: MRI HEAD WITHOUT AND WITH CONTRAST  MRA HEAD WITHOUT CONTRAST  TECHNIQUE: Multiplanar, multiecho pulse  sequences of the brain and surrounding structures were obtained without and with intravenous contrast. Angiographic images of the head were obtained using MRA technique without contrast.  CONTRAST:  56mL MULTIHANCE GADOBENATE DIMEGLUMINE 529 MG/ML IV SOLN  COMPARISON:  CT head 03/26/2012  FINDINGS: MRI HEAD FINDINGS  Acute right PCA infarct. Acute infarct in the right lateral thalamus and in the right occipital lobe. No other areas of acute infarct.  Moderate atrophy. Extensive chronic microvascular ischemic changes throughout the cerebral white matter and pons. Chronic infarct left thalamus. Ventricular enlargement consistent with atrophy.  Negative for intracranial hemorrhage. Negative for mass or edema. Normal enhancement following contrast administration.  Paranasal sinuses are clear.  MRA HEAD FINDINGS  Both vertebral arteries are patent to the basilar. The basilar is widely patent. Superior cerebellar arteries are patent bilaterally. Posterior cerebral arteries are patent bilaterally with a moderate stenosis in the left mid PCA. Right PCA widely patent.  Mild atherosclerotic disease in the cavernous carotid bilaterally. Mild to moderate atherosclerotic disease involving the middle cerebral artery branches bilaterally. Mild left M1 segment stenosis. Moderate disease in the right anterior cerebral artery. Left anterior cerebral artery widely patent.  Negative for cerebral aneurysm.  IMPRESSION: Acute infarct right posterior cerebral artery territory.  Atrophy and advanced chronic microvascular ischemia.  Moderate intracranial atherosclerotic disease. Of note, the right posterior cerebral artery is widely patent.   Electronically Signed   By: Franchot Gallo M.D.   On: 03/29/2014 11:19    Medications: I have reviewed the patient's current medications.  Marland Kitchen aspirin EC  81 mg Oral Daily  . DULoxetine  30 mg Oral Daily  . enoxaparin (LOVENOX) injection  40 mg Subcutaneous Q24H      Assessment/Plan:  Principal Problem:   Rhabdomyolysis-doing better Active Problems:   Unspecified hereditary and idiopathic peripheral neuropathy-follow   Acute renal failure-improved   Dehydration-improved  Stroke-he is improving.i think he would do well on on the rehab service.  It looks like he may be able to go to CIR.  On asa now, on D3 diet.  Cont. PT and OT.     LOS: 5 days   Jerlyn Ly, MD 03/31/2014, 9:59 AM

## 2014-03-31 NOTE — Progress Notes (Signed)
VASCULAR LAB PRELIMINARY  PRELIMINARY  PRELIMINARY  PRELIMINARY  Carotid Dopplers completed.    Preliminary report:  1-39% ICA stenosis.  Vertebral artery flow is antegrade.  Iantha Fallen, RVT 03/31/2014, 10:56 AM

## 2014-04-01 ENCOUNTER — Inpatient Hospital Stay (HOSPITAL_COMMUNITY)
Admission: RE | Admit: 2014-04-01 | Discharge: 2014-04-15 | DRG: 945 | Disposition: A | Discharge status: Skilled Nursing Facility | Payer: Medicare Other | Source: Intra-hospital | Attending: Physical Medicine & Rehabilitation | Admitting: Physical Medicine & Rehabilitation

## 2014-04-01 DIAGNOSIS — K5732 Diverticulitis of large intestine without perforation or abscess without bleeding: Secondary | ICD-10-CM | POA: Diagnosis not present

## 2014-04-01 DIAGNOSIS — K219 Gastro-esophageal reflux disease without esophagitis: Secondary | ICD-10-CM | POA: Diagnosis present

## 2014-04-01 DIAGNOSIS — A498 Other bacterial infections of unspecified site: Secondary | ICD-10-CM | POA: Diagnosis present

## 2014-04-01 DIAGNOSIS — G609 Hereditary and idiopathic neuropathy, unspecified: Secondary | ICD-10-CM | POA: Diagnosis present

## 2014-04-01 DIAGNOSIS — I498 Other specified cardiac arrhythmias: Secondary | ICD-10-CM | POA: Diagnosis present

## 2014-04-01 DIAGNOSIS — I739 Peripheral vascular disease, unspecified: Secondary | ICD-10-CM | POA: Diagnosis not present

## 2014-04-01 DIAGNOSIS — E785 Hyperlipidemia, unspecified: Secondary | ICD-10-CM | POA: Diagnosis present

## 2014-04-01 DIAGNOSIS — I10 Essential (primary) hypertension: Secondary | ICD-10-CM | POA: Diagnosis present

## 2014-04-01 DIAGNOSIS — Z860100 Personal history of colon polyps, unspecified: Secondary | ICD-10-CM

## 2014-04-01 DIAGNOSIS — E669 Obesity, unspecified: Secondary | ICD-10-CM | POA: Diagnosis present

## 2014-04-01 DIAGNOSIS — I714 Abdominal aortic aneurysm, without rupture, unspecified: Secondary | ICD-10-CM | POA: Diagnosis not present

## 2014-04-01 DIAGNOSIS — Z5189 Encounter for other specified aftercare: Principal | ICD-10-CM

## 2014-04-01 DIAGNOSIS — R269 Unspecified abnormalities of gait and mobility: Secondary | ICD-10-CM

## 2014-04-01 DIAGNOSIS — G608 Other hereditary and idiopathic neuropathies: Secondary | ICD-10-CM | POA: Diagnosis not present

## 2014-04-01 DIAGNOSIS — M6282 Rhabdomyolysis: Secondary | ICD-10-CM | POA: Diagnosis present

## 2014-04-01 DIAGNOSIS — Z87891 Personal history of nicotine dependence: Secondary | ICD-10-CM | POA: Diagnosis not present

## 2014-04-01 DIAGNOSIS — Z7982 Long term (current) use of aspirin: Secondary | ICD-10-CM

## 2014-04-01 DIAGNOSIS — I69998 Other sequelae following unspecified cerebrovascular disease: Secondary | ICD-10-CM | POA: Diagnosis not present

## 2014-04-01 DIAGNOSIS — R279 Unspecified lack of coordination: Secondary | ICD-10-CM | POA: Diagnosis not present

## 2014-04-01 DIAGNOSIS — R2681 Unsteadiness on feet: Secondary | ICD-10-CM | POA: Diagnosis present

## 2014-04-01 DIAGNOSIS — I69928 Other speech and language deficits following unspecified cerebrovascular disease: Secondary | ICD-10-CM | POA: Diagnosis not present

## 2014-04-01 DIAGNOSIS — Z8249 Family history of ischemic heart disease and other diseases of the circulatory system: Secondary | ICD-10-CM

## 2014-04-01 DIAGNOSIS — M6281 Muscle weakness (generalized): Secondary | ICD-10-CM | POA: Diagnosis not present

## 2014-04-01 DIAGNOSIS — I6330 Cerebral infarction due to thrombosis of unspecified cerebral artery: Secondary | ICD-10-CM | POA: Diagnosis not present

## 2014-04-01 DIAGNOSIS — I6350 Cerebral infarction due to unspecified occlusion or stenosis of unspecified cerebral artery: Secondary | ICD-10-CM | POA: Diagnosis present

## 2014-04-01 DIAGNOSIS — I471 Supraventricular tachycardia, unspecified: Secondary | ICD-10-CM | POA: Diagnosis not present

## 2014-04-01 DIAGNOSIS — R296 Repeated falls: Secondary | ICD-10-CM

## 2014-04-01 DIAGNOSIS — I639 Cerebral infarction, unspecified: Secondary | ICD-10-CM | POA: Diagnosis present

## 2014-04-01 DIAGNOSIS — N390 Urinary tract infection, site not specified: Secondary | ICD-10-CM | POA: Diagnosis present

## 2014-04-01 LAB — BASIC METABOLIC PANEL
BUN: 20 mg/dL (ref 6–23)
CALCIUM: 9.2 mg/dL (ref 8.4–10.5)
CO2: 25 meq/L (ref 19–32)
CREATININE: 0.85 mg/dL (ref 0.50–1.35)
Chloride: 101 mEq/L (ref 96–112)
GFR calc Af Amer: 89 mL/min — ABNORMAL LOW (ref >90)
GFR calc non Af Amer: 77 mL/min — ABNORMAL LOW (ref >90)
Glucose, Bld: 108 mg/dL — ABNORMAL HIGH (ref 70–99)
Potassium: 3.9 mEq/L (ref 3.7–5.3)
Sodium: 139 mEq/L (ref 137–147)

## 2014-04-01 LAB — CBC
HEMATOCRIT: 45 % (ref 39.0–52.0)
Hemoglobin: 14.6 g/dL (ref 13.0–17.0)
MCH: 29.4 pg (ref 26.0–34.0)
MCHC: 32.4 g/dL (ref 30.0–36.0)
MCV: 90.7 fL (ref 78.0–100.0)
PLATELETS: 302 10*3/uL (ref 150–400)
RBC: 4.96 MIL/uL (ref 4.22–5.81)
RDW: 14.9 % (ref 11.5–15.5)
WBC: 8.9 10*3/uL (ref 4.0–10.5)

## 2014-04-01 LAB — CK: CK TOTAL: 63 U/L (ref 7–232)

## 2014-04-01 MED ORDER — METOPROLOL SUCCINATE 12.5 MG HALF TABLET
12.5000 mg | ORAL_TABLET | Freq: Every day | ORAL | Status: DC
  Administered 2014-04-02 – 2014-04-10 (×9): 12.5 mg via ORAL
  Filled 2014-04-01 (×10): qty 1

## 2014-04-01 MED ORDER — METOPROLOL SUCCINATE 12.5 MG HALF TABLET
12.5000 mg | ORAL_TABLET | Freq: Every day | ORAL | Status: DC
  Administered 2014-04-01: 12.5 mg via ORAL
  Filled 2014-04-01: qty 1

## 2014-04-01 MED ORDER — DULOXETINE HCL 30 MG PO CPEP
30.0000 mg | ORAL_CAPSULE | Freq: Every day | ORAL | Status: DC
  Administered 2014-04-02 – 2014-04-15 (×14): 30 mg via ORAL
  Filled 2014-04-01 (×15): qty 1

## 2014-04-01 MED ORDER — POLYETHYLENE GLYCOL 3350 17 G PO PACK
17.0000 g | PACK | Freq: Every day | ORAL | Status: DC | PRN
  Filled 2014-04-01: qty 1

## 2014-04-01 MED ORDER — ENOXAPARIN SODIUM 40 MG/0.4ML ~~LOC~~ SOLN: 40.0000 mg | SUBCUTANEOUS | Status: DC

## 2014-04-01 MED ORDER — ONDANSETRON HCL 4 MG/2ML IJ SOLN: 4.0000 mg | Freq: Four times a day (QID) | INTRAMUSCULAR | Status: DC | PRN

## 2014-04-01 MED ORDER — ENOXAPARIN SODIUM 40 MG/0.4ML ~~LOC~~ SOLN
40.0000 mg | SUBCUTANEOUS | Status: DC
  Administered 2014-04-02 – 2014-04-15 (×14): 40 mg via SUBCUTANEOUS
  Filled 2014-04-01 (×14): qty 0.4

## 2014-04-01 MED ORDER — ASPIRIN EC 81 MG PO TBEC
81.0000 mg | DELAYED_RELEASE_TABLET | Freq: Every day | ORAL | Status: DC
  Administered 2014-04-02 – 2014-04-15 (×14): 81 mg via ORAL
  Filled 2014-04-01 (×16): qty 1

## 2014-04-01 MED ORDER — SORBITOL 70 % SOLN
30.0000 mL | Freq: Every day | Status: DC | PRN
  Administered 2014-04-08: 30 mL via ORAL
  Filled 2014-04-01: qty 30

## 2014-04-01 MED ORDER — ONDANSETRON HCL 4 MG PO TABS: 4.0000 mg | ORAL_TABLET | Freq: Four times a day (QID) | ORAL | Status: DC | PRN

## 2014-04-01 MED ORDER — ACETAMINOPHEN 325 MG PO TABS: 325.0000 mg | ORAL_TABLET | ORAL | Status: DC | PRN

## 2014-04-01 NOTE — Discharge Instructions (Addendum)
Ischemic Stroke A stroke (cerebrovascular accident) is the sudden death of brain tissue. It is a medical emergency. A stroke can cause permanent loss of brain function. This can cause problems with different parts of your body. A transient ischemic attack (TIA) is different because it does not cause permanent damage. A TIA is a short-lived problem of poor blood flow affecting a part of the brain. A TIA is also a serious problem because having a TIA greatly increases the chances of having a stroke. When symptoms first develop, you cannot know if the problem might be a stroke or TIA. CAUSES  A stroke is caused by a decrease of oxygen supply to an area of your brain. It is usually the result of a small blood clot or collection of cholesterol or fat (plaque) that blocks blood flow in the brain. A stroke can also be caused by blocked or damaged carotid arteries.  RISK FACTORS  High blood pressure (hypertension).  High cholesterol.  Diabetes mellitus.  Heart disease.  The build up of plaque in the blood vessels (peripheral artery disease or atherosclerosis).  The build up of plaque in the blood vessels providing blood and oxygen to the brain (carotid artery stenosis).  An abnormal heart rhythm (atrial fibrillation).  Obesity.  Smoking.  Taking oral contraceptives (especially in combination with smoking).  Physical inactivity.  A diet high in fats, salt (sodium), and calories.  Alcohol use.  Use of illegal drugs (especially cocaine and methamphetamine).  Being African American.  Being over the age of 56.  Family history of stroke.  Previous history of blood clots, stroke, TIA, or heart attack.  Sickle cell disease. SYMPTOMS  These symptoms usually develop suddenly, or may be newly present upon awakening from sleep:  Sudden weakness or numbness of the face, arm, or leg, especially on one side of the body.  Sudden trouble walking or difficulty moving arms or legs.  Sudden  confusion.  Sudden personality changes.  Trouble speaking (aphasia) or understanding.  Difficulty swallowing.  Sudden trouble seeing in one or both eyes.  Double vision.  Dizziness.  Loss of balance or coordination.  Sudden severe headache with no known cause.  Trouble reading or writing. DIAGNOSIS  Your caregiver can often determine the presence or absence of a stroke based on your symptoms, history, and physical exam. Computed tomography (CT) of the brain is usually performed to confirm the stroke, determine causes, and determine stroke severity. Other tests may be done to find the cause of the stroke. These tests may include:  Electrocardiography.  Continuous heart monitoring.  Echocardiography.  Carotid ultrasonography.  Magnetic resonance imaging (MRI).  A scan of the brain circulation.  Blood tests. PREVENTION  The risk of a stroke can be decreased by appropriately treating high blood pressure, high cholesterol, diabetes, heart disease, and obesity and by quitting smoking, limiting alcohol, and staying physically active. TREATMENT  Time is of the essence. It is important to seek treatment within 3 4 hours of the start of symptoms because you may receive a medicine to dissolve the clot (thrombolytic) that cannot be given after that time. Even if you do not know when your symptoms began, get treatment as soon as possible. After the 4 hour window has passed, treatment may include rest, oxygen, intravenous (IV) fluids, and medicines to thin the blood (anticoagulants). Treatment of stroke depends on the duration, severity, and cause of your symptoms. Medicines and diet may be used to address diabetes, high blood pressure, and other risk  factors. Physical, speech, and occupational therapists will assess you and work to improve any functions impaired by the stroke. Measures will be taken to prevent short-term and long-term complications, including infection from breathing  foreign material into the lungs (aspiration pneumonia), blood clots in the legs, bedsores, and falls. Rarely, surgery may be needed to remove large blood clots or to open up blocked arteries. HOME CARE INSTRUCTIONS   Take all medicines prescribed by your caregiver. Follow the directions carefully. Medicines may be used to control risk factors for a stroke. Be sure you understand all your medicine instructions.  You may be told to take aspirin or the anticoagulant warfarin. Warfarin needs to be taken exactly as instructed.  Too much and too little warfarin are both dangerous. Too much warfarin increases the risk of bleeding. Too little warfarin continues to allow the risk for blood clots. While taking warfarin, you will need to have regular blood tests to measure your blood clotting time. These blood tests usually include both the PT and INR tests. The PT and INR results allow your caregiver to adjust your dose of warfarin. The dose can change for many reasons. It is critically important that you take warfarin exactly as prescribed, and that you have your PT and INR levels drawn exactly as directed.  Many foods, especially foods high in vitamin K can interfere with warfarin and affect the PT and INR results. Foods high in vitamin K include spinach, kale, broccoli, cabbage, collard and turnip greens, brussels sprouts, peas, cauliflower, seaweed, and parsley as well as beef and pork liver, green tea, and soybean oil. You should eat a consistent amount of foods high in vitamin K. Avoid major changes in your diet, or notify your caregiver before changing your diet. Arrange a visit with a dietitian to answer your questions.  Many medicines can interfere with warfarin and affect the PT and INR results. You must tell your caregiver about any and all medicines you take, this includes all vitamins and supplements. Be especially cautious with aspirin and anti-inflammatory medicines. Do not take or discontinue any  prescribed or over-the-counter medicine except on the advice of your caregiver or pharmacist.  Warfarin can have side effects, such as excessive bruising or bleeding. You will need to hold pressure over cuts for longer than usual. Your caregiver or pharmacist will discuss other potential side effects.  Avoid sports or activities that may cause injury or bleeding.  Be mindful when shaving, flossing your teeth, or handling sharp objects.  Alcohol can change the body's ability to handle warfarin. It is best to avoid alcoholic drinks or consume only very small amounts while taking warfarin. Notify your caregiver if you change your alcohol intake.  Notify your dentist or other caregivers before procedures.  If swallow studies have determined that your swallowing reflex is present, you should eat healthy foods. A diet that includes 5 or more servings of fruits and vegetables a day may reduce the risk of stroke. Foods may need to be a special consistency (soft or pureed), or small bites may need to be taken in order to avoid aspirating or choking. Certain diets may be prescribed to address high blood pressure, high cholesterol, diabetes, or obesity.  A low-sodium, low-saturated fat, low-trans fat, low-cholesterol diet is recommended to manage high blood pressure.  A low-saturated fat, low-trans fat, low-cholesterol, and high-fiber diet may control cholesterol levels.  A controlled-carbohydrate, controlled-sugar diet is recommended to manage diabetes.  A reduced-calorie, low-sodium, low-saturated fat, low-trans fat, low-cholesterol diet  is recommended to manage obesity.  Maintain a healthy weight.  Stay physically active. It is recommended that you get at least 30 minutes of activity on most or all days.  Do not smoke.  Limit alcohol use even if you are not taking warfarin. Moderate alcohol use is considered to be:  No more than 2 drinks each day for men.  No more than 1 drink each day for  nonpregnant women.  Stop drug abuse.  Home safety. A safe home environment is important to reduce the risk of falls. Your caregiver may arrange for specialists to evaluate your home. Having grab bars in the bedroom and bathroom is often important. Your caregiver may arrange for equipment to be used at home, such as raised toilets and a seat for the shower.  Physical, occupational, and speech therapy. Ongoing therapy may be needed to maximize your recovery after a stroke. If you have been advised to use a walker or a cane, use it at all times. Be sure to keep your therapy appointments.  Follow all instructions for follow-up with your caregiver. This is very important. This includes any referrals, physical therapy, rehabilitation, and lab tests. Proper follow up can prevent another stroke from occurring. SEEK MEDICAL CARE IF:  You have personality changes.  You have difficulty swallowing.  You are seeing double.  You have dizziness.  You have a fever.  You have skin breakdown. SEEK IMMEDIATE MEDICAL CARE IF:  Any of these symptoms may represent a serious problem that is an emergency. Do not wait to see if the symptoms will go away. Get medical help right away. Call your local emergency services (911 in [place] Do not drive yourself to the hospital.  You have sudden weakness or numbness of the face, arm, or leg, especially on one side of the body.  You have sudden trouble walking or difficulty moving arms or legs.  You have sudden confusion.  You have trouble speaking (aphasia) or understanding.  You have sudden trouble seeing in one or both eyes.  You have a loss of balance or coordination.  You have a sudden, severe headache with no known cause.  You have new chest pain or an irregular heartbeat.  You have a partial or total loss of consciousness.   Document Released: 10/18/2005 Document Revised: 06/20/2013 Document Reviewed: 05/28/2012 [facility] Patient Information 2014  [place].    Fall Prevention and Home Safety Falls cause injuries and can affect all age groups. It is possible to use preventive measures to significantly decrease the likelihood of falls. There are many simple measures which can make your home safer and prevent falls. OUTDOORS  Repair cracks and edges of walkways and driveways.  Remove high doorway thresholds.  Trim shrubbery on the main path into your home.  Have good outside lighting.  Clear walkways of tools, rocks, debris, and clutter.  Check that handrails are not broken and are securely fastened. Both sides of steps should have handrails.  Have leaves, snow, and ice cleared regularly.  Use sand or salt on walkways during winter months.  In the garage, clean up grease or oil spills. BATHROOM  Install night lights.  Install grab bars by the toilet and in the tub and shower.  Use non-skid mats or decals in the tub or shower.  Place a plastic non-slip stool in the shower to sit on, if needed.  Keep floors dry and clean up all water on the floor immediately.  Remove soap buildup in the tub  or shower on a regular basis.  Secure bath mats with non-slip, double-sided rug tape.  Remove throw rugs and tripping hazards from the floors. BEDROOMS  Install night lights.  Make sure a bedside light is easy to reach.  Do not use oversized bedding.  Keep a telephone by your bedside.  Have a firm chair with side arms to use for getting dressed.  Remove throw rugs and tripping hazards from the floor. KITCHEN  Keep handles on pots and pans turned toward the center of the stove. Use back burners when possible.  Clean up spills quickly and allow time for drying.  Avoid walking on wet floors.  Avoid hot utensils and knives.  Position shelves so they are not too high or low.  Place commonly used objects within easy reach.  If necessary, use a sturdy step stool with a grab bar when reaching.  Keep electrical  cables out of the way.  Do not use floor polish or wax that makes floors slippery. If you must use wax, use non-skid floor wax.  Remove throw rugs and tripping hazards from the floor. STAIRWAYS  Never leave objects on stairs.  Place handrails on both sides of stairways and use them. Fix any loose handrails. Make sure handrails on both sides of the stairways are as long as the stairs.  Check carpeting to make sure it is firmly attached along stairs. Make repairs to worn or loose carpet promptly.  Avoid placing throw rugs at the top or bottom of stairways, or properly secure the rug with carpet tape to prevent slippage. Get rid of throw rugs, if possible.  Have an electrician put in a light switch at the top and bottom of the stairs. OTHER FALL PREVENTION TIPS  Wear low-heel or rubber-soled shoes that are supportive and fit well. Wear closed toe shoes.  When using a stepladder, make sure it is fully opened and both spreaders are firmly locked. Do not climb a closed stepladder.  Add color or contrast paint or tape to grab bars and handrails in your home. Place contrasting color strips on first and last steps.  Learn and use mobility aids as needed. Install an electrical emergency response system.  Turn on lights to avoid dark areas. Replace light bulbs that burn out immediately. Get light switches that glow.  Arrange furniture to create clear pathways. Keep furniture in the same place.  Firmly attach carpet with non-skid or double-sided tape.  Eliminate uneven floor surfaces.  Select a carpet pattern that does not visually hide the edge of steps.  Be aware of all pets. OTHER HOME SAFETY TIPS  Set the water temperature for 120 F (48.8 C).  Keep emergency numbers on or near the telephone.  Keep smoke detectors on every level of the home and near sleeping areas. Document Released: 10/08/2002 Document Revised: 04/18/2012 Document Reviewed: 01/07/2012 [facility] Patient  Information 2014 [place].   STROKE/TIA DISCHARGE INSTRUCTIONS SMOKING Cigarette smoking nearly doubles your risk of having a stroke & is the single most alterable risk factor  If you smoke or have smoked in the last 12 months, you are advised to quit smoking for your health.  Most of the excess cardiovascular risk related to smoking disappears within a year of stopping.  Ask you doctor about anti-smoking medications  [place] Quit Line: 1-800-QUIT NOW  Free Smoking Cessation Classes [phone]  CHOLESTEROL Know your levels; limit fat & cholesterol in your diet  Lipid Panel     Component Value Date/Time  CHOL 164 03/30/2014 0540   TRIG 116 03/30/2014 0540   HDL 34* 03/30/2014 0540   CHOLHDL 4.8 03/30/2014 0540   VLDL 23 03/30/2014 0540   LDLCALC 107* 03/30/2014 0540      Many patients benefit from treatment even if their cholesterol is at goal.  Goal: Total Cholesterol (CHOL) less than 160  Goal:  Triglycerides (TRIG) less than 150  Goal:  HDL greater than 40  Goal:  LDL (LDLCALC) less than 100   BLOOD PRESSURE American Stroke Association blood pressure target is less that 120/80 mm/Hg  Your discharge blood pressure is:  BP: 165/45 mmHg  Monitor your blood pressure  Limit your salt and alcohol intake  Many individuals will require more than one medication for high blood pressure  DIABETES (A1c is a blood sugar average for last 3 months) Goal HGBA1c is under 7% (HBGA1c is blood sugar average for last 3 months)  Diabetes: No known diagnosis of diabetes    No results found for this basename: HGBA1C     Your HGBA1c can be lowered with medications, healthy diet, and exercise.  Check your blood sugar as directed by your physician  Call your physician if you experience unexplained or low blood sugars.  PHYSICAL ACTIVITY/REHABILITATION Goal is 30 minutes at least 4 days per week  Activity: No restrictions. Therapies: INPATIENT REHAB ON 4W Return to work: NA  Activity  decreases your risk of heart attack and stroke and makes your heart stronger.  It helps control your weight and blood pressure; helps you relax and can improve your mood.  Participate in a regular exercise program.  Talk with your doctor about the best form of exercise for you (dancing, walking, swimming, cycling).  DIET/WEIGHT Goal is to maintain a healthy weight  Your discharge diet is: Dysphagia THIN liquids MEDICATIONS WHOLE IN APPLESAUCE Your height is:  Height: 5\' 8"  (172.7 cm) Your current weight is: Weight: 92.987 kg (205 lb) Your Body Mass Index (BMI) is:  BMI (Calculated): 30.2  Following the type of diet specifically designed for you will help prevent another stroke.  Your goal weight range is:  125-158  Your goal Body Mass Index (BMI) is 19-24.  Healthy food habits can help reduce 3 risk factors for stroke:  High cholesterol, hypertension, and excess weight.  RESOURCES Stroke/Support Group:  Call [phone]   STROKE EDUCATION PROVIDED/REVIEWED AND GIVEN TO PATIENT Stroke warning signs and symptoms How to activate emergency medical system (call 911). Medications prescribed at discharge. Need for follow-up after discharge. Personal risk factors for stroke. Pneumonia vaccine given: No Flu vaccine given: No My questions have been answered, the writing is legible, and I understand these instructions.  I will adhere to these goals & educational materials that have been provided to me after my discharge from the hospital.    Cardiac Diet This diet can help prevent heart disease and stroke. Many factors influence your heart health, including eating and exercise habits. Coronary risk rises a lot with abnormal blood fat (lipid) levels. Cardiac meal planning includes limiting unhealthy fats, increasing healthy fats, and making other small dietary changes. General guidelines are as follows:  Adjust calorie intake to reach and maintain desirable body weight.  Limit total fat  intake to less than 30% of total calories. Saturated fat should be less than 7% of calories.  Saturated fats are found in animal products and in some vegetable products. Saturated vegetable fats are found in coconut oil, cocoa butter, palm oil, and palm kernel  oil. Read labels carefully to avoid these products as much as possible. Use butter in moderation. Choose tub margarines and oils that have 2 grams of fat or less. Good cooking oils are canola and olive oils.  Practice low-fat cooking techniques. Do not fry food. Instead, broil, bake, boil, steam, grill, roast on a rack, stir-fry, or microwave it. Other fat reducing suggestions include:  Remove the skin from poultry.  Remove all visible fat from meats.  Skim the fat off stews, soups, and gravies before serving them.  Steam vegetables in water or broth instead of sauting them in fat.  Avoid foods with trans fat (or hydrogenated oils), such as commercially fried foods and commercially baked goods. Commercial shortening and deep-frying fats will contain trans fat.  Increase intake of fruits, vegetables, whole grains, and legumes to replace foods high in fat.  Increase consumption of nuts, legumes, and seeds to at least 4 servings weekly. One serving of a legume equals  cup, and 1 serving of nuts or seeds equals  cup.  Choose whole grains more often. Have 3 servings per day (a serving is 1 ounce [oz]).  Eat 4 to 5 servings of vegetables per day. A serving of vegetables is 1 cup of raw leafy vegetables;  cup of raw or cooked cut-up vegetables;  cup of vegetable juice.  Eat 4 to 5 servings of fruit per day. A serving of fruit is 1 medium whole fruit;  cup of dried fruit;  cup of fresh, frozen, or canned fruit;  cup of 100% fruit juice.  Increase your intake of dietary fiber to 20 to 30 grams per day. Insoluble fiber may help lower your risk of heart disease and may help curb your appetite.  Soluble fiber binds cholesterol to be  removed from the blood. Foods high in soluble fiber are dried beans, citrus fruits, oats, apples, bananas, broccoli, Brussels sprouts, and eggplant.  Try to include foods fortified with plant sterols or stanols, such as yogurt, breads, juices, or margarines. Choose several fortified foods to achieve a daily intake of 2 to 3 grams of plant sterols or stanols.  Foods with omega-3 fats can help reduce your risk of heart disease. Aim to have a 3.5 oz portion of fatty fish twice per week, such as salmon, mackerel, albacore tuna, sardines, lake trout, or herring. If you wish to take a fish oil supplement, choose one that contains 1 gram of both DHA and EPA.  Limit processed meats to 2 servings (3 oz portion) weekly.  Limit the sodium in your diet to 1500 milligrams (mg) per day. If you have high blood pressure, talk to a registered dietitian about a DASH (Dietary Approaches to Stop Hypertension) eating plan.  Limit sweets and beverages with added sugar, such as soda, to no more than 5 servings per week. One serving is:   1 tablespoon sugar.  1 tablespoon jelly or jam.   cup sorbet.  1 cup lemonade.   cup regular soda. CHOOSING FOODS Starches  Allowed: Breads: All kinds (wheat, rye, raisin, white, oatmeal, [place], [place], and English muffin bread). Low-fat rolls: English muffins, frankfurter and hamburger buns, bagels, pita bread, tortillas (not fried). Pancakes, waffles, biscuits, and muffins made with recommended oil.  Avoid: Products made with saturated or trans fats, oils, or whole milk products. Butter rolls, cheese breads, croissants. Commercial doughnuts, muffins, sweet rolls, biscuits, waffles, pancakes, store-bought mixes. Crackers  Allowed: Low-fat crackers and snacks: Animal, graham, rye, saltine (with recommended oil, no lard), oyster,  and matzo crackers. Bread sticks, melba toast, rusks, flatbread, pretzels, and light popcorn.  Avoid: High-fat crackers: cheese crackers,  butter crackers, and those made with coconut, palm oil, or trans fat (hydrogenated oils). Buttered popcorn. Cereals  Allowed: Hot or cold whole-grain cereals.  Avoid: Cereals containing coconut, hydrogenated vegetable fat, or animal fat. Potatoes / Pasta / Rice  Allowed: All kinds of potatoes, rice, and pasta (such as macaroni, spaghetti, and noodles).  Avoid: Pasta or rice prepared with cream sauce or high-fat cheese. Chow mein noodles, [place] fries. Vegetables  Allowed: All vegetables and vegetable juices.  Avoid: Fried vegetables. Vegetables in cream, butter, or high-fat cheese sauces. Limit coconut. Fruit in cream or custard. Protein  Allowed: Limit your intake of meat, seafood, and poultry to no more than 6 oz (cooked weight) per day. All lean, well-trimmed beef, veal, pork, and lamb. All chicken and [place] without skin. All fish and shellfish. Wild game: wild duck, rabbit, pheasant, and venison. Egg whites or low-cholesterol egg substitutes may be used as desired. Meatless dishes: recipes with dried beans, peas, lentils, and tofu (soybean curd). Seeds and nuts: all seeds and most nuts.  Avoid: Prime grade and other heavily marbled and fatty meats, such as short ribs, spare ribs, rib eye roast or steak, frankfurters, sausage, bacon, and high-fat luncheon meats, mutton. Caviar. Commercially fried fish. Domestic duck, goose, venison sausage. Organ meats: liver, gizzard, heart, chitterlings, brains, kidney, sweetbreads. Dairy  Allowed: Low-fat cheeses: nonfat or low-fat cottage cheese (1% or 2% fat), cheeses made with part skim milk, such as mozzarella, farmers, string, or ricotta. (Cheeses should be labeled no more than 2 to 6 grams fat per oz.). Skim (or 1%) milk: liquid, powdered, or evaporated. Buttermilk made with low-fat milk. Drinks made with skim or low-fat milk or cocoa. Chocolate milk or cocoa made with skim or low-fat (1%) milk. Nonfat or low-fat yogurt.  Avoid: Whole milk  cheeses, including colby, cheddar, [place], Monterey Jack, [place], [place], [place], American, Swiss, and blue. Creamed cottage cheese, cream cheese. Whole milk and whole milk products, including buttermilk or yogurt made from whole milk, drinks made from whole milk. Condensed milk, evaporated whole milk, and 2% milk. Soups and Combination Foods  Allowed: Low-fat low-sodium soups: broth, dehydrated soups, homemade broth, soups with the fat removed, homemade cream soups made with skim or low-fat milk. Low-fat spaghetti, lasagna, chili, and Spanish rice if low-fat ingredients and low-fat cooking techniques are used.  Avoid: Cream soups made with whole milk, cream, or high-fat cheese. All other soups. Desserts and Sweets  Allowed: Sherbet, fruit ices, gelatins, meringues, and angel food cake. Homemade desserts with recommended fats, oils, and milk products. Jam, jelly, honey, marmalade, sugars, and syrups. Pure sugar candy, such as gum drops, hard candy, jelly beans, marshmallows, mints, and small amounts of dark chocolate.  Avoid: Commercially prepared cakes, pies, cookies, frosting, pudding, or mixes for these products. Desserts containing whole milk products, chocolate, coconut, lard, palm oil, or palm kernel oil. Ice cream or ice cream drinks. Candy that contains chocolate, coconut, butter, hydrogenated fat, or unknown ingredients. Buttered syrups. Fats and Oils  Allowed: Vegetable oils: safflower, sunflower, corn, soybean, cottonseed, sesame, canola, olive, or peanut. Non-hydrogenated margarines. Salad dressing or mayonnaise: homemade or commercial, made with a recommended oil. Low or nonfat salad dressing or mayonnaise.  Limit added fats and oils to 6 to 8 tsp per day (includes fats used in cooking, baking, salads, and spreads on bread). Remember to count the "hidden fats" in foods.  Avoid: Solid fats and shortenings: butter, lard, salt pork, bacon drippings. Gravy containing meat fat,  shortening, or suet. Cocoa butter, coconut. Coconut oil, palm oil, palm kernel oil, or hydrogenated oils: these ingredients are often used in bakery products, nondairy creamers, whipped toppings, candy, and commercially fried foods. Read labels carefully. Salad dressings made of unknown oils, sour cream, or cheese, such as blue cheese and Roquefort. Cream, all kinds: half-and-half, light, heavy, or whipping. Sour cream or cream cheese (even if "light" or low-fat). Nondairy cream substitutes: coffee creamers and sour cream substitutes made with palm, palm kernel, hydrogenated oils, or coconut oil. Beverages  Allowed: Coffee (regular or decaffeinated), tea. Diet carbonated beverages, mineral water. Alcohol: Check with your caregiver. Moderation is recommended.  Avoid: Whole milk, regular sodas, and juice drinks with added sugar. Condiments  Allowed: All seasonings and condiments. Cocoa powder. "Cream" sauces made with recommended ingredients.  Avoid: Carob powder made with hydrogenated fats. SAMPLE MENU Breakfast   cup orange juice   cup oatmeal  1 slice toast  1 tsp margarine  1 cup skim milk Lunch  [place] sandwich with 2 oz [place], 2 slices bread  Lettuce and tomato slices  Fresh fruit  Carrot sticks  Coffee or tea Snack  Fresh fruit or low-fat crackers Dinner  3 oz lean ground beef  1 baked potato  1 tsp margarine   cup asparagus  Lettuce salad  1 tbs non-creamy dressing   cup peach slices  1 cup skim milk Document Released: 07/27/2008 Document Revised: 04/18/2012 Document Reviewed: 01/11/2012 Exit[employer] Patient Information 2014 [place], [place].

## 2014-04-01 NOTE — Discharge Summary (Signed)
DISCHARGE SUMMARY  Carl Rios  MR#: [id number]  DOB:09-Jan-1926  Date of Admission: 03/26/2014 Date of Discharge: 04/01/2014  Attending [place]  Patient's [id number], Gwyndolyn Saxon, MD  Consults:   PT/OT/ST Inpatient Rehab  Discharge Diagnoses: Principal Problem:   Rhabdomyolysis Active Problems:   Unspecified hereditary and idiopathic peripheral neuropathy   Acute renal failure   Dehydration   CVA (cerebral infarction)   Recurrent falls   Paroxysmal SVT (supraventricular tachycardia)   Unsteady gait  Past Medical History  Diagnosis Date  . AAA (abdominal aortic aneurysm) without rupture   . Hyperlipidemia   . Diverticulosis   . GERD (gastroesophageal reflux disease)   . Hx of adenomatous colonic polyps   . Peripheral vascular disease   . Obesity   . Dyslipidemia   . History of colon polyps   . History of prostatitis   . Hearing difficulty   . Peripheral neuropathy    Past Surgical History  Procedure Laterality Date  . Knee surgery      left knee in 1970's  . Colonoscopy  01/24/2012    Procedure: COLONOSCOPY;  Surgeon: Lafayette Dragon, MD;  Location: [facility];  Service: Endoscopy;  Laterality: N/A;  . Inguinal hernia repair    . Cataract extraction Bilateral   . Tonsillectomy    . Abdominal aortic endovascular stent graft N/A 08/23/2013    Procedure: ABDOMINAL AORTIC ENDOVASCULAR STENT GRAFT;  Surgeon: Mal Misty, MD;  Location: [facility] OR;  Service: Vascular;  Laterality: N/A;  . Abdominal aortic aneurysm repair      Discharge Medications:   Medication List         aspirin 81 MG EC tablet  Take 1 tablet (81 mg total) by mouth daily.     DULoxetine 30 MG capsule  Commonly known as:  CYMBALTA  Take 1 capsule (30 mg total) by mouth daily.     HYDROcodone-acetaminophen 5-325 MG per tablet  Commonly known as:  NORCO/VICODIN  Take 1-2 tablets by mouth every 4 (four) hours as needed for moderate pain.     OVER THE COUNTER MEDICATION  Take 1  tablet by mouth 2 (two) times daily. OTC supplement "Macular Protect Plus"       Toprol XL 12.45m daily    Hospital Procedures: Dg Thoracic Spine 2 View 03/26/2014   CLINICAL DATA:  Status post fall; upper back pain.  EXAM: THORACIC SPINE - 2 VIEW  COMPARISON:  Chest radiograph performed 08/21/2013  FINDINGS: There is no evidence of fracture or subluxation. Vertebral bodies demonstrate normal height and alignment. Intervertebral disc spaces are preserved.  The visualized portions of both lungs are clear. The mediastinum is unremarkable in appearance.  IMPRESSION: No evidence of fracture or subluxation along the thoracic spine.   Electronically Signed   By: JGarald BaldingM.D.   On: 03/26/2014 23:29   Dg Lumbar Spine Complete 03/26/2014   CLINICAL DATA:  Status post fall.  Lower back pain.  EXAM: LUMBAR SPINE - COMPLETE 4+ VIEW  COMPARISON:  CT of the abdomen and pelvis performed 09/25/2013  FINDINGS: There is no evidence of fracture or subluxation. Vertebral bodies demonstrate normal height and alignment. Intervertebral disc spaces are preserved. The visualized neural foramina are grossly unremarkable in appearance.  The visualized bowel gas pattern is unremarkable in appearance; air and stool are noted within the colon. The sacroiliac joints are within normal limits. An aortoiliac stent graft is noted.  IMPRESSION: No evidence of fracture or subluxation along the lumbar spine.  Electronically Signed   By: Garald Balding M.D.   On: 03/26/2014 23:31   Dg Pelvis 1-2 Views 03/26/2014   CLINICAL DATA:  Status post fall; concern for pelvic injury.  EXAM: PELVIS - 1-2 VIEW  COMPARISON:  CTA of the abdomen and pelvis performed 09/25/2013  FINDINGS: There is no evidence of fracture or dislocation. Both femoral heads are seated normally within their respective acetabula. No significant degenerative change is appreciated. The sacroiliac joints are unremarkable in appearance.  The visualized bowel gas pattern is  grossly unremarkable in appearance. The patient's aortoiliac stent graft is partially imaged.  IMPRESSION: No evidence of fracture or dislocation.   Electronically Signed   By: Garald Balding M.D.   On: 03/26/2014 23:33   Ct Head Wo Contrast 03/26/2014   CLINICAL DATA:  Status post fall.  Dizziness and weakness.  EXAM: CT HEAD WITHOUT CONTRAST  TECHNIQUE: Contiguous axial images were obtained from the base of the skull through the vertex without intravenous contrast.  COMPARISON:  None.  FINDINGS: There is no evidence of acute infarction, mass lesion, or intra- or extra-axial hemorrhage on CT.  Prominence of the ventricles and sulci reflects moderate cortical volume loss. Diffuse periventricular and subcortical white matter change likely reflects small vessel ischemic microangiopathy. Cerebellar atrophy is noted. A chronic lacunar infarct is noted at the right thalamus.  The brainstem and fourth ventricle are within normal limits. The cerebral hemispheres demonstrate grossly normal gray-white differentiation. No mass effect or midline shift is seen.  There is no evidence of fracture; visualized osseous structures are unremarkable in appearance. The orbits are within normal limits. The paranasal sinuses and mastoid air cells are well-aerated. No significant soft tissue abnormalities are seen.  IMPRESSION: 1. No evidence of traumatic intracranial injury or fracture. 2. Moderate cortical volume loss and diffuse small vessel ischemic microangiopathy. 3. Chronic lacunar infarct at the right thalamus.   Electronically Signed   By: Garald Balding M.D.   On: 03/26/2014 23:29   Mr Jodene Nam Head Wo Contrast 03/29/2014   CLINICAL DATA:  Gait instability.  Fall.  Rule out CVA.  EXAM: MRI HEAD WITHOUT AND WITH CONTRAST  MRA HEAD WITHOUT CONTRAST  TECHNIQUE: Multiplanar, multiecho pulse sequences of the brain and surrounding structures were obtained without and with intravenous contrast. Angiographic images of the head were  obtained using MRA technique without contrast.  CONTRAST:  52m MULTIHANCE GADOBENATE DIMEGLUMINE 529 MG/ML IV SOLN  COMPARISON:  CT head 03/26/2012  FINDINGS: MRI HEAD FINDINGS  Acute right PCA infarct. Acute infarct in the right lateral thalamus and in the right occipital lobe. No other areas of acute infarct.  Moderate atrophy. Extensive chronic microvascular ischemic changes throughout the cerebral white matter and pons. Chronic infarct left thalamus. Ventricular enlargement consistent with atrophy.  Negative for intracranial hemorrhage. Negative for mass or edema. Normal enhancement following contrast administration.  Paranasal sinuses are clear.  MRA HEAD FINDINGS  Both vertebral arteries are patent to the basilar. The basilar is widely patent. Superior cerebellar arteries are patent bilaterally. Posterior cerebral arteries are patent bilaterally with a moderate stenosis in the left mid PCA. Right PCA widely patent.  Mild atherosclerotic disease in the cavernous carotid bilaterally. Mild to moderate atherosclerotic disease involving the middle cerebral artery branches bilaterally. Mild left M1 segment stenosis. Moderate disease in the right anterior cerebral artery. Left anterior cerebral artery widely patent.  Negative for cerebral aneurysm.  IMPRESSION: Acute infarct right posterior cerebral artery territory.  Atrophy and advanced chronic microvascular ischemia.  Moderate intracranial atherosclerotic disease. Of note, the right posterior cerebral artery is widely patent.   Electronically Signed   By: Franchot Gallo M.D.   On: 03/29/2014 11:19   Mr Jeri Cos ZO Contrast 03/29/2014   CLINICAL DATA:  Gait instability.  Fall.  Rule out CVA.  EXAM: MRI HEAD WITHOUT AND WITH CONTRAST  MRA HEAD WITHOUT CONTRAST  TECHNIQUE: Multiplanar, multiecho pulse sequences of the brain and surrounding structures were obtained without and with intravenous contrast. Angiographic images of the head were obtained using MRA  technique without contrast.  CONTRAST:  24m MULTIHANCE GADOBENATE DIMEGLUMINE 529 MG/ML IV SOLN  COMPARISON:  CT head 03/26/2012  FINDINGS: MRI HEAD FINDINGS  Acute right PCA infarct. Acute infarct in the right lateral thalamus and in the right occipital lobe. No other areas of acute infarct.  Moderate atrophy. Extensive chronic microvascular ischemic changes throughout the cerebral white matter and pons. Chronic infarct left thalamus. Ventricular enlargement consistent with atrophy.  Negative for intracranial hemorrhage. Negative for mass or edema. Normal enhancement following contrast administration.  Paranasal sinuses are clear.  MRA HEAD FINDINGS  Both vertebral arteries are patent to the basilar. The basilar is widely patent. Superior cerebellar arteries are patent bilaterally. Posterior cerebral arteries are patent bilaterally with a moderate stenosis in the left mid PCA. Right PCA widely patent.  Mild atherosclerotic disease in the cavernous carotid bilaterally. Mild to moderate atherosclerotic disease involving the middle cerebral artery branches bilaterally. Mild left M1 segment stenosis. Moderate disease in the right anterior cerebral artery. Left anterior cerebral artery widely patent.  Negative for cerebral aneurysm.  IMPRESSION: Acute infarct right posterior cerebral artery territory.  Atrophy and advanced chronic microvascular ischemia.  Moderate intracranial atherosclerotic disease. Of note, the right posterior cerebral artery is widely patent.   Electronically Signed   By: CFranchot GalloM.D.   On: 03/29/2014 11:19   Echocardiogram (03/30/14) Study Conclusions - Left ventricle: The cavity size was normal. Wall thickness was normal. Systolic function was normal. The estimated ejection fraction was in the range of 55% to 60%. Doppler parameters are consistent with abnormal left ventricular relaxation (grade 1 diastolic dysfunction). - Mitral valve: There was mild regurgitation.  Carotid  Dopplers (03/31/14) Preliminary report: 1-39% ICA stenosis. Vertebral artery flow is antegrade.   History of Present Illness: Carl KAUTZMANis an 80y.o. male. Pt w/ known hx of peripheral neuropathy and pt of Dr SBrigitte Pulsepresented after falling from "hitting a wall" early this AM and was down for over 12 hours b/f family was able to get him up. After being down, he was unable to support himself and required great assistance to get to ED. In ED, he was found to be tachycardic, w/ elevated BUN/Cr ration and CK > 1500. His MM were dry and he showed signs of dehydration. He does not have AKI at this time . He will be admitted for rehydration in setting of elevated CK and consultation to SW and PT/OT to reeval his independant living situation.    Hospital Course: Carl Gullingwas admitted to a telemetry bed for management of his rhabdomyolysis and acute renal insufficiency in the setting of his prolonged time on the ground after a fall..Marland KitchenHe was hydrated with IV fluids with rapid improvement in his CK levels and a gradual improvement in his BUN consistent with resolving prerenal azotemia. He had significant increase in creatinine level to indicate significant renal failure. He continued to have some soreness and generalized weakness which has gradually improved.  He was evaluated by physical therapy and initially ambulated well with a walker. However, on hospital day 2 he was noted to have increasing gait instability with a tendency to lean to the left. He also had intermittent dysarthria and possible mild left-sided weakness. Since his head CT was negative on admission an MRI was obtained that showed an acute right PCA, right lateral thalamus, and right occipital lobe infarcts and a chronic left thalamus infarct.  He was started on aspirin at the time of his noted neurologic changes. MRA showed no significant stenosis.  An echocardiogram was performed and showed no source of cardiac embolus and normal ejection fraction.  Carotid Doppler showed no significant stenosis. He did have an isolated episode of SVT that was interpreted as possible A. Fib; however, upon my review this appears to be SVT. EKG was ordered but he converted back to sinus rhythm before to be completed. His baseline rhythm appears to be sinus rhythm with frequent PVCs, often in a bigeminy pattern. Because of his SVT and elevated blood pressure he will be started on a low dose of Toprol. He is a poor anticoagulation candidate due to his recurrent falls at home and has unsteady gait-after completing rehabilitation we may need to reconsider anticoagulation and may want to consider Holter monitor. We did discuss the risk of stroke if he does have paroxysmal atrial fibrillation but at this time the risk of anticoagulation exceeds the benefit with his high fall risk.  In terms of other risk factors, His A1c has been normal in the office recently and is followed regularly. His LDL was 107. We will plan to start a statin in several weeks once his rhabdo has completely resolved. At this time, the patient is doing well with physical therapy. He is ambulating with assistance. Unsteadiness is is persistent but seems to be a little better. His weakness has improved though still worse than baseline. He has been evaluated by speech therapy who recommended a dysphagia 3 diet. He is an excellent candidate for inpatient rehabilitation to continue focusing on balance, muscle strengthening, and fall prevention in the setting of his recent rhabdomyolysis and strokes.  Day of Discharge Exam BP 136/53  Pulse 51  Temp(Src) 97.3 F (36.3 C) (Oral)  Resp 18  Ht 5' 8"  (1.727 m)  Wt 92.987 kg (205 lb)  BMI 31.18 kg/m2  SpO2 94%  Physical Exam: General appearance: alert and no distress Eyes: no scleral icterus Throat: oropharynx moist without erythema Resp: clear to auscultation bilaterally Cardio: regular rate and rhythm and With frequent ectopy GI: soft, non-tender; bowel  sounds normal; no masses,  no organomegaly Extremities: no clubbing, cyanosis or edema Neurologic: Strength is normal in all extremities; slight ataxia of left finger to nose  Discharge Labs:  Recent Labs  03/31/14 0612 04/01/14 0545  NA 138 139  K 3.9 3.9  CL 102 101  CO2 22 25  GLUCOSE 105* 108*  BUN 15 20  CREATININE 0.82 0.85  CALCIUM 8.9 9.2   Hepatic Function Latest Ref Rng 03/27/2014  Total Protein 6.0 - 8.3 g/dL 6.7  Albumin 3.5 - 5.2 g/dL 3.6  AST 0 - 37 U/L 38(H)  ALT 0 - 53 U/L 22  Alk Phosphatase 39 - 117 U/L 84  Total Bilirubin 0.3 - 1.2 mg/dL 1.2    CBC Latest Ref Rng 04/01/2014 03/31/2014 03/30/2014  WBC 4.0 - 10.5 K/uL 8.9 7.6 7.2  Hemoglobin 13.0 - 17.0 g/dL 14.6 14.5 14.4  Hematocrit 39.0 - 52.0 %  45.0 42.8 42.9  Platelets 150 - 400 K/uL 302 259 256    Recent Labs  03/30/14 0540 03/31/14 0612 04/01/14 0545  CKTOTAL 168 93 63   CK on admission 1026 (5/26)  Total cholesterol 164 Triglycerides 116 HDL 34 LDL 107  Discharge instructions: Continue physical therapy, occupational baby, and speech therapy  Disposition: To acute rehabilitation-anticipate [facility] inpatient rehabilitation pending bed availability  Follow-up Appts: Follow-up with Dr. Brigitte Pulse at [facility] one week after discharge from rehabilitation. Call within 48 hours of discharge  Condition on Discharge: Stable  Tests Needing Follow-up: None  Discharge diet: Dysphagia 3  Time with discharge activities: 45 minutes  Signed: Marton Redwood 04/01/2014, 8:34 AM

## 2014-04-01 NOTE — Progress Notes (Signed)
PMR Admission Coordinator Pre-Admission Assessment  Patient: Carl Rios is an 80 y.o., male  MRN: [id number]  DOB: [birth year]  Height: 5\' 8"  (172.7 cm)  Weight: 92.987 kg (205 lb)  Insurance Information  HMO: PPO: PCP: IPA: 80/20: OTHER:  PRIMARY: Medicare A/B Policy#: [id number] Subscriber: Carl Rios  CM Name: Phone#: Fax#:  Pre-Cert#: Employer: Retired  Benefits: Phone #: Name: Checked in [place]. Date: 11/01/92 Deduct: $1260 Out of Pocket Max: none Life Max: unlimited  CIR: 100% SNF: 100 days  Outpatient: 80% Co-Pay: 20%  Home Health: 100% Co-Pay: none  DME: 80% Co-Pay: 20%  Providers: patient's choice   SECONDARY: BCBS of [place] sup Policy#: [id number] Subscriber: Carl Rios  CM Name: Phone#: Fax#:  Pre-Cert#: Employer: Retired  Benefits: Phone #: [phone] Name:  Eff. Date: Deduct: Out of Pocket Max: Life Max:  CIR: SNF:  Outpatient: Co-Pay:  Home Health: Co-Pay:  DME: Co-Pay:  Emergency Contact Information  Contact Information    Name  Relation  Home  Work  Mobile    Rios,Carl  Son  [phone]      Carl Rios, Carl Rios    [phone]    Carl Rios,Carl Rios   [phone]        Current Medical History  Patient Admitting Diagnosis:Right PCA infarct, rhabdomyolysis  History of Present Illness: A 79 y.o. right-handed male history of peripheral neuropathy. Presented 03/26/2014 after a fall and remained down for approximately 12 hours until found by family. Patient independent prior to admission living alone and driving. Noted total CK of 1550, troponin negative, white blood cell count 14,100 and BUN 27. Cranial CT scan with no evidence of intracranial abnormality noted chronic lacunar infarct at the right thalamus. Patient did not receive TPA. The thoracic and lumbar spines negative for acute changes. Pelvis films without fracture. MRI of the brain revealed an acute right PCA infarct. Carotid Dopplers with no ICA stenosis. Echocardiogram with ejection fraction of  93% grade 1 diastolic dysfunction. Maintained on aspirin for CVA prophylaxis. Patient was placed on gentle IV fluids with latest BUN and creatinine within normal limits. Total CK again within normal limits at 63. Subcutaneous Lovenox added for DVT prophylaxis. He is tolerating a mechanical soft diet. Physical occupational therapy evaluations completed. Patient requiring Max assist to prevent falls in getting back to bed with loss of balance. Recommendations by physical and occupational therapy are made for physical medicine rehabilitation consult. Patient to be admitted for comprehensive inpatient rehabilitation program.  Total: 4=NIH  Past Medical History  Past Medical History   Diagnosis  Date   .  AAA (abdominal aortic aneurysm) without rupture    .  Hyperlipidemia    .  Diverticulosis    .  GERD (gastroesophageal reflux disease)    .  Hx of adenomatous colonic polyps    .  Peripheral vascular disease    .  Obesity    .  Dyslipidemia    .  History of colon polyps    .  History of prostatitis    .  Hearing difficulty    .  Peripheral neuropathy     Family History  family history includes Heart disease (age of onset: 9) in his father.  Prior Rehab/Hospitalizations: Had outpatient [facility].  Current Medications  Current facility-administered medications:alum & mag hydroxide-simeth (MAALOX/MYLANTA) 200-200-20 MG/5ML suspension 30 mL, 30 mL, Oral, Q6H PRN, Velna Hatchet, MD, 30 mL at 03/30/14 1257; aspirin EC tablet 81 mg, 81 mg, Oral,  Daily, Marton Redwood, MD, 81 mg at 04/01/14 1034; DULoxetine (CYMBALTA) DR capsule 30 mg, 30 mg, Oral, Daily, Velna Hatchet, MD, 30 mg at 04/01/14 1035  enoxaparin (LOVENOX) injection 40 mg, 40 mg, Subcutaneous, Q24H, Marton Redwood, MD, 40 mg at 04/01/14 1037; metoprolol succinate (TOPROL-XL) 24 hr tablet 12.5 mg, 12.5 mg, Oral, Daily, Marton Redwood, MD, 12.5 mg at 04/01/14 1036; ondansetron (ZOFRAN) injection 4 mg, 4 mg,  Intravenous, Q6H PRN, Velna Hatchet, MD; ondansetron (ZOFRAN) tablet 4 mg, 4 mg, Oral, Q6H PRN, Velna Hatchet, MD  polyethylene glycol (MIRALAX / GLYCOLAX) packet 17 g, 17 g, Oral, Daily PRN, Velna Hatchet, MD, 17 g at 03/30/14 1000  Patients Current Diet: Dysphagia  Precautions / Restrictions  Precautions  Precautions: Fall  Precaution Comments: HOH  Restrictions  Weight Bearing Restrictions: No  Prior Activity Level  Community (5-7x/wk): Went out 3-4 X a week  Development worker, international aid / [place] Devices/Equipment: Radio producer (specify quad or straight)  Home Equipment: Cane - single point  Prior Functional Level  Prior Function  Level of Independence: Independent  Comments: drove  Current Functional Level  Cognition  Overall Cognitive Status: Impaired/Different from baseline  Orientation Level: Oriented X4  Safety/Judgement: Decreased awareness of safety;Decreased awareness of deficits  General Comments: Pt with significant posterior left lean particularly in sitting initially unaware. Pt also unaware of incontinent of urine. pt repeatedly instructed for need of assist with mobility but unable to recall after 5x education   Extremity Assessment  (includes Sensation/Coordination)  Lower Extremity Assessment: Overall WFL for tasks assessed (pt reports problems in past with left knee but tested [facility])  Cervical / Trunk Assessment: Normal   ADLs  Overall ADL's : Needs assistance/impaired  Eating/Feeding: Set up  Grooming: Wash/dry face;Min guard;Standing  Upper Body Bathing: Min guard;Sitting  Lower Body Bathing: Moderate assistance;Sit to/from stand  Upper Body Dressing : Sitting;Set up;Supervision/safety  Lower Body Dressing: Sitting/lateral leans;Minimal assistance (socks)  Toilet Transfer: Minimal assistance;Ambulation;Regular Toilet;RW;Grab bars  Toileting- Clothing Manipulation and Hygiene: Minimal assistance;Sit to/from stand  Functional mobility during ADLs: Minimal  assistance;Min guard;Rolling walker  General ADL Comments: Pt soiled gown due to catheter not being secured well. Performed LB bathing at sink as well as donned/doffed socks. Pt also ambulated in hallway. Min A for sidestepping with walker and also with turns in hallway. Pt fatigued during session and educated on deep breathing technique-took break.   Mobility  Overal bed mobility: Needs Assistance  Bed Mobility: Rolling;Sidelying to Sit  Rolling: Min guard  Sidelying to sit: Min assist  Supine to sit: Mod assist  Sit to supine: Mod assist  General bed mobility comments: cues for log roll technique as pt c/o back pain.   Transfers  Overall transfer level: Needs assistance  Equipment used: Rolling walker (2 wheeled)  Transfers: Sit to/from Stand  Sit to Stand: Min assist;Min guard  Stand pivot transfers: Mod assist  General transfer comment: Min A from low chair. Cues for hand placement/technique.   Ambulation / Gait / Stairs / Wheelchair Mobility  Ambulation/Gait  Ambulation/Gait assistance: Fish farm manager (Feet): 100 Feet  Assistive device: Rolling walker (2 wheeled)  Gait Pattern/deviations: Step-through pattern;Decreased stride length  Gait velocity interpretation: <1.8 ft/sec, indicative of risk for recurrent falls  General Gait Details: cues for position in Rw, pt with tendency to let left hand drift off walker, pulling RW toward right   Posture / Balance  Dynamic Sitting Balance  Sitting balance - Comments: pt initially unaware  of deficit with visual assist of lining his posture up with P.T. EOB 9 min EOB with multimodal cues to achieve midline   Special needs/care consideration  BiPAP/CPAP No  CPM No  Continuous Drip IV No  Dialysis No  Life Vest No  Oxygen No  Special Bed No  Trach Size No  Wound Vac (area) No  Skin Has some healing abrasions on left shin and knee  Bowel mgmt: Last BM 04/01/14  Bladder mgmt: Using condom cath and urinal with incontinence   Diabetic mgmt No   Previous Home Environment  Living Arrangements: Alone  Available Help at Discharge: Family  Type of Home: House  Home Layout: One level  Home Access: Stairs to enter  Technical brewer of Steps: 1  Bathroom Shower/Tub: Tourist information centre manager: Handicapped height  Bathroom Accessibility: No  Home Care Services: No  Additional Comments: son has rolling walker at home and to find it prior to pt gong home  Discharge Living Setting  Plans for Discharge Living Setting: Patient's home;Alone;House (1st floor condominium.)  Type of Home at Discharge: House (Lives in a condominium.)  Discharge Home Layout: One level  Discharge Home Access: Level entry  Does the patient have any problems obtaining your medications?: No  Social/Family/Support Systems  Patient Roles: Parent (Widower X 4 months. Has 2 sons and 1 daughter.)  Contact Information: Carl Rios - son [phone]  Anticipated Caregiver: Son lives close by and works. 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  Pt/Family Agrees to Admission and willing to participate: Yes  Program Orientation Provided & Reviewed with Pt/Caregiver Including Roles & Responsibilities: Yes  Decrease burden of Care through IP rehab admission: N/A  Possible need for SNF placement upon discharge: Yes, if patient is not able to arrange any  aftercare at the time of discharge.  Patient Condition: This patient's medical and functional status has changed since the consult dated: 03/29/14 in which the Rehabilitation Physician determined and documented that the patient's condition is appropriate for intensive rehabilitative care in an inpatient rehabilitation facility. See "History of Present Illness" (above) for medical update. Functional changes are: Currently requiring min assist to ambulate 100 ft with 2 wheeled RW. Patient's medical and functional status update has been discussed with the Rehabilitation physician and patient remains appropriate for inpatient rehabilitation. Will admit to inpatient rehab today.  Preadmission Screen Completed By: [name], 04/01/2014 1:29 PM  ______________________________________________________________________  Discussed status with Dr. Naaman Plummer on 04/01/14 at 1356 and received telephone approval for admission today.  Admission Coordinator: [name], time1356/Date06/01/15  Cosigned by: Meredith Staggers, MD [04/01/2014 2:44 PM]

## 2014-04-01 NOTE — Progress Notes (Signed)
1044 04-01-14 l Medicare IM signed by patient and signed copy left on chart. Ocie Cornfield Morley, RN,BSN [phone]    '

## 2014-04-01 NOTE — PMR Pre-admission (Signed)
PMR Admission Coordinator Pre-Admission Assessment  Patient: Carl Rios is an 80 y.o., male MRN: [id number] DOB: 1926/06/20 Height: 5\' 8"  (172.7 cm) Weight: 92.987 kg (205 lb)              Insurance Information HMO:      PPO:       PCP:       IPA:       80/20:       OTHER:   PRIMARY: Medicare A/B      Policy#: [id number]      Subscriber: Lorenso Courier CM Name:        Phone#:       Fax#:   Pre-Cert#:        Employer: Retired Benefits:  Phone #:       Name: Checked in [place]. Date: 11/01/92     Deduct: $1260      Out of Pocket Max: none      Life Max: unlimited CIR: 100%      SNF: 100 days Outpatient: 80%     Co-Pay: 20% Home Health: 100%      Co-Pay: none DME: 80%     Co-Pay: 20% Providers: patient's choice  SECONDARY: BCBS of [place] sup      Policy#: [id number]      Subscriber: Lorenso Courier CM Name:        Phone#:       Fax#:   Pre-Cert#:        Employer: Retired Benefits:  Phone #:  [phone]     Name:   Eff. Date:       Deduct:        Out of Pocket Max:        Life Max:   CIR:        SNF:   Outpatient:       Co-Pay:   Home Health:        Co-Pay:   DME:       Co-Pay:    Emergency Contact Information Contact Information   Name Relation Home Work Mobile   Carl Rios Son [phone]     Rios, Carl   [phone]   Rios,Carl  [phone]       Current Medical History  Patient Admitting Diagnosis:Right PCA infarct, rhabdomyolysis   History of Present Illness: A 80 y.o. right-handed male history of peripheral neuropathy. Presented 03/26/2014 after a fall and remained down for approximately 12 hours until found by family. Patient independent prior to admission living alone and driving. Noted total CK of 1550, troponin negative, white blood cell count 14,100 and BUN 27. Cranial CT scan with no evidence of intracranial abnormality noted chronic lacunar infarct at the right thalamus. Patient did not receive TPA. The thoracic and lumbar spines negative for acute  changes. Pelvis films without fracture. MRI of the brain revealed an acute right PCA infarct. Carotid Dopplers with no ICA stenosis. Echocardiogram with ejection fraction of 25% grade 1 diastolic dysfunction. Maintained on aspirin for CVA prophylaxis. Patient was placed on gentle IV fluids with latest BUN and creatinine within normal limits. Total CK again within normal limits at 63. Subcutaneous Lovenox added for DVT prophylaxis. He is tolerating a mechanical soft diet. Physical occupational therapy evaluations completed. Patient requiring Max assist to prevent falls in getting back to bed with loss of balance. Recommendations by physical and occupational therapy are made for physical medicine rehabilitation consult. Patient to be admitted for comprehensive inpatient rehabilitation program.    Total: 4=NIH  Past Medical History  Past Medical History  Diagnosis Date  . AAA (abdominal aortic aneurysm) without rupture   . Hyperlipidemia   . Diverticulosis   . GERD (gastroesophageal reflux disease)   . Hx of adenomatous colonic polyps   . Peripheral vascular disease   . Obesity   . Dyslipidemia   . History of colon polyps   . History of prostatitis   . Hearing difficulty   . Peripheral neuropathy     Family History  family history includes Heart disease (age of onset: 75) in his father.  Prior Rehab/Hospitalizations:  Had outpatient [facility] center.   Current Medications  Current facility-administered medications:alum & mag hydroxide-simeth (MAALOX/MYLANTA) 200-200-20 MG/5ML suspension 30 mL, 30 mL, Oral, Q6H PRN, Velna Hatchet, MD, 30 mL at 03/30/14 1257;  aspirin EC tablet 81 mg, 81 mg, Oral, Daily, Marton Redwood, MD, 81 mg at 04/01/14 1034;  DULoxetine (CYMBALTA) DR capsule 30 mg, 30 mg, Oral, Daily, Velna Hatchet, MD, 30 mg at 04/01/14 1035 enoxaparin (LOVENOX) injection 40 mg, 40 mg, Subcutaneous, Q24H, Marton Redwood, MD, 40 mg at 04/01/14 1037;  metoprolol  succinate (TOPROL-XL) 24 hr tablet 12.5 mg, 12.5 mg, Oral, Daily, Marton Redwood, MD, 12.5 mg at 04/01/14 1036;  ondansetron (ZOFRAN) injection 4 mg, 4 mg, Intravenous, Q6H PRN, Velna Hatchet, MD;  ondansetron (ZOFRAN) tablet 4 mg, 4 mg, Oral, Q6H PRN, Velna Hatchet, MD polyethylene glycol (MIRALAX / GLYCOLAX) packet 17 g, 17 g, Oral, Daily PRN, Velna Hatchet, MD, 17 g at 03/30/14 1000  Patients Current Diet: Dysphagia  Precautions / Restrictions Precautions Precautions: Fall Precaution Comments: HOH Restrictions Weight Bearing Restrictions: No   Prior Activity Level Community (5-7x/wk): Went out 3-4 X a week  Development worker, 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 Pt also unaware of incontinent of urine. pt repeatedly instructed for need of assist with mobility but unable to recall after 5x education    Extremity Assessment (includes Sensation/Coordination)  Lower Extremity Assessment: Overall WFL for tasks assessed (pt reports problems in past with left knee but tested [facility])  Cervical / Trunk Assessment: Normal    ADLs  Overall ADL's : Needs assistance/impaired Eating/Feeding: Set up Grooming: Wash/dry face;Min guard;Standing Upper Body Bathing: Min guard;Sitting Lower Body Bathing: Moderate assistance;Sit to/from stand Upper Body Dressing : Sitting;Set up;Supervision/safety Lower Body Dressing: Sitting/lateral leans;Minimal assistance (socks) Toilet Transfer: Minimal assistance;Ambulation;Regular  Toilet;RW;Grab bars Toileting- Clothing Manipulation and Hygiene: Minimal assistance;Sit to/from stand Functional mobility during ADLs: Minimal assistance;Min guard;Rolling walker General ADL Comments: Pt soiled gown due to catheter not being secured well. Performed LB bathing at sink as well as donned/doffed socks. Pt also ambulated in hallway. Min A for sidestepping with walker and also with turns in hallway.  Pt fatigued during session and educated on deep breathing technique-took break.    Mobility  Overal bed mobility: Needs Assistance Bed Mobility: Rolling;Sidelying to Sit Rolling: Min guard Sidelying to sit: Min assist Supine to sit: Mod assist Sit to supine: Mod assist General bed mobility comments: cues for log roll technique as pt c/o back pain.    Transfers  Overall transfer level: Needs assistance Equipment  used: Rolling walker (2 wheeled) Transfers: Sit to/from Stand Sit to Stand: Min assist;Min guard Stand pivot transfers: Mod assist General transfer comment: Min A from low chair. Cues for hand placement/technique.    Ambulation / Gait / Stairs / Wheelchair Mobility  Ambulation/Gait Ambulation/Gait assistance: Museum/gallery curator (Feet): 100 Feet Assistive device: Rolling walker (2 wheeled) Gait Pattern/deviations: Step-through pattern;Decreased stride length Gait velocity interpretation: <1.8 ft/sec, indicative of risk for recurrent falls General Gait Details: cues for position in Rw, pt with tendency to let left hand drift off walker, pulling RW toward right     Posture / Balance Dynamic Sitting Balance Sitting balance - Comments: pt initially unaware of deficit with visual assist of lining his posture up with P.T. EOB 9 min EOB with multimodal cues to achieve midline    Special needs/care consideration BiPAP/CPAP No CPM No Continuous Drip IV No Dialysis No        Life Vest No Oxygen No Special Bed No Trach Size No Wound Vac (area) No      Skin  Has some healing abrasions on left shin and knee                              Bowel mgmt: Last BM 04/01/14 Bladder mgmt: Using condom cath and urinal with incontinence Diabetic mgmt No    Previous Home Environment Living Arrangements: Alone Available Help at Discharge: Family Type of Home: House Home Layout: One level Home Access: Stairs to enter Technical brewer of Steps: 1 Bathroom Shower/Tub: Multimedia programmer: Handicapped height Bathroom Accessibility: No Home Care Services: No Additional Comments: son has rolling walker at home and to find it prior to pt gong home  Discharge Living Setting Plans for Discharge Living Setting: Patient's home;Alone;House (1st floor condominium.) Type of Home at Discharge: House (Lives in a condominium.) Discharge Home Layout: One level Discharge Home Access: Level entry Does the patient have any problems obtaining your medications?: No  Social/Family/Support Systems Patient Roles: Parent (Widower X 4 months.  Has 2 sons and 1 daughter.) Contact Information: Maliq Pilley - son [phone] Anticipated Caregiver: Son lives close by and works.  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 Pt/Family Agrees to Admission and willing to participate: Yes Program Orientation Provided & Reviewed with Pt/Caregiver Including Roles  & Responsibilities:  Yes  Decrease burden of Care through IP rehab admission: N/A  Possible need for SNF placement upon discharge: Yes, if patient is not able to arrange any aftercare at the time of discharge.  Patient Condition: This patient's medical and functional status has changed since the consult dated: 03/29/14 in which the Rehabilitation Physician determined and documented that the patient's condition is appropriate for intensive rehabilitative care in an inpatient rehabilitation facility. See "History of Present Illness" (above) for medical update. Functional changes are:  Currently requiring min assist to ambulate 100 ft with 2 wheeled RW. Patient's medical and functional status update has been discussed with the  Rehabilitation physician and patient remains appropriate for inpatient rehabilitation. Will admit to inpatient rehab today.  Preadmission Screen Completed By:  [name], 04/01/2014 1:29 PM ______________________________________________________________________   Discussed status with Dr. Naaman Plummer on 04/01/14 at 1356 and received telephone approval for admission today.  Admission Coordinator:  [name], time1356/Date06/01/15

## 2014-04-01 NOTE — H&P (Signed)
Physical Medicine and Rehabilitation Admission H&P  Chief Complaint   Patient presents with   .  Fall   .  Weakness   :  HPI: Carl Rios is a 80 y.o. right-handed male history of peripheral neuropathy. Presented 03/26/2014 after a fall and remained down for approximately 12 hours until found by family. Patient independent prior to admission living alone and driving. Noted total CK of 1550, troponin negative, white blood cell count 14,100 and BUN 27. Cranial CT scan with no evidence of intracranial abnormality noted chronic lacunar infarct at the right thalamus. Patient did not receive TPA. The thoracic and lumbar spines negative for acute changes. Pelvis films without fracture. MRI of the brain revealed an acute right PCA infarct. Carotid Dopplers with no ICA stenosis. Echocardiogram with ejection fraction of 51% grade 1 diastolic dysfunction. Maintained on aspirin for CVA prophylaxis. Patient was placed on gentle IV fluids with latest BUN and creatinine within normal limits. Total CK again within normal limits at 63. Subcutaneous Lovenox added for DVT prophylaxis. He is tolerating a mechanical soft diet. Physical occupational therapy evaluations completed. Patient requiring Max assist to prevent falls in getting back to bed with loss of balance. Recommendations by physical and occupational therapy are made for physical medicine rehabilitation consult. Patient was admitted for comprehensive rehabilitation program   ROS Review of Systems  HENT: Positive for hearing loss.  Gastrointestinal:  GERD  Genitourinary: Positive for frequency.  Musculoskeletal: Positive for falls. Thigh pain All other systems reviewed and are negative    Past Medical History   Diagnosis  Date   .  AAA (abdominal aortic aneurysm) without rupture    .  Hyperlipidemia    .  Diverticulosis    .  GERD (gastroesophageal reflux disease)    .  Hx of adenomatous colonic polyps    .  Peripheral vascular disease    .   Obesity    .  Dyslipidemia    .  History of colon polyps    .  History of prostatitis    .  Hearing difficulty    .  Peripheral neuropathy     Past Surgical History   Procedure  Laterality  Date   .  Knee surgery       left knee in 1970's   .  Colonoscopy   01/24/2012     Procedure: COLONOSCOPY; Surgeon: Lafayette Dragon, MD; Location: [facility]; Service: Endoscopy; Laterality: N/A;   .  Inguinal hernia repair     .  Cataract extraction  Bilateral    .  Tonsillectomy     .  Abdominal aortic endovascular stent graft  N/A  08/23/2013     Procedure: ABDOMINAL AORTIC ENDOVASCULAR STENT GRAFT; Surgeon: Mal Misty, MD; Location: [facility]; Service: Vascular; Laterality: N/A;   .  Abdominal aortic aneurysm repair      Family History   Problem  Relation  Age of Onset   .  Heart disease  Father  63     Heart diease before age 66    Social History: reports that he quit smoking about 55 years ago. He has never used smokeless tobacco. He reports that he does not drink alcohol or use illicit drugs.  Allergies:  Allergies   Allergen  Reactions   .  Sulfamethoxazole  Other (See Comments)     UNKNOWN    Medications Prior to Admission   Medication  Sig  Dispense  Refill   .  DULoxetine (  CYMBALTA) 30 MG capsule  Take 1 capsule (30 mg total) by mouth daily.  90 capsule  1   .  OVER THE COUNTER MEDICATION  Take 1 tablet by mouth 2 (two) times daily. OTC supplement "Macular Protect Plus"      Home:  Home Living  Family/patient expects to be discharged to:: Private residence  Living Arrangements: Alone  Available Help at Discharge: Family  Type of Home: House  Home Access: Stairs to enter  Technical brewer of Steps: 1  Home Layout: One level  Home Equipment: [place] - single point  Additional Comments: son has rolling walker at home and to find it prior to pt gong home  Functional History:  Prior Function  Level of Independence: Independent  Comments: drove  Functional Status:    Mobility:  Bed Mobility  Overal bed mobility: Needs Assistance  Bed Mobility: Rolling;Sidelying to Sit  Rolling: Min guard  Sidelying to sit: Min assist  Supine to sit: Mod assist  Sit to supine: Mod assist  General bed mobility comments: cues for log roll technique as pt c/o back pain.  Transfers  Overall transfer level: Needs assistance  Equipment used: Rolling walker (2 wheeled)  Transfers: Sit to/from Stand  Sit to Stand: Min assist;Min guard  Stand pivot transfers: Mod assist  General transfer comment: Min A from low chair. Cues for hand placement/technique.  Ambulation/Gait  Ambulation/Gait assistance: Min assist  Ambulation Distance (Feet): 100 Feet  Assistive device: Rolling walker (2 wheeled)  Gait Pattern/deviations: Step-through pattern;Decreased stride length  Gait velocity interpretation: <1.8 ft/sec, indicative of risk for recurrent falls  General Gait Details: cues for position in [place], pt with tendency to let left hand drift off walker, pulling RW toward right   ADL:  ADL  Overall ADL's : Needs assistance/impaired  Eating/Feeding: Set up  Grooming: Wash/dry face;Min guard;Standing  Upper Body Bathing: Min guard;Sitting  Lower Body Bathing: Moderate assistance;Sit to/from stand  Upper Body Dressing : Sitting;Set up;Supervision/safety  Lower Body Dressing: Sitting/lateral leans;Minimal assistance (socks)  Toilet Transfer: Minimal assistance;Ambulation;Regular Toilet;RW;Grab bars  Toileting- Clothing Manipulation and Hygiene: Minimal assistance;Sit to/from stand  Functional mobility during ADLs: Minimal assistance;Min guard;Rolling walker  General ADL Comments: Pt soiled gown due to catheter not being secured well. Performed LB bathing at sink as well as donned/doffed socks. Pt also ambulated in hallway. Min A for sidestepping with walker and also with turns in hallway. Pt fatigued during session and educated on deep breathing technique-took break.  Cognition:   Cognition  Overall Cognitive Status: Impaired/Different from baseline  Orientation Level: Oriented X4  Cognition  Arousal/Alertness: Awake/alert  Behavior During Therapy: WFL for tasks assessed/performed  Overall Cognitive Status: Impaired/Different from baseline  Area of Impairment: Problem solving  Memory: Decreased short-term memory  Safety/Judgement: Decreased awareness of safety;Decreased awareness of deficits  Awareness: Emergent  Problem Solving: Slow processing  General Comments: Pt with significant posterior left lean particularly in sitting initially unaware. Pt also unaware of incontinent of urine. pt repeatedly instructed for need of assist with mobility but unable to recall after 5x education   Physical Exam:  Blood pressure 144/91, pulse 129, temperature 98.4 F (36.9 C), temperature source Oral, resp. rate 18, height _0  (1.727 m), weight 92.987 kg (205 lb), SpO2 92.00%.  Physical Exam  HENT: oral mucosa pink, dentition fair Head: Normocephalic.  Eyes: EOM are normal.  Neck: Normal range of motion. Neck supple. No thyromegaly present.  Cardiovascular: Normal rate and regular rhythm. No murmur Respiratory: Effort normal  and breath sounds normal. No respiratory distress. Occasional exp wheeze GI: Soft. Bowel sounds are normal. He exhibits no distension.  Neurological: He is alert.  Patient is very hard of hearing. Mild left facial droop. Decreased FMC LUE and LLE. Strength nearly symmetrical. UE 4+/5 prox to distal bilaterally.  LE's 3/5 HF, 3+/5, 4/5 ankles. Has some discomfort with quad extension. Sensory exam intact. DTR's 1+ in all 4's. He is appropriate to person, place, date of birth and age. He follows full commands. Intact basic insight and awareness.  Skin: Skin is warm.  Abrasions on the left leg noted.  Psychiatric: He has a normal mood and affect     Results for orders placed during the hospital encounter of 03/26/14 (from the past 48 hour(s))   CK  Status: None    Collection Time    03/31/14 6:12 AM   Result  Value  Ref Range    Total CK  93  7 - 232 U/L   BASIC METABOLIC PANEL Status: Abnormal    Collection Time    03/31/14 6:12 AM   Result  Value  Ref Range    Sodium  138  137 - 147 mEq/L    Potassium  3.9  3.7 - 5.3 mEq/L    Chloride  102  96 - 112 mEq/L    CO2  22  19 - 32 mEq/L    Glucose, Bld  105 (*)  70 - 99 mg/dL    BUN  15  6 - 23 mg/dL    Creatinine, Ser  0.82  0.50 - 1.35 mg/dL    Calcium  8.9  8.4 - 10.5 mg/dL    GFR calc non Af Amer  78 (*)  >90 mL/min    GFR calc Af Amer  >90  >90 mL/min    Comment:  (NOTE)     The eGFR has been calculated using the CKD EPI equation.     This calculation has not been validated in all clinical situations.     eGFR's persistently <90 mL/min signify possible Chronic Kidney     Disease.   CBC Status: None    Collection Time    03/31/14 6:12 AM   Result  Value  Ref Range    WBC  7.6  4.0 - 10.5 K/uL    RBC  4.82  4.22 - 5.81 MIL/uL    Hemoglobin  14.5  13.0 - 17.0 g/dL    HCT  42.8  39.0 - 52.0 %    MCV  88.8  78.0 - 100.0 fL    MCH  30.1  26.0 - 34.0 pg    MCHC  33.9  30.0 - 36.0 g/dL    RDW  14.6  11.5 - 15.5 %    Platelets  259  150 - 400 K/uL   CK Status: None    Collection Time    04/01/14 5:45 AM   Result  Value  Ref Range    Total CK  63  7 - 232 U/L   BASIC METABOLIC PANEL Status: Abnormal    Collection Time    04/01/14 5:45 AM   Result  Value  Ref Range    Sodium  139  137 - 147 mEq/L    Potassium  3.9  3.7 - 5.3 mEq/L    Chloride  101  96 - 112 mEq/L    CO2  25  19 - 32 mEq/L    Glucose, Bld  108 (*)  70 - 99 mg/dL    BUN  20  6 - 23 mg/dL    Creatinine, Ser  0.85  0.50 - 1.35 mg/dL    Calcium  9.2  8.4 - 10.5 mg/dL    GFR calc non Af Amer  77 (*)  >90 mL/min    GFR calc Af Amer  89 (*)  >90 mL/min    Comment:  (NOTE)     The eGFR has been calculated using the CKD EPI equation.     This calculation has not been validated in all clinical situations.       eGFR's persistently <90 mL/min signify possible Chronic Kidney     Disease.   CBC Status: None    Collection Time    04/01/14 5:45 AM   Result  Value  Ref Range    WBC  8.9  4.0 - 10.5 K/uL    RBC  4.96  4.22 - 5.81 MIL/uL    Hemoglobin  14.6  13.0 - 17.0 g/dL    HCT  45.0  39.0 - 52.0 %    MCV  90.7  78.0 - 100.0 fL    MCH  29.4  26.0 - 34.0 pg    MCHC  32.4  30.0 - 36.0 g/dL    RDW  14.9  11.5 - 15.5 %    Platelets  302  150 - 400 K/uL    No results found.    Medical Problem List and Plan:  1. Functional deficits secondary to right PCA infarct/rhabdomyolysis  2. DVT Prophylaxis/Anticoagulation: Subcutaneous Lovenox. Monitor platelet counts and any signs of bleeding  3. Pain Management: Tylenol as needed, heat/stretching/strengthening for quads/proximal LE's 4. Neuropsych: This patient is capable of making decisions on his own behalf.  5. History of peripheral neuropathy. Continue Cymbalta.  6. Hypertension. Toprol-XL 12.5 mg daily. Monitor with increased mobility    Post Admission Physician Evaluation:  1. Functional deficits secondary to right PCA infarct. Also with rhabdomyolysis. 2. Patient is admitted to receive collaborative, interdisciplinary care between the physiatrist, rehab nursing staff, and therapy team. 3. Patient's level of medical complexity and substantial therapy needs in context of that medical necessity cannot be provided at a lesser intensity of care such as a SNF. 4. Patient has experienced substantial functional loss from his/her baseline which was documented above under the "Functional History" and "Functional Status" headings. Judging by the patient's diagnosis, physical exam, and functional history, the patient has potential for functional progress which will result in measurable gains while on inpatient rehab. These gains will be of substantial and practical use upon discharge in facilitating mobility and self-care at the household  level. 5. Physiatrist will provide 24 hour management of medical needs as well as oversight of the therapy plan/treatment and provide guidance as appropriate regarding the interaction of the two. 6. 24 hour rehab nursing will assist with bladder management, bowel management, safety, skin/wound care, disease management, medication administration, pain management and patient education and help integrate therapy concepts, techniques,education, etc. 7. PT will assess and treat for/with: Lower extremity strength, range of motion, stamina, balance, functional mobility, safety, adaptive techniques and equipment, pain mgt, NMR, cognitive perceptual awareness, caregiver education, stroke ed. Goals are: supervision to mod I. 8. OT will assess and treat for/with: ADL's, functional mobility, safety, upper extremity strength, adaptive techniques and equipment, NMR, cognitive perceptual awareness. Goals are: supervision to mod I. 9. SLP will assess and treat for/with: speech, cognition, dysphagia Goals are: supervision to mod I 10.  Case Management and Social Worker will assess and treat for psychological issues and discharge planning. 11. Team conference will be held weekly to assess progress toward goals and to determine barriers to discharge. 12. Patient will receive at least 3 hours of therapy per day at least 5 days per week. 13. ELOS: 10-14 days  14. Prognosis: good  Meredith Staggers, MD, [facility]   04/01/2014

## 2014-04-01 NOTE — Progress Notes (Signed)
Rehab admissions - Evaluated for possible admission.  I met with patient.  He lives alone.  His son lives close by and his son works.  Patient and his son have been discussing hiring caregiver after discharge to assist at home.  Patient would like to come to acute inpatient rehab.  Bed available and will admit to acute inpatient rehab today.  Call me for questions.  #[phone]

## 2014-04-01 NOTE — Progress Notes (Signed)
Pt is admitted to 4W07 from [facility]. Admission vital signs are stable

## 2014-04-01 NOTE — Progress Notes (Signed)
Telemetry Center called to inform that pt had  a run of Afib (130's) Pt showed no s/s of distress , as RN was preparing to do an EKG, pt flipped back into NSR in 80's. Will continue to monitor. Pt had received a full bath and stood by the bed along with had condom cath placed approx. 30 minutes prior to episode.

## 2014-04-01 NOTE — Progress Notes (Signed)
DISCHARGE INSTRUCTIONS AND EDUCATION PRINTED AND GIVEN TO THE PATIENT    Ischemic Stroke A stroke (cerebrovascular accident) is the sudden death of brain tissue. It is a medical emergency. A stroke can cause permanent loss of brain function. This can cause problems with different parts of your body. A transient ischemic attack (TIA) is different because it does not cause permanent damage. A TIA is a short-lived problem of poor blood flow affecting a part of the brain. A TIA is also a serious problem because having a TIA greatly increases the chances of having a stroke. When symptoms first develop, you cannot know if the problem might be a stroke or TIA. CAUSES  A stroke is caused by a decrease of oxygen supply to an area of your brain. It is usually the result of a small blood clot or collection of cholesterol or fat (plaque) that blocks blood flow in the brain. A stroke can also be caused by blocked or damaged carotid arteries.  RISK FACTORS  High blood pressure (hypertension).  High cholesterol.  Diabetes mellitus.  Heart disease.  The build up of plaque in the blood vessels (peripheral artery disease or atherosclerosis).  The build up of plaque in the blood vessels providing blood and oxygen to the brain (carotid artery stenosis).  An abnormal heart rhythm (atrial fibrillation).  Obesity.  Smoking.  Taking oral contraceptives (especially in combination with smoking).  Physical inactivity.  A diet high in fats, salt (sodium), and calories.  Alcohol use.  Use of illegal drugs (especially cocaine and methamphetamine).  Being African American.  Being over the age of 71.  Family history of stroke.  Previous history of blood clots, stroke, TIA, or heart attack.  Sickle cell disease. SYMPTOMS  These symptoms usually develop suddenly, or may be newly present upon awakening from sleep:  Sudden weakness or numbness of the face, arm, or leg, especially on one side of the  body.  Sudden trouble walking or difficulty moving arms or legs.  Sudden confusion.  Sudden personality changes.  Trouble speaking (aphasia) or understanding.  Difficulty swallowing.  Sudden trouble seeing in one or both eyes.  Double vision.  Dizziness.  Loss of balance or coordination.  Sudden severe headache with no known cause.  Trouble reading or writing. DIAGNOSIS  Your caregiver can often determine the presence or absence of a stroke based on your symptoms, history, and physical exam. Computed tomography (CT) of the brain is usually performed to confirm the stroke, determine causes, and determine stroke severity. Other tests may be done to find the cause of the stroke. These tests may include:  Electrocardiography.  Continuous heart monitoring.  Echocardiography.  Carotid ultrasonography.  Magnetic resonance imaging (MRI).  A scan of the brain circulation.  Blood tests. PREVENTION  The risk of a stroke can be decreased by appropriately treating high blood pressure, high cholesterol, diabetes, heart disease, and obesity and by quitting smoking, limiting alcohol, and staying physically active. TREATMENT  Time is of the essence. It is important to seek treatment within 3 4 hours of the start of symptoms because you may receive a medicine to dissolve the clot (thrombolytic) that cannot be given after that time. Even if you do not know when your symptoms began, get treatment as soon as possible. After the 4 hour window has passed, treatment may include rest, oxygen, intravenous (IV) fluids, and medicines to thin the blood (anticoagulants). Treatment of stroke depends on the duration, severity, and cause of your symptoms. Medicines and  diet may be used to address diabetes, high blood pressure, and other risk factors. Physical, speech, and occupational therapists will assess you and work to improve any functions impaired by the stroke. Measures will be taken to prevent  short-term and long-term complications, including infection from breathing foreign material into the lungs (aspiration pneumonia), blood clots in the legs, bedsores, and falls. Rarely, surgery may be needed to remove large blood clots or to open up blocked arteries. HOME CARE INSTRUCTIONS   Take all medicines prescribed by your caregiver. Follow the directions carefully. Medicines may be used to control risk factors for a stroke. Be sure you understand all your medicine instructions.  You may be told to take aspirin or the anticoagulant warfarin. Warfarin needs to be taken exactly as instructed.  Too much and too little warfarin are both dangerous. Too much warfarin increases the risk of bleeding. Too little warfarin continues to allow the risk for blood clots. While taking warfarin, you will need to have regular blood tests to measure your blood clotting time. These blood tests usually include both the PT and INR tests. The PT and INR results allow your caregiver to adjust your dose of warfarin. The dose can change for many reasons. It is critically important that you take warfarin exactly as prescribed, and that you have your PT and INR levels drawn exactly as directed.  Many foods, especially foods high in vitamin K can interfere with warfarin and affect the PT and INR results. Foods high in vitamin K include spinach, kale, broccoli, cabbage, collard and turnip greens, brussels sprouts, peas, cauliflower, seaweed, and parsley as well as beef and pork liver, green tea, and soybean oil. You should eat a consistent amount of foods high in vitamin K. Avoid major changes in your diet, or notify your caregiver before changing your diet. Arrange a visit with a dietitian to answer your questions.  Many medicines can interfere with warfarin and affect the PT and INR results. You must tell your caregiver about any and all medicines you take, this includes all vitamins and supplements. Be especially cautious with  aspirin and anti-inflammatory medicines. Do not take or discontinue any prescribed or over-the-counter medicine except on the advice of your caregiver or pharmacist.  Warfarin can have side effects, such as excessive bruising or bleeding. You will need to hold pressure over cuts for longer than usual. Your caregiver or pharmacist will discuss other potential side effects.  Avoid sports or activities that may cause injury or bleeding.  Be mindful when shaving, flossing your teeth, or handling sharp objects.  Alcohol can change the body's ability to handle warfarin. It is best to avoid alcoholic drinks or consume only very small amounts while taking warfarin. Notify your caregiver if you change your alcohol intake.  Notify your dentist or other caregivers before procedures.  If swallow studies have determined that your swallowing reflex is present, you should eat healthy foods. A diet that includes 5 or more servings of fruits and vegetables a day may reduce the risk of stroke. Foods may need to be a special consistency (soft or pureed), or small bites may need to be taken in order to avoid aspirating or choking. Certain diets may be prescribed to address high blood pressure, high cholesterol, diabetes, or obesity.  A low-sodium, low-saturated fat, low-trans fat, low-cholesterol diet is recommended to manage high blood pressure.  A low-saturated fat, low-trans fat, low-cholesterol, and high-fiber diet may control cholesterol levels.  A controlled-carbohydrate, controlled-sugar diet is recommended  to manage diabetes.  A reduced-calorie, low-sodium, low-saturated fat, low-trans fat, low-cholesterol diet is recommended to manage obesity.  Maintain a healthy weight.  Stay physically active. It is recommended that you get at least 30 minutes of activity on most or all days.  Do not smoke.  Limit alcohol use even if you are not taking warfarin. Moderate alcohol use is considered to be:  No more  than 2 drinks each day for men.  No more than 1 drink each day for nonpregnant women.  Stop drug abuse.  Home safety. A safe home environment is important to reduce the risk of falls. Your caregiver may arrange for specialists to evaluate your home. Having grab bars in the bedroom and bathroom is often important. Your caregiver may arrange for equipment to be used at home, such as raised toilets and a seat for the shower.  Physical, occupational, and speech therapy. Ongoing therapy may be needed to maximize your recovery after a stroke. If you have been advised to use a walker or a cane, use it at all times. Be sure to keep your therapy appointments.  Follow all instructions for follow-up with your caregiver. This is very important. This includes any referrals, physical therapy, rehabilitation, and lab tests. Proper follow up can prevent another stroke from occurring. SEEK MEDICAL CARE IF:  You have personality changes.  You have difficulty swallowing.  You are seeing double.  You have dizziness.  You have a fever.  You have skin breakdown. SEEK IMMEDIATE MEDICAL CARE IF:  Any of these symptoms may represent a serious problem that is an emergency. Do not wait to see if the symptoms will go away. Get medical help right away. Call your local emergency services (911 in [place] Do not drive yourself to the hospital.  You have sudden weakness or numbness of the face, arm, or leg, especially on one side of the body.  You have sudden trouble walking or difficulty moving arms or legs.  You have sudden confusion.  You have trouble speaking (aphasia) or understanding.  You have sudden trouble seeing in one or both eyes.  You have a loss of balance or coordination.  You have a sudden, severe headache with no known cause.  You have new chest pain or an irregular heartbeat.  You have a partial or total loss of consciousness.   Document Released: 10/18/2005 Document Revised: 06/20/2013  Document Reviewed: 05/28/2012 [facility] Patient Information 2014 [place].    Fall Prevention and Home Safety Falls cause injuries and can affect all age groups. It is possible to use preventive measures to significantly decrease the likelihood of falls. There are many simple measures which can make your home safer and prevent falls. OUTDOORS  Repair cracks and edges of walkways and driveways.  Remove high doorway thresholds.  Trim shrubbery on the main path into your home.  Have good outside lighting.  Clear walkways of tools, rocks, debris, and clutter.  Check that handrails are not broken and are securely fastened. Both sides of steps should have handrails.  Have leaves, snow, and ice cleared regularly.  Use sand or salt on walkways during winter months.  In the garage, clean up grease or oil spills. BATHROOM  Install night lights.  Install grab bars by the toilet and in the tub and shower.  Use non-skid mats or decals in the tub or shower.  Place a plastic non-slip stool in the shower to sit on, if needed.  Keep floors dry and clean up  all water on the floor immediately.  Remove soap buildup in the tub or shower on a regular basis.  Secure bath mats with non-slip, double-sided rug tape.  Remove throw rugs and tripping hazards from the floors. BEDROOMS  Install night lights.  Make sure a bedside light is easy to reach.  Do not use oversized bedding.  Keep a telephone by your bedside.  Have a firm chair with side arms to use for getting dressed.  Remove throw rugs and tripping hazards from the floor. KITCHEN  Keep handles on pots and pans turned toward the center of the stove. Use back burners when possible.  Clean up spills quickly and allow time for drying.  Avoid walking on wet floors.  Avoid hot utensils and knives.  Position shelves so they are not too high or low.  Place commonly used objects within easy reach.  If necessary, use a  sturdy step stool with a grab bar when reaching.  Keep electrical cables out of the way.  Do not use floor polish or wax that makes floors slippery. If you must use wax, use non-skid floor wax.  Remove throw rugs and tripping hazards from the floor. STAIRWAYS  Never leave objects on stairs.  Place handrails on both sides of stairways and use them. Fix any loose handrails. Make sure handrails on both sides of the stairways are as long as the stairs.  Check carpeting to make sure it is firmly attached along stairs. Make repairs to worn or loose carpet promptly.  Avoid placing throw rugs at the top or bottom of stairways, or properly secure the rug with carpet tape to prevent slippage. Get rid of throw rugs, if possible.  Have an electrician put in a light switch at the top and bottom of the stairs. OTHER FALL PREVENTION TIPS  Wear low-heel or rubber-soled shoes that are supportive and fit well. Wear closed toe shoes.  When using a stepladder, make sure it is fully opened and both spreaders are firmly locked. Do not climb a closed stepladder.  Add color or contrast paint or tape to grab bars and handrails in your home. Place contrasting color strips on first and last steps.  Learn and use mobility aids as needed. Install an electrical emergency response system.  Turn on lights to avoid dark areas. Replace light bulbs that burn out immediately. Get light switches that glow.  Arrange furniture to create clear pathways. Keep furniture in the same place.  Firmly attach carpet with non-skid or double-sided tape.  Eliminate uneven floor surfaces.  Select a carpet pattern that does not visually hide the edge of steps.  Be aware of all pets. OTHER HOME SAFETY TIPS  Set the water temperature for 120 F (48.8 C).  Keep emergency numbers on or near the telephone.  Keep smoke detectors on every level of the home and near sleeping areas. Document Released: 10/08/2002 Document Revised:  04/18/2012 Document Reviewed: 01/07/2012 [facility] Patient Information 2014 [place].   STROKE/TIA DISCHARGE INSTRUCTIONS SMOKING Cigarette smoking nearly doubles your risk of having a stroke & is the single most alterable risk factor  If you smoke or have smoked in the last 12 months, you are advised to quit smoking for your health.  Most of the excess cardiovascular risk related to smoking disappears within a year of stopping.  Ask you doctor about anti-smoking medications  [place] Quit Line: 1-800-QUIT NOW  Free Smoking Cessation Classes [phone]  CHOLESTEROL Know your levels; limit fat & cholesterol  in your diet  Lipid Panel     Component Value Date/Time   CHOL 164 03/30/2014 0540   TRIG 116 03/30/2014 0540   HDL 34* 03/30/2014 0540   CHOLHDL 4.8 03/30/2014 0540   VLDL 23 03/30/2014 0540   LDLCALC 107* 03/30/2014 0540      Many patients benefit from treatment even if their cholesterol is at goal.  Goal: Total Cholesterol (CHOL) less than 160  Goal:  Triglycerides (TRIG) less than 150  Goal:  HDL greater than 40  Goal:  LDL (LDLCALC) less than 100   BLOOD PRESSURE American Stroke Association blood pressure target is less that 120/80 mm/Hg  Your discharge blood pressure is:  BP: 165/45 mmHg  Monitor your blood pressure  Limit your salt and alcohol intake  Many individuals will require more than one medication for high blood pressure  DIABETES (A1c is a blood sugar average for last 3 months) Goal HGBA1c is under 7% (HBGA1c is blood sugar average for last 3 months)  Diabetes: No known diagnosis of diabetes    No results found for this basename: HGBA1C     Your HGBA1c can be lowered with medications, healthy diet, and exercise.  Check your blood sugar as directed by your physician  Call your physician if you experience unexplained or low blood sugars.  PHYSICAL ACTIVITY/REHABILITATION Goal is 30 minutes at least 4 days per week  Activity: No  restrictions. Therapies: INPATIENT REHAB ON 4W Return to work: NA  Activity decreases your risk of heart attack and stroke and makes your heart stronger.  It helps control your weight and blood pressure; helps you relax and can improve your mood.  Participate in a regular exercise program.  Talk with your doctor about the best form of exercise for you (dancing, walking, swimming, cycling).  DIET/WEIGHT Goal is to maintain a healthy weight  Your discharge diet is: Dysphagia THIN liquids MEDICATIONS WHOLE IN APPLESAUCE Your height is:  Height: 5\' 8"  (172.7 cm) Your current weight is: Weight: 92.987 kg (205 lb) Your Body Mass Index (BMI) is:  BMI (Calculated): 30.2  Following the type of diet specifically designed for you will help prevent another stroke.  Your goal weight range is:  125-158  Your goal Body Mass Index (BMI) is 19-24.  Healthy food habits can help reduce 3 risk factors for stroke:  High cholesterol, hypertension, and excess weight.  RESOURCES Stroke/Support Group:  Call [phone]   STROKE EDUCATION PROVIDED/REVIEWED AND GIVEN TO PATIENT Stroke warning signs and symptoms How to activate emergency medical system (call 911). Medications prescribed at discharge. Need for follow-up after discharge. Personal risk factors for stroke. Pneumonia vaccine given: No Flu vaccine given: No My questions have been answered, the writing is legible, and I understand these instructions.  I will adhere to these goals & educational materials that have been provided to me after my discharge from the hospital.    Cardiac Diet This diet can help prevent heart disease and stroke. Many factors influence your heart health, including eating and exercise habits. Coronary risk rises a lot with abnormal blood fat (lipid) levels. Cardiac meal planning includes limiting unhealthy fats, increasing healthy fats, and making other small dietary changes. General guidelines are as follows:  Adjust  calorie intake to reach and maintain desirable body weight.  Limit total fat intake to less than 30% of total calories. Saturated fat should be less than 7% of calories.  Saturated fats are found in animal products and in some vegetable products.  Saturated vegetable fats are found in coconut oil, cocoa butter, palm oil, and palm kernel oil. Read labels carefully to avoid these products as much as possible. Use butter in moderation. Choose tub margarines and oils that have 2 grams of fat or less. Good cooking oils are canola and olive oils.  Practice low-fat cooking techniques. Do not fry food. Instead, broil, bake, boil, steam, grill, roast on a rack, stir-fry, or microwave it. Other fat reducing suggestions include:  Remove the skin from poultry.  Remove all visible fat from meats.  Skim the fat off stews, soups, and gravies before serving them.  Steam vegetables in water or broth instead of sauting them in fat.  Avoid foods with trans fat (or hydrogenated oils), such as commercially fried foods and commercially baked goods. Commercial shortening and deep-frying fats will contain trans fat.  Increase intake of fruits, vegetables, whole grains, and legumes to replace foods high in fat.  Increase consumption of nuts, legumes, and seeds to at least 4 servings weekly. One serving of a legume equals  cup, and 1 serving of nuts or seeds equals  cup.  Choose whole grains more often. Have 3 servings per day (a serving is 1 ounce [oz]).  Eat 4 to 5 servings of vegetables per day. A serving of vegetables is 1 cup of raw leafy vegetables;  cup of raw or cooked cut-up vegetables;  cup of vegetable juice.  Eat 4 to 5 servings of fruit per day. A serving of fruit is 1 medium whole fruit;  cup of dried fruit;  cup of fresh, frozen, or canned fruit;  cup of 100% fruit juice.  Increase your intake of dietary fiber to 20 to 30 grams per day. Insoluble fiber may help lower your risk of heart  disease and may help curb your appetite.  Soluble fiber binds cholesterol to be removed from the blood. Foods high in soluble fiber are dried beans, citrus fruits, oats, apples, bananas, broccoli, Brussels sprouts, and eggplant.  Try to include foods fortified with plant sterols or stanols, such as yogurt, breads, juices, or margarines. Choose several fortified foods to achieve a daily intake of 2 to 3 grams of plant sterols or stanols.  Foods with omega-3 fats can help reduce your risk of heart disease. Aim to have a 3.5 oz portion of fatty fish twice per week, such as salmon, mackerel, albacore tuna, sardines, lake trout, or herring. If you wish to take a fish oil supplement, choose one that contains 1 gram of both DHA and EPA.  Limit processed meats to 2 servings (3 oz portion) weekly.  Limit the sodium in your diet to 1500 milligrams (mg) per day. If you have high blood pressure, talk to a registered dietitian about a DASH (Dietary Approaches to Stop Hypertension) eating plan.  Limit sweets and beverages with added sugar, such as soda, to no more than 5 servings per week. One serving is:   1 tablespoon sugar.  1 tablespoon jelly or jam.   cup sorbet.  1 cup lemonade.   cup regular soda. CHOOSING FOODS Starches  Allowed: Breads: All kinds (wheat, rye, raisin, white, oatmeal, [place], [place], and English muffin bread). Low-fat rolls: English muffins, frankfurter and hamburger buns, bagels, pita bread, tortillas (not fried). Pancakes, waffles, biscuits, and muffins made with recommended oil.  Avoid: Products made with saturated or trans fats, oils, or whole milk products. Butter rolls, cheese breads, croissants. Commercial doughnuts, muffins, sweet rolls, biscuits, waffles, pancakes, store-bought mixes. Crackers  Allowed: Low-fat crackers and snacks: Animal, graham, rye, saltine (with recommended oil, no lard), oyster, and matzo crackers. Bread sticks, melba toast, rusks, flatbread,  pretzels, and light popcorn.  Avoid: High-fat crackers: cheese crackers, butter crackers, and those made with coconut, palm oil, or trans fat (hydrogenated oils). Buttered popcorn. Cereals  Allowed: Hot or cold whole-grain cereals.  Avoid: Cereals containing coconut, hydrogenated vegetable fat, or animal fat. Potatoes / Pasta / Rice  Allowed: All kinds of potatoes, rice, and pasta (such as macaroni, spaghetti, and noodles).  Avoid: Pasta or rice prepared with cream sauce or high-fat cheese. Chow mein noodles, [place] fries. Vegetables  Allowed: All vegetables and vegetable juices.  Avoid: Fried vegetables. Vegetables in cream, butter, or high-fat cheese sauces. Limit coconut. Fruit in cream or custard. Protein  Allowed: Limit your intake of meat, seafood, and poultry to no more than 6 oz (cooked weight) per day. All lean, well-trimmed beef, veal, pork, and lamb. All chicken and [place] without skin. All fish and shellfish. Wild game: wild duck, rabbit, pheasant, and venison. Egg whites or low-cholesterol egg substitutes may be used as desired. Meatless dishes: recipes with dried beans, peas, lentils, and tofu (soybean curd). Seeds and nuts: all seeds and most nuts.  Avoid: Prime grade and other heavily marbled and fatty meats, such as short ribs, spare ribs, rib eye roast or steak, frankfurters, sausage, bacon, and high-fat luncheon meats, mutton. Caviar. Commercially fried fish. Domestic duck, goose, venison sausage. Organ meats: liver, gizzard, heart, chitterlings, brains, kidney, sweetbreads. Dairy  Allowed: Low-fat cheeses: nonfat or low-fat cottage cheese (1% or 2% fat), cheeses made with part skim milk, such as mozzarella, farmers, string, or ricotta. (Cheeses should be labeled no more than 2 to 6 grams fat per oz.). Skim (or 1%) milk: liquid, powdered, or evaporated. Buttermilk made with low-fat milk. Drinks made with skim or low-fat milk or cocoa. Chocolate milk or cocoa made with skim  or low-fat (1%) milk. Nonfat or low-fat yogurt.  Avoid: Whole milk cheeses, including colby, cheddar, [place], Monterey Jack, [place], [place], [place], American, Swiss, and blue. Creamed cottage cheese, cream cheese. Whole milk and whole milk products, including buttermilk or yogurt made from whole milk, drinks made from whole milk. Condensed milk, evaporated whole milk, and 2% milk. Soups and Combination Foods  Allowed: Low-fat low-sodium soups: broth, dehydrated soups, homemade broth, soups with the fat removed, homemade cream soups made with skim or low-fat milk. Low-fat spaghetti, lasagna, chili, and Spanish rice if low-fat ingredients and low-fat cooking techniques are used.  Avoid: Cream soups made with whole milk, cream, or high-fat cheese. All other soups. Desserts and Sweets  Allowed: Sherbet, fruit ices, gelatins, meringues, and angel food cake. Homemade desserts with recommended fats, oils, and milk products. Jam, jelly, honey, marmalade, sugars, and syrups. Pure sugar candy, such as gum drops, hard candy, jelly beans, marshmallows, mints, and small amounts of dark chocolate.  Avoid: Commercially prepared cakes, pies, cookies, frosting, pudding, or mixes for these products. Desserts containing whole milk products, chocolate, coconut, lard, palm oil, or palm kernel oil. Ice cream or ice cream drinks. Candy that contains chocolate, coconut, butter, hydrogenated fat, or unknown ingredients. Buttered syrups. Fats and Oils  Allowed: Vegetable oils: safflower, sunflower, corn, soybean, cottonseed, sesame, canola, olive, or peanut. Non-hydrogenated margarines. Salad dressing or mayonnaise: homemade or commercial, made with a recommended oil. Low or nonfat salad dressing or mayonnaise.  Limit added fats and oils to 6 to 8 tsp per day (includes fats used in cooking,  baking, salads, and spreads on bread). Remember to count the "hidden fats" in foods.  Avoid: Solid fats and shortenings: butter,  lard, salt pork, bacon drippings. Gravy containing meat fat, shortening, or suet. Cocoa butter, coconut. Coconut oil, palm oil, palm kernel oil, or hydrogenated oils: these ingredients are often used in bakery products, nondairy creamers, whipped toppings, candy, and commercially fried foods. Read labels carefully. Salad dressings made of unknown oils, sour cream, or cheese, such as blue cheese and Roquefort. Cream, all kinds: half-and-half, light, heavy, or whipping. Sour cream or cream cheese (even if "light" or low-fat). Nondairy cream substitutes: coffee creamers and sour cream substitutes made with palm, palm kernel, hydrogenated oils, or coconut oil. Beverages  Allowed: Coffee (regular or decaffeinated), tea. Diet carbonated beverages, mineral water. Alcohol: Check with your caregiver. Moderation is recommended.  Avoid: Whole milk, regular sodas, and juice drinks with added sugar. Condiments  Allowed: All seasonings and condiments. Cocoa powder. "Cream" sauces made with recommended ingredients.  Avoid: Carob powder made with hydrogenated fats. SAMPLE MENU Breakfast   cup orange juice   cup oatmeal  1 slice toast  1 tsp margarine  1 cup skim milk Lunch  [place] sandwich with 2 oz [place], 2 slices bread  Lettuce and tomato slices  Fresh fruit  Carrot sticks  Coffee or tea Snack  Fresh fruit or low-fat crackers Dinner  3 oz lean ground beef  1 baked potato  1 tsp margarine   cup asparagus  Lettuce salad  1 tbs non-creamy dressing   cup peach slices  1 cup skim milk Document Released: 07/27/2008 Document Revised: 04/18/2012 Document Reviewed: 01/11/2012 Exit[employer] Patient Information 2014 [place], [place].

## 2014-04-01 NOTE — Progress Notes (Signed)
Physical Medicine and Rehabilitation Consult  Reason for Consult: Rhabdomyolysis/acute renal failure  Referring Physician: Dr. Brigitte Pulse  HPI: Carl Rios is a 80 y.o. right-handed male history of peripheral neuropathy. Presented 03/26/2014 after a fall and remained down for approximately 12 hours until found by family. Patient independent prior to admission living alone and driving. Noted total CK of 1550, troponin negative, white blood cell count 14,100 and BUN 27. Cranial CT scan with no evidence of intracranial abnormality noted chronic lacunar infarct at the right thalamus. The thoracic and lumbar spines negative for acute changes. Pelvis films without fracture. MRI of the brain revealed an acute right PCA infarct. Patient was placed on gentle IV fluids. Subcutaneous Lovenox added for DVT prophylaxis. Physical occupational therapy evaluations completed. Patient requiring Max assist to prevent falls in getting back to bed with loss of balance. Recommendations by occupational therapy are made for physical medicine rehabilitation consult.  Review of Systems  HENT: Positive for hearing loss.  Gastrointestinal:  GERD  Genitourinary: Positive for frequency.  Musculoskeletal: Positive for falls.  All other systems reviewed and are negative.   Past Medical History   Diagnosis  Date   .  AAA (abdominal aortic aneurysm) without rupture    .  Hyperlipidemia    .  Diverticulosis    .  GERD (gastroesophageal reflux disease)    .  Hx of adenomatous colonic polyps    .  Peripheral vascular disease    .  Obesity    .  Dyslipidemia    .  History of colon polyps    .  History of prostatitis    .  Hearing difficulty    .  Peripheral neuropathy     Past Surgical History   Procedure  Laterality  Date   .  Knee surgery       left knee in 1970's   .  Colonoscopy   01/24/2012     Procedure: COLONOSCOPY; Surgeon: Lafayette Dragon, MD; Location: [facility]; Service: Endoscopy; Laterality: N/A;   .  Inguinal  hernia repair     .  Cataract extraction  Bilateral    .  Tonsillectomy     .  Abdominal aortic endovascular stent graft  N/A  08/23/2013     Procedure: ABDOMINAL AORTIC ENDOVASCULAR STENT GRAFT; Surgeon: Mal Misty, MD; Location: [facility] OR; Service: Vascular; Laterality: N/A;   .  Abdominal aortic aneurysm repair      Family History   Problem  Relation  Age of Onset   .  Heart disease  Father  50     Heart diease before age 56    Social History: reports that he quit smoking about 55 years ago. He has never used smokeless tobacco. He reports that he does not drink alcohol or use illicit drugs.  Allergies:  Allergies   Allergen  Reactions   .  Sulfamethoxazole  Other (See Comments)     UNKNOWN    Medications Prior to Admission   Medication  Sig  Dispense  Refill   .  DULoxetine (CYMBALTA) 30 MG capsule  Take 1 capsule (30 mg total) by mouth daily.  90 capsule  1   .  OVER THE COUNTER MEDICATION  Take 1 tablet by mouth 2 (two) times daily. OTC supplement "Macular Protect Plus"      Home:  Home Living  Family/patient expects to be discharged to:: Private residence  Living Arrangements: Alone  Available Help at Discharge: Family  Type of  Home: House  Home Access: Stairs to enter  [employer] of Steps: 1  Home Layout: One level  Home Equipment: Lane - single point  Additional Comments: son has rolling walker at home and to find it prior to pt gong home  Functional History:  Prior Function  Level of Independence: Independent  Comments: drove  Functional Status:  Mobility:  Bed Mobility  Overal bed mobility: Needs Assistance  Bed Mobility: Supine to Sit;Sit to Supine  Supine to sit: Mod assist  Sit to supine: Mod assist  General bed mobility comments: Unable how to problem solve to get OOB. PT fell to L x 2 when trying to sit EOB  Transfers  Overall transfer level: Needs assistance  Equipment used: Rolling walker (2 wheeled)  Transfers: Sit to/from [employer]  Sit to Stand: Mod assist  Stand pivot transfers: Mod assist  General transfer comment: multiple vc to not pull up on RW when standing. Pt continued to pull up on walker. LOB x 3 to L.  Ambulation/Gait  Ambulation/Gait assistance: Min assist  Ambulation Distance (Feet): 80 Feet  Assistive device: Rolling walker (2 wheeled) (and chair following pt)  Gait Pattern/deviations: Step-through pattern;Shuffle;Trunk flexed  General Gait Details: lots of verbal cues to stay close to walker and to look up while walking. pt had hard time stearing the RW - wanted to steer to right.   ADL:  ADL  Overall ADL's : Needs assistance/impaired  Eating/Feeding: Set up  Grooming: Minimal assistance;Cueing for safety  Upper Body Bathing: Min guard;Sitting  Lower Body Bathing: Moderate assistance;Sit to/from stand  Lower Body Dressing: Moderate assistance;Sit to/from stand  Toilet Transfer: Moderate assistance (simulated)  Functional mobility during ADLs: Moderate assistance;Cueing for safety;Cueing for sequencing (Pt walking through RW when going back to bed. Pt required Ma)  General ADL Comments: Pt overall mod A with ADL and extremely high risk of falls. Poor awareness of deficits during ADL task. Postural control affected by attentional deficits.  Cognition:  Cognition  Overall Cognitive Status: Impaired/Different from baseline (cno family available to determine baseline funciton)  Orientation Level: Oriented to person;Oriented to time;Disoriented to place;Disoriented to situation  Cognition  Arousal/Alertness: Awake/alert  Behavior During Therapy: WFL for tasks assessed/performed  Overall Cognitive Status: Impaired/Different from baseline (cno family available to determine baseline funciton)  Area of Impairment: Safety/judgement;Awareness;Problem solving  Memory: Decreased short-term memory  Safety/Judgement: Decreased awareness of safety;Decreased awareness of deficits  Awareness:  Emergent  Problem Solving: Slow processing;Decreased initiation;Difficulty sequencing;Requires verbal cues  General Comments: Pt standing at sink to wash hands. Difficulty with depth perception to grab towel with L hand. "grabbing air". Pt leaning to L. Used RW to walk to sinkg. Began trying to walk through RW to turn andgo back to bed. Attentional deficits  Blood pressure 149/66, pulse 78, temperature 97.8 F (36.6 C), temperature source Oral, resp. rate 22, height 5\' 8"  (1.727 m), weight 89.813 kg (198 lb), SpO2 96.00%.  Physical Exam  HENT:  Head: Normocephalic.  Eyes: EOM are normal.  Neck: Normal range of motion. Neck supple. No thyromegaly present.  Cardiovascular: Normal rate and regular rhythm.  Respiratory: Effort normal and breath sounds normal. No respiratory distress.  GI: Soft. Bowel sounds are normal. He exhibits no distension.  Neurological: He is alert.  Patient is very hard of hearing. Mild left facial droop. Decreased FMC LUE and LLE more than right side. Strength nearly symmetrical. LE's 3/5 HF, 3+/5, 4/5 ankles. He is appropriate to person, place, date of  birth and age. He follows full commands.  Skin: Skin is warm.  Abrasions throughout the left leg.  Psychiatric: He has a normal mood and affect.   No results found for this or any previous visit (from the past 24 hour(s)).  No results found.  Assessment/Plan:  Diagnosis: right PCA infarct, rhabdomyolysis  1. Does the need for close, 24 hr/day medical supervision in concert with the patient's rehab needs make it unreasonable for this patient to be served in a less intensive setting? Yes 2. Co-Morbidities requiring supervision/potential complications: acute renal failure, GERD 3. Due to bladder management, bowel management, safety, skin/wound care, disease management, medication administration, pain management and patient education, does the patient require 24 hr/day rehab nursing? Yes 4. Does the patient require  coordinated care of a physician, rehab nurse, PT (1-2 hrs/day, 5 days/week), OT (1-2 hrs/day, 5 days/week) and SLP (1-2 hrs/day, 5 days/week) to address physical and functional deficits in the context of the above medical diagnosis(es)? Yes Addressing deficits in the following areas: balance, endurance, locomotion, strength, transferring, bowel/bladder control, bathing, dressing, feeding, grooming, toileting, cognition, speech and psychosocial support 5. Can the patient actively participate in an intensive therapy program of at least 3 hrs of therapy per day at least 5 days per week? Yes 6. The potential for patient to make measurable gains while on inpatient rehab is excellent 7. Anticipated functional outcomes upon discharge from inpatient rehab are supervision and min assist with PT, supervision and min assist with OT, modified independent and supervision with SLP. 8. Estimated rehab length of stay to reach the above functional goals is: 17-24 days 9. Does the patient have adequate social supports to accommodate these discharge functional goals? Yes 10. Anticipated D/C setting: Home 11. Anticipated post D/C treatments: HH therapy and Outpatient therapy 12. Overall Rehab/Functional Prognosis: excellent RECOMMENDATIONS:  This patient's condition is appropriate for continued rehabilitative care in the following setting: CIR  Patient has agreed to participate in recommended program. Yes  Note that insurance prior authorization may be required for reimbursement for recommended care.  Comment: Rehab Admissions Coordinator to follow up.  Thanks,  Meredith Staggers, MD, Mellody Drown  03/29/2014  Revision History...      Date/Time User Action    03/29/2014 2:10 PM Meredith Staggers, MD Sign    03/29/2014 6:20 AM Cathlyn Parsons, PA-C [place]   View Details Report    Routing History.Marland KitchenMarland Kitchen

## 2014-04-02 ENCOUNTER — Inpatient Hospital Stay (HOSPITAL_COMMUNITY): Payer: Medicare Other | Admitting: *Deleted

## 2014-04-02 ENCOUNTER — Inpatient Hospital Stay (HOSPITAL_COMMUNITY): Payer: Medicare Other | Admitting: Speech Pathology

## 2014-04-02 ENCOUNTER — Inpatient Hospital Stay (HOSPITAL_COMMUNITY): Payer: Medicare Other | Admitting: Occupational Therapy

## 2014-04-02 DIAGNOSIS — M6282 Rhabdomyolysis: Secondary | ICD-10-CM

## 2014-04-02 DIAGNOSIS — I10 Essential (primary) hypertension: Secondary | ICD-10-CM

## 2014-04-02 DIAGNOSIS — I6330 Cerebral infarction due to thrombosis of unspecified cerebral artery: Secondary | ICD-10-CM

## 2014-04-02 DIAGNOSIS — R269 Unspecified abnormalities of gait and mobility: Secondary | ICD-10-CM

## 2014-04-02 LAB — CBC WITH DIFFERENTIAL/PLATELET
BASOS PCT: 0 % (ref 0–1)
Basophils Absolute: 0 10*3/uL (ref 0.0–0.1)
Eosinophils Absolute: 0.2 10*3/uL (ref 0.0–0.7)
Eosinophils Relative: 3 % (ref 0–5)
HCT: 42.7 % (ref 39.0–52.0)
Hemoglobin: 14.1 g/dL (ref 13.0–17.0)
LYMPHS PCT: 19 % (ref 12–46)
Lymphs Abs: 1.7 10*3/uL (ref 0.7–4.0)
MCH: 29.8 pg (ref 26.0–34.0)
MCHC: 33 g/dL (ref 30.0–36.0)
MCV: 90.3 fL (ref 78.0–100.0)
Monocytes Absolute: 0.5 10*3/uL (ref 0.1–1.0)
Monocytes Relative: 5 % (ref 3–12)
NEUTROS ABS: 6.5 10*3/uL (ref 1.7–7.7)
NEUTROS PCT: 73 % (ref 43–77)
PLATELETS: 294 10*3/uL (ref 150–400)
RBC: 4.73 MIL/uL (ref 4.22–5.81)
RDW: 15 % (ref 11.5–15.5)
WBC: 9 10*3/uL (ref 4.0–10.5)

## 2014-04-02 LAB — URINALYSIS, ROUTINE W REFLEX MICROSCOPIC
Bilirubin Urine: NEGATIVE
GLUCOSE, UA: NEGATIVE mg/dL
KETONES UR: NEGATIVE mg/dL
Nitrite: POSITIVE — AB
PH: 5.5 (ref 5.0–8.0)
Protein, ur: NEGATIVE mg/dL
Specific Gravity, Urine: 1.02 (ref 1.005–1.030)
Urobilinogen, UA: 0.2 mg/dL (ref 0.0–1.0)

## 2014-04-02 LAB — COMPREHENSIVE METABOLIC PANEL
ALBUMIN: 3.2 g/dL — AB (ref 3.5–5.2)
ALT: 26 U/L (ref 0–53)
AST: 32 U/L (ref 0–37)
Alkaline Phosphatase: 87 U/L (ref 39–117)
BILIRUBIN TOTAL: 0.7 mg/dL (ref 0.3–1.2)
BUN: 18 mg/dL (ref 6–23)
CHLORIDE: 102 meq/L (ref 96–112)
CO2: 26 meq/L (ref 19–32)
Calcium: 9.3 mg/dL (ref 8.4–10.5)
Creatinine, Ser: 0.87 mg/dL (ref 0.50–1.35)
GFR calc Af Amer: 88 mL/min — ABNORMAL LOW (ref >90)
GFR calc non Af Amer: 76 mL/min — ABNORMAL LOW (ref >90)
Glucose, Bld: 113 mg/dL — ABNORMAL HIGH (ref 70–99)
POTASSIUM: 5.3 meq/L (ref 3.7–5.3)
SODIUM: 139 meq/L (ref 137–147)
Total Protein: 6.3 g/dL (ref 6.0–8.3)

## 2014-04-02 LAB — URINE MICROSCOPIC-ADD ON

## 2014-04-02 NOTE — Evaluation (Signed)
Occupational Therapy Assessment and Plan  Patient Details  Name: Carl Rios MRN: [id number] Date of Birth: Nov 13, 1925  OT Diagnosis: abnormal posture, cognitive deficits, hemiplegia affecting non-dominant side and muscle weakness (generalized) Rehab Potential: Rehab Potential: Good ELOS: 14-16 days   Today's Date: 04/02/2014 Time: 800-900  Time Calculation (min): 60 min  Problem List:  Patient Active Problem List   Diagnosis Date Noted  . Recurrent falls 04/01/2014  . Paroxysmal SVT (supraventricular tachycardia) 04/01/2014  . Unsteady gait 04/01/2014  . CVA (cerebral infarction) 03/31/2014  . Acute renal failure 03/27/2014  . Dehydration 03/27/2014  . Rhabdomyolysis 03/26/2014  . AAA (abdominal aortic aneurysm) without rupture 08/14/2013  . Abnormality of gait 01/08/2013  . Other general symptoms(780.99) 01/08/2013  . Other vitamin B12 deficiency anemia 01/08/2013  . Polyneuropathy in other diseases classified elsewhere 01/08/2013  . Abdominal aneurysm without mention of rupture 02/01/2012  . Hypokalemia 01/25/2012  . Anemia due to acute blood loss 01/24/2012  . Benign neoplasm of colon 01/24/2012  . Hypertension 01/19/2012  . Other and unspecified hyperlipidemia 01/18/2012  . Impaired fasting glucose 01/18/2012  . Unspecified hereditary and idiopathic peripheral neuropathy 01/18/2012  . Hypertonicity of bladder 01/18/2012  . Diverticulosis 01/18/2012  . Rectal bleeding 01/18/2012  . GERD 02/09/2010  . COLONIC POLYPS, ADENOMATOUS, HX OF 02/09/2010    Past Medical History:  Past Medical History  Diagnosis Date  . AAA (abdominal aortic aneurysm) without rupture   . Hyperlipidemia   . Diverticulosis   . GERD (gastroesophageal reflux disease)   . Hx of adenomatous colonic polyps   . Peripheral vascular disease   . Obesity   . Dyslipidemia   . History of colon polyps   . History of prostatitis   . Hearing difficulty   . Peripheral neuropathy    Past Surgical  History:  Past Surgical History  Procedure Laterality Date  . Knee surgery      left knee in 1970's  . Colonoscopy  01/24/2012    Procedure: COLONOSCOPY;  Surgeon: Lafayette Dragon, MD;  Location: [facility];  Service: Endoscopy;  Laterality: N/A;  . Inguinal hernia repair    . Cataract extraction Bilateral   . Tonsillectomy    . Abdominal aortic endovascular stent graft N/A 08/23/2013    Procedure: ABDOMINAL AORTIC ENDOVASCULAR STENT GRAFT;  Surgeon: Mal Misty, MD;  Location: [facility] OR;  Service: Vascular;  Laterality: N/A;  . Abdominal aortic aneurysm repair      Assessment & Plan Clinical Impression:  Carl Rios is a 80 y.o. right-handed male history of peripheral neuropathy. Presented 03/26/2014 after a fall and remained down for approximately 12 hours until found by family. Patient independent prior to admission living alone and driving. Noted total CK of 1550, troponin negative, white blood cell count 14,100 and BUN 27. Cranial CT scan with no evidence of intracranial abnormality noted chronic lacunar infarct at the right thalamus. Patient did not receive TPA. The thoracic and lumbar spines negative for acute changes. Pelvis films without fracture. MRI of the brain revealed an acute right PCA infarct. Carotid Dopplers with no ICA stenosis. Echocardiogram with ejection fraction of 61% grade 1 diastolic dysfunction. Maintained on aspirin for CVA prophylaxis. Patient was placed on gentle IV fluids with latest BUN and creatinine within normal limits. Total CK again within normal limits at 63. Subcutaneous Lovenox added for DVT prophylaxis. He is tolerating a mechanical soft diet. Physical occupational therapy evaluations completed. Patient requiring Max assist to prevent falls in getting back  to bed with loss of balance. Recommendations by physical and occupational therapy are made for physical medicine rehabilitation consult. Patient was admitted for comprehensive rehabilitation program Patient  transferred to CIR on 04/01/2014 .    Patient currently requires min with basic self-care skills secondary to muscle weakness, decreased cardiorespiratoy endurance, decreased coordination, decreased attention to left, decreased problem solving, decreased memory and delayed processing, central origin and decreased sitting balance, decreased standing balance, decreased postural control and hemiplegia.  Prior to hospitalization, patient was living alone independently, often ate large meals out, and driving.  Patient will benefit from skilled intervention to increase independence with basic self-care skills prior to discharge home with care partner.  Anticipate patient will require 24 hour supervision and follow up home health.  OT - End of Session Activity Tolerance: Tolerates 10 - 20 min activity with multiple rests Endurance Deficit: Yes Endurance Deficit Description: Pt needing rest break after 50mn standing OT Assessment Rehab Potential: Good OT Patient demonstrates impairments in the following area(s): Balance;Cognition;Endurance;Motor;Perception;Safety OT Basic ADL's Functional Problem(s): Grooming;Bathing;Dressing;Toileting OT Advanced ADL's Functional Problem(s): Simple Meal Preparation;Light Housekeeping OT Transfers Functional Problem(s): Toilet;Tub/Shower OT Additional Impairment(s): Fuctional Use of Upper Extremity OT Plan OT Intensity: Minimum of 1-2 x/day, 45 to 90 minutes OT Frequency: 5 out of 7 days OT Duration/Estimated Length of Stay: 14-16 days OT Treatment/Interventions: Balance/vestibular training;Cognitive remediation/compensation;Discharge planning;DME/adaptive equipment instruction;Community reintegration;Functional mobility training;Neuromuscular re-education;Patient/family education;Psychosocial support;Self Care/advanced ADL retraining;Therapeutic Exercise;Therapeutic Activities;UE/LE Strength taining/ROM;UE/LE Coordination activities;Visual/perceptual  remediation/compensation OT Self Feeding Anticipated Outcome(s): I OT Basic Self-Care Anticipated Outcome(s): mod I  OT Toileting Anticipated Outcome(s): mod I OT Bathroom Transfers Anticipated Outcome(s): supervision to shower, mod I to toilet OT Recommendation Patient destination: Home Follow Up Recommendations: Home health OT Equipment Recommended: 3 in 1 bedside comode;Tub/shower seat Equipment Details: Equipment will be provided by patient's son.   Skilled Therapeutic Intervention Pt seen for initial evaluation and BADL retraining of toileting, bathing, and dressing. Purpose of OT and OT goals explained to pt. Pt is very HOH and needs extra processing time. He is aware that he has impaired balance with dizziness at times when he first sits up and stands up.  Pt was aware he needed to have bowel movement. Ambulated to toilet with RW with mod A due to lean to left and posterior lean intermittently.  Pt did not demonstrate any perceptual deficits and apraxia during his self care. He was aware of time frame of therapy session and worked hard to get everything accomplished. Stood at sink to brush teeth with steady A to stand. Pt's PT had arrived for his next session.  OT Evaluation Precautions/Restrictions  Precautions Precautions: Fall Precaution Comments: HOH Restrictions Weight Bearing Restrictions: No   Vital Signs Therapy Vitals Pulse Rate: 89 BP: 149/99 mmHg Patient Position (if appropriate): Sitting Oxygen Therapy SpO2: 94 % Pain Pain Assessment Pain Assessment: No/denies pain Pain Score: 0-No pain Home Living/Prior Functioning Home Living Family/patient expects to be discharged to:: Private residence Living Arrangements: Alone Available Help at Discharge: Family;Available PRN/intermittently Type of Home: House Home Access: Stairs to enter [employer] Steps: 1-3 Entrance Stairs-Rails:  (Need to confirm) Home Layout: One level Additional Comments: Son  can provide DME  Lives With: Alone IADL History Education: mConservator, 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 Prior Function Level of Independence: Independent with transfers;Independent with gait;Independent with basic ADLs;Other (comment) (Pt eats most main meals out and plans to hire cleaning)  Able to Take Stairs?: Yes Driving: Yes Vocation: Retired ADL  ADL ADL Comments: Refer to FIM Vision/Perception  Vision- History Baseline Vision/History: Wears glasses Wears Glasses: Reading only;Distance only Patient Visual Report: No change from baseline Vision- Assessment Eye Alignment: Within Functional Limits Additional Comments: Tracking WFL Perception Perception: Within Functional Limits Praxis Praxis: Intact  Cognition Overall Cognitive Status: Impaired/Different from baseline Arousal/Alertness: Awake/alert Orientation Level: Oriented X4 Attention: Selective Selective Attention: Appears intact Memory: Impaired Memory Impairment: Prospective memory;Decreased recall of new information Awareness: Impaired Awareness Impairment: Emergent impairment;Intellectual impairment Problem Solving: Impaired Problem Solving Impairment: Functional basic Executive Function: Organizing;Self Monitoring;Self Correcting Organizing: Impaired Organizing Impairment: Functional basic Self Monitoring: Impaired Self Monitoring Impairment: Functional basic Self Correcting: Impaired Self Correcting Impairment: Functional basic Behaviors: Impulsive Safety/Judgment: Impaired Comments: L-inattention, slightly impulsive  Sensation Sensation Light Touch: Appears Intact Stereognosis: Appears Intact Hot/Cold: Appears Intact Proprioception: Appears Intact Additional Comments: Pt reports longstanding tingling from neuropathy Coordination Gross Motor Movements are Fluid and Coordinated: No Fine Motor Movements are Fluid and Coordinated: No Coordination and Movement Description: Decreased  accuracy and excursion for [facility] with buttons, decreased fluidity wtih LE movements during gait and transfers, complicated by weakness Finger Nose Finger Test: decreased accuracy on left Heel Shin Test: Decreased timing Motor  Motor Motor: Hemiplegia;Abnormal postural alignment and control Motor - Skilled Clinical Observations: Pt with L-sided weakness, reporting L UE feels "heavy." Pt unable to hold trunk steady in unsupported standing, vascillating betwwen lateral and AP sways, jerking about to gain balance Mobility    refer to FIM Trunk/Postural Assessment  Cervical Assessment Cervical Assessment: Within Functional Limits Thoracic Assessment Thoracic Assessment: Within Functional Limits Lumbar Assessment Lumbar Assessment: Exceptions to [facility] (Decreased pelvic mobility, posterior tilt) Postural Control Postural Control: Deficits on evaluation Trunk Control: Decreased in standing, with increased sway in all directions without challenge Righting Reactions: Jerky and insufficient, needing to reach out for support Protective Responses: Relies on UEs to catch balance  Balance Balance Balance Assessed: Yes Standardized Balance Assessment Standardized Balance Assessment: Berg Balance Test Berg Balance Test Sit to Stand: Needs minimal aid to stand or to stabilize Standing Unsupported: Able to stand 2 minutes with supervision Sitting with Back Unsupported but Feet Supported on Floor or Stool: Able to sit safely and securely 2 minutes Stand to Sit: Uses backs of legs against chair to control descent Transfers: Needs one person to assist Static Sitting Balance Static Sitting - Level of Assistance: 7: Independent Dynamic Sitting Balance Dynamic Sitting - Level of Assistance: 4: Min assist Static Standing Balance Static Standing - Level of Assistance: 4: Min assist Dynamic Standing Balance Dynamic Standing - Level of Assistance: 3: Mod assist Extremity/Trunk Assessment RUE Assessment RUE  Assessment: Within Functional Limits LUE Assessment LUE Assessment: Exceptions to [facility] (Pt reports "heavy," defer to OT note) LUE Strength LUE Overall Strength Comments: 3+/5 throughout LUE  FIM:  FIM - Eating Eating Activity: 6: More than reasonable amount of time FIM - Grooming Grooming Steps: Wash, rinse, dry face;Wash, rinse, dry hands;Oral care, brush teeth, clean dentures;Brush, comb hair Grooming: 4: Steadying assist  or patient completes 3 of 4 or 4 of 5 steps FIM - Bathing Bathing Steps Patient Completed: Chest;Right Arm;Left Arm;Buttocks;Front perineal area;Abdomen;Right upper leg;Left upper leg;Right lower leg (including foot);Left lower leg (including foot) Bathing: 4: Steadying assist FIM - Upper Body Dressing/Undressing Upper body dressing/undressing steps patient completed: Thread/unthread right sleeve of pullover shirt/dresss;Thread/unthread left sleeve of pullover shirt/dress;Put head through opening of pull over shirt/dress;Pull shirt over trunk Upper body dressing/undressing: 5: Set-up assist to: Obtain clothing/put away FIM - Lower  Body Dressing/Undressing Lower body dressing/undressing steps patient completed: Thread/unthread right pants leg;Thread/unthread left pants leg;Pull pants up/down;Fasten/unfasten pants;Don/Doff right shoe;Don/Doff left shoe Lower body dressing/undressing: 4: Steadying Assist FIM - Toileting Toileting steps completed by patient: Adjust clothing prior to toileting;Performs perineal hygiene;Adjust clothing after toileting Toileting: 4: Steadying assist FIM - Engineer, site Assistive Devices: Arm rests Bed/Chair Transfer: 3: Sit > Supine: Mod A (lifting assist/Pt. 50-74%/lift 2 legs);4: Bed > Chair or W/C: Min A (steadying Pt. > 75%) FIM - Radio producer Devices: Elevated toilet seat;Grab bars Toilet Transfers: 3-To toilet/BSC: Mod A (lift or lower assist) FIM - Tub/Shower  Transfers Tub/Shower Assistive Devices: Tub transfer bench;Walk in shower;Walker;Grab bars Tub/shower Transfers: 3-Into Tub/Shower: Mod A (lift or lower/lift 2 legs);3-Out of Tub/Shower: Mod A (lift or lower/lift 2 legs)   Refer to Care Plan for Long Term Goals  Recommendations for other services: None  Discharge Criteria: Patient will be discharged from OT if patient refuses treatment 3 consecutive times without medical reason, if treatment goals not met, if there is a change in medical status, if patient makes no progress towards goals or if patient is discharged from hospital.  The above assessment, treatment plan, treatment alternatives and goals were discussed and mutually agreed upon: by patient  Harlene Ramus 04/02/2014, 11:31 AM

## 2014-04-02 NOTE — Progress Notes (Signed)
Social Work Assessment and Plan Social Work Assessment and Plan  Patient Details  Name: Carl Rios MRN: [id number] Date of Birth: 01-Mar-1926  Today's Date: 04/02/2014  Problem List:  Patient Active Problem List   Diagnosis Date Noted  . Recurrent falls 04/01/2014  . Paroxysmal SVT (supraventricular tachycardia) 04/01/2014  . Unsteady gait 04/01/2014  . CVA (cerebral infarction) 03/31/2014  . Acute renal failure 03/27/2014  . Dehydration 03/27/2014  . Rhabdomyolysis 03/26/2014  . AAA (abdominal aortic aneurysm) without rupture 08/14/2013  . Abnormality of gait 01/08/2013  . Other general symptoms(780.99) 01/08/2013  . Other vitamin B12 deficiency anemia 01/08/2013  . Polyneuropathy in other diseases classified elsewhere 01/08/2013  . Abdominal aneurysm without mention of rupture 02/01/2012  . Hypokalemia 01/25/2012  . Anemia due to acute blood loss 01/24/2012  . Benign neoplasm of colon 01/24/2012  . Hypertension 01/19/2012  . Other and unspecified hyperlipidemia 01/18/2012  . Impaired fasting glucose 01/18/2012  . Unspecified hereditary and idiopathic peripheral neuropathy 01/18/2012  . Hypertonicity of bladder 01/18/2012  . Diverticulosis 01/18/2012  . Rectal bleeding 01/18/2012  . GERD 02/09/2010  . COLONIC POLYPS, ADENOMATOUS, HX OF 02/09/2010   Past Medical History:  Past Medical History  Diagnosis Date  . AAA (abdominal aortic aneurysm) without rupture   . Hyperlipidemia   . Diverticulosis   . GERD (gastroesophageal reflux disease)   . Hx of adenomatous colonic polyps   . Peripheral vascular disease   . Obesity   . Dyslipidemia   . History of colon polyps   . History of prostatitis   . Hearing difficulty   . Peripheral neuropathy    Past Surgical History:  Past Surgical History  Procedure Laterality Date  . Knee surgery      left knee in 1970's  . Colonoscopy  01/24/2012    Procedure: COLONOSCOPY;  Surgeon: Lafayette Dragon, MD;  Location: [facility];   Service: Endoscopy;  Laterality: N/A;  . Inguinal hernia repair    . Cataract extraction Bilateral   . Tonsillectomy    . Abdominal aortic endovascular stent graft N/A 08/23/2013    Procedure: ABDOMINAL AORTIC ENDOVASCULAR STENT GRAFT;  Surgeon: Mal Misty, MD;  Location: [place];  Service: Vascular;  Laterality: N/A;  . Abdominal aortic aneurysm repair     Social History:  reports that he quit smoking about 55 years ago. He has never used smokeless tobacco. He reports that he does not drink alcohol or use illicit drugs.  Family / Support Systems Marital Status: Widow/Widower How Long?: 4 months Patient Roles: Parent Children: [name]  [phone] Other Supports: Andy-son  [phone]   Catherine-daughter  [phone] Anticipated Caregiver: Unsure at this time-Rod can check on him but works during the day Ability/Limitations of Caregiver: Children all work and one is local-Rod Careers adviser: Other (Comment) (Coming up with a plan depending upon progress) Family Dynamics: Close knit family all three children are involved and supportive of pt.  Pt feels fortuanate to have them looking out for him.  Social History Preferred language: English Religion: Christian Cultural Background: No issues Education: Masters Guardian Life Insurance Read: Yes Write: Yes Employment Status: Retired Freight forwarder Issues: No issues Guardian/Conservator: None-according to MD pt is capable of making his own decisions while here.  Pt wants his son involved also.   Abuse/Neglect Physical Abuse: Denies Verbal Abuse: Denies Sexual Abuse: Denies Exploitation of patient/patient's resources: Denies Self-Neglect: Denies  Emotional Status Pt's affect, behavior adn adjustment status: Pt is very HOH but watches lips and  is able to follow the conversation.  He has always been independent and wants to remain so.  He was able to take care of himself and lived alone along with driving.  His son  would come by and check on him daily.  He is aware the team may recommend 24 hr supevision and is open to the alternatives out there. Recent Psychosocial Issues: Other health issues-was in [place] for his balance issues four months ago, felt it helped but then they came back once stopped going to therapies. Pyschiatric History: No issues-he feels he is doing ok with all that is going on, felt depression screen was not needed.  Will monitor and intervene if team feels necessary or this worker feels necessary.  He reports with the recent loss of his wife he states: " She wanted to go, was ready." Substance Abuse History: No issues  Patient / Family Perceptions, Expectations & Goals Pt/Family understanding of illness & functional limitations: Pt and son have a good understanding of his stroke and deficits.  His balance has been affected and he is aware of this.  He can see the progress and is pleased with this and hopeful it will continue.  His son is looking at alternatives for his Dad and wants to be sure he is safe when discharged. Premorbid pt/family roles/activities: Widower, Father, grandfather, Church member, etc Anticipated changes in roles/activities/participation: resume Pt/family expectations/goals: Pt states; " I want to be as independent as I can get, I notice my balance issues."  Son states: " I hope he does well here and am glad he got to come to rehab."  [employer]: Other (Comment) (Had OP therapies in the past) Premorbid Home Care/DME Agencies: None Transportation available at discharge: Son, pt was driving himself Resource referrals recommended: Support group (specify) ([facility]A Support group & Grief support group)  Discharge Planning Living Arrangements: Alone Support Systems: Children;Friends/neighbors;Other relatives;Church/faith community Type of Residence: Private residence Insurance Resources: Education officer, museum (specify) Nurse, mental health) Financial  Resources: Fish farm manager;Other (Comment) (pension) Financial Screen Referred: No Living Expenses: Own Money Management: Patient Does the patient have any problems obtaining your medications?: No Home Management: Patient Patient/Family Preliminary Plans: Unclear at this time, depends upon pt's progress and if he would be safe to be at home, aware of the optiosn of private duty caregivers and assisted living facilities.  Will await team's goals and work on a safe discharge plan with pt and son. Pt is aware the team will not recommend being home alone but 24 hr supervision for safety. Social Work Anticipated Follow Up Needs: HH/OP;ALF/IL;Support Group  Clinical Impression Pleasant motivated gentleman who is willing to listen to the team and do what is needed to be safe.  He is open to options and is working with his son's on the plan when he leaves here. Will await team's recommendations and work on a safe discharge plan, probably will be at the very least 24 hour supervision due to balance issues and needing assist with home mangement. Aware team conference tomorrow.  Gardiner Rhyme Marcelis Wissner 04/02/2014, 3:06 PM

## 2014-04-02 NOTE — Progress Notes (Signed)
Patient information reviewed and entered into eRehab system by Lean Jaeger, RN, CRRN, PPS Coordinator.  Information including medical coding and functional independence measure will be reviewed and updated through discharge.     Per nursing patient was given "Data Collection Information Summary for Patients in Inpatient Rehabilitation Facilities with attached "Privacy Act Statement-Health Care Records" upon admission.  

## 2014-04-02 NOTE — Evaluation (Signed)
Physical Therapy Assessment and Plan  Patient Details  Name: Carl Rios MRN: [id number] Date of Birth: 04-06-1926  PT Diagnosis: Abnormal posture, Abnormality of gait, Coordination disorder, Hemiparesis non-dominant, Impaired sensation and Muscle weakness Rehab Potential: Good ELOS: 10-14 days   Today's Date: 04/02/2014 Time: 0902-1000 and 14:05-14:30 ( )  Time Calculation (min): 58 min  Problem List:  Patient Active Problem List   Diagnosis Date Noted  . Recurrent falls 04/01/2014  . Paroxysmal SVT (supraventricular tachycardia) 04/01/2014  . Unsteady gait 04/01/2014  . CVA (cerebral infarction) 03/31/2014  . Acute renal failure 03/27/2014  . Dehydration 03/27/2014  . Rhabdomyolysis 03/26/2014  . AAA (abdominal aortic aneurysm) without rupture 08/14/2013  . Abnormality of gait 01/08/2013  . Other general symptoms(780.99) 01/08/2013  . Other vitamin B12 deficiency anemia 01/08/2013  . Polyneuropathy in other diseases classified elsewhere 01/08/2013  . Abdominal aneurysm without mention of rupture 02/01/2012  . Hypokalemia 01/25/2012  . Anemia due to acute blood loss 01/24/2012  . Benign neoplasm of colon 01/24/2012  . Hypertension 01/19/2012  . Other and unspecified hyperlipidemia 01/18/2012  . Impaired fasting glucose 01/18/2012  . Unspecified hereditary and idiopathic peripheral neuropathy 01/18/2012  . Hypertonicity of bladder 01/18/2012  . Diverticulosis 01/18/2012  . Rectal bleeding 01/18/2012  . GERD 02/09/2010  . COLONIC POLYPS, ADENOMATOUS, HX OF 02/09/2010    Past Medical History:  Past Medical History  Diagnosis Date  . AAA (abdominal aortic aneurysm) without rupture   . Hyperlipidemia   . Diverticulosis   . GERD (gastroesophageal reflux disease)   . Hx of adenomatous colonic polyps   . Peripheral vascular disease   . Obesity   . Dyslipidemia   . History of colon polyps   . History of prostatitis   . Hearing difficulty   . Peripheral neuropathy     Past Surgical History:  Past Surgical History  Procedure Laterality Date  . Knee surgery      left knee in 1970's  . Colonoscopy  01/24/2012    Procedure: COLONOSCOPY;  Surgeon: Hart Carwin, MD;  Location: [facility];  Service: Endoscopy;  Laterality: N/A;  . Inguinal hernia repair    . Cataract extraction Bilateral   . Tonsillectomy    . Abdominal aortic endovascular stent graft N/A 08/23/2013    Procedure: ABDOMINAL AORTIC ENDOVASCULAR STENT GRAFT;  Surgeon: Pryor Ochoa, MD;  Location: [facility] OR;  Service: Vascular;  Laterality: N/A;  . Abdominal aortic aneurysm repair      Assessment & Plan Clinical Impression: Carl Rios is a 80 y.o. right-handed male history of peripheral neuropathy. Presented 03/26/2014 after a fall and remained down for approximately 12 hours until found by family. Patient independent prior to admission living alone and driving. Noted total CK of 1550, troponin negative, white blood cell count 14,100 and BUN 27. Cranial CT scan with no evidence of intracranial abnormality noted chronic lacunar infarct at the right thalamus. Patient did not receive TPA. The thoracic and lumbar spines negative for acute changes. Pelvis films without fracture. MRI of the brain revealed an acute right PCA infarct. Carotid Dopplers with no ICA stenosis. Echocardiogram with ejection fraction of 60% grade 1 diastolic dysfunction. Maintained on aspirin for CVA prophylaxis. Patient was placed on gentle IV fluids with latest BUN and creatinine within normal limits. Total CK again within normal limits at 63. Subcutaneous Lovenox added for DVT prophylaxis. He is tolerating a mechanical soft diet. Physical occupational therapy evaluations completed. Patient requiring Max assist to prevent falls in  getting back to bed with loss of balance. Recommendations by physical and occupational therapy are made for physical medicine rehabilitation consult. Patient was admitted for comprehensive  rehabilitation program   Patient transferred to CIR on 04/01/2014 .   Patient currently requires mod with mobility secondary to muscle weakness, decreased cardiorespiratoy endurance, impaired timing and sequencing and decreased coordination, decreased attention to left and decreased standing balance, hemiplegia and decreased balance strategies.  Prior to hospitalization, patient was independent  with mobility and lived with Alone in a House home.  Home access is 1-3Stairs to enter.  Patient will benefit from skilled PT intervention to maximize safe functional mobility, minimize fall risk and decrease caregiver burden for planned discharge home with 24 hour supervision or assisted living situation if safety indicates.   Anticipate patient will benefit from follow up [place] at discharge.  PT - End of Session Activity Tolerance: Tolerates 10 - 20 min activity with multiple rests Endurance Deficit: Yes Endurance Deficit Description: Pt needing rest break after 67mn standing PT Assessment Rehab Potential: Good Barriers to Discharge: Other (comment) (Need to confirm caregier availability and stairs with son) PT Patient demonstrates impairments in the following area(s): Balance;Endurance;Motor;Perception;Pain;Safety;Sensory PT Transfers Functional Problem(s): Bed Mobility;Bed to Chair;Car;Furniture;Floor PT Locomotion Functional Problem(s): Ambulation;Wheelchair Mobility;Stairs PT Plan PT Intensity: Minimum of 1-2 x/day ,45 to 90 minutes PT Frequency: 5 out of 7 days PT Duration Estimated Length of Stay: 10-14 days PT Treatment/Interventions: Ambulation/gait training;Balance/vestibular training;Cognitive remediation/compensation;Community reintegration;Discharge planning;Disease management/prevention;DME/adaptive equipment instruction;Functional mobility training;Neuromuscular re-education;Pain management;Patient/family education;Psychosocial support;Stair training;Therapeutic Activities;Therapeutic  Exercise;UE/LE Strength taining/ROM;UE/LE Coordination activities;Visual/perceptual remediation/compensation;Wheelchair propulsion/positioning PT Transfers Anticipated Outcome(s): Modified independent basic transfers, Supervision car  PT Locomotion Anticipated Outcome(s): Supervision x150' with LRAD, S/min A stairs PT Recommendation Recommendations for Other Services: Neuropsych consult Follow Up Recommendations: Home health PT;24 hour supervision/assistance Patient destination: Home Equipment Recommended: Rolling walker with 5" wheels Equipment Details: Pt likely to benefit from RW for fall risk reduction  Skilled Therapeutic Intervention Tx initiated at eval for functional mobility training. Educated pt on transfer safety and began BChartered loss adjuster However, pt needing to use bathroom mid-test, so transitioned to toilet transfers and will continue test later today. Pt noted to have L-sided inattention during WC propulsion.  Pt needing cues for transfer technique and hand placement. Pt's movements marked by posterior lean as well as seeking supoprt with UEs in all tasks. During Berg, pt had posterior fall to mat upon closing eyes.  Pt left in bathroom with nurse present.  ------------------------------------------------ Second tx focused on functional mobility and completion of Berg balance test.  Pt seemed to have increased fatigue and decreased balance this afternoon.   Berg score 14/56, indicating increased risk of falls. Pt educated on findings and functional implications. Pt had most difficulty with activities involving narrow BOS and with SLS components. Pt continues to rely on UEs to catch balance, with delayed and insufficient righting reactions.   Pt needing to use bathroom urgently, needing assist for wet diaper as well. Performed gait training in room and on unit with RW and solid Mod A for balance with LOB to R and L, colliding with obstacles on R side this afternoon.   Pt  wanting to rest in bed, needing min A to supine and assist to adjust in bed. Bed alarm on.   PT Evaluation Precautions/Restrictions Precautions Precautions: Fall Precaution Comments: HOH Restrictions Weight Bearing Restrictions: No General   Vital SignsTherapy Vitals Pulse Rate: 89 BP: 149/99 mmHg Patient Position (if appropriate): Sitting Oxygen Therapy SpO2:  94 % Pain Pain Assessment Pain Assessment: No/denies pain Pain Score: 0-No pain Home Living/Prior Functioning Home Living Available Help at Discharge: Other (Comment);Family (checks on him every few days. 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) Vision/Perception  Vision - History Baseline Vision: Wears glasses only for reading Visual History: Cataracts Vision - Assessment Eye Alignment: Within Functional Limits Vision Assessment: Vision tested Additional Comments: Tracking WFL Perception Perception: Within Functional Limits Praxis Praxis: Intact  Cognition Overall Cognitive Status: Impaired/Different from baseline Arousal/Alertness: Awake/alert Orientation Level: Oriented X4 Safety/Judgment: Impaired Comments: Some L-inattention to environment, but overall quite cautious Sensation Sensation Light Touch: Appears Intact Additional Comments: Pt reports longstanding tingling from neuropathy Coordination Gross Motor Movements are Fluid and Coordinated: No Fine Motor Movements are Fluid and Coordinated: No Coordination and Movement  Description: Decreased accuracy and excursion for [facility] with buttons, decreased fluidity wtih LE movements during gait and transfers, complicated by weakness Heel Shin Test: Decreased timing Motor  Motor Motor: Hemiplegia;Abnormal postural alignment and control Motor - Skilled Clinical Observations: Pt with L-sided weakness, reporting L UE feels "heavy." Pt unable to hold trunk steady in unsupported standing, vascillating betwwen lateral and AP sways, jerking about to gain balance  Mobility Transfers Transfers: Yes Stand Pivot Transfers: 3: Mod assist;4: Min assist (Min A initially, but Mod once starting to fatigue) Stand Pivot Transfer Details (indicate cue type and reason): Pt initailly needing only steady assist, but quickly fatigues, needing Mod lifting assist Locomotion  Ambulation Ambulation: Yes Ambulation/Gait Assistance: 3: Mod assist Ambulation Distance (Feet): 45 Feet Assistive device: Other (Comment) (HHA) Ambulation/Gait Assistance Details: Mod A during turns and for 1 LOB tripping on toes Gait Gait: Yes Gait Pattern: Impaired Gait Pattern: Decreased step length - right;Decreased step length - left;Decreased hip/knee flexion - right;Decreased hip/knee flexion - left;Decreased weight shift to left;Shuffle;Wide base of support Stairs / Additional Locomotion Stairs: Yes Stairs Assistance: 4: Min assist Stairs Assistance Details (indicate cue type and reason): Steadyigbn assist with descent, bilUE support Stair Management Technique: Two rails;Step to pattern;Forwards Number of Stairs: 5 Height of Stairs: 6 Architect: Yes Wheelchair Assistance: 5: Careers information officer: Both upper extremities Wheelchair Parts Management: Needs assistance Distance: 175  Trunk/Postural Assessment  Cervical Assessment Cervical Assessment: Within Functional Limits Thoracic Assessment Thoracic Assessment: Within Functional Limits Lumbar  Assessment Lumbar Assessment: Exceptions to [facility] (Decreased pelvic mobility, posterior tilt) Postural Control Postural Control: Deficits on evaluation Trunk Control: Decreased in standing, with increased sway in all directions without challenge Righting Reactions: Jerky and insufficient, needing to reach out for support Protective Responses: Relies on UEs to catch balance  Balance Balance Balance Assessed: Yes Standardized Balance Assessment Standardized Balance Assessment: Berg Balance Test Berg Balance Test Sit to Stand: Needs minimal aid to stand or to stabilize Standing Unsupported: Able to stand 2 minutes with supervision Sitting with Back Unsupported but Feet Supported on Floor or Stool: Able to sit safely and securely 2 minutes Stand to Sit: Uses backs of legs against chair to control descent Transfers: Needs one person to assist Extremity Assessment  RUE  Assessment RUE Assessment: Within Functional Limits LUE Assessment LUE Assessment: Exceptions to [facility] (Pt reports "heavy," defer to OT note) RLE Assessment RLE Assessment: Exceptions to [facility] RLE Strength RLE Overall Strength: Deficits RLE Overall Strength Comments: Grossly 4-/5 throughout except 3+/5 for hip flexion LLE Assessment LLE Assessment: Exceptions to [facility] LLE Strength LLE Overall Strength Comments: 3+/5 throughout, also reports L knee feels "different" as in weak  FIM:  FIM - Engineer, site Assistive Devices: Arm rests Bed/Chair Transfer: 3: Sit > Supine: Mod A (lifting assist/Pt. 50-74%/lift 2 legs);4: Bed > Chair or W/C: Min A (steadying Pt. > 75%) FIM - Locomotion: Wheelchair Distance: 175 Locomotion: Wheelchair: 5: Travels 150 ft or more: maneuvers on rugs and over door sills with supervision, cueing or coaxing FIM - Locomotion: Ambulation Locomotion: Ambulation Assistive Devices: Other (comment) (HHA) Ambulation/Gait Assistance: 3: Mod assist Locomotion: Ambulation: 1: Travels less  than 50 ft with moderate assistance (Pt: 50 - 74%) FIM - Locomotion: Stairs Locomotion: Scientist, physiological: Hand rail - 2 Locomotion: Stairs: 2: Up and Down 4 - 11 stairs with minimal assistance (Pt.>75%)   Refer to Care Plan for Long Term Goals  Recommendations for other services: Neuropsych  Discharge Criteria: Patient will be discharged from PT if patient refuses treatment 3 consecutive times without medical reason, if treatment goals not met, if there is a change in medical status, if patient makes no progress towards goals or if patient is discharged from hospital.  The above assessment, treatment plan, treatment alternatives and goals were discussed and mutually agreed upon: by patient  Kennieth Rad, PT, DPT  04/02/2014, 10:27 AM

## 2014-04-02 NOTE — Progress Notes (Signed)
Subjective/Complaints: 80 y.o. right-handed male history of peripheral neuropathy. Presented 03/26/2014 after a fall and remained down for approximately 12 hours until found by family. Patient independent prior to admission living alone and driving. Noted total CK of 1550, troponin negative, white blood cell count 14,100 and BUN 27. Cranial CT scan with no evidence of intracranial abnormality noted chronic lacunar infarct at the right thalamus. Patient did not receive TPA. The thoracic and lumbar spines negative for acute changes. Pelvis films without fracture. MRI of the brain revealed an acute right PCA infarct. Carotid Dopplers with no ICA stenosis. Echocardiogram with ejection fraction of 93% grade 1 diastolic dysfunction  Slept poorly but no other c/os  Review of Systems - Negative except poor sleep  Objective: Vital Signs: Blood pressure 129/69, pulse 77, temperature 97.6 F (36.4 C), temperature source Oral, resp. rate 18, height 5\' 8"  (1.727 m), weight 92.987 kg (205 lb), SpO2 94.00%. No results found. Results for orders placed during the hospital encounter of 04/01/14 (from the past 72 hour(s))  CBC WITH DIFFERENTIAL     Status: None   Collection Time    04/02/14  7:00 AM      Result Value Ref Range   WBC 9.0  4.0 - 10.5 K/uL   RBC 4.73  4.22 - 5.81 MIL/uL   Hemoglobin 14.1  13.0 - 17.0 g/dL   HCT 42.7  39.0 - 52.0 %   MCV 90.3  78.0 - 100.0 fL   MCH 29.8  26.0 - 34.0 pg   MCHC 33.0  30.0 - 36.0 g/dL   RDW 15.0  11.5 - 15.5 %   Platelets 294  150 - 400 K/uL   Neutrophils Relative % 73  43 - 77 %   Neutro Abs 6.5  1.7 - 7.7 K/uL   Lymphocytes Relative 19  12 - 46 %   Lymphs Abs 1.7  0.7 - 4.0 K/uL   Monocytes Relative 5  3 - 12 %   Monocytes Absolute 0.5  0.1 - 1.0 K/uL   Eosinophils Relative 3  0 - 5 %   Eosinophils Absolute 0.2  0.0 - 0.7 K/uL   Basophils Relative 0  0 - 1 %   Basophils Absolute 0.0  0.0 - 0.1 K/uL     HEENT: normal Cardio: RRR and no murmurs Resp:  CTA B/L and unlabored GI: BS positive and NT Extremity:  Pulses positive and No Edema Skin:   Intact Neuro: Alert/Oriented, Cranial Nerve II-XII normal, Normal Sensory, Normal Motor and Abnormal FMC Ataxic/ dec FMC Musc/Skel:  Normal Gen NAD   Assessment/Plan: 1. Functional deficits secondary to R PCA infarct which require 3+ hours per day of interdisciplinary therapy in a comprehensive inpatient rehab setting. Physiatrist is providing close team supervision and 24 hour management of active medical problems listed below. Physiatrist and rehab team continue to assess barriers to discharge/monitor patient progress toward functional and medical goals. FIM:                   Comprehension Comprehension Mode: Auditory Comprehension: 5-Understands basic 90% of the time/requires cueing < 10% of the time  Expression Expression Mode: Verbal Expression: 5-Expresses basic needs/ideas: With extra time/assistive device  Social Interaction Social Interaction: 5-Interacts appropriately 90% of the time - Needs monitoring or encouragement for participation or interaction.  Problem Solving Problem Solving: 5-Solves basic problems: With no assist  Memory Memory: 4-Recognizes or recalls 75 - 89% of the time/requires cueing 10 - 24% of the time  Medical Problem List and Plan:  1. Functional deficits secondary to right PCA infarct/rhabdomyolysis  2. DVT Prophylaxis/Anticoagulation: Subcutaneous Lovenox. Monitor platelet counts and any signs of bleeding  3. Pain Management: Tylenol as needed, heat/stretching/strengthening for quads/proximal LE's  4. Neuropsych: This patient is capable of making decisions on his own behalf.  5. History of peripheral neuropathy. Continue Cymbalta.  6. Hypertension. Toprol-XL 12.5 mg daily. Monitor with increased mobility   LOS (Days) 1 A FACE TO FACE EVALUATION WAS PERFORMED  Charlett Blake 04/02/2014, 7:42 AM

## 2014-04-02 NOTE — Care Management Note (Signed)
[facility] Individual Statement of Services  Patient Name:  Carl Rios  Date:  04/02/2014  Welcome to the [facility].  Our goal is to provide you with an individualized program based on your diagnosis and situation, designed to meet your specific needs.  With this comprehensive rehabilitation program, you will be expected to participate in at least 3 hours of rehabilitation therapies Monday-Friday, with modified therapy programming on the weekends.  Your rehabilitation program will include the following services:  Physical Therapy (PT), Occupational Therapy (OT), Speech Therapy (ST), 24 hour per day rehabilitation nursing, Case Management (Social Worker), Rehabilitation Medicine, Nutrition Services and Pharmacy Services  Weekly team conferences will be held on Wednesday to discuss your progress.  Your Social Worker will talk with you frequently to get your input and to update you on team discussions.  Team conferences with you and your family in attendance may also be held.  Expected length of stay: 12-15 days Overall anticipated outcome: supervision level  Depending on your progress and recovery, your program may change. Your Social Worker will coordinate services and will keep you informed of any changes. Your Social Worker's name and contact numbers are listed  below.  The following services may also be recommended but are not provided by the [facility] will be made to provide these services after discharge if needed.  Arrangements include referral to agencies that provide these services.  Your insurance has been verified to be:  [employer] Your primary doctor is:  Dr. Marton Redwood  Pertinent information will be shared with your doctor and your insurance company.  Social Worker:  Ovidio Kin, [place] or ([phone]  Information discussed with and copy given to patient by: Elease Hashimoto, 04/02/2014, 3:09 PM

## 2014-04-02 NOTE — Evaluation (Signed)
Speech Language Pathology Assessment and Plan  Patient Details  Name: Carl Rios MRN: [id number] Date of Birth: [birth year]  SLP Diagnosis: Cognitive Impairments  Rehab Potential: Good ELOS: 10-14 days   Today's Date: 04/02/2014 Time: 1000-1105 Time Calculation (min): 65 min  Problem List:  Patient Active Problem List   Diagnosis Date Noted  . Recurrent falls 04/01/2014  . Paroxysmal SVT (supraventricular tachycardia) 04/01/2014  . Unsteady gait 04/01/2014  . CVA (cerebral infarction) 03/31/2014  . Acute renal failure 03/27/2014  . Dehydration 03/27/2014  . Rhabdomyolysis 03/26/2014  . AAA (abdominal aortic aneurysm) without rupture 08/14/2013  . Abnormality of gait 01/08/2013  . Other general symptoms(780.99) 01/08/2013  . Other vitamin B12 deficiency anemia 01/08/2013  . Polyneuropathy in other diseases classified elsewhere 01/08/2013  . Abdominal aneurysm without mention of rupture 02/01/2012  . Hypokalemia 01/25/2012  . Anemia due to acute blood loss 01/24/2012  . Benign neoplasm of colon 01/24/2012  . Hypertension 01/19/2012  . Other and unspecified hyperlipidemia 01/18/2012  . Impaired fasting glucose 01/18/2012  . Unspecified hereditary and idiopathic peripheral neuropathy 01/18/2012  . Hypertonicity of bladder 01/18/2012  . Diverticulosis 01/18/2012  . Rectal bleeding 01/18/2012  . GERD 02/09/2010  . COLONIC POLYPS, ADENOMATOUS, HX OF 02/09/2010   Past Medical History:  Past Medical History  Diagnosis Date  . AAA (abdominal aortic aneurysm) without rupture   . Hyperlipidemia   . Diverticulosis   . GERD (gastroesophageal reflux disease)   . Hx of adenomatous colonic polyps   . Peripheral vascular disease   . Obesity   . Dyslipidemia   . History of colon polyps   . History of prostatitis   . Hearing difficulty   . Peripheral neuropathy    Past Surgical History:  Past Surgical History  Procedure Laterality Date  . Knee surgery      left knee in  1970's  . Colonoscopy  01/24/2012    Procedure: COLONOSCOPY;  Surgeon: Lafayette Dragon, MD;  Location: [facility];  Service: Endoscopy;  Laterality: N/A;  . Inguinal hernia repair    . Cataract extraction Bilateral   . Tonsillectomy    . Abdominal aortic endovascular stent graft N/A 08/23/2013    Procedure: ABDOMINAL AORTIC ENDOVASCULAR STENT GRAFT;  Surgeon: Mal Misty, MD;  Location: [facility] OR;  Service: Vascular;  Laterality: N/A;  . Abdominal aortic aneurysm repair      Assessment / Plan / Recommendation Clinical Impression   Carl Rios is an 80 y.o. right-handed male history of peripheral neuropathy. Presented 03/26/2014 after a fall and remained down for approximately 12 hours until found by family. Patient independent prior to admission living alone and driving.  Cranial CT scan with no evidence of intracranial abnormality noted chronic lacunar infarct at the right thalamus. Patient did not receive TPA.  MRI of the brain revealed an acute right PCA infarct. He is tolerating a mechanical soft diet. Physical occupational therapy evaluations completed. Patient requiring Max assist to prevent falls in getting back to bed with loss of balance. Recommendations by physical and occupational therapy are made for physical medicine rehabilitation consult. Patient was admitted for comprehensive rehabilitation program 6/1.  SLP completed a cognitive-linguistic and bedside swallow evaluation on 6/2 with the following results.  Pt presents with mild-moderate cognitive impairments for working memory, executive function, and intellectual awareness.  Pt was noted to be somewhat impulsive during functional transfers; however, he was receptive to cuing for safety awareness/insight.  Additionally, pt was noted with adequate oral manipulation  and clearance of regular solid boluses.  No overt s/s of aspiration were noted across any consistencies assessed.  Recommend a diet upgrade to regular solids/thin liquids;  intermittent supervision for swallowing precautions.  Pt would benefit from skilled speech services while inpatient to target memory, problem solving, and awareness in order to maximize functional independence and reduce burden of care upon discharge.    Skilled Therapeutic Interventions          Cognitive-linguistic evaluation completed with results and recommendations reviewed with family.     SLP Assessment  Patient will need skilled Speech Lanaguage Pathology Services during CIR admission    Recommendations  Diet Recommendations: Thin liquid;Regular Liquid Administration via: Cup;Straw Medication Administration: Whole meds with puree Supervision: Patient able to self feed;Intermittent supervision to cue for compensatory strategies Compensations: Slow rate;Small sips/bites Postural Changes and/or Swallow Maneuvers: Seated upright 90 degrees;Upright 30-60 min after meal Oral Care Recommendations: Oral care BID Patient destination:  (TBD) Follow up Recommendations: Home Health SLP;24 hour supervision/assistance    SLP Frequency 5 out of 7 days   SLP Treatment/Interventions Cognitive remediation/compensation;Cueing hierarchy;Patient/family education;Therapeutic Activities;Internal/external aids;Environmental controls;Functional tasks    Pain Pain Assessment Pain Assessment: No/denies pain Pain Score: 0-No pain Prior Functioning Cognitive/Linguistic Baseline: Within functional limits Type of Home: House  Lives With: Alone Available Help at Discharge: Family;Available PRN/intermittently Education: [name], 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  SLP Short Term Goal 2 (Week 1): Pt will identify at least 2 physical deficits and 1 cognitive deficit and their impact on his functional independence at home  with min assist.  SLP Short Term Goal 3 (Week 1): Pt will improve executive function for planning, thought organization, mental flexibility and error awareness during functional tasks for 80% accuracy and min assist as needed.    See FIM for current functional status Refer to Care Plan for Long Term Goals  Recommendations for other services: None  Discharge Criteria: Patient will be discharged from SLP if patient refuses treatment 3 consecutive times without medical reason, if treatment goals not met, if there is a change in medical status, if patient makes no progress towards goals or if patient is discharged from hospital.  The above assessment, treatment plan, treatment alternatives and goals were discussed and mutually agreed upon: by patient  Selinda Orion Dominic Mahaney 04/02/2014, 11:31 AM

## 2014-04-03 ENCOUNTER — Inpatient Hospital Stay (HOSPITAL_COMMUNITY): Payer: Medicare Other | Admitting: Occupational Therapy

## 2014-04-03 ENCOUNTER — Inpatient Hospital Stay (HOSPITAL_COMMUNITY): Payer: Medicare Other | Admitting: Speech Pathology

## 2014-04-03 ENCOUNTER — Inpatient Hospital Stay (HOSPITAL_COMMUNITY): Payer: Medicare Other

## 2014-04-03 MED ORDER — CEPHALEXIN 250 MG PO CAPS
250.0000 mg | ORAL_CAPSULE | Freq: Three times a day (TID) | ORAL | Status: DC
Start: 2014-04-03 — End: 2014-04-10
  Administered 2014-04-03 – 2014-04-10 (×20): 250 mg via ORAL
  Filled 2014-04-03 (×24): qty 1

## 2014-04-03 NOTE — Progress Notes (Signed)
Occupational Therapy Session Note  Patient Details  Name: EFRAIM VANALLEN MRN: [id number] Date of Birth: [birth year]  Today's Date: 04/03/2014 Time: 3382-5053 and 1415-1510 Time Calculation (min): 45 min and 55 min  Short Term Goals: Week 1:  OT Short Term Goal 1 (Week 1): Pt will be able to stand to adjust clothing before and after toileting with supervision only. OT Short Term Goal 2 (Week 1): Pt will be able to transfer to toilet using RW with supervision only.   OT Short Term Goal 3 (Week 1): Pt will be able to stand in shower with close S demonstrating improved balance without posterior or L lean. OT Short Term Goal 4 (Week 1): Pt will be able to do room exercise program for LUE with min cues.  Skilled Therapeutic Interventions/Progress Updates:    Visit 1:  No c/o pain.    Pt seen for BADL retraining of toileting, bathing, and dressing with a focus on L attention to environment, activity tolerance, and dynamic balance. Pt sat to EOB to finish eating his breakfast, he then ambulated to toilet with RW with min A. Toileted with steady A and then showered with steady A as pt has a posterior lean. He did respond to cues to bring his body weight over his toes to correct his postural alignment. Pt did demonstrate a mild L inattention when putting his pants on today, but self corrected with one cue. Overall, good activity tolerance with no c/o dizziness. Pt needed to stand to brush his teeth, his NT came in to assist pt as therapy time was completed.    Visit 2: No c/o pain.  Pt seen this session for neuro re-ed of LUE and balance skills. Pt ambulated to gym with min A and frequent cues to keep his walker in alignment as he tended to drift it to the right and his L foot would frequently kick the walker.  In the gym, pt sat in arm chair and worked on LUE strengthening and coordination activities. Pt has demonstrated improvement in the smoothness of movement and his ability to lift resistance with his LUE.   Pt worked on [employer] focusing on a slow decent.  Pt ambulated back to room. When in room, pt was asked if he would like to use the rest room. He said yes, but began to urinate and it leaked through his brief on the way to the toilet. Pt needed a complete clothing change, he was able to change his pants with supervision.  Pt ambulated back to recliner to rest with QRB and call light in reach.  Therapy Documentation Precautions:  Precautions Precautions: Fall Precaution Comments: HOH Restrictions Weight Bearing Restrictions: No   Pain: Pain Assessment Pain Assessment: No/denies pain ADL: ADL ADL Comments: Refer to FIM  See FIM for current functional status  Therapy/Group: Individual Therapy  Harlene Ramus 04/03/2014, 12:46 PM

## 2014-04-03 NOTE — Progress Notes (Signed)
Physical Therapy Session Note  Patient Details  Name: Carl Rios MRN: [id number] Date of Birth: [birth year]  Today's Date: 04/03/2014 Time: 7672-0947 Time Calculation (min): 45 min  Short Term Goals: Week 1:  PT Short Term Goal 1 (Week 1): STG=LTG due to short LOS, Supervision gait x150' and stairs, Mod I transfers  Skilled Therapeutic Interventions/Progress Updates:  Pt stated he should use toilet before tx.  Pt impulsive with transfer, w/c mobility in room, reaching toward feet with w/c unlocked, etc.  Safety concerns discussed with pt.    Toilet transfer with min assist using wall bar.  Standing balance min guard assist during clothing manipulation.  neuromuscular re-education via forced use, demo, tactile cues during reaching with wt shifting with R and L hands out of BOS in supported> unsupported sitting, for trunk shortening/lengthening and rotating. Gait with RW x 87' x 186' with min assist except for 1 LOB to L due to R foot bumping RW leg during turn; mod cues for L attention and upright posture and forward gaze. Balance retraining in static and dynamic standing on wedge for ankle and hip strategy activation.  Self stretching bil hamstrings in sitting, reaching forward with bil hands.  Pt felt stretch in buttocks and behind knees.  Returned to room; left up in recliner with all needs in place.  Quick release belt applied.    Therapy Documentation Precautions:  Precautions Precautions: Fall Precaution Comments: HOH Restrictions Weight Bearing Restrictions: No General:   Vital Signs:   Pain: Pain Assessment Pain Assessment: No/denies pain    See FIM for current functional status  Therapy/Group: [place] 04/03/2014, 1:41 PM

## 2014-04-03 NOTE — Progress Notes (Signed)
Social Work Patient ID: Carl Rios, male   DOB: 03-20-1926, 80 y.o.   MRN: [id number] Met with pt and spoke with [name] via telephone to discuss team conference to inform pt's goals-supervision level and discharge 6/15. Discussed options of hiring assistance versus going to ALF and they would need to discuss this and pt would decide which to pursue.  Aware both are  Private pay and would need to be planned for.  Son to discuss with pt tonight and both decide what would work the best for pt.  Both pleased with the Progress he has made thus far.  Discuss the options with pt and son's and son to get back with this worker regarding the plan.

## 2014-04-03 NOTE — Patient Care Conference (Signed)
Inpatient RehabilitationTeam Conference and Plan of Care Update Date: 04/03/2014   Time: 11;15 AM    Patient Name: Carl Rios      Medical Record Number: [id number]  Date of Birth: [birth year] Sex: Male         Room/Bed: 4W07C/4W07C-01 Payor Info: Payor: MEDICARE / Plan: MEDICARE PART A AND B / Product Type: *No Product type* /    Admitting Diagnosis: polytrauma  Admit Date/Time:  04/01/2014  4:05 PM Admission Comments: No comment available   Primary Diagnosis:  <principal problem not specified> Principal Problem: <principal problem not specified>  Patient Active Problem List   Diagnosis Date Noted  . Recurrent falls 04/01/2014  . Paroxysmal SVT (supraventricular tachycardia) 04/01/2014  . Unsteady gait 04/01/2014  . CVA (cerebral infarction) 03/31/2014  . Acute renal failure 03/27/2014  . Dehydration 03/27/2014  . Rhabdomyolysis 03/26/2014  . AAA (abdominal aortic aneurysm) without rupture 08/14/2013  . Abnormality of gait 01/08/2013  . Other general symptoms(780.99) 01/08/2013  . Other vitamin B12 deficiency anemia 01/08/2013  . Polyneuropathy in other diseases classified elsewhere 01/08/2013  . Abdominal aneurysm without mention of rupture 02/01/2012  . Hypokalemia 01/25/2012  . Anemia due to acute blood loss 01/24/2012  . Benign neoplasm of colon 01/24/2012  . Hypertension 01/19/2012  . Other and unspecified hyperlipidemia 01/18/2012  . Impaired fasting glucose 01/18/2012  . Unspecified hereditary and idiopathic peripheral neuropathy 01/18/2012  . Hypertonicity of bladder 01/18/2012  . Diverticulosis 01/18/2012  . Rectal bleeding 01/18/2012  . GERD 02/09/2010  . COLONIC POLYPS, ADENOMATOUS, HX OF 02/09/2010    Expected Discharge Date: Expected Discharge Date: 04/15/14  Team Members Present: Physician leading conference: Dr. Alysia Penna Social Worker Present: Ovidio Kin, LCSW Nurse Present: Elliot Cousin, RN PT Present: Raylene Everts, PT;Blair Hobble,  Renaye Rakers, PT OT Present: Antony Salmon, OT SLP Present: Other (comment) Elmyra Ricks Page-SPT) PPS Coordinator present : Ileana Ladd, Lelan Pons, RN, CRRN     Current Status/Progress Goal Weekly Team Focus  Medical   R PCA, mild neglect, HOH, occ cog cues, receptive to cues  Mod I meds, reduce impulsivity  eval for UTI, eval bladder incont   Bowel/Bladder   Incontinent of bowel and bladder, Miralax 17g daily, sorbitol 50ml prn. Condom cath  Stay free of infection and skin break down.  Monitor bowel movements and output daily.   Swallow/Nutrition/ Hydration   upraded to regular solids, thin liquids with intermittent supervision   mod I with least restrictive diet   diet tolerance    ADL's   steady A with basic ADLs., mod A with ambulation/ transfers with RW  mod I with basic ADLs, mod I toilet transfer, supervision for shower transfer, light meal prep and homemaking  ADL retraining, balance activities, UE coordination and strength, pt education   Mobility   Mod A transfers and gait x45' with RW, min A bed mobility, S WC x150', 15/56 Berg balance test  Supervision gait x150', Mod I bed mobility and transfers, Mod I WC x150' and 37/56 Berg  transfer training, strengthening, gait with RW, static and dynamic balance   Communication   n/a  n/a  n/a   Safety/Cognition/ Behavioral Observations  min-mod assist for memory, executive function, safety awareness/insight   min assist-supervision   strategy education    Pain   Tylenol 325-650 q4h prn. Pain level 0 on a scale of 0-10  Continue to monitor for verbalizing or nonverbal signs of pain Q shift  Patient at <3 on pain scale.  Skin   N/A  Keep skin free of infection and breakdown.  Monitor skin q shift.      *See Care Plan and progress notes for long and short-term goals.  Barriers to Discharge: Needs 24/7 plan    Possible Resolutions to Barriers:  sons in [place] and [place]    Discharge Planning/Teaching Needs:  Home  with caregiver and son to assit, still coming up with a plan      Team Discussion:  Treating UTI-balance and safety issues-will recommend 24 hr supervision at discharge.  Making progress, but will need to decide hired assist versus ALF  Revisions to Treatment Plan:  None   Continued Need for Acute Rehabilitation Level of Care: The patient requires daily medical management by a physician with specialized training in physical medicine and rehabilitation for the following conditions: Daily direction of a multidisciplinary physical rehabilitation program to ensure safe treatment while eliciting the highest outcome that is of practical value to the patient.: Yes Daily medical management of patient stability for increased activity during participation in an intensive rehabilitation regime.: Yes Daily analysis of laboratory values and/or radiology reports with any subsequent need for medication adjustment of medical intervention for : Neurological problems  Elease Hashimoto 04/03/2014, 2:54 PM

## 2014-04-03 NOTE — Progress Notes (Signed)
Subjective/Complaints: 80 y.o. right-handed male history of peripheral neuropathy. Presented 03/26/2014 after a fall and remained down for approximately 12 hours until found by family. Patient independent prior to admission living alone and driving. Noted total CK of 1550, troponin negative, white blood cell count 14,100 and BUN 27. Cranial CT scan with no evidence of intracranial abnormality noted chronic lacunar infarct at the right thalamus. Patient did not receive TPA. The thoracic and lumbar spines negative for acute changes. Pelvis films without fracture. MRI of the brain revealed an acute right PCA infarct. Carotid Dopplers with no ICA stenosis. Echocardiogram with ejection fraction of 50% grade 1 diastolic dysfunction  Slept poorly but no other c/os, denies dysuria  Review of Systems - Negative except poor sleep  Objective: Vital Signs: Blood pressure 124/79, pulse 81, temperature 97.6 F (36.4 C), temperature source Oral, resp. rate 18, height _0  (1.727 m), weight 83.008 kg (183 lb), SpO2 96.00%. No results found. Results for orders placed during the hospital encounter of 04/01/14 (from the past 72 hour(s))  CBC WITH DIFFERENTIAL     Status: None   Collection Time    04/02/14  7:00 AM      Result Value Ref Range   WBC 9.0  4.0 - 10.5 K/uL   RBC 4.73  4.22 - 5.81 MIL/uL   Hemoglobin 14.1  13.0 - 17.0 g/dL   HCT 42.7  39.0 - 52.0 %   MCV 90.3  78.0 - 100.0 fL   MCH 29.8  26.0 - 34.0 pg   MCHC 33.0  30.0 - 36.0 g/dL   RDW 15.0  11.5 - 15.5 %   Platelets 294  150 - 400 K/uL   Neutrophils Relative % 73  43 - 77 %   Neutro Abs 6.5  1.7 - 7.7 K/uL   Lymphocytes Relative 19  12 - 46 %   Lymphs Abs 1.7  0.7 - 4.0 K/uL   Monocytes Relative 5  3 - 12 %   Monocytes Absolute 0.5  0.1 - 1.0 K/uL   Eosinophils Relative 3  0 - 5 %   Eosinophils Absolute 0.2  0.0 - 0.7 K/uL   Basophils Relative 0  0 - 1 %   Basophils Absolute 0.0  0.0 - 0.1 K/uL  COMPREHENSIVE METABOLIC PANEL      Status: Abnormal   Collection Time    04/02/14  7:00 AM      Result Value Ref Range   Sodium 139  137 - 147 mEq/L   Potassium 5.3  3.7 - 5.3 mEq/L   Comment: HEMOLYSIS AT THIS LEVEL MAY AFFECT RESULT   Chloride 102  96 - 112 mEq/L   CO2 26  19 - 32 mEq/L   Glucose, Bld 113 (*) 70 - 99 mg/dL   BUN 18  6 - 23 mg/dL   Creatinine, Ser 0.87  0.50 - 1.35 mg/dL   Calcium 9.3  8.4 - 10.5 mg/dL   Total Protein 6.3  6.0 - 8.3 g/dL   Albumin 3.2 (*) 3.5 - 5.2 g/dL   AST 32  0 - 37 U/L   Comment: HEMOLYSIS AT THIS LEVEL MAY AFFECT RESULT   ALT 26  0 - 53 U/L   Comment: HEMOLYSIS AT THIS LEVEL MAY AFFECT RESULT   Alkaline Phosphatase 87  39 - 117 U/L   Total Bilirubin 0.7  0.3 - 1.2 mg/dL   GFR calc non Af Amer 76 (*) >90 mL/min   GFR calc Af Amer 88 (*) >  90 mL/min   Comment: (NOTE)     The eGFR has been calculated using the CKD EPI equation.     This calculation has not been validated in all clinical situations.     eGFR's persistently <90 mL/min signify possible Chronic Kidney     Disease.  URINALYSIS, ROUTINE W REFLEX MICROSCOPIC     Status: Abnormal   Collection Time    04/02/14  4:22 PM      Result Value Ref Range   Color, Urine YELLOW  YELLOW   APPearance CLOUDY (*) CLEAR   Specific Gravity, Urine 1.020  1.005 - 1.030   pH 5.5  5.0 - 8.0   Glucose, UA NEGATIVE  NEGATIVE mg/dL   Hgb urine dipstick TRACE (*) NEGATIVE   Bilirubin Urine NEGATIVE  NEGATIVE   Ketones, ur NEGATIVE  NEGATIVE mg/dL   Protein, ur NEGATIVE  NEGATIVE mg/dL   Urobilinogen, UA 0.2  0.0 - 1.0 mg/dL   Nitrite POSITIVE (*) NEGATIVE   Leukocytes, UA MODERATE (*) NEGATIVE  URINE CULTURE     Status: None   Collection Time    04/02/14  4:22 PM      Result Value Ref Range   Specimen Description URINE, CLEAN CATCH     Special Requests NONE     Culture  Setup Time       Value: 04/02/2014 17:45     Performed at [facility]       Value: >=100,000 COLONIES/ML     Performed at [facility]   Culture       Value: ESCHERICHIA COLI     Performed at [facility]   Report Status PENDING    URINE MICROSCOPIC-ADD ON     Status: Abnormal   Collection Time    04/02/14  4:22 PM      Result Value Ref Range   WBC, UA 21-50  <3 WBC/hpf   RBC / HPF 0-2  <3 RBC/hpf   Bacteria, UA MANY (*) RARE     HEENT: normal Cardio: RRR and no murmurs Resp: CTA B/L and unlabored GI: BS positive and NT Extremity:  Pulses positive and No Edema Skin:   Intact Neuro: Alert/Oriented, Cranial Nerve II-XII normal, Normal Sensory, Normal Motor and Abnormal FMC Ataxic/ dec FMC Musc/Skel:  Normal Gen NAD   Assessment/Plan: 1. Functional deficits secondary to R PCA infarct which require 3+ hours per day of interdisciplinary therapy in a comprehensive inpatient rehab setting. Physiatrist is providing close team supervision and 24 hour management of active medical problems listed below. Physiatrist and rehab team continue to assess barriers to discharge/monitor patient progress toward functional and medical goals. Team conference today please see physician documentation under team conference tab, met with team face-to-face to discuss problems,progress, and goals. Formulized individual treatment plan based on medical history, underlying problem and comorbidities. FIM: FIM - Bathing Bathing Steps Patient Completed: Chest;Right Arm;Left Arm;Buttocks;Front perineal area;Abdomen;Right upper leg;Left upper leg;Right lower leg (including foot);Left lower leg (including foot) Bathing: 4: Steadying assist  FIM - Upper Body Dressing/Undressing Upper body dressing/undressing steps patient completed: Thread/unthread right sleeve of pullover shirt/dresss;Thread/unthread left sleeve of pullover shirt/dress;Put head through opening of pull over shirt/dress;Pull shirt over trunk Upper body dressing/undressing: 5: Set-up assist to: Obtain clothing/put away FIM - Lower Body Dressing/Undressing Lower body  dressing/undressing steps patient completed: Thread/unthread right pants leg;Thread/unthread left pants leg;Pull pants up/down;Fasten/unfasten pants;Don/Doff right shoe;Don/Doff left shoe Lower body dressing/undressing: 4: Steadying Assist  FIM -  Toileting Toileting steps completed by patient: Adjust clothing prior to toileting;Performs perineal hygiene;Adjust clothing after toileting Toileting Assistive Devices: Grab bar or rail for support Toileting: 4: Steadying assist  FIM - Radio producer Devices: Walker;Elevated toilet seat;Grab bars Toilet Transfers: 3-To toilet/BSC: Mod A (lift or lower assist);4-From toilet/BSC: Min A (steadying Pt. > 75%)  FIM - Bed/Chair Transfer Bed/Chair Transfer Assistive Devices: Arm rests;Bed rails Bed/Chair Transfer: 4: Sit > Supine: Min A (steadying pt. > 75%/lift 1 leg);4: Bed > Chair or W/C: Min A (steadying Pt. > 75%);3: Chair or W/C > Bed: Mod A (lift or lower assist)  FIM - Locomotion: Wheelchair Distance: 175 Locomotion: Wheelchair: 0: Activity did not occur FIM - Locomotion: Ambulation Locomotion: Ambulation Assistive Devices: Administrator Ambulation/Gait Assistance: 3: Mod assist Locomotion: Ambulation: 1: Travels less than 50 ft with moderate assistance (Pt: 50 - 74%)  Comprehension Comprehension Mode: Auditory Comprehension: 5-Understands basic 90% of the time/requires cueing < 10% of the time  Expression Expression Mode: Verbal Expression: 5-Expresses basic needs/ideas: With extra time/assistive device  Social Interaction Social Interaction: 6-Interacts appropriately with others with medication or extra time (anti-anxiety, antidepressant).  Problem Solving Problem Solving: 4-Solves basic 75 - 89% of the time/requires cueing 10 - 24% of the time  Memory Memory: 4-Recognizes or recalls 75 - 89% of the time/requires cueing 10 - 24% of the time   Medical Problem List and Plan:  1. Functional deficits  secondary to right PCA infarct/rhabdomyolysis  2. DVT Prophylaxis/Anticoagulation: Subcutaneous Lovenox. Monitor platelet counts and any signs of bleeding  3. Pain Management: Tylenol as needed, heat/stretching/strengthening for quads/proximal LE's  4. Neuropsych: This patient is capable of making decisions on his own behalf.  5. History of peripheral neuropathy. Continue Cymbalta.  6. Hypertension. Toprol-XL 12.5 mg daily. Monitor with increased mobility  7.  UTI, empiric abx until Cx back LOS (Days) 2 A FACE TO FACE EVALUATION WAS PERFORMED  Charlett Blake 04/03/2014, 11:53 AM

## 2014-04-03 NOTE — Progress Notes (Signed)
Social Work Elease Hashimoto, LCSW Social Worker Signed  Patient Care Conference Service date: 04/03/2014 2:54 PM  Inpatient RehabilitationTeam Conference and Plan of Care Update Date: 04/03/2014   Time: 11;15 AM     Patient Name: Carl Rios       Medical Record Number: [id number]   Date of Birth: 09-Feb-1926 Sex: Male         Room/Bed: 4W07C/4W07C-01 Payor Info: Payor: MEDICARE / Plan: MEDICARE PART A AND B / Product Type: *No Product type* /   Admitting Diagnosis: polytrauma   Admit Date/Time:  04/01/2014  4:05 PM Admission Comments: No comment available   Primary Diagnosis:  <principal problem not specified> Principal Problem: <principal problem not specified>    Patient Active Problem List     Diagnosis  Date Noted   .  Recurrent falls  04/01/2014   .  Paroxysmal SVT (supraventricular tachycardia)  04/01/2014   .  Unsteady gait  04/01/2014   .  CVA (cerebral infarction)  03/31/2014   .  Acute renal failure  03/27/2014   .  Dehydration  03/27/2014   .  Rhabdomyolysis  03/26/2014   .  AAA (abdominal aortic aneurysm) without rupture  08/14/2013   .  Abnormality of gait  01/08/2013   .  Other general symptoms(780.99)  01/08/2013   .  Other vitamin B12 deficiency anemia  01/08/2013   .  Polyneuropathy in other diseases classified elsewhere  01/08/2013   .  Abdominal aneurysm without mention of rupture  02/01/2012   .  Hypokalemia  01/25/2012   .  Anemia due to acute blood loss  01/24/2012   .  Benign neoplasm of colon  01/24/2012   .  Hypertension  01/19/2012   .  Other and unspecified hyperlipidemia  01/18/2012   .  Impaired fasting glucose  01/18/2012   .  Unspecified hereditary and idiopathic peripheral neuropathy  01/18/2012   .  Hypertonicity of bladder  01/18/2012   .  Diverticulosis  01/18/2012   .  Rectal bleeding  01/18/2012   .  GERD  02/09/2010   .  COLONIC POLYPS, ADENOMATOUS, HX OF  02/09/2010     Expected Discharge Date: Expected Discharge Date:  04/15/14  Team Members Present: Physician leading conference: Dr. Alysia Penna Social Worker Present: Ovidio Kin, LCSW Nurse Present: Elliot Cousin, RN PT Present: Raylene Everts, PT;Blair Hobble, Renaye Rakers, PT OT Present: Antony Salmon, OT SLP Present: Other (comment) Elmyra Ricks Page-SPT) PPS Coordinator present : Ileana Ladd, Lelan Pons, RN, CRRN        Current Status/Progress  Goal  Weekly Team Focus   Medical     R PCA, mild neglect, HOH, occ cog cues, receptive to cues  Mod I meds, reduce impulsivity  eval for UTI, eval bladder incont   Bowel/Bladder     Incontinent of bowel and bladder, Miralax 17g daily, sorbitol 75ml prn. Condom cath  Stay free of infection and skin break down.  Monitor bowel movements and output daily.   Swallow/Nutrition/ Hydration     upraded to regular solids, thin liquids with intermittent supervision   mod I with least restrictive diet   diet tolerance    ADL's     steady A with basic ADLs., mod A with ambulation/ transfers with RW  mod I with basic ADLs, mod I toilet transfer, supervision for shower transfer, light meal prep and homemaking  ADL retraining, balance activities, UE coordination and strength, pt education   Mobility  Mod A transfers and gait x45' with RW, min A bed mobility, S WC x150', 15/56 Berg balance test  Supervision gait x150', Mod I bed mobility and transfers, Mod I WC x150' and 37/56 Berg  transfer training, strengthening, gait with RW, static and dynamic balance   Communication     n/a  n/a  n/a   Safety/Cognition/ Behavioral Observations    min-mod assist for memory, executive function, safety awareness/insight   min assist-supervision   strategy education    Pain     Tylenol 325-650 q4h prn. Pain level 0 on a scale of 0-10  Continue to monitor for verbalizing or nonverbal signs of pain Q shift  Patient at <3 on pain scale.   Skin     N/A  Keep skin free of infection and breakdown.  Monitor skin q shift.      *See Care Plan and progress notes for long and short-term goals.    Barriers to Discharge:  Needs 24/7 plan      Possible Resolutions to Barriers:    sons in [place] and [place]      Discharge Planning/Teaching Needs:    Home with caregiver and son to assit, still coming up with a plan      Team Discussion:    Treating UTI-balance and safety issues-will recommend 24 hr supervision at discharge.  Making progress, but will need to decide hired assist versus ALF   Revisions to Treatment Plan:    None    Continued Need for Acute Rehabilitation Level of Care: The patient requires daily medical management by a physician with specialized training in physical medicine and rehabilitation for the following conditions: Daily direction of a multidisciplinary physical rehabilitation program to ensure safe treatment while eliciting the highest outcome that is of practical value to the patient.: Yes Daily medical management of patient stability for increased activity during participation in an intensive rehabilitation regime.: Yes Daily analysis of laboratory values and/or radiology reports with any subsequent need for medication adjustment of medical intervention for : Neurological problems  Elease Hashimoto 04/03/2014, 2:54 PM          Patient ID: Carl Rios, male   DOB: [birth year], 80 y.o.   MRN: [id number]

## 2014-04-03 NOTE — IPOC Note (Signed)
Overall Plan of Care [facility]) Patient Details Name: Carl Rios MRN: [id number] DOB: [birth year]  Admitting Diagnosis: polytrauma  Hospital Problems: Active Problems:   CVA (cerebral infarction)     Functional Problem List: Nursing Bowel;Edema;Safety;Sensory;Skin Integrity;Bladder;Medication Management;Nutrition;Pain  PT Balance;Endurance;Motor;Perception;Pain;Safety;Sensory  OT Balance;Cognition;Endurance;Motor;Perception;Safety  SLP Cognition;Safety  TR         Basic ADL's: OT Grooming;Bathing;Dressing;Toileting     Advanced  ADL's: OT Simple Meal Preparation;Light Housekeeping     Transfers: PT Bed Mobility;Bed to Chair;Car;Furniture;Floor  OT Toilet;Tub/Shower     Locomotion: PT Ambulation;Wheelchair Mobility;Stairs     Additional Impairments: OT Fuctional Use of Upper Extremity  SLP Social Cognition;Swallowing   Problem Solving;Memory;Awareness  TR      Anticipated Outcomes Item Anticipated Outcome  Self Feeding I  Swallowing  mod I  with least restrictive diet    Basic self-care  mod I   Toileting  mod I   Bathroom Transfers supervision to shower, mod I to toilet  Bowel/Bladder  cont of bowel and bladdder  Transfers  Modified independent basic transfers, Supervision car   Locomotion  Supervision x150' with LRAD, S/min A stairs  Communication  n/a  Cognition  supervision   Pain  less than 3  Safety/Judgment  adhere to safety protocol in place   Therapy Plan: PT Intensity: Minimum of 1-2 x/day ,45 to 90 minutes PT Frequency: 5 out of 7 days PT Duration Estimated Length of Stay: 10-14 days OT Intensity: Minimum of 1-2 x/day, 45 to 90 minutes OT Frequency: 5 out of 7 days OT Duration/Estimated Length of Stay: 14-16 days SLP Intensity: Minumum of 1-2 x/day, 30 to 90 minutes SLP Frequency: 5 out of 7 days SLP Duration/Estimated Length of Stay: 10-14 days       Team Interventions: Nursing Interventions Pain Management;Discharge  Planning;Bladder Management;Skin Care/Wound Management;Medication Management;Patient/Family Education;Psychosocial Support;Disease Management/Prevention;Bowel Management  PT interventions Ambulation/gait training;Balance/vestibular training;Cognitive remediation/compensation;Community reintegration;Discharge planning;Disease management/prevention;DME/adaptive equipment instruction;Functional mobility training;Neuromuscular re-education;Pain management;Patient/family education;Psychosocial support;Stair training;Therapeutic Activities;Therapeutic Exercise;UE/LE Strength taining/ROM;UE/LE Coordination activities;Visual/perceptual remediation/compensation;Wheelchair propulsion/positioning  OT Interventions Balance/vestibular training;Cognitive remediation/compensation;Discharge planning;DME/adaptive equipment instruction;Community reintegration;Functional mobility training;Neuromuscular re-education;Patient/family education;Psychosocial support;Self Care/advanced ADL retraining;Therapeutic Exercise;Therapeutic Activities;UE/LE Strength taining/ROM;UE/LE Coordination activities;Visual/perceptual remediation/compensation  SLP Interventions Cognitive remediation/compensation;Cueing hierarchy;Patient/family education;Therapeutic Activities;Internal/external aids;Environmental controls;Functional tasks  TR Interventions    SW/CM Interventions Discharge Planning;Psychosocial Support;Patient/Family Education    Team Discharge Planning: Destination: PT-Home ,OT- Home , SLP- (TBD) Projected Follow-up: PT-Home health PT;24 hour supervision/assistance, OT-  Home health OT, SLP-Home Health SLP;24 hour supervision/assistance Projected Equipment Needs: PT-Rolling walker with 5" wheels, OT- 3 in 1 bedside comode;Tub/shower seat, SLP-  Equipment Details: PT-Pt likely to benefit from RW for fall risk reduction, OT-Equipment will be provided by patient's son. Patient/family involved in discharge planning: PT- Patient,  OT-  , SLP-Patient  MD ELOS: 10-12 d Medical Rehab Prognosis:  Good Assessment: 80 y.o. right-handed male history of peripheral neuropathy. Presented 03/26/2014 after a fall and remained down for approximately 12 hours until found by family. Patient independent prior to admission living alone and driving. Noted total CK of 1550, troponin negative, white blood cell count 14,100 and BUN 27. Cranial CT scan with no evidence of intracranial abnormality noted chronic lacunar infarct at the right thalamus. Patient did not receive TPA. The thoracic and lumbar spines negative for acute changes. Pelvis films without fracture. MRI of the brain revealed an acute right PCA infarct. Carotid Dopplers with no ICA stenosis. Echocardiogram with ejection fraction of 56% grade 1 diastolic dysfunction  Now requiring 24/7 Rehab RN,MD, as well as  CIR level PT, OT and SLP.  Treatment team will focus on ADLs and mobility with goals set at Sup/Mod I   See Team Conference Notes for weekly updates to the plan of care

## 2014-04-03 NOTE — Progress Notes (Signed)
Speech Language Pathology Daily Session Note  Patient Details  Name: Carl Rios MRN: [id number] Date of Birth: [birth year]  Today's Date: 04/03/2014 Time: 9509-3267 Time Calculation (min): 43 min  Short Term Goals: Week 1: SLP Short Term Goal 1 (Week 1): Pt will improve working memory during functional tasks for 80% accuracy and min assist as needed.  SLP Short Term Goal 2 (Week 1): Pt will identify at least 2 physical deficits and 1 cognitive deficit and their impact on his functional independence at home with min assist.  SLP Short Term Goal 3 (Week 1): Pt will improve executive function for planning, thought organization, mental flexibility and error awareness during functional tasks for 80% accuracy and min assist as needed.    Skilled Therapeutic Interventions:  Pt was seen for skilled speech therapy targeting executive function for financial management.  Pt was 25% modified independent for loading pills into a pill box, improving to 50% accuracy with min assist, and 100% accuracy with mod assist for thought organization and planning.  Pt required more cuing to sort BID medications versus once daily; however, with improved task familiarity pt was overall min assist-supervision.  SLP provided recommendations for assistance for medications and finances upon discharge. Additionally, pt propelled his wheelchair from the day room to his room with min cuing for recall for functional way finding.  Continue per current plan of care.    FIM:  Comprehension Comprehension Mode: Auditory Comprehension: 5-Follows basic conversation/direction: With extra time/assistive device Expression Expression Mode: Verbal Expression: 5-Expresses basic needs/ideas: With extra time/assistive device Social Interaction Social Interaction: 6-Interacts appropriately with others with medication or extra time (anti-anxiety, antidepressant). Problem Solving Problem Solving: 4-Solves basic 75 - 89% of the time/requires  cueing 10 - 24% of the time Memory Memory: 4-Recognizes or recalls 75 - 89% of the time/requires cueing 10 - 24% of the time  Pain Pain Assessment Pain Assessment: No/denies pain  Therapy/Group: Individual Therapy  [name], M.A. CCC-SLP  Selinda Orion Shevawn Langenberg 04/03/2014, 12:20 PM

## 2014-04-04 ENCOUNTER — Inpatient Hospital Stay (HOSPITAL_COMMUNITY): Payer: Medicare Other | Admitting: Occupational Therapy

## 2014-04-04 ENCOUNTER — Inpatient Hospital Stay (HOSPITAL_COMMUNITY): Payer: Medicare Other | Admitting: Speech Pathology

## 2014-04-04 ENCOUNTER — Inpatient Hospital Stay (HOSPITAL_COMMUNITY): Payer: Medicare Other

## 2014-04-04 DIAGNOSIS — R269 Unspecified abnormalities of gait and mobility: Secondary | ICD-10-CM

## 2014-04-04 DIAGNOSIS — M6282 Rhabdomyolysis: Secondary | ICD-10-CM

## 2014-04-04 DIAGNOSIS — I10 Essential (primary) hypertension: Secondary | ICD-10-CM

## 2014-04-04 DIAGNOSIS — I6330 Cerebral infarction due to thrombosis of unspecified cerebral artery: Secondary | ICD-10-CM

## 2014-04-04 LAB — URINE CULTURE: Colony Count: >=100000

## 2014-04-04 NOTE — Progress Notes (Signed)
Subjective/Complaints: 80 y.o. right-handed male history of peripheral neuropathy. Presented 03/26/2014 after a fall and remained down for approximately 12 hours until found by family. Patient independent prior to admission living alone and driving. Noted total CK of 1550, troponin negative, white blood cell count 14,100 and BUN 27. Cranial CT scan with no evidence of intracranial abnormality noted chronic lacunar infarct at the right thalamus. Patient did not receive TPA. The thoracic and lumbar spines negative for acute changes. Pelvis films without fracture. MRI of the brain revealed an acute right PCA infarct. Carotid Dopplers with no ICA stenosis. Echocardiogram with ejection fraction of 50% grade 1 diastolic dysfunction  Pt without new c/os , aware of D/C next week  Review of Systems - neg except weakness Objective: Vital Signs: Blood pressure 162/75, pulse 86, temperature 98.3 F (36.8 C), temperature source Tympanic, resp. rate 18, height 5' 8"  (1.727 m), weight 83.008 kg (183 lb), SpO2 96.00%. No results found. Results for orders placed during the hospital encounter of 04/01/14 (from the past 72 hour(s))  CBC WITH DIFFERENTIAL     Status: None   Collection Time    04/02/14  7:00 AM      Result Value Ref Range   WBC 9.0  4.0 - 10.5 K/uL   RBC 4.73  4.22 - 5.81 MIL/uL   Hemoglobin 14.1  13.0 - 17.0 g/dL   HCT 42.7  39.0 - 52.0 %   MCV 90.3  78.0 - 100.0 fL   MCH 29.8  26.0 - 34.0 pg   MCHC 33.0  30.0 - 36.0 g/dL   RDW 15.0  11.5 - 15.5 %   Platelets 294  150 - 400 K/uL   Neutrophils Relative % 73  43 - 77 %   Neutro Abs 6.5  1.7 - 7.7 K/uL   Lymphocytes Relative 19  12 - 46 %   Lymphs Abs 1.7  0.7 - 4.0 K/uL   Monocytes Relative 5  3 - 12 %   Monocytes Absolute 0.5  0.1 - 1.0 K/uL   Eosinophils Relative 3  0 - 5 %   Eosinophils Absolute 0.2  0.0 - 0.7 K/uL   Basophils Relative 0  0 - 1 %   Basophils Absolute 0.0  0.0 - 0.1 K/uL  COMPREHENSIVE METABOLIC PANEL     Status:  Abnormal   Collection Time    04/02/14  7:00 AM      Result Value Ref Range   Sodium 139  137 - 147 mEq/L   Potassium 5.3  3.7 - 5.3 mEq/L   Comment: HEMOLYSIS AT THIS LEVEL MAY AFFECT RESULT   Chloride 102  96 - 112 mEq/L   CO2 26  19 - 32 mEq/L   Glucose, Bld 113 (*) 70 - 99 mg/dL   BUN 18  6 - 23 mg/dL   Creatinine, Ser 0.87  0.50 - 1.35 mg/dL   Calcium 9.3  8.4 - 10.5 mg/dL   Total Protein 6.3  6.0 - 8.3 g/dL   Albumin 3.2 (*) 3.5 - 5.2 g/dL   AST 32  0 - 37 U/L   Comment: HEMOLYSIS AT THIS LEVEL MAY AFFECT RESULT   ALT 26  0 - 53 U/L   Comment: HEMOLYSIS AT THIS LEVEL MAY AFFECT RESULT   Alkaline Phosphatase 87  39 - 117 U/L   Total Bilirubin 0.7  0.3 - 1.2 mg/dL   GFR calc non Af Amer 76 (*) >90 mL/min   GFR calc Af Amer 88 (*) >  90 mL/min   Comment: (NOTE)     The eGFR has been calculated using the CKD EPI equation.     This calculation has not been validated in all clinical situations.     eGFR's persistently <90 mL/min signify possible Chronic Kidney     Disease.  URINALYSIS, ROUTINE W REFLEX MICROSCOPIC     Status: Abnormal   Collection Time    04/02/14  4:22 PM      Result Value Ref Range   Color, Urine YELLOW  YELLOW   APPearance CLOUDY (*) CLEAR   Specific Gravity, Urine 1.020  1.005 - 1.030   pH 5.5  5.0 - 8.0   Glucose, UA NEGATIVE  NEGATIVE mg/dL   Hgb urine dipstick TRACE (*) NEGATIVE   Bilirubin Urine NEGATIVE  NEGATIVE   Ketones, ur NEGATIVE  NEGATIVE mg/dL   Protein, ur NEGATIVE  NEGATIVE mg/dL   Urobilinogen, UA 0.2  0.0 - 1.0 mg/dL   Nitrite POSITIVE (*) NEGATIVE   Leukocytes, UA MODERATE (*) NEGATIVE  URINE CULTURE     Status: None   Collection Time    04/02/14  4:22 PM      Result Value Ref Range   Specimen Description URINE, CLEAN CATCH     Special Requests NONE     Culture  Setup Time       Value: 04/02/2014 17:45     Performed at [facility]       Value: >=100,000 COLONIES/ML     Performed at [facility]    Culture       Value: ESCHERICHIA COLI     Performed at [facility]   Report Status PENDING    URINE MICROSCOPIC-ADD ON     Status: Abnormal   Collection Time    04/02/14  4:22 PM      Result Value Ref Range   WBC, UA 21-50  <3 WBC/hpf   RBC / HPF 0-2  <3 RBC/hpf   Bacteria, UA MANY (*) RARE     HEENT: normal Cardio: RRR and no murmurs Resp: CTA B/L and unlabored GI: BS positive and NT Extremity:  Pulses positive and No Edema Skin:   Intact Neuro: Alert/Oriented, Cranial Nerve II-XII normal, Normal Sensory, Normal Motor and Abnormal FMC Ataxic/ dec FMC Musc/Skel:  Normal Gen NAD   Assessment/Plan: 1. Functional deficits secondary to R PCA infarct which require 3+ hours per day of interdisciplinary therapy in a comprehensive inpatient rehab setting. Physiatrist is providing close team supervision and 24 hour management of active medical problems listed below. Physiatrist and rehab team continue to assess barriers to discharge/monitor patient progress toward functional and medical goals.  FIM: FIM - Bathing Bathing Steps Patient Completed: Chest;Right Arm;Left Arm;Buttocks;Front perineal area;Abdomen;Right upper leg;Left upper leg;Right lower leg (including foot);Left lower leg (including foot) Bathing: 4: Steadying assist  FIM - Upper Body Dressing/Undressing Upper body dressing/undressing steps patient completed: Thread/unthread right sleeve of pullover shirt/dresss;Thread/unthread left sleeve of pullover shirt/dress;Put head through opening of pull over shirt/dress;Pull shirt over trunk Upper body dressing/undressing: 5: Set-up assist to: Obtain clothing/put away FIM - Lower Body Dressing/Undressing Lower body dressing/undressing steps patient completed: Thread/unthread right pants leg;Thread/unthread left pants leg;Pull pants up/down;Fasten/unfasten pants;Don/Doff right shoe;Don/Doff left shoe Lower body dressing/undressing: 5: Supervision: Safety issues/verbal  cues  FIM - Toileting Toileting steps completed by patient: Adjust clothing prior to toileting;Performs perineal hygiene Toileting Assistive Devices: Grab bar or rail for support Toileting: 3: Mod-Patient completed 2 of  3 steps  FIM - Radio producer Devices: Walker;Elevated toilet seat;Grab bars Toilet Transfers: 4-From toilet/BSC: Min A (steadying Pt. > 75%);4-To toilet/BSC: Min A (steadying Pt. > 75%)  FIM - Bed/Chair Transfer Bed/Chair Transfer Assistive Devices: Arm rests;Bed rails Bed/Chair Transfer: 4: Sit > Supine: Min A (steadying pt. > 75%/lift 1 leg);4: Bed > Chair or W/C: Min A (steadying Pt. > 75%)  FIM - Locomotion: Wheelchair Distance: 175 Locomotion: Wheelchair: 1: Travels less than 50 ft with supervision, cueing or coaxing FIM - Locomotion: Ambulation Locomotion: Ambulation Assistive Devices: Administrator Ambulation/Gait Assistance: 4: Min assist Locomotion: Ambulation: 4: Travels 150 ft or more with minimal assistance (Pt.>75%)  Comprehension Comprehension Mode: Auditory Comprehension: 5-Understands basic 90% of the time/requires cueing < 10% of the time  Expression Expression Mode: Verbal Expression: 5-Expresses basic needs/ideas: With extra time/assistive device  Social Interaction Social Interaction: 6-Interacts appropriately with others with medication or extra time (anti-anxiety, antidepressant).  Problem Solving Problem Solving: 4-Solves basic 75 - 89% of the time/requires cueing 10 - 24% of the time  Memory Memory: 4-Recognizes or recalls 75 - 89% of the time/requires cueing 10 - 24% of the time   Medical Problem List and Plan:  1. Functional deficits secondary to right PCA infarct/rhabdomyolysis  2. DVT Prophylaxis/Anticoagulation: Subcutaneous Lovenox. Monitor platelet counts and any signs of bleeding  3. Pain Management: Tylenol as needed, heat/stretching/strengthening for quads/proximal LE's  4. Neuropsych: This  patient is capable of making decisions on his own behalf.  5. History of peripheral neuropathy. Continue Cymbalta.  6. Hypertension. Toprol-XL 12.5 mg daily. Monitor with increased mobility  7.  UTI, empiric abx until Cx back 8.  HyperK (5.3)  hemolysis , repeat LOS (Days) 3 A FACE TO FACE EVALUATION WAS PERFORMED  Charlett Blake 04/04/2014, 7:31 AM

## 2014-04-04 NOTE — Progress Notes (Signed)
Occupational Therapy Session Note  Patient Details  Name: Carl Rios MRN: [id number] Date of Birth: [birth year]  Today's Date: 04/04/2014 Time: 0810-0905 and 1300-1330 Time Calculation (min): 55 min and 30 min  Short Term Goals: Week 1:  OT Short Term Goal 1 (Week 1): Pt will be able to stand to adjust clothing before and after toileting with supervision only. OT Short Term Goal 2 (Week 1): Pt will be able to transfer to toilet using RW with supervision only.   OT Short Term Goal 3 (Week 1): Pt will be able to stand in shower with close S demonstrating improved balance without posterior or L lean. OT Short Term Goal 4 (Week 1): Pt will be able to do room exercise program for LUE with min cues.  Skilled Therapeutic Interventions/Progress Updates:    Visit 1:  Pain: No c/o pain Tx:   Pt seen for BADL retraining of toileting, bathing, and dressing with a focus on balance, safety awareness, and activity tolerance. Pt was eating breakfast upon arrival sitting in recliner.  He then needed to toilet.  It took a great deal of time for him to complete his breakfast and toilet. Pt then bathed with close supervision and steady A for LB dressing as pt continually had a posterior lean, relying on support from the recliner on the back of his legs to keep him from falling. To increase forward weight shift over toes, pt worked on heel raises with Ue support on RW in barefeet 10x. Pt did well with exercise.  Worked on stand to sit without UE support to force use of legs. Pt resting in recliner with QRB and call light in reach.   Visit 2: Pain:no c/o pain Tx: Pt seen this session for there ex to focus on activity tolerance and balance. Pt resting in bed at start of session. He required min A from supine to sit.  Pt ambulated to gym with min A for cues for L side awareness. Pt worked on sit><stand skills focusing on not using LE support against the standing surface. This was challenging for the pt but he was able  to complete ex with min cues.  Pt worked on maintaining balance without posterior lean and then walked back to his room. Pt sat in his recliner with QRB on and call light in reach.  Therapy Documentation Precautions:  Precautions Precautions: Fall Precaution Comments: HOH Restrictions Weight Bearing Restrictions: No     Pain Assessment Pain Assessment: No/denies pain Pain Score: 0-No pain ADL: ADL ADL Comments: Refer to FIM  See FIM for current functional status  Therapy/Group: Individual Therapy  Geoffery Lyons Rewa Weissberg 04/04/2014, 9:22 AM

## 2014-04-04 NOTE — Progress Notes (Signed)
Speech Language Pathology Daily Session Note  Patient Details  Name: Carl Rios MRN: [id number] Date of Birth: May 08, 1926  Today's Date: 04/04/2014 Time: 1194-1740 Time Calculation (min): 42 min  Short Term Goals: Week 1: SLP Short Term Goal 1 (Week 1): Pt will improve working memory during functional tasks for 80% accuracy and min assist as needed.  SLP Short Term Goal 2 (Week 1): Pt will identify at least 2 physical deficits and 1 cognitive deficit and their impact on his functional independence at home with min assist.  SLP Short Term Goal 3 (Week 1): Pt will improve executive function for planning, thought organization, mental flexibility and error awareness during functional tasks for 80% accuracy and min assist as needed.    Skilled Therapeutic Interventions:  Pt was seen for skilled speech therapy targeting executive function for financial management.  Pt required mod faded to min assist for sorting transactions into chronological order due to mild impulsivity and decreased thought organization.  Pt was noted with good error awareness during the aforementioned task and identified two errors with supervision faded to modified independence.  Pt completed functional math calculations with coins and bills, including correctly counting a targeted amount of bills and coins and making change with min assist verbal and visual cues for working memory and organization.  During a structured new learning task, pt was 100% accurate with min cuing faded to mod I for alternating attention and visual scanning between left and right fields.  Continue per current plan of care.    FIM:  Comprehension Comprehension Mode: Auditory Comprehension: 5-Follows basic conversation/direction: With extra time/assistive device Expression Expression Mode: Verbal Expression: 5-Expresses basic needs/ideas: With extra time/assistive device Social Interaction Social Interaction: 6-Interacts appropriately with others  with medication or extra time (anti-anxiety, antidepressant). Problem Solving Problem Solving: 5-Solves basic 90% of the time/requires cueing < 10% of the time Memory Memory: 4-Recognizes or recalls 75 - 89% of the time/requires cueing 10 - 24% of the time  Pain Pain Assessment Pain Assessment: No/denies pain  Therapy/Group: Individual Therapy  Selinda Orion Emoni Yang 04/04/2014, 4:20 PM

## 2014-04-04 NOTE — Progress Notes (Signed)
Physical Therapy Session Note  Patient Details  Name: Carl Rios MRN: [id number] Date of Birth: 1926-03-05  Today's Date: 04/04/2014 Time: 2878-6767 Time Calculation (min): 60 min  Short Term Goals: Week 1:  PT Short Term Goal 1 (Week 1): STG=LTG due to short LOS, Supervision gait x150' and stairs, Mod I transfers  Skilled Therapeutic Interventions/Progress Updates:   Pt stated he wanted to use toilet before session started.  He did not remember that he had a UTI or was started yesterday on ABX.  Pt slightly impulsive during sit> stand to RW, gait in room and toilet transfer, with min/mod assist.  Unsafe hand placement on RW during toileting.  Standing balance during bil hand use for clothing management, supervision with pt stabilizing by pressing LEs against toilet, and wide stance.  Tx focused on neuromuscular re-education via forced use, demo, manual and VCS for L stance stability with L step/taps with L UE support, balance retraining in standing biased toward L, self stretching bil hamstrings in sitting, NUStep at level 4 x 10 minutes for coordinated alternating reciprocal movements. Ex HR 104, O2 sats 97%, rated 13 on BORG scale.  Gait with RW x 150' x 2 to /from room , with min guard assist, min assist for turns.  Pt returned to room and quick release applied while he rested in recliner.  All needs left within reach.    Therapy Documentation Precautions:  Precautions Precautions: Fall Precaution Comments: HOH Restrictions Weight Bearing Restrictions: No   Pain: Pain Assessment Pain Assessment: No/denies pain Pain Score: 0-No pain  See FIM for current functional status  Therapy/Group: Individual Therapy  Frederic Jericho 04/04/2014, 12:26 PM

## 2014-04-05 ENCOUNTER — Inpatient Hospital Stay (HOSPITAL_COMMUNITY): Payer: Medicare Other | Admitting: *Deleted

## 2014-04-05 ENCOUNTER — Ambulatory Visit (HOSPITAL_COMMUNITY): Payer: Medicare Other | Admitting: Speech Pathology

## 2014-04-05 ENCOUNTER — Inpatient Hospital Stay (HOSPITAL_COMMUNITY): Payer: Medicare Other | Admitting: Occupational Therapy

## 2014-04-05 NOTE — Progress Notes (Signed)
Occupational Therapy Session Note  Patient Details  Name: JOSIAS TOMERLIN MRN: [id number] Date of Birth: [birth year]  Today's Date: 04/05/2014 Time: 1030-1130 Time Calculation (min): 60 min  Short Term Goals: Week 1:  OT Short Term Goal 1 (Week 1): Pt will be able to stand to adjust clothing before and after toileting with supervision only. OT Short Term Goal 2 (Week 1): Pt will be able to transfer to toilet using RW with supervision only.   OT Short Term Goal 3 (Week 1): Pt will be able to stand in shower with close S demonstrating improved balance without posterior or L lean. OT Short Term Goal 4 (Week 1): Pt will be able to do room exercise program for LUE with min cues.  Skilled Therapeutic Interventions/Progress Updates:      Pt seen for BADL retraining of bathing and dressing with a focus on dynamic balance and awareness of midline/upright postural control.  Pt demonstrated much improved balance with no posterior lean.  He only required supervision during entire bathing and dressing process. Pt completed shaving with an Copy.  Pt worked on standing not using the back of chair for support and walked a loop around the unit. Pt continues to have some difficulty with L inattention during gait in that his RW drifts toward his R and his left foot kicks the walker.  Pt responded well to cues to stay "in the middle".  Pt resting in w/c with call light in reach and QRB on.  Therapy Documentation Precautions:  Precautions Precautions: Fall Precaution Comments: HOH Restrictions Weight Bearing Restrictions: No    Vital Signs: Therapy Vitals Pulse Rate: 75 BP: 143/82 mmHg Oxygen Therapy SpO2: 98 % O2 Device: None (Room air) Pain: Pain Assessment Pain Assessment: No/denies pain ADL: ADL ADL Comments: Refer to FIM  See FIM for current functional status  Therapy/Group: Individual Therapy  Harlene Ramus 04/05/2014, 12:29 PM

## 2014-04-05 NOTE — Progress Notes (Signed)
Physical Therapy Session Note  Patient Details  Name: Carl Rios MRN: [id number] Date of Birth: 1926/10/24  Today's Date: 04/05/2014 Time: 9:02-9:45 (77min) and 14:55-15:25 (60min)   Short Term Goals: Week 1:  PT Short Term Goal 1 (Week 1): STG=LTG due to short LOS, Supervision gait x150' and stairs, Mod I transfers  Skilled Therapeutic Interventions/Progress Updates:  First tx focused on HS stretching, sit<>stands, gait with RW, balance reactions, and stairs. Pt had fluctuating HR during tx, dropping to 48bpm after exercise, and up to 91bpm with some BP fluctuations as well. See flowsheet and continue to monitor. Nursing made aware and reported recent BP meds administered.   Performed gait training in controlled setting 2x150' with close S and noticeed 3 miinor collisions to L side.   Instructed pt in seated HS self-strech, leaning forward over LE on stool 2x73min each side with cues for technique and education on relationship b/t HS and balance.   Engaged pt in standing balance in static conditions and NMR via manual resistance at hips for perturbations and balance reactions. Decreased L hip ext noted relative to R, but pt able to adjust with verbal and tactile cues about 75%. Pt wa able to use hip and hip strategy 5/5 trials with no LOB posteriorly.   Stair training 2x5 with bil rails and Min A for steadying with cues for safe sequence.   Pt left up in recliner with all needs in reach.  --------------------------------------------- Second tx focused on gait with RW, functional mobility, stairs and curb. Son, Carl Rios present and asking about therapy suggestions for DC disposition. Son was updated on pt's progress as well as concerns with balance at DC limiting safety and increasing falls risk. 24/7 supervision was recommended for indefinite time whether at home with hired help or at [facility].   Pt provided demo for positioning in RW due to unsafe L positioning. Pt engaged in gait 2x200' and  2x100' with RW and close S with cues again for positioning. Pt challenged with head turns and changes in gait speed, which caused no LOB, but pt did kick RW x2. Changed his walker to wider model for safety.   Performed static standing balance with feet in parallel and semi-tandem with only min A needed, improved since Tuesday.   Pt performed car transfer with S and safe technique noted.  Ascended/descended 4 stairs with 2 rails and min A for steadying, needing safety cues to place feet on step fully.   Pt needed up to [place] for transfers from furniture today for lifting assist and safety cues for RW set-up.  Pt left up in recliner with lap belt and all needs in reach.      Therapy Documentation Precautions:  Precautions Precautions: Fall Precaution Comments: HOH Restrictions Weight Bearing Restrictions: No General:   Vital Signs: Therapy Vitals Temp: 96.9 F (36.1 C) Temp src: Oral Pulse Rate: 91 Resp: 19 BP: 143/77 mmHg Patient Position (if appropriate): Lying Oxygen Therapy SpO2: 98 % O2 Device: None (Room air) Pain: Pain Assessment Pain Assessment: No/denies pain  See FIM for current functional status  Therapy/Group: Individual Therapy Kennieth Rad, PT, DPT  04/05/2014, 9:21 AM

## 2014-04-05 NOTE — Progress Notes (Signed)
Speech Language Pathology Daily Session Note  Patient Details  Name: Carl Rios MRN: [id number] Date of Birth: 01/17/1926  Today's Date: 04/05/2014 Time: 0800-0855 Time Calculation (min): 55 min  Short Term Goals: Week 1: SLP Short Term Goal 1 (Week 1): Pt will improve working memory during functional tasks for 80% accuracy and min assist as needed.  SLP Short Term Goal 2 (Week 1): Pt will identify at least 2 physical deficits and 1 cognitive deficit and their impact on his functional independence at home with min assist.  SLP Short Term Goal 3 (Week 1): Pt will improve executive function for planning, thought organization, mental flexibility and error awareness during functional tasks for 80% accuracy and min assist as needed.    Skilled Therapeutic Interventions:  Pt was seen for skilled speech therapy for dysphagia management and executive function.  Pt was observed with presentations of his prescribed diet which was recently advanced and exhibited adequate mastication and oral clearance of regular solids, no overt s/s of aspiration with solids or liquids.  Recommend that pt continue on a regular diet with thin liquids, intermittent supervision for swallowing precautions.  When engaged in a functional new learning task targeting executive function and abstract problem solving, pt required mod-max assist for mental flexibility and thought organization.  Suspect that pt's difficulties were largely related to his hearing impairments and working memory as he was noted with marked improvements for decision making and problem solving when provided with written visual cues.  At the end of the session, SLP assisted pt with mod cuing for safety awareness during toileting, supervision for basic grooming tasks while standing at sinkside.  Continue per current plan of care.    FIM:  Comprehension Comprehension Mode: Auditory Comprehension: 5-Follows basic conversation/direction: With extra time/assistive  device Expression Expression Mode: Verbal Expression: 5-Expresses basic needs/ideas: With extra time/assistive device Social Interaction Social Interaction: 6-Interacts appropriately with others with medication or extra time (anti-anxiety, antidepressant). Problem Solving Problem Solving: 4-Solves basic 75 - 89% of the time/requires cueing 10 - 24% of the time Memory Memory: 4-Recognizes or recalls 75 - 89% of the time/requires cueing 10 - 24% of the time FIM - Eating Eating Activity: 6: More than reasonable amount of time  Pain Pain Assessment Pain Assessment: No/denies pain  Therapy/Group: Individual Therapy  [name], M.A. CCC-SLP   Selinda Orion Eriel Doyon 04/05/2014, 1:01 PM

## 2014-04-05 NOTE — Progress Notes (Signed)
Social Work Patient ID: Carl Rios, male   DOB: 06/18/1926, 80 y.o.   MRN: [id number] Met with pt and son-Rod to discuss discharge needs.  Pt is doing well and really wants to go home upon discharge. Son to stay and go through PT with pt and ask questions regarding of how long would pt need someone with him and what She sees in therapy with pt.  Cole-PT aware of the questions and concerns and can address when in therapy today.  Son has names of  Private duty agencies to contact and he plans to today or Monday.  Will work toward getting pt home with private duty caregivers and this will allow  Time to see how he does and to pursue ALF if this is decided upon.

## 2014-04-05 NOTE — Progress Notes (Signed)
Subjective/Complaints: 80 y.o. right-handed male history of peripheral neuropathy. Presented 03/26/2014 after a fall and remained down for approximately 12 hours until found by family. Patient independent prior to admission living alone and driving. Noted total CK of 1550, troponin negative, white blood cell count 14,100 and BUN 27. Cranial CT scan with no evidence of intracranial abnormality noted chronic lacunar infarct at the right thalamus. Patient did not receive TPA. The thoracic and lumbar spines negative for acute changes. Pelvis films without fracture. MRI of the brain revealed an acute right PCA infarct. Carotid Dopplers with no ICA stenosis. Echocardiogram with ejection fraction of 80% grade 1 diastolic dysfunction  No bladder c/os  Review of Systems - neg except weakness Objective: Vital Signs: Blood pressure 143/77, pulse 84, temperature 96.9 F (36.1 C), temperature source Oral, resp. rate 19, height 5\' 8"  (1.727 m), weight 83.008 kg (183 lb), SpO2 98.00%. No results found. Results for orders placed during the hospital encounter of 04/01/14 (from the past 72 hour(s))  URINALYSIS, ROUTINE W REFLEX MICROSCOPIC     Status: Abnormal   Collection Time    04/02/14  4:22 PM      Result Value Ref Range   Color, Urine YELLOW  YELLOW   APPearance CLOUDY (*) CLEAR   Specific Gravity, Urine 1.020  1.005 - 1.030   pH 5.5  5.0 - 8.0   Glucose, UA NEGATIVE  NEGATIVE mg/dL   Hgb urine dipstick TRACE (*) NEGATIVE   Bilirubin Urine NEGATIVE  NEGATIVE   Ketones, ur NEGATIVE  NEGATIVE mg/dL   Protein, ur NEGATIVE  NEGATIVE mg/dL   Urobilinogen, UA 0.2  0.0 - 1.0 mg/dL   Nitrite POSITIVE (*) NEGATIVE   Leukocytes, UA MODERATE (*) NEGATIVE  URINE CULTURE     Status: None   Collection Time    04/02/14  4:22 PM      Result Value Ref Range   Specimen Description URINE, CLEAN CATCH     Special Requests NONE     Culture  Setup Time       Value: 04/02/2014 17:45     Performed at [facility]       Value: >=100,000 COLONIES/ML     Performed at [facility]   Culture       Value: ESCHERICHIA COLI     Performed at [facility]   Report Status 04/04/2014 FINAL     Organism ID, Bacteria ESCHERICHIA COLI    URINE MICROSCOPIC-ADD ON     Status: Abnormal   Collection Time    04/02/14  4:22 PM      Result Value Ref Range   WBC, UA 21-50  <3 WBC/hpf   RBC / HPF 0-2  <3 RBC/hpf   Bacteria, UA MANY (*) RARE     HEENT: normal Cardio: RRR and no murmurs Resp: CTA B/L and unlabored GI: BS positive and NT Extremity:  Pulses positive and No Edema Skin:   Intact Neuro: Alert/Oriented, Cranial Nerve II-XII normal, Normal Sensory, Normal Motor and Abnormal FMC Ataxic/ dec FMC Musc/Skel:  Normal Gen NAD   Assessment/Plan: 1. Functional deficits secondary to R PCA infarct which require 3+ hours per day of interdisciplinary therapy in a comprehensive inpatient rehab setting. Physiatrist is providing close team supervision and 24 hour management of active medical problems listed below. Physiatrist and rehab team continue to assess barriers to discharge/monitor patient progress toward functional and medical goals.  FIM: FIM - Bathing Bathing Steps Patient  Completed: Chest;Right Arm;Left Arm;Buttocks;Front perineal area;Abdomen;Right upper leg;Left upper leg;Right lower leg (including foot);Left lower leg (including foot) Bathing: 5: Supervision: Safety issues/verbal cues  FIM - Upper Body Dressing/Undressing Upper body dressing/undressing steps patient completed: Thread/unthread right sleeve of pullover shirt/dresss;Thread/unthread left sleeve of pullover shirt/dress;Put head through opening of pull over shirt/dress;Pull shirt over trunk Upper body dressing/undressing: 5: Set-up assist to: Obtain clothing/put away FIM - Lower Body Dressing/Undressing Lower body dressing/undressing steps patient completed: Thread/unthread right pants  leg;Thread/unthread left pants leg;Pull pants up/down;Fasten/unfasten pants;Don/Doff right shoe;Don/Doff left shoe Lower body dressing/undressing: 4: Steadying Assist  FIM - Toileting Toileting steps completed by patient: Adjust clothing prior to toileting;Performs perineal hygiene;Adjust clothing after toileting Toileting Assistive Devices: Grab bar or rail for support Toileting: 5: Supervision: Safety issues/verbal cues  FIM - Radio producer Devices: Walker;Elevated toilet seat;Grab bars Toilet Transfers: 3-To toilet/BSC: Mod A (lift or lower assist);4-From toilet/BSC: Min A (steadying Pt. > 75%)  FIM - Bed/Chair Transfer Bed/Chair Transfer Assistive Devices: Arm rests;Bed rails Bed/Chair Transfer: 4: Sit > Supine: Min A (steadying pt. > 75%/lift 1 leg);4: Bed > Chair or W/C: Min A (steadying Pt. > 75%)  FIM - Locomotion: Wheelchair Distance: 175 Locomotion: Wheelchair: 0: Activity did not occur FIM - Locomotion: Ambulation Locomotion: Ambulation Assistive Devices: Administrator Ambulation/Gait Assistance: 4: Min assist Locomotion: Ambulation: 4: Travels 150 ft or more with minimal assistance (Pt.>75%)  Comprehension Comprehension Mode: Auditory Comprehension: 5-Understands basic 90% of the time/requires cueing < 10% of the time  Expression Expression Mode: Verbal Expression: 5-Expresses basic needs/ideas: With extra time/assistive device  Social Interaction Social Interaction: 6-Interacts appropriately with others with medication or extra time (anti-anxiety, antidepressant).  Problem Solving Problem Solving: 4-Solves basic 75 - 89% of the time/requires cueing 10 - 24% of the time  Memory Memory: 4-Recognizes or recalls 75 - 89% of the time/requires cueing 10 - 24% of the time   Medical Problem List and Plan:  1. Functional deficits secondary to right PCA infarct/rhabdomyolysis  2. DVT Prophylaxis/Anticoagulation: Subcutaneous Lovenox.  Monitor platelet counts and any signs of bleeding  3. Pain Management: Tylenol as needed, heat/stretching/strengthening for quads/proximal LE's  4. Neuropsych: This patient is capable of making decisions on his own behalf.  5. History of peripheral neuropathy. Continue Cymbalta.  6. Hypertension. Toprol-XL 12.5 mg daily. Monitor with increased mobility  7.  UTI, empiric abx until Cx back 8.  HyperK (5.3)  hemolysis , repeat LOS (Days) 4 A FACE TO FACE EVALUATION WAS PERFORMED  Charlett Blake 04/05/2014, 8:21 AM

## 2014-04-06 ENCOUNTER — Inpatient Hospital Stay (HOSPITAL_COMMUNITY): Payer: Medicare Other | Admitting: Speech Pathology

## 2014-04-06 ENCOUNTER — Inpatient Hospital Stay (HOSPITAL_COMMUNITY): Payer: Medicare Other

## 2014-04-06 DIAGNOSIS — M6282 Rhabdomyolysis: Secondary | ICD-10-CM

## 2014-04-06 DIAGNOSIS — I6350 Cerebral infarction due to unspecified occlusion or stenosis of unspecified cerebral artery: Secondary | ICD-10-CM

## 2014-04-06 NOTE — Progress Notes (Signed)
Occupational Therapy Session Note  Patient Details  Name: Carl Rios MRN: [id number] Date of Birth: 20-Aug-1926  Today's Date: 04/06/2014 Time: 1100-1200 Time Calculation (min): 60 min  Short Term Goals: Week 1:  OT Short Term Goal 1 (Week 1): Pt will be able to stand to adjust clothing before and after toileting with supervision only. OT Short Term Goal 2 (Week 1): Pt will be able to transfer to toilet using RW with supervision only.   OT Short Term Goal 3 (Week 1): Pt will be able to stand in shower with close S demonstrating improved balance without posterior or L lean. OT Short Term Goal 4 (Week 1): Pt will be able to do room exercise program for LUE with min cues.  Skilled Therapeutic Interventions/Progress Updates: Therapeutic activities with focus on dynamic standing balance, falls recovery, general strengthening and motor planning.   Patient received in his room, fully dressed reporting no pain and early am assist with bathing and dressing tasks.   Patient HOH but agreeable to therapeutic activity to promote improved dynamic standing balance, safety awareness, endurance and general strengthening.   Patient completed functional mobility using RW with SBA to bathroom, performed toilet transfer to/from toilet with steadying assist due to mild LOB (posterior) and ambulated to gym, approx 200', with good stride and pace for therex.   Pt. performed 16 min of therex using NuStep, level 3 with no evidence of fatigue at end of session.    Patient then requested urgent need to void urine and ambulate to ADL apartment, standing at toilet to urinate with SBA for safety.   Patient completed hand hygiene and was challenged to perform fall recovery session using mat on floor.   Patient reported back pain during task but with repetition improved from max assist to rise to hands/knees (quadraped) to only min assist after 6 attempts.   Patient recovered to edge of bed with mod assist and ambulated back to his  room with supervision.   Patient setup for self-feeding at recliner with safety belt secured and call light within reach.     Therapy Documentation Precautions:  Precautions Precautions: Fall Precaution Comments: HOH Restrictions Weight Bearing Restrictions: No General:   Vital Signs:   Pain: Pain Assessment Pain Assessment: No/denies pain ADL: ADL ADL Comments: Refer to FIM Exercises: Cardiovascular Exercises NuStep: 15 min, level 3, BORG 12 Other Treatments:    See FIM for current functional status  Therapy/Group: Individual Therapy  Second session: Time: 9892-1194 Time Calculation (min):  18 min  Pain Assessment: Mild discomfort at low back (unable to quantify)  Skilled Therapeutic Interventions: Patient received supine in bed, asleep.   Patient was unaware of additional visits planned despite therapist reminding patient of visit "after lunch."   Patient stated he had no schedule printed and then declined visit, initially, but agreed to education on low back stretches while supine.   Patient HOH and unable to appreciate need for practice of exercises provided but agreed to read instructions and attempt at a later time.   Session terminated due to patient fatigue from fall recovery training.  See FIM for current functional status  Therapy/Group: Individual Therapy  Salome Spotted 04/06/2014, 12:49 PM

## 2014-04-06 NOTE — Progress Notes (Signed)
Physical Therapy Session Note  Patient Details  Name: Carl Rios MRN: [id number] Date of Birth: 06-17-1926  Today's Date: 04/06/2014 Time: 2637-8588 Time Calculation (min): 38 min  Short Term Goals: Week 1:  PT Short Term Goal 1 (Week 1): STG=LTG due to short LOS, Supervision gait x150' and stairs, Mod I transfers  Skilled Therapeutic Interventions/Progress Updates:  Therapeutic Activity: supine <>sit,RW supervision, sit<>Stand transfers compliant surfaces. Supervision and cues for safety. Ambulated >250' x 2; Pt. Had no LOB during ambulation on controled level surfaces: tile/laminant/dense carpet.  Therapeutic Exercise: BLE hip flex, knee ext, ankle pumps, hip add, hip abd: each performed to patient's tolerance and endurance levels. Pt. States he likes to warm up prior to doing activities. Pt. Able to verbalize understanding of exercises and states he will do them independently when watching tv.  Self-Care: Static balance at sink to perform selfcare: washing, brushing hair/teeth. Pt. Min guard assist to supervision for therapeutic activities and self care with RW use. Pt. Able to perform toileting with assistance for steady during donning/doffing underwear (pt state he can do it, but still groggy due to be awakened by therapist). Pt. Donned shorts, shirt, shoes with supervision.  Pt. Has no c/o pain pre/post or during tx.  Pt. Alert and oriented and expressed desire to go home soon.    Therapy Documentation Precautions:  Precautions Precautions: Fall Precaution Comments: HOH Restrictions Weight Bearing Restrictions: No  Vital Signs: HR: 85 O2: 97%  Pain: No c/o pain.  See FIM for current functional status  Therapy/Group: Individual Therapy  Juluis Mire 04/06/2014, 4:46 PM

## 2014-04-06 NOTE — Progress Notes (Signed)
Patient ID: Carl Rios, male   DOB: May 21, 1926, 80 y.o.   MRN: [id number] Carl Rios is a 80 y.o. male 1926/08/21 [id number]  Subjective: No new complaints. No new problems. Slept well. Feeling OK.  Objective: Vital signs in last 24 hours: Temp:  [97.5 F (36.4 C)-98.2 F (36.8 C)] 97.5 F (36.4 C) (06/06 0620) Pulse Rate:  [45-91] 45 (06/06 0620) Resp:  [18-20] 18 (06/06 0620) BP: (115-149)/(69-92) 115/69 mmHg (06/06 0620) SpO2:  [92 %-98 %] 95 % (06/06 0620) Weight change:  Last BM Date: 04/05/14  Intake/Output from previous day: 06/05 0701 - 06/06 0700 In: 960 [P.O.:960] Out: 300 [Urine:300]  Physical Exam General: No apparent distress - very HOH, pleasant   Lungs: Normal effort. Lungs clear to auscultation, no crackles or wheezes. Cardiovascular: Regular rate and rhythm, no edema Neurological: No new neurological deficits - ataxic as per PT Wounds: N/A .  Lab Results: BMET    Component Value Date/Time   NA 139 04/02/2014 0700   K 5.3 04/02/2014 0700   CL 102 04/02/2014 0700   CO2 26 04/02/2014 0700   GLUCOSE 113* 04/02/2014 0700   BUN 18 04/02/2014 0700   CREATININE 0.87 04/02/2014 0700   CREATININE 0.94 03/13/2014 1131   CALCIUM 9.3 04/02/2014 0700   GFRNONAA 76* 04/02/2014 0700   GFRAA 88* 04/02/2014 0700   CBC    Component Value Date/Time   WBC 9.0 04/02/2014 0700   RBC 4.73 04/02/2014 0700   HGB 14.1 04/02/2014 0700   HCT 42.7 04/02/2014 0700   PLT 294 04/02/2014 0700   MCV 90.3 04/02/2014 0700   MCH 29.8 04/02/2014 0700   MCHC 33.0 04/02/2014 0700   RDW 15.0 04/02/2014 0700   LYMPHSABS 1.7 04/02/2014 0700   MONOABS 0.5 04/02/2014 0700   EOSABS 0.2 04/02/2014 0700   BASOSABS 0.0 04/02/2014 0700   CBG's (last 3):  No results found for this basename: GLUCAP,  in the last 72 hours LFT's Lab Results  Component Value Date   ALT 26 04/02/2014   AST 32 04/02/2014   ALKPHOS 87 04/02/2014   BILITOT 0.7 04/02/2014    Studies/Results: No results found.  Medications:  I have reviewed the  patient's current medications. Scheduled Medications: . aspirin EC  81 mg Oral Daily  . cephALEXin  250 mg Oral 3 times per day  . DULoxetine  30 mg Oral Daily  . enoxaparin (LOVENOX) injection  40 mg Subcutaneous Q24H  . metoprolol succinate  12.5 mg Oral Daily   PRN Medications: acetaminophen, ondansetron (ZOFRAN) IV, ondansetron, polyethylene glycol, sorbitol  Assessment/Plan: Active Problems:   CVA (cerebral infarction) . Functional deficits secondary to right PCA infarct 03/25/14, manifest with fall and rhabdomyolysis - gait instability - continue rehab efforts as ongoing 2. DVT Prophylaxis/Anticoagulation: Subcutaneous Lovenox. Monitor platelet counts and any signs of bleeding  3. Pain Management: Tylenol as needed, heat/stretching/strengthening for quads/proximal LE's  4. Neuropsych: This patient is capable of making decisions on his own behalf.  5. History of peripheral neuropathy. Continue Cymbalta.  6. Hypertension. Toprol-XL 12.5 mg daily. Monitor with increased mobility  7. E coli UTI, Keflex started 04/03/14 - Afeb   Length of stay, days: 5    Valerie A. Asa Lente, MD 04/06/2014, 7:22 AM

## 2014-04-06 NOTE — Progress Notes (Signed)
Speech Language Pathology Daily Session Note  Patient Details  Name: Carl Rios MRN: [id number] Date of Birth: [birth year]  Today's Date: 04/06/2014 Time: 6811-5726 Time Calculation (min): 18 min  Short Term Goals: Week 1: SLP Short Term Goal 1 (Week 1): Pt will improve working memory during functional tasks for 80% accuracy and min assist as needed.  SLP Short Term Goal 2 (Week 1): Pt will identify at least 2 physical deficits and 1 cognitive deficit and their impact on his functional independence at home with min assist.  SLP Short Term Goal 3 (Week 1): Pt will improve executive function for planning, thought organization, mental flexibility and error awareness during functional tasks for 80% accuracy and min assist as needed.    Skilled Therapeutic Interventions: Therapeutic intervention complete with short term goals addressed. The patient required min A verbal assistance to recognize and identify deficits that may impact baseline independence.  He was 75% accurate with functional tasks as well as min A verbal cues for problem solving.  Continue with current treatment plan.      FIM:  Comprehension Comprehension Mode: Auditory Expression Expression Mode: Verbal Expression: 5-Expresses basic needs/ideas: With extra time/assistive device Social Interaction Social Interaction: 7-Interacts appropriately with others - No medications needed. Memory Memory: 5-Recognizes or recalls 90% of the time/requires cueing < 10% of the time  Pain Pain Assessment Pain Assessment: No/denies pain  Therapy/Group: Individual Therapy  Tiaja Hagan L Taha Dimond 04/06/2014, 1:24 PM

## 2014-04-07 NOTE — Progress Notes (Signed)
Patient ID: Carl Rios, male   DOB: 1925/11/09, 80 y.o.   MRN: [id number] Carl Rios is a 80 y.o. male 10-15-1926 [id number]  Subjective: At sink, washing face - using walker, RN assist for supervision. No new complaints. No new problems. Slept well. Feeling OK.  Objective: Vital signs in last 24 hours: Temp:  [97.3 F (36.3 C)-97.8 F (36.6 C)] 97.6 F (36.4 C) (06/07 0608) Pulse Rate:  [48-77] 68 (06/07 0608) Resp:  [18-20] 18 (06/07 0608) BP: (103-124)/(62-71) 103/66 mmHg (06/07 0608) SpO2:  [96 %] 96 % (06/07 5009) Weight change:  Last BM Date: 04/05/14  Intake/Output from previous day: 06/06 0701 - 06/07 0700 In: 840 [P.O.:840] Out: 100 [Urine:100]  Physical Exam General: No apparent distress - very HOH, pleasant   Lungs: Normal effort. Lungs clear to auscultation, no crackles or wheezes. Cardiovascular: Regular rate and rhythm, no edema Neurological: No new neurological deficits - mildly ataxic  Wounds: N/A .  Lab Results: BMET    Component Value Date/Time   NA 139 04/02/2014 0700   K 5.3 04/02/2014 0700   CL 102 04/02/2014 0700   CO2 26 04/02/2014 0700   GLUCOSE 113* 04/02/2014 0700   BUN 18 04/02/2014 0700   CREATININE 0.87 04/02/2014 0700   CREATININE 0.94 03/13/2014 1131   CALCIUM 9.3 04/02/2014 0700   GFRNONAA 76* 04/02/2014 0700   GFRAA 88* 04/02/2014 0700   CBC    Component Value Date/Time   WBC 9.0 04/02/2014 0700   RBC 4.73 04/02/2014 0700   HGB 14.1 04/02/2014 0700   HCT 42.7 04/02/2014 0700   PLT 294 04/02/2014 0700   MCV 90.3 04/02/2014 0700   MCH 29.8 04/02/2014 0700   MCHC 33.0 04/02/2014 0700   RDW 15.0 04/02/2014 0700   LYMPHSABS 1.7 04/02/2014 0700   MONOABS 0.5 04/02/2014 0700   EOSABS 0.2 04/02/2014 0700   BASOSABS 0.0 04/02/2014 0700   CBG's (last 3):  No results found for this basename: GLUCAP,  in the last 72 hours LFT's Lab Results  Component Value Date   ALT 26 04/02/2014   AST 32 04/02/2014   ALKPHOS 87 04/02/2014   BILITOT 0.7 04/02/2014    Studies/Results: No  results found.  Medications:  I have reviewed the patient's current medications. Scheduled Medications: . aspirin EC  81 mg Oral Daily  . cephALEXin  250 mg Oral 3 times per day  . DULoxetine  30 mg Oral Daily  . enoxaparin (LOVENOX) injection  40 mg Subcutaneous Q24H  . metoprolol succinate  12.5 mg Oral Daily   PRN Medications: acetaminophen, ondansetron (ZOFRAN) IV, ondansetron, polyethylene glycol, sorbitol  Assessment/Plan: Active Problems:   CVA (cerebral infarction) . Functional deficits secondary to right PCA infarct 03/25/14, manifest with fall and rhabdomyolysis - gait instability - continue rehab efforts as ongoing 2. DVT Prophylaxis/Anticoagulation: Subcutaneous Lovenox. Monitor platelet counts and any signs of bleeding  3. Pain Management: Tylenol as needed, heat/stretching/strengthening for quads/proximal LE's  4. Neuropsych: This patient is capable of making decisions on his own behalf.  5. History of peripheral neuropathy. Continue Cymbalta.  6. Hypertension. Toprol-XL 12.5 mg daily. Monitor with increased mobility  7. E coli UTI, Keflex started 04/03/14 - Afeb   Length of stay, days: 6    Enrica Corliss A. Asa Lente, MD 04/07/2014, 8:49 AM

## 2014-04-08 ENCOUNTER — Inpatient Hospital Stay (HOSPITAL_COMMUNITY): Payer: Medicare Other | Admitting: Speech Pathology

## 2014-04-08 ENCOUNTER — Inpatient Hospital Stay (HOSPITAL_COMMUNITY): Payer: Medicare Other | Admitting: Occupational Therapy

## 2014-04-08 ENCOUNTER — Inpatient Hospital Stay (HOSPITAL_COMMUNITY): Payer: Medicare Other

## 2014-04-08 LAB — BASIC METABOLIC PANEL
BUN: 15 mg/dL (ref 6–23)
CHLORIDE: 101 meq/L (ref 96–112)
CO2: 25 meq/L (ref 19–32)
Calcium: 9.2 mg/dL (ref 8.4–10.5)
Creatinine, Ser: 0.88 mg/dL (ref 0.50–1.35)
GFR calc Af Amer: 88 mL/min — ABNORMAL LOW (ref >90)
GFR calc non Af Amer: 76 mL/min — ABNORMAL LOW (ref >90)
Glucose, Bld: 104 mg/dL — ABNORMAL HIGH (ref 70–99)
POTASSIUM: 4.4 meq/L (ref 3.7–5.3)
SODIUM: 138 meq/L (ref 137–147)

## 2014-04-08 LAB — CREATININE, SERUM
Creatinine, Ser: 0.91 mg/dL (ref 0.50–1.35)
GFR calc Af Amer: 86 mL/min — ABNORMAL LOW (ref >90)
GFR calc non Af Amer: 75 mL/min — ABNORMAL LOW (ref >90)

## 2014-04-08 NOTE — Progress Notes (Addendum)
Physical Therapy Session Note  Patient Details  Name: Carl Rios MRN: [id number] Date of Birth: November 29, 1925  Today's Date: 04/08/2014 Time:0900-1000 2878-6767 Time Calculation (min): 60 and 45 min  Short Term Goals: Week 1:  PT Short Term Goal 1 (Week 1): STG=LTG due to short LOS, Supervision gait x150' and stairs, Mod I transfers     Skilled Therapeutic Interventions/Progress Updates:  tx 1:  NT stated that pt was incontinent of large amount of urine in bed, despite timed toileting 20 min earlier.  Pt was unaware.  Gait in room and to/from therapy with RW, close supervision.  Pt over-shifts to L then compensates by adducting RLE.  R heel bumps against L often during gait causing frequent mild LOB which pt recovers independently with RW.  neuromuscular re-education via forced use, demo, VCs for self stretching bil hamstrings and heel cords in sitting; terminal hip extension during repetitive sit>< stand without use of UEs, bil PNF diagonals with 5# weighted bar in sitting x 20 R and L; assisted bil toes up/down x 4 x 2, scooting laterally in supine for dissociation of upper and lower trunk, supine> sit without pulling up on furniture with UEs.  Pt has poor recruitment of L hip, trunk muscles during mobility. Lower leg muscles weak bil, contributing to poor balance strategies.  He has no day to day recall of self stretching hamstrings and heel cords; purpose or method.  Tx 2:  Son Rod present.  Discussed pt's problems with retention of day-to-day information, tendency to move too fast, and hearing deficits which are all safety issues.  Son is looking at short-term SNF.  Bed mobility in flat bed with extra time and with difficulty, supervision.  Gait in room unsafe due to pt's impulsivity, distractions of attempting to talk to son while ambulating.  Min assist for drifting L with poor awareness. Gait on level tile x 150' with supervision, RW on straight path, min assist for turns. Up/down  5 steps 2 rails, alternating feet ascending and descending.  Although cued extensively   neuromuscular re-education as above for self stretching with max assist for set-up, using hand out.  Pt recognized the picture on hand out, but not how to execute the movement.  Gait without AD working on wt shifting.  Pt lists L and then nearly scissors with RLE to compensate.  Given visual target, pt improves with wt shifting.  Pt reported that he had to use toilet; min guard assist for gait in room and toilet transfer using 2 bars.  Pt left sitting up in recliner, all needs in place and quick release belt in place.     Therapy Documentation Precautions:  Precautions Precautions: Fall Precaution Comments: HOH Restrictions Weight Bearing Restrictions: No       See FIM for current functional status  Therapy/Group: Individual Therapy  Frederic Jericho 04/08/2014, 4:27 PM

## 2014-04-08 NOTE — Progress Notes (Signed)
Social Work Patient ID: Carl Rios, male   DOB: 08/24/1926, 80 y.o.   MRN: [id number] Met with son-Rod to discuss plans he has spoken with [facility] and they will come and evaluate pt for there services.  Son going back and forth regaridng pt Going to NH for short term and to get more rehab. Have given him the list of NH's and he can tour.  He is thinking that this is the better option for pt to get more rehab and then See how much assistance he needs after this.  Caroline-PT present and feels he would benefit, since requiring more assistance today in therapies.  Will try to narrow down a  Plan and work toward this plan.  Will discuss with pt which he prefers. 

## 2014-04-08 NOTE — Progress Notes (Signed)
Occupational Therapy Session Note  Patient Details  Name: RISHARD DELANGE MRN: [id number] Date of Birth: 1926-05-26  Today's Date: 04/08/2014 Time: 0160-1093 Time Calculation (min): 45 min  Short Term Goals: Week 1:  OT Short Term Goal 1 (Week 1): Pt will be able to stand to adjust clothing before and after toileting with supervision only. OT Short Term Goal 2 (Week 1): Pt will be able to transfer to toilet using RW with supervision only.   OT Short Term Goal 3 (Week 1): Pt will be able to stand in shower with close S demonstrating improved balance without posterior or L lean. OT Short Term Goal 4 (Week 1): Pt will be able to do room exercise program for LUE with min cues.      Skilled Therapeutic Interventions/Progress Updates:      Pt seen for BADL retraining of toileting, bathing, and dressing with a focus on dynamic balance, safety awareness, and problem solving. Pt was overall supervision with all self care, but does need repeated cues on how to transfer safely, how to approach dressing safely. These are the same cues he has been receiving since admission, he is having great difficulty recalling them.  Such as not standing to remove or don clothing over feet, scooting all the way on the tub bench to the wall so he is not sitting at the edge, not pulling to stand up.  His balance was stable today, and no LOB.  Pt asked again if he could drive. Explained why he couldn't drive and why the team is recommending 24/7 S. Pt stated he understood. Pt resting in recliner with call light in reach and QRB on.  Therapy Documentation Precautions:  Precautions Precautions: Fall Precaution Comments: HOH Restrictions Weight Bearing Restrictions: No    Vital Signs: Therapy Vitals BP: 142/90 mmHg Pain: Pain Assessment Pain Assessment: No/denies pain ADL: ADL ADL Comments: Refer to FIM  See FIM for current functional status  Therapy/Group: Individual Therapy  Geoffery Lyons Saguier 04/08/2014, 12:02  PM

## 2014-04-08 NOTE — Progress Notes (Signed)
Speech Language Pathology Weekly Progress and Session Note  Patient Details  Name: Carl Rios MRN: [id number] Date of Birth: 1926-10-22  Beginning of progress report period: Mar 01, 2014 End of progress report period: Mar 08, 2014  Today's Date: 04/08/2014 Time: - 9417-4081 Time Calculation (min): 55 min  Short Term Goals: Week 1: SLP Short Term Goal 1 (Week 1): Pt will improve working memory during functional tasks for 80% accuracy and min assist as needed.  SLP Short Term Goal 1 - Progress (Week 1): Progressing toward goal SLP Short Term Goal 2 (Week 1): Pt will identify at least 2 physical deficits and 1 cognitive deficit and their impact on his functional independence at home with min assist.  SLP Short Term Goal 2 - Progress (Week 1): Progressing toward goal SLP Short Term Goal 3 (Week 1): Pt will improve executive function for planning, thought organization, mental flexibility and error awareness during functional tasks for 80% accuracy and min assist as needed.   SLP Short Term Goal 3 - Progress (Week 1): Progressing toward goal    New Short Term Goals: Week 2: SLP Short Term Goal 1 (Week 2): Pt will improve working memory during functional tasks for 80% accuracy and min assist as needed.  SLP Short Term Goal 2 (Week 2): Pt will identify at least 2 physical deficits and 1 cognitive deficit and their impact on his functional independence at home with min assist.  SLP Short Term Goal 3 (Week 2): Pt will improve executive function for planning, thought organization, mental flexibility and error awareness during functional tasks for 80% accuracy and min assist as needed.    Weekly Progress Updates: Patient has made modest gains and is progressing towards goals although he did not meet any short term goals this reporting period.  Pt exhibits improved awareness regarding his current level of function although he continues to require min-mod cuing to identify ways in which his current  deficits will impact his functional independence in his home environment.  Currently, patient continues to require min-mod assist for executive function, working memory, emergent awareness, and safety judgement/insight.    Patient and family education is ongoing. Patient would benefit from continued skilled SLP intervention to improve cognitive function in order to maximize his functional independence prior to discharge.    Intensity: Minumum of 1-2 x/day, 30 to 90 minutes Frequency: 5 out of 7 days Duration/Length of Stay: 10-14 days Treatment/Interventions: Cognitive remediation/compensation;Cueing hierarchy;Patient/family education;Therapeutic Activities;Internal/external aids;Environmental controls;Functional tasks   Daily Session  Skilled Therapeutic Interventions: Pt was seen for skilled speech therapy targeting safety awareness/judgement, executive function, and memory.  Pt propelled the wheelchair from his room to the ADL kitchen with modified independence for initiation and sequencing.  Once in the kitchen, pt was overall min assist for safety awareness/judgment while standing to put items from the sink into the dishwasher.  Pt continues to be impulsive when transitioning from a sitting to standing position and requires mod cuing to slow down.  During a structured new learning task, pt was overall mod-max assist for planning and organization, although SLP suspects this is largely related to pt's difficulty hearing and subsequent processing delays.  Pt was able to recall the rules of the game following multiple repetitions and mod assist verbal cues.  Furthermore, pt required max assist to recall recommended stretching exercises from PT, although he was able to identify the rationale behind exercises with min cuing.  Continue per current plan of care.  FIM:  Comprehension Comprehension Mode: Auditory Comprehension: 5-Understands basic 90% of the time/requires cueing < 10% of the  time Expression Expression Mode: Verbal Expression: 5-Expresses basic 90% of the time/requires cueing < 10% of the time. Social Interaction Social Interaction: 4-Interacts appropriately 75 - 89% of the time - Needs redirection for appropriate language or to initiate interaction. Problem Solving Problem Solving: 4-Solves basic 75 - 89% of the time/requires cueing 10 - 24% of the time Memory Memory: 4-Recognizes or recalls 75 - 89% of the time/requires cueing 10 - 24% of the time FIM - Eating Eating Activity: 5: Set-up assist for open containers General  Amount of Missed SLP Time (min): 5 Minutes Pain Pain Assessment Pain Assessment: No/denies pain  Therapy/Group: Individual Therapy  [name], M.A. CCC-SLP  [name] 04/08/2014, 4:28 PM

## 2014-04-08 NOTE — Progress Notes (Signed)
Subjective/Complaints: 80 y.o. right-handed male history of peripheral neuropathy. Presented 03/26/2014 after a fall and remained down for approximately 12 hours until found by family. Patient independent prior to admission living alone and driving. Noted total CK of 1550, troponin negative, white blood cell count 14,100 and BUN 27. Cranial CT scan with no evidence of intracranial abnormality noted chronic lacunar infarct at the right thalamus. Patient did not receive TPA. The thoracic and lumbar spines negative for acute changes. Pelvis films without fracture. MRI of the brain revealed an acute right PCA infarct. Carotid Dopplers with no ICA stenosis. Echocardiogram with ejection fraction of 42% grade 1 diastolic dysfunction  No bladder c/os  Review of Systems - neg except weakness Objective: Vital Signs: Blood pressure 137/68, pulse 63, temperature 98.9 F (37.2 C), temperature source Oral, resp. rate 18, height 5' 8" (1.727 m), weight 83.008 kg (183 lb), SpO2 96.00%. No results found. Results for orders placed during the hospital encounter of 04/01/14 (from the past 72 hour(s))  CREATININE, SERUM     Status: Abnormal   Collection Time    04/08/14  3:47 AM      Result Value Ref Range   Creatinine, Ser 0.91  0.50 - 1.35 mg/dL   GFR calc non Af Amer 75 (*) >90 mL/min   GFR calc Af Amer 86 (*) >90 mL/min   Comment: (NOTE)     The eGFR has been calculated using the CKD EPI equation.     This calculation has not been validated in all clinical situations.     eGFR's persistently <90 mL/min signify possible Chronic Kidney     Disease.     HEENT: normal Cardio: RRR and no murmurs Resp: CTA B/L and unlabored GI: BS positive and NT Extremity:  Pulses positive and No Edema Skin:   Intact Neuro: Alert/Oriented, Cranial Nerve II-XII normal, Normal Sensory, Normal Motor and Abnormal FMC Ataxic/ dec FMC Left hemiataxia Musc/Skel:  Normal Gen NAD   Assessment/Plan: 1. Functional deficits  secondary to R PCA infarct which require 3+ hours per day of interdisciplinary therapy in a comprehensive inpatient rehab setting. Physiatrist is providing close team supervision and 24 hour management of active medical problems listed below. Physiatrist and rehab team continue to assess barriers to discharge/monitor patient progress toward functional and medical goals.  FIM: FIM - Bathing Bathing Steps Patient Completed: Chest;Right Arm;Left Arm;Buttocks;Front perineal area;Abdomen;Right upper leg;Left upper leg;Right lower leg (including foot);Left lower leg (including foot) Bathing: 5: Supervision: Safety issues/verbal cues  FIM - Upper Body Dressing/Undressing Upper body dressing/undressing steps patient completed: Thread/unthread right sleeve of pullover shirt/dresss;Thread/unthread left sleeve of pullover shirt/dress;Put head through opening of pull over shirt/dress;Pull shirt over trunk Upper body dressing/undressing: 5: Set-up assist to: Obtain clothing/put away FIM - Lower Body Dressing/Undressing Lower body dressing/undressing steps patient completed: Thread/unthread right pants leg;Thread/unthread left pants leg;Pull pants up/down;Fasten/unfasten pants;Don/Doff right shoe;Don/Doff left shoe Lower body dressing/undressing: 5: Supervision: Safety issues/verbal cues  FIM - Toileting Toileting steps completed by patient: Adjust clothing prior to toileting;Performs perineal hygiene;Adjust clothing after toileting Toileting Assistive Devices: Grab bar or rail for support Toileting: 5: Supervision: Safety issues/verbal cues  FIM - Radio producer Devices: Mining engineer Transfers: 4-From toilet/BSC: Min A (steadying Pt. > 75%);5-To toilet/BSC: Supervision (verbal cues/safety issues)  FIM - Engineer, site Assistive Devices: Arm rests;Walker Bed/Chair Transfer: 4: Bed > Chair or W/C: Min A (steadying Pt. > 75%);5: Chair or W/C >  Bed: Supervision (verbal cues/safety issues)  FIM -  Locomotion: Wheelchair Distance: 175 Locomotion: Wheelchair: 0: Activity did not occur FIM - Locomotion: Ambulation Locomotion: Ambulation Assistive Devices: Walker - Rolling Ambulation/Gait Assistance: 5: Supervision Locomotion: Ambulation: 5: Travels 150 ft or more with supervision/safety issues  Comprehension Comprehension Mode: Auditory Comprehension: 5-Understands complex 90% of the time/Cues < 10% of the time  Expression Expression Mode: Nonverbal Expression: 6-Expresses complex ideas: With extra time/assistive device  Social Interaction Social Interaction: 7-Interacts appropriately with others - No medications needed.  Problem Solving Problem Solving: 4-Solves basic 75 - 89% of the time/requires cueing 10 - 24% of the time  Memory Memory: 5-Requires cues to use assistive device   Medical Problem List and Plan:  1. Functional deficits secondary to right PCA infarct/rhabdomyolysis  2. DVT Prophylaxis/Anticoagulation: Subcutaneous Lovenox. Monitor platelet counts and any signs of bleeding  3. Pain Management: Tylenol as needed, heat/stretching/strengthening for quads/proximal LE's  4. Neuropsych: This patient is capable of making decisions on his own behalf.  5. History of peripheral neuropathy. Continue Cymbalta.  6. Hypertension. Toprol-XL 12.5 mg daily. Monitor with increased mobility  7.  UTI, empiric abx until Cx back 8.  HyperK repeat BMET today LOS (Days) 7 A FACE TO FACE EVALUATION WAS PERFORMED   E  04/08/2014, 7:04 AM    

## 2014-04-09 ENCOUNTER — Inpatient Hospital Stay (HOSPITAL_COMMUNITY): Payer: Medicare Other | Admitting: *Deleted

## 2014-04-09 ENCOUNTER — Inpatient Hospital Stay (HOSPITAL_COMMUNITY): Payer: Medicare Other | Admitting: Occupational Therapy

## 2014-04-09 ENCOUNTER — Inpatient Hospital Stay (HOSPITAL_COMMUNITY): Payer: Medicare Other | Admitting: Speech Pathology

## 2014-04-09 DIAGNOSIS — I10 Essential (primary) hypertension: Secondary | ICD-10-CM

## 2014-04-09 DIAGNOSIS — I6330 Cerebral infarction due to thrombosis of unspecified cerebral artery: Secondary | ICD-10-CM

## 2014-04-09 DIAGNOSIS — R269 Unspecified abnormalities of gait and mobility: Secondary | ICD-10-CM

## 2014-04-09 DIAGNOSIS — M6282 Rhabdomyolysis: Secondary | ICD-10-CM

## 2014-04-09 NOTE — Progress Notes (Signed)
Physical Therapy Weekly Progress Note  Patient Details  Name: Carl Rios MRN: [id number] Date of Birth: 1926/07/10  Beginning of progress report period: April 02, 2014 End of progress report period: April 09, 2014  Today's Date: 04/09/2014 Time: 1001-1100 and 15:00-15:30 (85mn)  Time Calculation (min): 59 min  Patient has met 2 of 10 long term goals.  Short term goals not set due to estimated length of stay.  Pt is making good progress with strength and balance, but continues to be limited by unbalanced activation L/R as well as delayed righting reactions. Safety also limited by hearing impairments.   Patient continues to demonstrate the following deficits: motor impersistence, muscle and joint tightness, imapired memory and therefore will continue to benefit from skilled PT intervention to enhance overall performance with activity tolerance, balance and knowledge of precautions.  See Patient's Care Plan for progression toward long term goals.  Patient progressing toward long term goals..  Plan of care revisions: WC goals discontinued due to no longer focus of tx. May downgrade transfer goal pernding progress this week to S level for increased safety. .  Skilled Therapeutic Interventions/Progress Updates:     First tx focused on therex for functional strength and flexibility, balance training, gait, and functional mobility in controlled setting and bathroom. Pt does not recall stretching exercise from yesterday, but was able to follow instructions with verbal and tactile cues. Pt continues to ask same questions "Now, what's this for?" that he has asked in past days. Pt educated on fall risk reduction and goal of balance training.   Pt engaged in Nustep x825m with bil UE/LE level 4 at Borg level "Somewhat hard" for increased balanced muscle activation.   Instructed pt in [place] with 3# weights 2x10 each and handout with verbal cues and demo for technique. Pt needed 3 seated rests due to  fatigue.   NMR via forced use and cues for standing balance with UE diagonal reaching tasks. Pt needed min A x1 due to posterior LOB with delayed righting reactions.   Gait on unit with RW 2x20' and 1x150' with close S and cues to slow down for safety, especilly with turns.  Toilet transfers with S/min A, pt with continent void.  Pt left up with lap belt and call bell.  --------------------------------------------- Second tx focused on balance training, gait, stairs, and functional mobility. Pt given 3 grocery items to recall at end of tx, which he was able to do with 1 guiding cue for third item. Pt unable to recall HS stretch with handout, needing demo and instructions again.   Pt resting in bed, performed bed mobility with S and increased effort. Gait in room and bathroom, continent void, with close S.  Gait on unit with S and cues for straight path with RW.  Pt ascended/descended 5 stairs with 2 rails and close S with reciprocal pattern and no LOB.   Engaged pt in [place] forced use and external perturbations for increasing balance strategies.  Pt performed reaching placing task, requiring pt to rotate trunk and reach low behind, and place high and laterally. Pt had several LOB and less rotation available to L side.  Challenged pt with standing on foam during horseshoe toss. Pt displayed ankle strategy, but limited hip strategy, unable to self-correct posterior lean with perturbations.   Pt wanting to rest in bed with alarm on.   Therapy Documentation Precautions:  Precautions Precautions: Fall Precaution Comments: HOH Restrictions Weight Bearing Restrictions: No Vital Signs: Therapy Vitals  Pulse Rate: 82 BP: 152/74 mmHg Pain: Pain Assessment Pain Assessment: No/denies pain Pain Score: 0-No pain    Locomotion : Ambulation Ambulation/Gait Assistance: 5: Supervision   See FIM for current functional status  Therapy/Group: Individual Therapy Kennieth Rad, PT,  DPT  04/09/2014, 12:30 PM

## 2014-04-09 NOTE — Progress Notes (Signed)
Speech Language Pathology Daily Session Note  Patient Details  Name: Carl Rios MRN: [id number] Date of Birth: 10-24-1926  Today's Date: 04/09/2014 Time: 2297-9892 Time Calculation (min): 45 min  Short Term Goals: Week 2: SLP Short Term Goal 1 (Week 2): Pt will improve working memory during functional tasks for 80% accuracy and min assist as needed.  SLP Short Term Goal 2 (Week 2): Pt will identify at least 2 physical deficits and 1 cognitive deficit and their impact on his functional independence at home with min assist.  SLP Short Term Goal 3 (Week 2): Pt will improve executive function for planning, thought organization, mental flexibility and error awareness during functional tasks for 80% accuracy and min assist as needed.    Skilled Therapeutic Interventions:  Pt was seen for skilled speech therapy targeting memory and executive function.  Pt recalled at least 3 functional details from yesterday's new learning activity with min assist verbal cues.  When engaged in a structured problem solving task, pt required mod faded to min assist for thought organization, working memory, mental flexibility and error awareness.   Pt continues to present with safety issues during functional transfers and benefits from mod-max cuing to apply the brakes before attempting to stand from the wheelchair.  Continue per current plan of care.    FIM:  Comprehension Comprehension Mode: Auditory Comprehension: 5-Understands basic 90% of the time/requires cueing < 10% of the time Expression Expression Mode: Verbal Expression: 5-Expresses basic needs/ideas: With extra time/assistive device Social Interaction Social Interaction: 5-Interacts appropriately 90% of the time - Needs monitoring or encouragement for participation or interaction. Problem Solving Problem Solving: 4-Solves basic 75 - 89% of the time/requires cueing 10 - 24% of the time Memory Memory: 3-Recognizes or recalls 50 - 74% of the  time/requires cueing 25 - 49% of the time  Pain Pain Assessment Pain Assessment: No/denies pain  Therapy/Group: Individual Therapy  Carl Rios, M.A. CCC-SLP   Carl Rios 04/09/2014, 2:50 PM

## 2014-04-09 NOTE — Progress Notes (Signed)
Nutrition Brief Note  Patient identified on the Malnutrition Screening Tool (MST) Report. Pt confirms recent weight loss. He notes that he is eating less here than he does at home, however he is eating 100% of meals. Declined oral nutrition supplements and verbalized understanding to continue to eat well to promote healing.  Wt Readings from Last 15 Encounters:  04/03/14 183 lb (83.008 kg)  03/30/14 205 lb (92.987 kg)  12/11/13 209 lb (94.802 kg)  09/25/13 207 lb 14.4 oz (94.303 kg)  08/23/13 208 lb 5.4 oz (94.5 kg)  08/23/13 208 lb 5.4 oz (94.5 kg)  08/21/13 206 lb 12.8 oz (93.804 kg)  08/17/13 207 lb (93.895 kg)  08/14/13 207 lb (93.895 kg)  06/12/13 204 lb (92.534 kg)  01/08/13 200 lb (90.719 kg)  02/01/12 197 lb 12.8 oz (89.721 kg)  01/24/12 201 lb (91.173 kg)  01/24/12 201 lb (91.173 kg)  01/18/12 197 lb 8.5 oz (89.6 kg)    Body mass index is 27.83 kg/(m^2). Patient meets criteria for Overweight based on current BMI.   Current diet order is Regular, patient is consuming approximately 100% of meals at this time. Labs and medications reviewed.   No nutrition interventions warranted at this time. If nutrition issues arise, please consult RD.   Inda Coke MS, RD, LDN Inpatient Registered Dietitian Pager: [phone] After-hours pager: [phone]

## 2014-04-09 NOTE — Progress Notes (Signed)
Subjective/Complaints: 80 y.o. right-handed male history of peripheral neuropathy. Presented 03/26/2014 after a fall and remained down for approximately 12 hours until found by family. Patient independent prior to admission living alone and driving. Noted total CK of 1550, troponin negative, white blood cell count 14,100 and BUN 27. Cranial CT scan with no evidence of intracranial abnormality noted chronic lacunar infarct at the right thalamus. Patient did not receive TPA. The thoracic and lumbar spines negative for acute changes. Pelvis films without fracture. MRI of the brain revealed an acute right PCA infarct. Carotid Dopplers with no ICA stenosis. Echocardiogram with ejection fraction of 08% grade 1 diastolic dysfunction  Still requiring Min/Sup assist Delayed processing and sequencing  Review of Systems - neg except weakness Objective: Vital Signs: Blood pressure 131/83, pulse 70, temperature 98 F (36.7 C), temperature source Oral, resp. rate 20, height 5' 8"  (1.727 m), weight 83.008 kg (183 lb), SpO2 93.00%. No results found. Results for orders placed during the hospital encounter of 04/01/14 (from the past 72 hour(s))  CREATININE, SERUM     Status: Abnormal   Collection Time    04/08/14  3:47 AM      Result Value Ref Range   Creatinine, Ser 0.91  0.50 - 1.35 mg/dL   GFR calc non Af Amer 75 (*) >90 mL/min   GFR calc Af Amer 86 (*) >90 mL/min   Comment: (NOTE)     The eGFR has been calculated using the CKD EPI equation.     This calculation has not been validated in all clinical situations.     eGFR's persistently <90 mL/min signify possible Chronic Kidney     Disease.  BASIC METABOLIC PANEL     Status: Abnormal   Collection Time    04/08/14  7:30 AM      Result Value Ref Range   Sodium 138  137 - 147 mEq/L   Potassium 4.4  3.7 - 5.3 mEq/L   Chloride 101  96 - 112 mEq/L   CO2 25  19 - 32 mEq/L   Glucose, Bld 104 (*) 70 - 99 mg/dL   BUN 15  6 - 23 mg/dL   Creatinine, Ser 0.88   0.50 - 1.35 mg/dL   Calcium 9.2  8.4 - 10.5 mg/dL   GFR calc non Af Amer 76 (*) >90 mL/min   GFR calc Af Amer 88 (*) >90 mL/min   Comment: (NOTE)     The eGFR has been calculated using the CKD EPI equation.     This calculation has not been validated in all clinical situations.     eGFR's persistently <90 mL/min signify possible Chronic Kidney     Disease.     HEENT: normal Cardio: RRR and no murmurs Resp: CTA B/L and unlabored GI: BS positive and NT Extremity:  Pulses positive and No Edema Skin:   Intact Neuro: Alert/Oriented, Cranial Nerve II-XII normal, Normal Sensory LT RUE, and LUE, Reduce in LLE, tactile inattention LUE, Normal Motor and Abnormal FMC Ataxic/ dec FMC Left hemiataxia Musc/Skel:  Normal Gen NAD   Assessment/Plan: 1. Functional deficits secondary to R PCA infarct which require 3+ hours per day of interdisciplinary therapy in a comprehensive inpatient rehab setting. Physiatrist is providing close team supervision and 24 hour management of active medical problems listed below. Physiatrist and rehab team continue to assess barriers to discharge/monitor patient progress toward functional and medical goals.  FIM: FIM - Bathing Bathing Steps Patient Completed: Chest;Right Arm;Left Arm;Buttocks;Front perineal area;Abdomen;Right upper leg;Left  upper leg;Right lower leg (including foot);Left lower leg (including foot) Bathing: 5: Supervision: Safety issues/verbal cues  FIM - Upper Body Dressing/Undressing Upper body dressing/undressing steps patient completed: Thread/unthread right sleeve of pullover shirt/dresss;Thread/unthread left sleeve of pullover shirt/dress;Put head through opening of pull over shirt/dress;Pull shirt over trunk Upper body dressing/undressing: 6: More than reasonable amount of time FIM - Lower Body Dressing/Undressing Lower body dressing/undressing steps patient completed: Thread/unthread right pants leg;Thread/unthread left pants leg;Pull pants  up/down;Fasten/unfasten pants;Don/Doff right shoe;Don/Doff left shoe;Thread/unthread right underwear leg;Thread/unthread left underwear leg;Pull underwear up/down Lower body dressing/undressing: 5: Supervision: Safety issues/verbal cues  FIM - Toileting Toileting steps completed by patient: Adjust clothing prior to toileting;Performs perineal hygiene;Adjust clothing after toileting Toileting Assistive Devices: Grab bar or rail for support Toileting: 5: Supervision: Safety issues/verbal cues  FIM - Radio producer Devices: Mining engineer Transfers: 5-To toilet/BSC: Supervision (verbal cues/safety issues);5-From toilet/BSC: Supervision (verbal cues/safety issues)  FIM - Engineer, site Assistive Devices: Arm rests;Walker Bed/Chair Transfer: 5: Supine > Sit: Supervision (verbal cues/safety issues);5: Bed > Chair or W/C: Supervision (verbal cues/safety issues)  FIM - Locomotion: Wheelchair Distance: 150 Locomotion: Wheelchair: 0: Activity did not occur FIM - Locomotion: Ambulation Locomotion: Ambulation Assistive Devices: Administrator Ambulation/Gait Assistance: 5: Supervision;4: Min assist Locomotion: Ambulation: 4: Travels 150 ft or more with minimal assistance (Pt.>75%)  Comprehension Comprehension Mode: Auditory Comprehension: 5-Understands complex 90% of the time/Cues < 10% of the time  Expression Expression Mode: Nonverbal Expression: 6-Expresses complex ideas: With extra time/assistive device  Social Interaction Social Interaction: 7-Interacts appropriately with others - No medications needed.  Problem Solving Problem Solving: 4-Solves basic 75 - 89% of the time/requires cueing 10 - 24% of the time  Memory Memory: 5-Requires cues to use assistive device   Medical Problem List and Plan:  1. Functional deficits secondary to right PCA infarct 2. DVT Prophylaxis/Anticoagulation: Subcutaneous Lovenox. Monitor  platelet counts and any signs of bleeding  3. Pain Management: Tylenol as needed, heat/stretching/strengthening for quads/proximal LE's  4. Neuropsych: This patient is capable of making decisions on his own behalf.  5. History of peripheral neuropathy. Continue Cymbalta.  6. Hypertension. Toprol-XL 12.5 mg daily. Monitor with increased mobility  7.  UTI, ecoli S to Keflex, D/C abx in am 8.  Hyper K likely hemolysis resolved A FACE TO FACE EVALUATION WAS PERFORMED  Charlett Blake 04/09/2014, 6:37 AM

## 2014-04-09 NOTE — Plan of Care (Signed)
Problem: RH BLADDER ELIMINATION Goal: RH STG MANAGE BLADDER WITH ASSISTANCE STG Manage Bladder With Min Assistance  Outcome: Not Progressing Condom cath at night, managed by staff

## 2014-04-09 NOTE — Progress Notes (Signed)
Social Work Patient ID: Carl Rios, male   DOB: [birth year], 80 y.o.   MRN: [id number] Spoke with Andrew-pt's son who was inquiring about pt's discharge plan.  Discussed with him the same issues have discussed with Rod-pt's other son. Have e-mailed Mitzi Hansen list of [facility] and he will go over with his wife who is a Therapist, sports and get back with this worker.  Discussed pt needs hands on care at this time and Would qualify for SNF but not for long term then would need to transition to lower level of care.  Mitzi Hansen to discuss with his wife and encouraged him to discuss with Rod and pt. Currently pt requires 24 hour physical care due to balance issues and requires cueing.  Will continue to work on discharge plans.

## 2014-04-09 NOTE — Progress Notes (Signed)
Occupational Therapy Session Note  Patient Details  Name: Carl Rios MRN: [id number] Date of Birth: December 11, 1925  Today's Date: 04/09/2014 Time: 0900-1000 Time Calculation (min): 60 min  Short Term Goals: Week 1:  OT Short Term Goal 1 (Week 1): Pt will be able to stand to adjust clothing before and after toileting with supervision only. OT Short Term Goal 2 (Week 1): Pt will be able to transfer to toilet using RW with supervision only.   OT Short Term Goal 3 (Week 1): Pt will be able to stand in shower with close S demonstrating improved balance without posterior or L lean. OT Short Term Goal 4 (Week 1): Pt will be able to do room exercise program for LUE with min cues.  Skilled Therapeutic Interventions/Progress Updates:    Pt seen for BADL retraining of shower, grooming, dressing with a focus on safety awareness, problem solving, and dynamic balance. Pt was much more aware of his situation today, discussing whether or not he should have care at home or NH placement.  He continues to need supervision, but his processing is faster along with more stable balance. No posterior or left leaning with LOB during the session.  Pt completed his self care and then ambulated to the gym with RW to work on activity tolerance with UE exercises and sit to stand skills from EOB without using UE support. Pt engaged well in these exercises and responded well to directions. He is standing up without UE support with no assist and gentle guiding assist to descend onto a low therapy mat.   Pt's PT had arrived for his next session.  Therapy Documentation Precautions:  Precautions Precautions: Fall Precaution Comments: HOH Restrictions Weight Bearing Restrictions: No    Vital Signs: Therapy Vitals Pulse Rate: 82 BP: 152/74 mmHg Pain: Pain Assessment Pain Assessment: No/denies pain ADL: ADL ADL Comments: Refer to FIM  See FIM for current functional status  Therapy/Group: Individual Therapy  Geoffery Lyons  Saguier 04/09/2014, 11:17 AM

## 2014-04-10 ENCOUNTER — Inpatient Hospital Stay (HOSPITAL_COMMUNITY): Payer: Medicare Other | Admitting: Occupational Therapy

## 2014-04-10 ENCOUNTER — Inpatient Hospital Stay (HOSPITAL_COMMUNITY): Payer: Medicare Other | Admitting: Speech Pathology

## 2014-04-10 ENCOUNTER — Inpatient Hospital Stay (HOSPITAL_COMMUNITY): Payer: Medicare Other

## 2014-04-10 DIAGNOSIS — R269 Unspecified abnormalities of gait and mobility: Secondary | ICD-10-CM

## 2014-04-10 DIAGNOSIS — I10 Essential (primary) hypertension: Secondary | ICD-10-CM

## 2014-04-10 DIAGNOSIS — M6282 Rhabdomyolysis: Secondary | ICD-10-CM

## 2014-04-10 DIAGNOSIS — I6330 Cerebral infarction due to thrombosis of unspecified cerebral artery: Secondary | ICD-10-CM

## 2014-04-10 MED ORDER — METOPROLOL TARTRATE 12.5 MG HALF TABLET
12.5000 mg | ORAL_TABLET | Freq: Two times a day (BID) | ORAL | Status: DC
  Administered 2014-04-10 – 2014-04-15 (×10): 12.5 mg via ORAL
  Filled 2014-04-10 (×14): qty 1

## 2014-04-10 NOTE — Progress Notes (Signed)
Speech Language Pathology Daily Session Note  Patient Details  Name: Carl Rios MRN: [id number] Date of Birth: May 08, 1926  Today's Date: 04/10/2014 Time: 3428-7681 Time Calculation (min): 45 min  Short Term Goals: Week 2: SLP Short Term Goal 1 (Week 2): Pt will improve working memory during functional tasks for 80% accuracy and min assist as needed.  SLP Short Term Goal 2 (Week 2): Pt will identify at least 2 physical deficits and 1 cognitive deficit and their impact on his functional independence at home with min assist.  SLP Short Term Goal 3 (Week 2): Pt will improve executive function for planning, thought organization, mental flexibility and error awareness during functional tasks for 80% accuracy and min assist as needed.    Skilled Therapeutic Interventions:  Pt was seen for skilled speech therapy targeting awareness and executive function.  Pt required min assist to sequence wheelchair propulsion.  Additionally, pt benefited from mod cuing to apply wheelchair brakes prior to standing up.  During functional conversations, pt required mod-max assist to identify at least 2 physical impairments as well as their impact on his functional independence post discharge.  SLP reviewed and reinforced recommendations for 24/7 supervision due to balance, strength, and cognitive impairments.  Pt stated that he felt he would be able to go back to driving and "doing things for [him]self" after rehab and required mod-max cuing for verbal reasoning to identify why that would not be feasible at this time.  Continue per current plan of care.   FIM:  Comprehension Comprehension Mode: Auditory Comprehension: 5-Follows basic conversation/direction: With extra time/assistive device Expression Expression: 5-Expresses basic needs/ideas: With extra time/assistive device Social Interaction Social Interaction: 5-Interacts appropriately 90% of the time - Needs monitoring or encouragement for participation or  interaction. Problem Solving Problem Solving: 4-Solves basic 75 - 89% of the time/requires cueing 10 - 24% of the time Memory Memory: 3-Recognizes or recalls 50 - 74% of the time/requires cueing 25 - 49% of the time  Pain Pain Assessment Pain Assessment: No/denies pain  Therapy/Group: Individual Therapy  [name], M.A. CCC-SLP  Arlyn Bumpus, Selinda Orion 04/10/2014, 4:37 PM

## 2014-04-10 NOTE — Progress Notes (Signed)
Occupational Therapy Weekly Progress Note  Patient Details  Name: Carl Rios MRN: [id number] Date of Birth: [birth year]  Beginning of progress report period: April 02, 2014 End of progress report period: April 10, 2014  Today's Date: 04/10/2014 Time: 9509-3267 Time Calculation (min): 50 min  Patient has met 4 of 4 short term goals.  Pt has made a great deal of progress with his balance. He no longer has a posterior or left lean during self care. He is now at a supervision level.  Patient continues to demonstrate the following deficits: decreased recall of new information and STM and decreased dynamic balance out of his base of support and therefore will continue to benefit from skilled OT intervention to enhance overall performance with BADL and iADL.  Patient not progressing toward long term goals.  See goal revision..  Plan of care revisions: Pt's initial goals set at mod I.  It is not possible to reach this high of a level of independence for this patient in the hospital environment due to decreased STM and his need for cues with safe transfers. Pt's goals changed to supervision.  He is able to toilet and bathe with distant supervision (standing outside the bathroom door) and dress with set up only.  .  OT Short Term Goals Week 1:  OT Short Term Goal 1 (Week 1): Pt will be able to stand to adjust clothing before and after toileting with supervision only. OT Short Term Goal 1 - Progress (Week 1): Met OT Short Term Goal 2 (Week 1): Pt will be able to transfer to toilet using RW with supervision only.   OT Short Term Goal 2 - Progress (Week 1): Met OT Short Term Goal 3 (Week 1): Pt will be able to stand in shower with close S demonstrating improved balance without posterior or L lean. OT Short Term Goal 3 - Progress (Week 1): Met OT Short Term Goal 4 (Week 1): Pt will be able to do room exercise program for LUE with min cues. OT Short Term Goal 4 - Progress (Week 1): Met Week 2:  OT Short  Term Goal 1 (Week 2): STGs = LTGs   Skilled Therapeutic Interventions/Progress Updates:   Pt seen for ADL retraining of grooming, toileting, bathing, dressing with a focus on safe functional mobility with the RW, dynamic balance, and memory to recall steps. As pt was walking to bathroom his walker wheels caught on threshold but pt did not lose his balance. He was able to doff shorts over feet standing up and using wall for support. He toileted and showered with distant supervision. His main obstacle to becoming more independent at this time is that he continues to need cues to pick out his clothing, how to step into the shower, how to take the hand held shower on and off the hook, how to position his body in his chair to increase ease with donning his socks.  These are all fairly simple things that will begin to become more rote for patient with increased time and a familiar setting. At this time, mod I goals are not the safest goals.  Pt completed all of his self care and was resting in the recliner with call light in reach and QRB.   Continue OT 5-7 days a week 60-90 min with Balance/vestibular training;Cognitive remediation/compensation;Discharge planning;DME/adaptive equipment instruction;Community reintegration;Functional mobility training;Neuromuscular re-education;Patient/family education;Psychosocial support;Self Care/advanced ADL retraining;Therapeutic Exercise;Therapeutic Activities;UE/LE Strength taining/ROM;UE/LE Coordination activities;Visual/perceptual remediation/compensation to maximize patient's independence with ADLs.  Therapy Documentation Precautions:  Precautions Precautions: Fall Precaution Comments: HOH Restrictions Weight Bearing Restrictions: No    Vital Signs: Therapy Vitals Pulse Rate: 66 BP: 139/72 mmHg Pain: Pain Assessment Pain Assessment: No/denies pain ADL: ADL ADL Comments: Refer to FIM  See FIM for current functional status  Therapy/Group: Individual  Therapy  Sweeny 04/10/2014, 9:57 AM

## 2014-04-10 NOTE — Progress Notes (Signed)
Social Work Patient ID: Carl Rios, male   DOB: 12/23/1925, 80 y.o.   MRN: [id number] Met with pt and spoke with [name] via telephone to discuss team conference progression toward his goals and discharge still 6/15. Rod feels having him go to a NH here in [place] better idea than going to [place], which brother is pursuing.  Pt would like to stay here In [place] rather than go to [place].  Rod still also looking into in home care and may want to meet with agency on Friday to discuss his Dad's Care needs.  Plan at this point is to look in [place] area for available NH beds and keep options open.  Have informed him of my absence and my office mate Lennart Pall will be covering while I am gone.  Have texted him Lucy's information per his request.  Will work on plans for Monday.

## 2014-04-10 NOTE — Progress Notes (Signed)
Subjective/Complaints: 80 y.o. right-handed male history of peripheral neuropathy. Presented 03/26/2014 after a fall and remained down for approximately 12 hours until found by family. Patient independent prior to admission living alone and driving. Noted total CK of 1550, troponin negative, white blood cell count 14,100 and BUN 27. Cranial CT scan with no evidence of intracranial abnormality noted chronic lacunar infarct at the right thalamus. Patient did not receive TPA. The thoracic and lumbar spines negative for acute changes. Pelvis films without fracture. MRI of the brain revealed an acute right PCA infarct. Carotid Dopplers with no ICA stenosis. Echocardiogram with ejection fraction of 01% grade 1 diastolic dysfunction   Review of Systems - neg except weakness Objective: Vital Signs: Blood pressure 153/69, pulse 63, temperature 98.7 F (37.1 C), temperature source Oral, resp. rate 19, height 5' 8"  (1.727 m), weight 89.721 kg (197 lb 12.8 oz), SpO2 93.00%. No results found. Results for orders placed during the hospital encounter of 04/01/14 (from the past 72 hour(s))  CREATININE, SERUM     Status: Abnormal   Collection Time    04/08/14  3:47 AM      Result Value Ref Range   Creatinine, Ser 0.91  0.50 - 1.35 mg/dL   GFR calc non Af Amer 75 (*) >90 mL/min   GFR calc Af Amer 86 (*) >90 mL/min   Comment: (NOTE)     The eGFR has been calculated using the CKD EPI equation.     This calculation has not been validated in all clinical situations.     eGFR's persistently <90 mL/min signify possible Chronic Kidney     Disease.  BASIC METABOLIC PANEL     Status: Abnormal   Collection Time    04/08/14  7:30 AM      Result Value Ref Range   Sodium 138  137 - 147 mEq/L   Potassium 4.4  3.7 - 5.3 mEq/L   Chloride 101  96 - 112 mEq/L   CO2 25  19 - 32 mEq/L   Glucose, Bld 104 (*) 70 - 99 mg/dL   BUN 15  6 - 23 mg/dL   Creatinine, Ser 0.88  0.50 - 1.35 mg/dL   Calcium 9.2  8.4 - 10.5 mg/dL   GFR calc non Af Amer 76 (*) >90 mL/min   GFR calc Af Amer 88 (*) >90 mL/min   Comment: (NOTE)     The eGFR has been calculated using the CKD EPI equation.     This calculation has not been validated in all clinical situations.     eGFR's persistently <90 mL/min signify possible Chronic Kidney     Disease.     HEENT: normal Cardio: irreg, irreg and no murmurs Resp: CTA B/L and unlabored GI: BS positive and NT Extremity:  Pulses positive and No Edema Skin:   Intact Neuro: Alert/Oriented, Cranial Nerve II-XII normal, Normal Sensory LT RUE, and LUE, Reduce in LLE, tactile inattention LUE, Normal Motor and Abnormal FMC Ataxic/ dec FMC Left hemiataxia Musc/Skel:  Normal Gen NAD   Assessment/Plan: 1. Functional deficits secondary to R PCA infarct which require 3+ hours per day of interdisciplinary therapy in a comprehensive inpatient rehab setting. Physiatrist is providing close team supervision and 24 hour management of active medical problems listed below. Physiatrist and rehab team continue to assess barriers to discharge/monitor patient progress toward functional and medical goals.  FIM: FIM - Bathing Bathing Steps Patient Completed: Chest;Right Arm;Left Arm;Buttocks;Front perineal area;Abdomen;Right upper leg;Left upper leg;Right lower leg (including foot);Left  lower leg (including foot) Bathing: 5: Supervision: Safety issues/verbal cues  FIM - Upper Body Dressing/Undressing Upper body dressing/undressing steps patient completed: Thread/unthread right sleeve of pullover shirt/dresss;Thread/unthread left sleeve of pullover shirt/dress;Put head through opening of pull over shirt/dress;Pull shirt over trunk Upper body dressing/undressing: 6: More than reasonable amount of time FIM - Lower Body Dressing/Undressing Lower body dressing/undressing steps patient completed: Thread/unthread right pants leg;Thread/unthread left pants leg;Pull pants up/down;Fasten/unfasten pants;Don/Doff right  shoe;Don/Doff left shoe;Thread/unthread right underwear leg;Thread/unthread left underwear leg;Pull underwear up/down Lower body dressing/undressing: 5: Set-up assist to: Obtain clothing  FIM - Toileting Toileting steps completed by patient: Adjust clothing prior to toileting;Performs perineal hygiene;Adjust clothing after toileting Toileting Assistive Devices: Grab bar or rail for support Toileting: 5: Supervision: Safety issues/verbal cues  FIM - Radio producer Devices: Mining engineer Transfers: 4-To toilet/BSC: Min A (steadying Pt. > 75%);5-From toilet/BSC: Supervision (verbal cues/safety issues)  FIM - Engineer, site Assistive Devices: Arm rests;Walker Bed/Chair Transfer: 5: Bed > Chair or W/C: Supervision (verbal cues/safety issues);4: Chair or W/C > Bed: Min A (steadying Pt. > 75%)  FIM - Locomotion: Wheelchair Distance: 150 Locomotion: Wheelchair: 0: Activity did not occur FIM - Locomotion: Ambulation Locomotion: Ambulation Assistive Devices: Administrator Ambulation/Gait Assistance: 5: Supervision Locomotion: Ambulation: 5: Travels 150 ft or more with supervision/safety issues  Comprehension Comprehension Mode: Auditory Comprehension: 5-Understands complex 90% of the time/Cues < 10% of the time  Expression Expression Mode: Nonverbal Expression: 6-Expresses complex ideas: With extra time/assistive device  Social Interaction Social Interaction: 7-Interacts appropriately with others - No medications needed.  Problem Solving Problem Solving: 4-Solves basic 75 - 89% of the time/requires cueing 10 - 24% of the time  Memory Memory: 5-Requires cues to use assistive device   Medical Problem List and Plan:  1. Functional deficits secondary to right PCA infarct 2. DVT Prophylaxis/Anticoagulation: Subcutaneous Lovenox. Monitor platelet counts and any signs of bleeding  3. Pain Management: Tylenol as needed,  heat/stretching/strengthening for quads/proximal LE's  4. Neuropsych: This patient is capable of making decisions on his own behalf.  5. History of peripheral neuropathy. Continue Cymbalta.  6. Hypertension. Toprol-XL 12.5 mg daily. Monitor with increased mobility  7.  UTI, ecoli S to Keflex, D/C abx in am 8.  Hyper K likely hemolysis resolved 9.  Bradycardia and irregular rythmn, will check EKG, may need to reduce BB A FACE TO FACE EVALUATION WAS PERFORMED  Lamoyne Hessel E 04/10/2014, 7:38 AM

## 2014-04-10 NOTE — Progress Notes (Signed)
Social Work Patient ID: Carl Rios, male   DOB: Mar 05, 1926, 80 y.o.   MRN: [id number] Spoke with Beth-daughter in-law to discuss pt's team conference goals and progress.  They are currently visiting [place] area facilities regarding pt going down there Since would be closer to more family members.  They will let Lorre Nick know which ones have availability and if paperwork from here is needed.  Have given her Lucy's contact numbers and she  Will let her know what information is needed once tours are finished today and a decision is made.  They are aware they need to discuss with pt and he needs to agree.

## 2014-04-10 NOTE — Progress Notes (Signed)
Social Work Elease Hashimoto, LCSW Social Worker Signed  Patient Care Conference Service date: 04/10/2014 1:23 PM  Inpatient RehabilitationTeam Conference and Plan of Care Update Date: 04/10/2014   Time: 11:20 AM     Patient Name: Carl Rios       Medical Record Number: [id number]   Date of Birth: 06/11/1926 Sex: Male         Room/Bed: 4M06C/4M06C-01 Payor Info: Payor: MEDICARE / Plan: MEDICARE PART A AND B / Product Type: *No Product type* /   Admitting Diagnosis: polytrauma   Admit Date/Time:  04/01/2014  4:05 PM Admission Comments: No comment available   Primary Diagnosis:  <principal problem not specified> Principal Problem: <principal problem not specified>    Patient Active Problem List     Diagnosis  Date Noted   .  Recurrent falls  04/01/2014   .  Paroxysmal SVT (supraventricular tachycardia)  04/01/2014   .  Unsteady gait  04/01/2014   .  CVA (cerebral infarction)  03/31/2014   .  Acute renal failure  03/27/2014   .  Dehydration  03/27/2014   .  Rhabdomyolysis  03/26/2014   .  AAA (abdominal aortic aneurysm) without rupture  08/14/2013   .  Abnormality of gait  01/08/2013   .  Other general symptoms(780.99)  01/08/2013   .  Other vitamin B12 deficiency anemia  01/08/2013   .  Polyneuropathy in other diseases classified elsewhere  01/08/2013   .  Abdominal aneurysm without mention of rupture  02/01/2012   .  Hypokalemia  01/25/2012   .  Anemia due to acute blood loss  01/24/2012   .  Benign neoplasm of colon  01/24/2012   .  Hypertension  01/19/2012   .  Other and unspecified hyperlipidemia  01/18/2012   .  Impaired fasting glucose  01/18/2012   .  Unspecified hereditary and idiopathic peripheral neuropathy  01/18/2012   .  Hypertonicity of bladder  01/18/2012   .  Diverticulosis  01/18/2012   .  Rectal bleeding  01/18/2012   .  GERD  02/09/2010   .  COLONIC POLYPS, ADENOMATOUS, HX OF  02/09/2010     Expected Discharge Date: Expected Discharge Date:  04/15/14  Team Members Present: Physician leading conference: Dr. Alysia Penna Social Worker Present: Ovidio Kin, LCSW Nurse Present: Elliot Cousin, RN PT Present: Raylene Everts, PT;Caroline Lacinda Axon, PT OT Present: Antony Salmon, OT SLP Present: Other (comment) Elmyra Ricks Page-SP) PPS Coordinator present : Ileana Ladd, Lelan Pons, RN, CRRN        Current Status/Progress  Goal  Weekly Team Focus   Medical     Bradycardia and irregular cardiac rhythm  maintain medical stability for D/C to SNF  EKG, may adjust beta blocker   Bowel/Bladder     Incontinent of bowel and bladder at times.  Mostly continent during the day. Condom cath@hs .  LBM 04/09/14. Incontinet because of urgency  Pt to remain continetn of bowel and bladder  Due to urgency toilet q 2-3 hrs   Swallow/Nutrition/ Hydration     Mod I with regular solids and thin liquids  mod I with least restrictive diet   diet tolerance    ADL's     supervision with basic ADLs and transfers. Need to address light meal prep this week. Improved continence and LUE coordination and strength.  downgrading goals to supervision versus mod I as pt has impaired memory from day to day and needs frequent cuing.  ADL retraining, balance activities,  pt education   Mobility     S/Min A transfers, gait x150' wtih RW, and stairs, S bed mobility  Supervision gait x150', Mod I bed mobility and transfers, Mod I WC x150' and 37/56 Berg  Neuro re-ed, static/dynamic balance, memory, gait in varying conditions, family ed   Communication     n/a  n/a  n/a   Safety/Cognition/ Behavioral Observations    min-mod assist for memory, executive function, safety awareness/insigh   min assist-supervision   higher level cognitive tasks    Pain     No c/o p ain  <3  Monitor for nonverbal cues   Skin     Intermittent bruising. Scab on L shin, OTA, unremarkable  No additional skin breakdown   Routine turn q 2hrs     *See Care Plan and progress notes for long and  short-term goals.    Barriers to Discharge:  needs 24/7 care      Possible Resolutions to Barriers:    SNF     Discharge Planning/Teaching Needs:    Son's coming up with a plan for pt aware of recommendation of 24 hr care, pursuing short term NHP and hired assistance, not assisted living level at this time      Team Discussion:    Irregular heartbeat-EKG adjusting lopressor-MD managing.  Balance and memory issues which make him more hands on with therapy and recommendation is hands on assist.  Looking at all options of NH versus hired care at home.  Son's need to make a decision-pt agreeable to either   Revisions to Treatment Plan:    Downgraded goals to supervision level    Continued Need for Acute Rehabilitation Level of Care: The patient requires daily medical management by a physician with specialized training in physical medicine and rehabilitation for the following conditions: Daily direction of a multidisciplinary physical rehabilitation program to ensure safe treatment while eliciting the highest outcome that is of practical value to the patient.: Yes Daily medical management of patient stability for increased activity during participation in an intensive rehabilitation regime.: Yes Daily analysis of laboratory values and/or radiology reports with any subsequent need for medication adjustment of medical intervention for : Neurological problems;Other  Rudolph Daoust, Gardiner Rhyme 04/10/2014, 1:24 PM         Elease Hashimoto, LCSW Social Worker Signed  Patient Care Conference Service date: 04/03/2014 2:54 PM  Inpatient RehabilitationTeam Conference and Plan of Care Update Date: 04/03/2014   Time: 11;15 AM     Patient Name: WILLLIAM Rios       Medical Record Number: [id number]   Date of Birth: 08-30-1926 Sex: Male         Room/Bed: 4W07C/4W07C-01 Payor Info: Payor: MEDICARE / Plan: MEDICARE PART A AND B / Product Type: *No Product type* /   Admitting Diagnosis: polytrauma   Admit  Date/Time:  04/01/2014  4:05 PM Admission Comments: No comment available   Primary Diagnosis:  <principal problem not specified> Principal Problem: <principal problem not specified>    Patient Active Problem List     Diagnosis  Date Noted   .  Recurrent falls  04/01/2014   .  Paroxysmal SVT (supraventricular tachycardia)  04/01/2014   .  Unsteady gait  04/01/2014   .  CVA (cerebral infarction)  03/31/2014   .  Acute renal failure  03/27/2014   .  Dehydration  03/27/2014   .  Rhabdomyolysis  03/26/2014   .  AAA (abdominal aortic aneurysm) without rupture  08/14/2013   .  Abnormality of gait  01/08/2013   .  Other general symptoms(780.99)  01/08/2013   .  Other vitamin B12 deficiency anemia  01/08/2013   .  Polyneuropathy in other diseases classified elsewhere  01/08/2013   .  Abdominal aneurysm without mention of rupture  02/01/2012   .  Hypokalemia  01/25/2012   .  Anemia due to acute blood loss  01/24/2012   .  Benign neoplasm of colon  01/24/2012   .  Hypertension  01/19/2012   .  Other and unspecified hyperlipidemia  01/18/2012   .  Impaired fasting glucose  01/18/2012   .  Unspecified hereditary and idiopathic peripheral neuropathy  01/18/2012   .  Hypertonicity of bladder  01/18/2012   .  Diverticulosis  01/18/2012   .  Rectal bleeding  01/18/2012   .  GERD  02/09/2010   .  COLONIC POLYPS, ADENOMATOUS, HX OF  02/09/2010     Expected Discharge Date: Expected Discharge Date: 04/15/14  Team Members Present: Physician leading conference: Dr. Alysia Penna Social Worker Present: Ovidio Kin, LCSW Nurse Present: Elliot Cousin, RN PT Present: Raylene Everts, PT;Blair Hobble, Renaye Rakers, PT OT Present: Antony Salmon, OT SLP Present: Other (comment) Elmyra Ricks Page-SPT) PPS Coordinator present : Ileana Ladd, Lelan Pons, RN, CRRN        Current Status/Progress  Goal  Weekly Team Focus   Medical     R PCA, mild neglect, HOH, occ cog cues, receptive to cues  Mod I  meds, reduce impulsivity  eval for UTI, eval bladder incont   Bowel/Bladder     Incontinent of bowel and bladder, Miralax 17g daily, sorbitol 17ml prn. Condom cath  Stay free of infection and skin break down.  Monitor bowel movements and output daily.   Swallow/Nutrition/ Hydration     upraded to regular solids, thin liquids with intermittent supervision   mod I with least restrictive diet   diet tolerance    ADL's     steady A with basic ADLs., mod A with ambulation/ transfers with RW  mod I with basic ADLs, mod I toilet transfer, supervision for shower transfer, light meal prep and homemaking  ADL retraining, balance activities, UE coordination and strength, pt education   Mobility     Mod A transfers and gait x45' with RW, min A bed mobility, S WC x150', 15/56 Berg balance test  Supervision gait x150', Mod I bed mobility and transfers, Mod I WC x150' and 37/56 Berg  transfer training, strengthening, gait with RW, static and dynamic balance   Communication     n/a  n/a  n/a   Safety/Cognition/ Behavioral Observations    min-mod assist for memory, executive function, safety awareness/insight   min assist-supervision   strategy education    Pain     Tylenol 325-650 q4h prn. Pain level 0 on a scale of 0-10  Continue to monitor for verbalizing or nonverbal signs of pain Q shift  Patient at <3 on pain scale.   Skin     N/A  Keep skin free of infection and breakdown.  Monitor skin q shift.     *See Care Plan and progress notes for long and short-term goals.    Barriers to Discharge:  Needs 24/7 plan      Possible Resolutions to Barriers:    sons in [place] and [place]      Discharge Planning/Teaching Needs:    Home with caregiver and son to assit, still coming up with a plan  Team Discussion:    Treating UTI-balance and safety issues-will recommend 24 hr supervision at discharge.  Making progress, but will need to decide hired assist versus ALF   Revisions to Treatment  Plan:    None    Continued Need for Acute Rehabilitation Level of Care: The patient requires daily medical management by a physician with specialized training in physical medicine and rehabilitation for the following conditions: Daily direction of a multidisciplinary physical rehabilitation program to ensure safe treatment while eliciting the highest outcome that is of practical value to the patient.: Yes Daily medical management of patient stability for increased activity during participation in an intensive rehabilitation regime.: Yes Daily analysis of laboratory values and/or radiology reports with any subsequent need for medication adjustment of medical intervention for : Neurological problems  Elease Hashimoto 04/03/2014, 2:54 PM          Patient ID: Lilly [place], male   DOB: 22-Feb-1926, 80 y.o.   MRN: [id number]

## 2014-04-10 NOTE — Progress Notes (Signed)
Physical Therapy Session Note  Patient Details  Name: Carl Rios MRN: [id number] Date of Birth: 17-Jun-1926  Today's Date: 04/10/2014 Time: 0950-1030; 1300-1400 Time Calculation (min): 40 min, 60 min  Short Term Goals: Week 1:  PT Short Term Goal 1 (Week 1): STG=LTG due to short LOS, Supervision overall including 150' and 5 steps    Skilled Therapeutic Interventions/Progress Updates:   Basic transfer LT goal downgraded to supervision due to memory deficits for safe use of RW. Tx 1:  neuromuscular re-education via demo, VCS, set-up for self stretching bil hamstrings and heel cords; pelvic dissociation in high unsupported sitting for balance retraining, repetitive sit>< stand without use of UEs biased to LLE; facilitation of balance strategies in standing  Consistent LOB backwards and to L upon sit> stand; pt compensates by pressing LEs against support surface, a long standing compensation, probably.  Gait x 150' with RW, close supervision, x 2. Pt reported that he feels much looser and better ambulating after tx. Pt consistently attempts to pull up on RW and forgets to reach back before sitting.  Pt consistently turns the wrong way when exiting his room, which was changed 5 days ago.  He remembered some components of self stretching program.  He is able to state safety issues after cueing, but not able to remember them during functional mobility.  Quick release belt applied to pt in recliner at the end of the session.  Tx  2:  Pt was wet due to incontience sitting in recliner.  He had tried to use call bell for urgency, but call bell not working.  NT notified of call bell problem; she has called Facilities.  Gait in room during change of clothes, with close supervision.  Sit>< stand repeatedly during clothing change with tactile cues each time for safe hand placement.  Gait x 150' x 2 with close supervision , focus on upright trunk, forward gaze.  Simulated car transfer with min assist  for hand placement as above. Up/down 4 steps 2 rails with self selected sequence.  Poor eccentric control noted when pt descnends leading with RLE, but he stated he chooses this due to L knee pain due to fall at time at [facility].  Gait through nurse's station.  Pt agreed to sit in rolling chair, but still did not remember to reach back before sitting, requiring mod assist for safety. Repeated trials resulted in better safety with reaching backwards.  Pt stated his office chair at home is wheeled, but on carpet, so it has not rolled.  neuromuscular re-education via hand out, demo, VCs for calf raises/toe raises x 5 seconds x 5, and standing hip abd 10 x 1 each L and R-  to improve balance reactions.  L ankle DF improved recently.    Therapy Documentation Precautions:  Precautions Precautions: Fall Precaution Comments: HOH Restrictions Weight Bearing Restrictions: No    See FIM for current functional status  Therapy/Group: Individual Therapy  Jesika Men 04/10/2014, 10:36 AM

## 2014-04-10 NOTE — Patient Care Conference (Signed)
Inpatient RehabilitationTeam Conference and Plan of Care Update Date: 04/10/2014   Time: 11:20 AM    Patient Name: Carl Rios      Medical Record Number: [id number]  Date of Birth: January 11, 1926 Sex: Male         Room/Bed: 4M06C/4M06C-01 Payor Info: Payor: MEDICARE / Plan: MEDICARE PART A AND B / Product Type: *No Product type* /    Admitting Diagnosis: polytrauma  Admit Date/Time:  04/01/2014  4:05 PM Admission Comments: No comment available   Primary Diagnosis:  <principal problem not specified> Principal Problem: <principal problem not specified>  Patient Active Problem List   Diagnosis Date Noted  . Recurrent falls 04/01/2014  . Paroxysmal SVT (supraventricular tachycardia) 04/01/2014  . Unsteady gait 04/01/2014  . CVA (cerebral infarction) 03/31/2014  . Acute renal failure 03/27/2014  . Dehydration 03/27/2014  . Rhabdomyolysis 03/26/2014  . AAA (abdominal aortic aneurysm) without rupture 08/14/2013  . Abnormality of gait 01/08/2013  . Other general symptoms(780.99) 01/08/2013  . Other vitamin B12 deficiency anemia 01/08/2013  . Polyneuropathy in other diseases classified elsewhere 01/08/2013  . Abdominal aneurysm without mention of rupture 02/01/2012  . Hypokalemia 01/25/2012  . Anemia due to acute blood loss 01/24/2012  . Benign neoplasm of colon 01/24/2012  . Hypertension 01/19/2012  . Other and unspecified hyperlipidemia 01/18/2012  . Impaired fasting glucose 01/18/2012  . Unspecified hereditary and idiopathic peripheral neuropathy 01/18/2012  . Hypertonicity of bladder 01/18/2012  . Diverticulosis 01/18/2012  . Rectal bleeding 01/18/2012  . GERD 02/09/2010  . COLONIC POLYPS, ADENOMATOUS, HX OF 02/09/2010    Expected Discharge Date: Expected Discharge Date: 04/15/14  Team Members Present: Physician leading conference: Dr. Alysia Penna Social Worker Present: Ovidio Kin, LCSW Nurse Present: Elliot Cousin, RN PT Present: Raylene Everts, PT;Caroline Lacinda Axon,  PT OT Present: Antony Salmon, OT SLP Present: Other (comment) Elmyra Ricks Page-SP) PPS Coordinator present : Ileana Ladd, Lelan Pons, RN, CRRN     Current Status/Progress Goal Weekly Team Focus  Medical   Bradycardia and irregular cardiac rhythm  maintain medical stability for D/C to SNF  EKG, may adjust beta blocker   Bowel/Bladder   Incontinent of bowel and bladder at times.  Mostly continent during the day. Condom cath@hs .  LBM 04/09/14. Incontinet because of urgency  Pt to remain continetn of bowel and bladder  Due to urgency toilet q 2-3 hrs   Swallow/Nutrition/ Hydration   Mod I with regular solids and thin liquids  mod I with least restrictive diet   diet tolerance    ADL's   supervision with basic ADLs and transfers. Need to address light meal prep this week. Improved continence and LUE coordination and strength.  downgrading goals to supervision versus mod I as pt has impaired memory from day to day and needs frequent cuing.  ADL retraining, balance activities, pt education   Mobility   S/Min A transfers, gait x150' wtih RW, and stairs, S bed mobility  Supervision gait x150', Mod I bed mobility and transfers, Mod I WC x150' and 37/56 Berg  Neuro re-ed, static/dynamic balance, memory, gait in varying conditions, family ed   Communication   n/a  n/a  n/a   Safety/Cognition/ Behavioral Observations  min-mod assist for memory, executive function, safety awareness/insigh   min assist-supervision   higher level cognitive tasks    Pain   No c/o p ain  <3  Monitor for nonverbal cues   Skin   Intermittent bruising. Scab on L shin, OTA, unremarkable  No additional skin breakdown  Routine turn q 2hrs      *See Care Plan and progress notes for long and short-term goals.  Barriers to Discharge: needs 24/7 care    Possible Resolutions to Barriers:  SNF    Discharge Planning/Teaching Needs:  Son's coming up with a plan for pt aware of recommendation of 24 hr care, pursuing short  term NHP and hired assistance, not assisted living level at this time      Team Discussion:  Irregular heartbeat-EKG adjusting lopressor-MD managing.  Balance and memory issues which make him more hands on with therapy and recommendation is hands on assist.  Looking at all options of NH versus hired care at home.  Son's need to make a decision-pt agreeable to either  Revisions to Treatment Plan:  Downgraded goals to supervision level   Continued Need for Acute Rehabilitation Level of Care: The patient requires daily medical management by a physician with specialized training in physical medicine and rehabilitation for the following conditions: Daily direction of a multidisciplinary physical rehabilitation program to ensure safe treatment while eliciting the highest outcome that is of practical value to the patient.: Yes Daily medical management of patient stability for increased activity during participation in an intensive rehabilitation regime.: Yes Daily analysis of laboratory values and/or radiology reports with any subsequent need for medication adjustment of medical intervention for : Neurological problems;Other  Stamatia Masri, Gardiner Rhyme 04/10/2014, 1:24 PM

## 2014-04-11 ENCOUNTER — Inpatient Hospital Stay (HOSPITAL_COMMUNITY): Payer: Medicare Other | Admitting: Speech Pathology

## 2014-04-11 ENCOUNTER — Inpatient Hospital Stay (HOSPITAL_COMMUNITY): Payer: Medicare Other | Admitting: Occupational Therapy

## 2014-04-11 ENCOUNTER — Inpatient Hospital Stay (HOSPITAL_COMMUNITY): Payer: Medicare Other

## 2014-04-11 NOTE — Progress Notes (Signed)
Physical Therapy Session Note  Patient Details  Name: KNOLAN SIMIEN MRN: [id number] Date of Birth: 1925/11/24  Today's Date: 04/11/2014 Time: 1100-1200 Time Calculation (min): 60 min  Short Term Goals: Week 1:  PT Short Term Goal 1 (Week 1): STG=LTG due to short LOS, Supervision gait x150' and stairs, Mod I transfers    Skilled Therapeutic Interventions/Progress Updates:   neuromuscular re-education via forced use, demo, manual cues for pelvic dissociation in unsupported sitting, bil calf raises and toe raises 8 x 1, gait without AD > gait while transporting bulky object, retro gait with 2 HHA> 1 HHA, sidestepping L and R, repetitive sit>< stand from decreasing height surfaces without use of UEs, step/taps L and R onto 4" high stool for wt shifting.  Pt's COG over feet rather than L and posterior, by end of session.  Gait with RW x 150' x 2 with supervision.  Pt tends to "list" L upon standing due to over-shifting to L initially, causing rapid stepping to regain a straight path.  Up/down 5 steps 2 rails x 2, with min guard assist due to pt not placing entire L foot onto tread, and slight LOB backwards several times, which pt regains by pulling on railings.  Pt remembered details of self stretching hamstrings and heel cords with 1 VC and 1 tactile cue.      Therapy Documentation Precautions:  Precautions Precautions: Fall Precaution Comments: HOH Restrictions Weight Bearing Restrictions: No   Vital Signs: Therapy Vitals Pulse Rate: 87 BP: 135/76 mmHg   Locomotion : Ambulation Ambulation/Gait Assistance: 5: Supervision     See FIM for current functional status  Therapy/Group: Individual Therapy  Laterica Matarazzo 04/11/2014, 12:23 PM

## 2014-04-11 NOTE — Progress Notes (Signed)
Speech Language Pathology Daily Session Note  Patient Details  Name: Carl Rios MRN: [id number] Date of Birth: 1925/12/12  Today's Date: 04/11/2014 Time: 7867-6720 Time Calculation (min): 45 min  Short Term Goals: Week 2: SLP Short Term Goal 1 (Week 2): Pt will improve working memory during functional tasks for 80% accuracy and min assist as needed.  SLP Short Term Goal 2 (Week 2): Pt will identify at least 2 physical deficits and 1 cognitive deficit and their impact on his functional independence at home with min assist.  SLP Short Term Goal 3 (Week 2): Pt will improve executive function for planning, thought organization, mental flexibility and error awareness during functional tasks for 80% accuracy and min assist as needed.    Skilled Therapeutic Interventions:  Pt was seen for skilled speech therapy targeting working memory and executive function.  Upon arrival, pt requested to get out of bed and get dressed as he had not had any therapy yet this morning.  SLP assisted pt to the bathroom, providing supervision cues for proper sequencing and initiation for safety awareness during basic ADLs and self care tasks.  Once his self care was completed to his satisfaction, SLP facilitated the session with a functional organizational task of the pt's personal items.  Pt gathered clothing items from around his room and sorted them into dirty and clean piles with supervision cuing for organization, planning, and sequencing and min cuing for working memory as pt exhibited decreased recall of task parameters.  Pt also benefited from min-mod cuing for safety during functional mobility within his room as he was noted to bump into obstacles and forget appropriate foot placement when using a walker.    FIM:  Comprehension Comprehension Mode: Auditory Comprehension: 5-Follows basic conversation/direction: With extra time/assistive device Expression Expression Mode: Verbal Expression: 5-Expresses basic  needs/ideas: With extra time/assistive device Social Interaction Social Interaction: 5-Interacts appropriately 90% of the time - Needs monitoring or encouragement for participation or interaction. Problem Solving Problem Solving: 4-Solves basic 75 - 89% of the time/requires cueing 10 - 24% of the time Memory Memory: 3-Recognizes or recalls 50 - 74% of the time/requires cueing 25 - 49% of the time  Pain Pain Assessment Pain Assessment: No/denies pain  Therapy/Group: Individual Therapy  [name], M.A. CCC-SLP   Lacora Folmer, Selinda Orion 04/11/2014, 2:04 PM

## 2014-04-11 NOTE — Progress Notes (Signed)
Occupational Therapy Session Note  Patient Details  Name: Carl Rios MRN: [id number] Date of Birth: [birth year]  Today's Date: 04/11/2014 Time: 1015-1100 and 1330-1415 Time Calculation (min): 45 min and 45 min  Short Term Goals: Week 1:  OT Short Term Goal 1 (Week 1): Pt will be able to stand to adjust clothing before and after toileting with supervision only. OT Short Term Goal 1 - Progress (Week 1): Met OT Short Term Goal 2 (Week 1): Pt will be able to transfer to toilet using RW with supervision only.   OT Short Term Goal 2 - Progress (Week 1): Met OT Short Term Goal 3 (Week 1): Pt will be able to stand in shower with close S demonstrating improved balance without posterior or L lean. OT Short Term Goal 3 - Progress (Week 1): Met OT Short Term Goal 4 (Week 1): Pt will be able to do room exercise program for LUE with min cues. OT Short Term Goal 4 - Progress (Week 1): Met Week 2:  OT Short Term Goal 1 (Week 2): STGs = LTGs   Skilled Therapeutic Interventions/Progress Updates:    Visit 1:  Pain:  No c/o Tx:   Pt seen for ADL retraining of simple home management activities and toileting with a focus on problem solving and balance. Pt was already dressed for the day.  Pt had new clothing that was in bags, so pt worked on Therapist, music on hangers and placing clothing in drawers with minimal cuing but no LOB.  He toileted and washed hands with just S and then walked to kitchen.  In kitchen he worked on removing and placing cans and tall glasses from the cupboards using both his L and R hands. Pt stated that he does not cook anymore, just toast or frozen waffles and eats out later in the day.  Pt amb to the gym. He now has equal finger to nose rate on both hands with improved smoothness of movement on L and both hands have 30 lbs grip.  Pt's PT had arrived for his next session.   Visit 2:  Pain: No c/o pain. Tx: Pt seen this session to work on dynamic balance activities to increase his  safety with self care. Pt ambulated to the gym and began working on tap ups on bench 10x each leg with increased support needed as he tapped L leg up. Dynamic reaching in sitting with no LE support to each side using arms to counterbalance.  Sit to stand and standing with reaching large ball overhead and trunk rotation activities.  Pt ambulated back to room and decided to lay down in bed to rest. Pt provided with a drink, call light and phone. Bed alarm set.  Therapy Documentation Precautions:  Precautions Precautions: Fall Precaution Comments: HOH Restrictions Weight Bearing Restrictions: No      ADL: ADL ADL Comments: Refer to FIM  See FIM for current functional status  Therapy/Group: Individual Therapy  [place] 04/11/2014, 12:43 PM

## 2014-04-11 NOTE — Progress Notes (Signed)
Subjective/Complaints: 80 y.o. right-handed male history of peripheral neuropathy. Presented 03/26/2014 after a fall and remained down for approximately 12 hours until found by family. Patient independent prior to admission living alone and driving. Noted total CK of 1550, troponin negative, white blood cell count 14,100 and BUN 27. Cranial CT scan with no evidence of intracranial abnormality noted chronic lacunar infarct at the right thalamus. Patient did not receive TPA. The thoracic and lumbar spines negative for acute changes. Pelvis films without fracture. MRI of the brain revealed an acute right PCA infarct. Carotid Dopplers with no ICA stenosis. Echocardiogram with ejection fraction of 75% grade 1 diastolic dysfunction  Pt without c/o no SOB Review of Systems - neg except weakness Objective: Vital Signs: Blood pressure 157/85, pulse 77, temperature 97.7 F (36.5 C), temperature source Oral, resp. rate 17, height _0  (1.727 m), weight 89.721 kg (197 lb 12.8 oz), SpO2 94.00%. No results found. Results for orders placed during the hospital encounter of 04/01/14 (from the past 72 hour(s))  BASIC METABOLIC PANEL     Status: Abnormal   Collection Time    04/08/14  7:30 AM      Result Value Ref Range   Sodium 138  137 - 147 mEq/L   Potassium 4.4  3.7 - 5.3 mEq/L   Chloride 101  96 - 112 mEq/L   CO2 25  19 - 32 mEq/L   Glucose, Bld 104 (*) 70 - 99 mg/dL   BUN 15  6 - 23 mg/dL   Creatinine, Ser 0.88  0.50 - 1.35 mg/dL   Calcium 9.2  8.4 - 10.5 mg/dL   GFR calc non Af Amer 76 (*) >90 mL/min   GFR calc Af Amer 88 (*) >90 mL/min   Comment: (NOTE)     The eGFR has been calculated using the CKD EPI equation.     This calculation has not been validated in all clinical situations.     eGFR's persistently <90 mL/min signify possible Chronic Kidney     Disease.     HEENT: normal Cardio: irreg, irreg and no murmurs Resp: CTA B/L and unlabored GI: BS positive and NT Extremity:  Pulses  positive and No Edema Skin:   Intact Neuro: Alert/Oriented, Cranial Nerve II-XII normal, Normal Sensory LT RUE, and LUE, Reduce in LLE, tactile inattention LUE, Normal Motor and Abnormal FMC Ataxic/ dec FMC Left hemiataxia Musc/Skel:  Normal Gen NAD   Assessment/Plan: 1. Functional deficits secondary to R PCA infarct which require 3+ hours per day of interdisciplinary therapy in a comprehensive inpatient rehab setting. Physiatrist is providing close team supervision and 24 hour management of active medical problems listed below. Physiatrist and rehab team continue to assess barriers to discharge/monitor patient progress toward functional and medical goals. As discussed with pt , will need 24/7 care which can be done in home with hired caregivers or in SNF, overall expect improvement in cognition over the next 1-2 mo FIM: FIM - Bathing Bathing Steps Patient Completed: Chest;Right Arm;Left Arm;Buttocks;Front perineal area;Abdomen;Right upper leg;Left upper leg;Right lower leg (including foot);Left lower leg (including foot) Bathing: 5: Supervision: Safety issues/verbal cues  FIM - Upper Body Dressing/Undressing Upper body dressing/undressing steps patient completed: Thread/unthread right sleeve of pullover shirt/dresss;Thread/unthread left sleeve of pullover shirt/dress;Put head through opening of pull over shirt/dress;Pull shirt over trunk Upper body dressing/undressing: 6: More than reasonable amount of time FIM - Lower Body Dressing/Undressing Lower body dressing/undressing steps patient completed: Thread/unthread right pants leg;Thread/unthread left pants leg;Pull pants up/down;Fasten/unfasten pants;Don/Doff  right shoe;Don/Doff left shoe;Thread/unthread right underwear leg;Thread/unthread left underwear leg;Pull underwear up/down;Don/Doff right sock;Don/Doff left sock Lower body dressing/undressing: 5: Set-up assist to: Obtain clothing  FIM - Toileting Toileting steps completed by  patient: Adjust clothing prior to toileting;Performs perineal hygiene;Adjust clothing after toileting Toileting Assistive Devices: Grab bar or rail for support Toileting: 5: Supervision: Safety issues/verbal cues  FIM - Radio producer Devices: Mining engineer Transfers: 5-To toilet/BSC: Supervision (verbal cues/safety issues);5-From toilet/BSC: Supervision (verbal cues/safety issues)  FIM - Engineer, site Assistive Devices: Arm rests;Walker Bed/Chair Transfer: 5: Bed > Chair or W/C: Supervision (verbal cues/safety issues);4: Chair or W/C > Bed: Min A (steadying Pt. > 75%)  FIM - Locomotion: Wheelchair Distance: 150 Locomotion: Wheelchair: 0: Activity did not occur FIM - Locomotion: Ambulation Locomotion: Ambulation Assistive Devices: Administrator Ambulation/Gait Assistance: 5: Supervision Locomotion: Ambulation: 5: Travels 150 ft or more with supervision/safety issues  Comprehension Comprehension Mode: Auditory Comprehension: 6-Follows complex conversation/direction: With extra time/assistive device  Expression Expression Mode: Verbal Expression: 6-Expresses complex ideas: With extra time/assistive device  Social Interaction Social Interaction: 5-Interacts appropriately 90% of the time - Needs monitoring or encouragement for participation or interaction.  Problem Solving Problem Solving: 4-Solves basic 75 - 89% of the time/requires cueing 10 - 24% of the time  Memory Memory: 3-Recognizes or recalls 50 - 74% of the time/requires cueing 25 - 49% of the time   Medical Problem List and Plan:  1. Functional deficits secondary to right PCA infarct 2. DVT Prophylaxis/Anticoagulation: Subcutaneous Lovenox. Monitor platelet counts and any signs of bleeding  3. Pain Management: Tylenol as needed, heat/stretching/strengthening for quads/proximal LE's  4. Neuropsych: This patient is capable of making decisions on his own  behalf.  5. History of peripheral neuropathy. Continue Cymbalta.  6. Hypertension. Lopressor. Monitor with increased mobility  7.  UTI, ecoli S completed abx 8.  Hyper K likely hemolysis resolved  9.  Bradycardia and irregular rhythm EKG showed no evidence of Afib.  + PAC and PVC, changed toprol XL to Lopressor, monitor clinically, HRs now in 52s and 70s A FACE TO FACE EVALUATION WAS PERFORMED  Castin Donaghue E 04/11/2014, 6:36 AM

## 2014-04-12 ENCOUNTER — Inpatient Hospital Stay (HOSPITAL_COMMUNITY): Payer: Medicare Other | Admitting: *Deleted

## 2014-04-12 ENCOUNTER — Inpatient Hospital Stay (HOSPITAL_COMMUNITY): Payer: Medicare Other | Admitting: Occupational Therapy

## 2014-04-12 ENCOUNTER — Encounter (HOSPITAL_COMMUNITY): Payer: Medicare Other

## 2014-04-12 ENCOUNTER — Inpatient Hospital Stay (HOSPITAL_COMMUNITY): Payer: Medicare Other | Admitting: Speech Pathology

## 2014-04-12 NOTE — Progress Notes (Signed)
Occupational Therapy Session Note  Patient Details  Name: Carl Rios MRN: [id number] Date of Birth: 1925-12-13  Today's Date: 04/12/2014 Time: 0900-0950 Time Calculation (min): 50 min  Short Term Goals: Week 1:  OT Short Term Goal 1 (Week 1): Pt will be able to stand to adjust clothing before and after toileting with supervision only. OT Short Term Goal 1 - Progress (Week 1): Met OT Short Term Goal 2 (Week 1): Pt will be able to transfer to toilet using RW with supervision only.   OT Short Term Goal 2 - Progress (Week 1): Met OT Short Term Goal 3 (Week 1): Pt will be able to stand in shower with close S demonstrating improved balance without posterior or L lean. OT Short Term Goal 3 - Progress (Week 1): Met OT Short Term Goal 4 (Week 1): Pt will be able to do room exercise program for LUE with min cues. OT Short Term Goal 4 - Progress (Week 1): Met Week 2:  OT Short Term Goal 1 (Week 2): STGs = LTGs       Skilled Therapeutic Interventions/Progress Updates:      Pt seen for BADL retraining of toileting, bathing, and dressing with a focus on dynamic balance, safety awareness, problem solving. Pt did very well this am with all tasks. He got out of bed, ambulated to his closet and drawers to retrieve his clothing, walked to toilet and then to shower with RW all with supervision. On toilet and in shower, he only required distant supervision (therapist stood outside [facility] door). No LOB with reaching out of his base of support to reach for a lotion bottle on a dresser.  Pt completed dressing after set up and then relaxed in his recliner. Pt resting with QRB, call light and phone in reach.    Therapy Documentation Precautions:  Precautions Precautions: Fall Precaution Comments: HOH Restrictions Weight Bearing Restrictions: No   Pain: Pain Assessment Pain Assessment: No/denies pain Pain Score: 0-No pain ADL: ADL ADL Comments: Refer to FIM  See FIM for current functional  status  Therapy/Group: Individual Therapy  SAGUIER,JULIA 04/12/2014, 10:54 AM

## 2014-04-12 NOTE — Progress Notes (Signed)
SLP Cancellation Note  Patient Details Name: Carl Rios MRN: [id number] DOB: 12-14-1925   Cancelled treatment:        Attempted to see patient for skilled speech therapy; however, patient declined to participate in therapy due to frequent bowel issues.  Per pt report, he had been given medication for his bowels which was upsetting his stomach.  Upon discussion with RN, pt had not in fact been given bowel medication but had just been cleaned from a bowel incontinence episode.  As a result, pt missed 45 minutes of skilled speech therapy.                                                                                            Jameison Haji, Selinda Orion 04/12/2014, 4:15 PM

## 2014-04-12 NOTE — Progress Notes (Signed)
Physical Therapy Session Note  Patient Details  Name: Carl Rios MRN: [id number] Date of Birth: [birth year]  Today's Date: 04/12/2014 Time: 5038-8828 Time Calculation (min): 59 min   Skilled Therapeutic Interventions/Progress Updates:  Tx focused on balance training, functional mobility, gait with RW, and stairs.  Family and home nursing present for end of tx to see what level of care he may need for eventual DC home.  Pt performed seated HS stretching with 1 verbal cue and handout, reminded of functional purpose of activity for flexibility and balance connection. Pt performed 3x30sec each leg.   Administered Berg Balance test to assess change, which improved from 13/56 to 32/56. This still indicates high risk of falls, but significant improvement nonetheless. Educated pt on findings, functional balance implications, and recommendations of using RW at ALL times.   Performed mobility in home setting x30' with S cues for RW management when approaching furniture, etc. And bathroom mobility with S as well, no cues needed.   Gait in controlled setting, up ramp to [employer]	[place] with cues for managing ramp and adjusting speed, S level including turns and managing obstacles. 1x250', 2x150' not limited by fatigue.   Performed 15 stairs with 2 rails and S, changing between reciprocal and step-to patterns.  Pt left up in recliner with lap belt and all needs in reach, family present with no questions.      Therapy Documentation Precautions:  Precautions Precautions: Fall Precaution Comments: HOH Restrictions Weight Bearing Restrictions: No General:   Vital Signs: Therapy Vitals Pulse Rate: 83 Oxygen Therapy SpO2: 97 % O2 Device: None (Room air) Pain: Pain Assessment Pain Assessment: No/denies pain Pain Score: 0-No pain Mobility: Transfers Transfers: Yes Stand Pivot Transfers: 5: Supervision Stand Pivot Transfer Details (indicate cue type and reason): Supervision cues needed for  safety due to balance Locomotion : Ambulation Ambulation: Yes Ambulation/Gait Assistance: 5: Supervision Ambulation Distance (Feet): 150 Feet Assistive device: Rolling walker Ambulation/Gait Assistance Details: Supervision due to safety needs for RW management and emergent awareness  Trunk/Postural Assessment : Cervical Assessment Cervical Assessment: Within Functional Limits Thoracic Assessment Thoracic Assessment: Within Functional Limits Lumbar Assessment Lumbar Assessment:  (posterior tilt) Postural Control Trunk Control: No longer sways without challenge. He is able to stand to doff clothing with one hand for UE support on wall.  Balance: Berg Balance Test Sit to Stand: Able to stand  independently using hands Standing Unsupported: Able to stand 2 minutes with supervision Sitting with Back Unsupported but Feet Supported on Floor or Stool: Able to sit safely and securely 2 minutes Stand to Sit: Controls descent by using hands Transfers: Able to transfer safely, definite need of hands Standing Unsupported with Eyes Closed: Able to stand 3 seconds Standing Ubsupported with Feet Together: Able to place feet together independently and stand for 1 minute with supervision From Standing, Reach Forward with Outstretched Arm: Can reach forward >12 cm safely (5") From Standing Position, Pick up Object from Floor: Able to pick up shoe, needs supervision From Standing Position, Turn to Look Behind Over each Shoulder: Turn sideways only but maintains balance Turn 360 Degrees: Needs close supervision or verbal cueing Standing Unsupported, Alternately Place Feet on Step/Stool: Needs assistance to keep from falling or unable to try Standing Unsupported, One Foot in Front: Needs help to step but can hold 15 seconds Standing on One Leg: Tries to lift leg/unable to hold 3 seconds but remains standing independently Total Score: 32 Static Sitting Balance Static Sitting - Level of Assistance: 7:  Independent  Dynamic Sitting Balance Dynamic Sitting - Level of Assistance: 7: Independent Static Standing Balance Static Standing - Level of Assistance: 5: Stand by assistance Dynamic Standing Balance Dynamic Standing - Level of Assistance: 5: Stand by assistance  See FIM for current functional status  Therapy/Group: Individual Therapy Kennieth Rad, PT, DPT  04/12/2014, 11:51 AM

## 2014-04-12 NOTE — Progress Notes (Signed)
Subjective/Complaints: 80 y.o. right-handed male history of peripheral neuropathy. Presented 03/26/2014 after a fall and remained down for approximately 12 hours until found by family. Patient independent prior to admission living alone and driving. Noted total CK of 1550, troponin negative, white blood cell count 14,100 and BUN 27. Cranial CT scan with no evidence of intracranial abnormality noted chronic lacunar infarct at the right thalamus. Patient did not receive TPA. The thoracic and lumbar spines negative for acute changes. Pelvis films without fracture. MRI of the brain revealed an acute right PCA infarct. Carotid Dopplers with no ICA stenosis. Echocardiogram with ejection fraction of 18% grade 1 diastolic dysfunction  Son is visiting today Discussed post hospital options and the difficulty at estamating exact duration of 24/7 supervision needs Review of Systems - neg except weakness Objective: Vital Signs: Blood pressure 149/76, pulse 78, temperature 97.7 F (36.5 C), temperature source Oral, resp. rate 18, height 5\' 8"  (1.727 m), weight 89.721 kg (197 lb 12.8 oz), SpO2 96.00%. No results found. No results found for this or any previous visit (from the past 72 hour(s)).   HEENT: normal Cardio: irreg, irreg and no murmurs Resp: CTA B/L and unlabored GI: BS positive and NT Extremity:  Pulses positive and No Edema Skin:   Intact Neuro: Alert/Oriented, Cranial Nerve II-XII normal, Normal Sensory LT RUE, and LUE, Reduce in LLE, tactile inattention LUE, Normal Motor and Abnormal FMC Ataxic/ dec FMC Left hemiataxia Musc/Skel:  Normal Gen NAD Stands for ~10 s with eyes closed wide base of support  Assessment/Plan: 1. Functional deficits secondary to R PCA infarct which require 3+ hours per day of interdisciplinary therapy in a comprehensive inpatient rehab setting. Physiatrist is providing close team supervision and 24 hour management of active medical problems listed below. Physiatrist  and rehab team continue to assess barriers to discharge/monitor patient progress toward functional and medical goals. As discussed with pt , will need 24/7 care which can be done in home with hired caregivers or in SNF, overall expect improvement in cognition over the next 1-2 mo FIM: FIM - Bathing Bathing Steps Patient Completed: Chest;Right Arm;Left Arm;Buttocks;Front perineal area;Abdomen;Right upper leg;Left upper leg;Right lower leg (including foot);Left lower leg (including foot) Bathing: 5: Supervision: Safety issues/verbal cues  FIM - Upper Body Dressing/Undressing Upper body dressing/undressing steps patient completed: Thread/unthread right sleeve of pullover shirt/dresss;Thread/unthread left sleeve of pullover shirt/dress;Put head through opening of pull over shirt/dress;Pull shirt over trunk Upper body dressing/undressing: 6: More than reasonable amount of time FIM - Lower Body Dressing/Undressing Lower body dressing/undressing steps patient completed: Thread/unthread right pants leg;Thread/unthread left pants leg;Pull pants up/down;Fasten/unfasten pants;Don/Doff right shoe;Don/Doff left shoe;Thread/unthread right underwear leg;Thread/unthread left underwear leg;Pull underwear up/down;Don/Doff right sock;Don/Doff left sock Lower body dressing/undressing: 5: Set-up assist to: Obtain clothing  FIM - Toileting Toileting steps completed by patient: Adjust clothing prior to toileting;Performs perineal hygiene;Adjust clothing after toileting Toileting Assistive Devices: Grab bar or rail for support Toileting: 5: Supervision: Safety issues/verbal cues  FIM - Radio producer Devices: Mining engineer Transfers: 5-To toilet/BSC: Supervision (verbal cues/safety issues);5-From toilet/BSC: Supervision (verbal cues/safety issues)  FIM - Engineer, site Assistive Devices: Arm rests;Walker Bed/Chair Transfer: 0: Activity did not  occur  FIM - Locomotion: Wheelchair Distance: 150 Locomotion: Wheelchair: 0: Activity did not occur FIM - Locomotion: Ambulation Locomotion: Ambulation Assistive Devices: Administrator Ambulation/Gait Assistance: 5: Supervision Locomotion: Ambulation: 5: Travels 150 ft or more with supervision/safety issues  Comprehension Comprehension Mode: Auditory Comprehension: 5-Follows basic conversation/direction: With extra  time/assistive device  Expression Expression Mode: Verbal Expression: 5-Expresses basic needs/ideas: With extra time/assistive device  Social Interaction Social Interaction: 5-Interacts appropriately 90% of the time - Needs monitoring or encouragement for participation or interaction.  Problem Solving Problem Solving: 4-Solves basic 75 - 89% of the time/requires cueing 10 - 24% of the time  Memory Memory: 3-Recognizes or recalls 50 - 74% of the time/requires cueing 25 - 49% of the time   Medical Problem List and Plan:  1. Functional deficits secondary to right PCA infarct 2. DVT Prophylaxis/Anticoagulation: Subcutaneous Lovenox. Monitor platelet counts and any signs of bleeding  3. Pain Management: Tylenol as needed, heat/stretching/strengthening for quads/proximal LE's  4. Neuropsych: This patient is capable of making decisions on his own behalf.  5. History of peripheral neuropathy. Continue Cymbalta.  6. Hypertension. Lopressor. Monitor with increased mobility  7.  UTI, ecoli S completed abx 8.  Hyper K likely hemolysis resolved  9.  Bradycardia and irregular rhythm EKG showed no evidence of Afib.  + PAC and PVC, changed toprol XL to Lopressor, monitor clinically, HRs now in 60s and 70s A FACE TO FACE EVALUATION WAS PERFORMED  Derald Lorge E 04/12/2014, 10:40 AM

## 2014-04-12 NOTE — Progress Notes (Addendum)
Physical Therapy Discharge Summary  Patient Details  Name: Carl Rios MRN: [id number] Date of Birth: 06-29-1926  Patient has met 7 of 8 long term goals due to improved activity tolerance, improved balance, improved postural control, increased strength, functional use of  left upper extremity and left lower extremity, improved awareness and improved coordination.  Patient to discharge at an ambulatory level Supervision.  Son trained in car transfer for trip to SNF.   SNF to provide the necessary cognitive assistance at discharge.  Reasons goals not met: Berg Balance goal of 37/56 not met due to ongoing balance impairments. Pt did have significant increase in score however, from 13/56 to 32/56.   Recommendation:  Patient will benefit from ongoing skilled PT services to continue to advance safe functional mobility, address ongoing impairments in L-sided strength, joint tightness, memory, awareness, and minimize fall risk. Marland Kitchen Physical therapists recommend PT in any setting, but feel long-term ALF setting will be safest situation overall in    Equipment: none  Reasons for discharge: treatment goals met and discharge from hospital  Patient/family agrees with progress made and goals achieved: Yes  PT Discharge Precautions/Restrictions- fall     Pain Pain Assessment Pain Assessment: No/denies pain Vision/Perception     Cognition Overall Cognitive Status: Impaired/Different from baseline Arousal/Alertness: Awake/alert Orientation Level: Oriented X4 Attention: Alternating Alternating Attention: Impaired  Per SLP :  Alternating Attention Impairment: Functional complex Memory: Impaired Memory Impairment: Retrieval deficit;Decreased recall of new information;Decreased short term memory Decreased Short Term Memory: Functional complex;Verbal complex Awareness: Impaired Awareness Impairment: Emergent impairment Problem Solving: Impaired Problem Solving Impairment: Functional  complex Executive Function: Organizing;Self Monitoring;Self Correcting Organizing: Impaired Organizing Impairment: Functional complex Self Monitoring: Impaired Self Monitoring Impairment: Functional complex Self Correcting: Impaired Self Correcting Impairment: Functional complex Behaviors: Impulsive Safety/Judgment: Impaired Comments: L-inattention, with improvements for impulsivity Sensation Sensation Light Touch: Appears Intact Stereognosis: Appears Intact Hot/Cold: Appears Intact Proprioception: Appears Intact Additional Comments: Pt reports longstanding tingling from neuropathy Coordination Gross Motor Movements are Fluid and Coordinated: Yes Fine Motor Movements are Fluid and Coordinated: Yes Motor  Motor Motor: Abnormal postural alignment and control Motor - Skilled Clinical Observations: Improved strength and postural control since eval. Pt with some motor perseveraioon upin initiationig movements, but nromalizes   Mobility Bed Mobility Bed Mobility:  (modified independent for all) Transfers Transfers: Yes Stand Pivot Transfers: 5: Supervision Stand Pivot Transfer Details: Verbal cues for precautions/safety Stand Pivot Transfer Details (indicate cue type and reason): hand placement, keeping RW with him during transitions to sitting Locomotion  Ambulation Ambulation: Yes Ambulation/Gait Assistance: 5: Supervision Ambulation Distance (Feet): 150 Feet Assistive device: Rolling walker Ambulation/Gait Assistance Details: Verbal cues for precautions/safety Ambulation/Gait Assistance Details: slightly impulsive when initiating gait Gait Gait: Yes Gait Pattern: Impaired Gait Pattern: Narrow base of support;Trunk rotated posteriorly on left;Trunk flexed;Decreased hip/knee flexion - right;Decreased hip/knee flexion - left High Level Ambulation High Level Ambulation: Backwards walking;Side stepping Side Stepping: with RW, S Backwards Walking: with RW, S> min assist Stairs  / Additional Locomotion Stairs: Yes Stairs Assistance: 5: Supervision Stairs Assistance Details: Verbal cues for precautions/safety Stairs Assistance Details (indicate cue type and reason): to ensure entire foot is on tread Stair Management Technique: Two rails;Alternating pattern Number of Stairs: 12 Ramp: 4: Min assist (with RW) Curb: 4: Min assist (with Rw) Wheelchair Mobility Wheelchair Mobility: No  Trunk/Postural Assessment  Cervical Assessment Cervical Assessment: Within Functional Limits Thoracic Assessment Thoracic Assessment: Within Functional Limits Lumbar Assessment Lumbar Assessment: Exceptions to [facility] (Decreased pelvic mobility, posterior  tilt) Postural Control Postural Control: Deficits on evaluation Trunk Control: L hip retracted in static and dynamic standing Righting Reactions: delayed Protective Responses: over-reliant on UEs but improved since admission  Balance Berg Balance Test Sit to Stand: Able to stand  independently using hands Standing Unsupported: Able to stand 2 minutes with supervision Sitting with Back Unsupported but Feet Supported on Floor or Stool: Able to sit safely and securely 2 minutes Stand to Sit: Controls descent by using hands Transfers: Able to transfer safely, definite need of hands Standing Unsupported with Eyes Closed: Able to stand 3 seconds Standing Ubsupported with Feet Together: Able to place feet together independently and stand for 1 minute with supervision From Standing, Reach Forward with Outstretched Arm: Can reach forward >12 cm safely (5") From Standing Position, Pick up Object from Floor: Able to pick up shoe, needs supervision From Standing Position, Turn to Look Behind Over each Shoulder: Turn sideways only but maintains balance Turn 360 Degrees: Needs close supervision or verbal cueing Standing Unsupported, Alternately Place Feet on Step/Stool: Needs assistance to keep from falling or unable to try Standing Unsupported,  One Foot in Front: Needs help to step but can hold 15 seconds Standing on One Leg: Tries to lift leg/unable to hold 3 seconds but remains standing independently Total Score: 32 Static Sitting Balance Static Sitting - Level of Assistance: 7: Independent Dynamic Sitting Balance Dynamic Sitting - Level of Assistance: 7: Independent Static Standing Balance Static Standing - Level of Assistance: 5: Stand by assistance Dynamic Standing Balance Dynamic Standing - Level of Assistance: 5: Stand by assistance;Other (comment) (demonstates spontaneous bil ankle strategies, delayed L hip strategy with external perturbations) Extremity Assessment      RLE Assessment RLE Assessment: Within Functional Limits LLE Assessment LLE Assessment: Exceptions to [facility] LLE Strength LLE Overall Strength Comments: 4/5 throughout, except 3+/5 knee flexion  See FIM for current functional status  Kennieth Rad, PT, DPT  04/15/2014, 5:32 PM

## 2014-04-12 NOTE — Progress Notes (Signed)
Occupational Therapy Discharge Summary  Patient Details  Name: Carl Rios MRN: [id number] Date of Birth: Nov 09, 1925  Today's Date: 04/15/2014    Patient has met 31 of 13 long term goals due to improved activity tolerance, improved balance, postural control, ability to compensate for deficits, functional use of  LEFT upper extremity, improved attention, improved awareness and improved coordination.  Patient to discharge at overall Supervision level.  Patient is being d/c to SNF for continued short term rehab to assist with increasing independence w/ BADL, IADL's prior to anticipated d/c home from there when able.  Reasons goals not met: n/a  Recommendation:  Patient will benefit from ongoing skilled OT services in skilled nursing facility setting to continue to advance functional skills in the area of BADL, iADL and Reduce care partner burden.  Equipment: No equipment provided  Reasons for discharge: treatment goals met  Patient/family agrees with progress made and goals achieved: Yes  OT Discharge ADL ADL ADL Comments: Refer to FIM Vision/Perception  Vision- History Patient Visual Report: No change from baseline Vision- Assessment Eye Alignment: Within Functional Limits  Cognition Orientation Level: Oriented X4 Memory: Impaired Memory Impairment: Prospective memory;Decreased recall of new information Awareness: Impaired Awareness Impairment: Emergent impairment Problem Solving: Impaired Problem Solving Impairment: Functional complex Sensation Sensation Light Touch: Appears Intact Stereognosis: Appears Intact Hot/Cold: Appears Intact Proprioception: Appears Intact Additional Comments: Pt reports longstanding tingling from neuropathy Coordination Fine Motor Movements are Fluid and Coordinated: Yes Finger Nose Finger Test: improved accuracy on the left. 8x in 10 seconds on both Left and Right Motor  Motor Motor: Abnormal postural alignment and control Motor -  Discharge Observations: Much improved postural alignment, pt is not longer leaning to left or posterior during ADLs. He continues to compensate by over extending his knees and flexing at his hips when reaching. Mobility    Refer to FIM Trunk/Postural Assessment  Cervical Assessment Cervical Assessment: Within Functional Limits Thoracic Assessment Thoracic Assessment: Within Functional Limits Lumbar Assessment Lumbar Assessment:  (posterior tilt) Postural Control Trunk Control: No longer sways without challenge. He is able to stand to doff clothing with one hand for UE support on wall.  Balance Static Sitting Balance Static Sitting - Level of Assistance: 7: Independent Dynamic Sitting Balance Dynamic Sitting - Level of Assistance: 7: Independent Static Standing Balance Static Standing - Level of Assistance: 5: Stand by assistance Dynamic Standing Balance Dynamic Standing - Level of Assistance: 5: Stand by assistance Extremity/Trunk Assessment RUE Assessment RUE Assessment: Within Functional Limits LUE Assessment LUE Assessment: Within Functional Limits LUE Strength LUE Overall Strength Comments: 30 lbs grip strength, same as R hand  See FIM for current functional status  SAGUIER,JULIA 04/12/2014, 9:30 AM

## 2014-04-12 NOTE — Progress Notes (Signed)
Occupational Therapy Note   Patient Details  Name: Carl Rios MRN: [id number] Date of Birth: 05-28-1926 Today's Date: 04/12/2014 Time:none Pain: none Individual session   Pt. Missed 60 minutes of group session due to diarrhea.    Lisa Roca 04/12/2014, 6:32 PM

## 2014-04-13 ENCOUNTER — Ambulatory Visit (HOSPITAL_COMMUNITY): Payer: Medicare Other

## 2014-04-13 NOTE — Progress Notes (Signed)
Physical Therapy Session Note  Patient Details  Name: Carl Rios MRN: [id number] Date of Birth: 22-Apr-1926  Today's Date: 04/13/2014 Time: 1400-1500 Time Calculation (min): 60 min   Skilled Therapeutic Interventions/Progress Updates:    Pt participated in walking group session, completing multiple gait trials at supervision level with RW. Pt completed sit to stand and static standing balance activities with focus on safety awareness. Pt completed negotiation of stairs with use of bilateral rails at supervision level.   Therapy Documentation Precautions:  Precautions Precautions: Fall Precaution Comments: HOH Restrictions Weight Bearing Restrictions: No General:   Vital S See FIM for current functional status  Therapy/Group: Individual Therapy  Travez Stancil R 04/13/2014, 4:33 PM

## 2014-04-13 NOTE — Progress Notes (Signed)
Subjective/Complaints: 80 y.o. right-handed male history of peripheral neuropathy. Presented 03/26/2014 after a fall and remained down for approximately 12 hours until found by family. Patient independent prior to admission living alone and driving. Noted total CK of 1550, troponin negative, white blood cell count 14,100 and BUN 27. Cranial CT scan with no evidence of intracranial abnormality noted chronic lacunar infarct at the right thalamus. Patient did not receive TPA. The thoracic and lumbar spines negative for acute changes. Pelvis films without fracture. MRI of the brain revealed an acute right PCA infarct. Carotid Dopplers with no ICA stenosis. Echocardiogram with ejection fraction of 16% grade 1 diastolic dysfunction No C/Os Review of Systems - neg except weakness Objective: Vital Signs: Blood pressure 142/87, pulse 75, temperature 97.5 F (36.4 C), temperature source Oral, resp. rate 18, height 5\' 8"  (1.727 m), weight 89.721 kg (197 lb 12.8 oz), SpO2 92.00%. No results found. No results found for this or any previous visit (from the past 72 hour(s)).   HEENT: normal Cardio: irreg, irreg and no murmurs Resp: CTA B/L and unlabored GI: BS positive and NT Extremity:  Pulses positive and No Edema Skin:   Intact Neuro: Alert/Oriented, Cranial Nerve II-XII normal, Normal Sensory LT RUE, and LUE, Reduce in LLE, tactile inattention LUE, Normal Motor and Abnormal FMC Ataxic/ dec FMC Left hemiataxia Musc/Skel:  Normal Gen NAD Stands for ~10 s with eyes closed wide base of support  Assessment/Plan: 1. Functional deficits secondary to R PCA infarct which require 3+ hours per day of interdisciplinary therapy in a comprehensive inpatient rehab setting. Physiatrist is providing close team supervision and 24 hour management of active medical problems listed below. Physiatrist and rehab team continue to assess barriers to discharge/monitor patient progress toward functional and medical goals. As  discussed with pt , will need 24/7 care which can be done in home with hired caregivers or in SNF, overall expect improvement in cognition over the next 1-2 mo FIM: FIM - Bathing Bathing Steps Patient Completed: Chest;Right Arm;Left Arm;Buttocks;Front perineal area;Abdomen;Right upper leg;Left upper leg;Right lower leg (including foot);Left lower leg (including foot) Bathing: 5: Supervision: Safety issues/verbal cues  FIM - Upper Body Dressing/Undressing Upper body dressing/undressing steps patient completed: Thread/unthread right sleeve of pullover shirt/dresss;Thread/unthread left sleeve of pullover shirt/dress;Put head through opening of pull over shirt/dress;Pull shirt over trunk Upper body dressing/undressing: 6: More than reasonable amount of time FIM - Lower Body Dressing/Undressing Lower body dressing/undressing steps patient completed: Thread/unthread right pants leg;Thread/unthread left pants leg;Pull pants up/down;Fasten/unfasten pants;Don/Doff right shoe;Don/Doff left shoe;Thread/unthread right underwear leg;Thread/unthread left underwear leg;Pull underwear up/down;Don/Doff right sock;Don/Doff left sock Lower body dressing/undressing: 5: Set-up assist to: Obtain clothing  FIM - Toileting Toileting steps completed by patient: Adjust clothing prior to toileting;Performs perineal hygiene;Adjust clothing after toileting Toileting Assistive Devices: Grab bar or rail for support Toileting: 5: Supervision: Safety issues/verbal cues  FIM - Radio producer Devices: Mining engineer Transfers: 5-To toilet/BSC: Supervision (verbal cues/safety issues);5-From toilet/BSC: Supervision (verbal cues/safety issues)  FIM - Engineer, site Assistive Devices: Arm rests;Walker Bed/Chair Transfer: 5: Supine > Sit: Supervision (verbal cues/safety issues);5: Sit > Supine: Supervision (verbal cues/safety issues)  FIM - Locomotion:  Wheelchair Distance: 150 Locomotion: Wheelchair: 0: Activity did not occur FIM - Locomotion: Ambulation Locomotion: Ambulation Assistive Devices: Administrator Ambulation/Gait Assistance: 5: Supervision Locomotion: Ambulation: 5: Travels 150 ft or more with supervision/safety issues  Comprehension Comprehension Mode: Auditory Comprehension: 5-Follows basic conversation/direction: With extra time/assistive device  Expression Expression Mode: Verbal Expression:  5-Expresses basic needs/ideas: With extra time/assistive device  Social Interaction Social Interaction: 5-Interacts appropriately 90% of the time - Needs monitoring or encouragement for participation or interaction.  Problem Solving Problem Solving: 4-Solves basic 75 - 89% of the time/requires cueing 10 - 24% of the time  Memory Memory: 3-Recognizes or recalls 50 - 74% of the time/requires cueing 25 - 49% of the time   Medical Problem List and Plan:  1. Functional deficits secondary to right PCA infarct 2. DVT Prophylaxis/Anticoagulation: Subcutaneous Lovenox. Monitor platelet counts and any signs of bleeding  3. Pain Management: Tylenol as needed, heat/stretching/strengthening for quads/proximal LE's  4. Neuropsych: This patient is capable of making decisions on his own behalf.  5. History of peripheral neuropathy. Continue Cymbalta.  6. Hypertension. Lopressor. Monitor with increased mobility  7.  UTI, ecoli S completed abx 8.  Hyper K likely hemolysis resolved  9.  Bradycardia and irregular rhythm EKG showed no evidence of Afib.  + PAC and PVC, changed toprol XL to Lopressor, monitor clinically, HRs now in 77s and 70s A FACE TO FACE EVALUATION WAS PERFORMED  KIRSTEINS,ANDREW E 04/13/2014, 10:28 AM

## 2014-04-14 NOTE — Progress Notes (Signed)
Subjective/Complaints: 80 y.o. right-handed male history of peripheral neuropathy. Presented 03/26/2014 after a fall and remained down for approximately 12 hours until found by family. Patient independent prior to admission living alone and driving. Noted total CK of 1550, troponin negative, white blood cell count 14,100 and BUN 27. Cranial CT scan with no evidence of intracranial abnormality noted chronic lacunar infarct at the right thalamus. Patient did not receive TPA. The thoracic and lumbar spines negative for acute changes. Pelvis films without fracture. MRI of the brain revealed an acute right PCA infarct. Carotid Dopplers with no ICA stenosis. Echocardiogram with ejection fraction of 29% grade 1 diastolic dysfunction No C/Os One son wants me to go to [facility] the other wants me to stay here. Pt prefers [place] Review of Systems - neg except weakness Objective: Vital Signs: Blood pressure 154/87, pulse 81, temperature 98 F (36.7 C), temperature source Oral, resp. rate 18, height 5\' 8"  (1.727 m), weight 89.721 kg (197 lb 12.8 oz), SpO2 92.00%. No results found. No results found for this or any previous visit (from the past 72 hour(s)).   HEENT: normal Cardio: irreg, irreg and no murmurs Resp: CTA B/L and unlabored GI: BS positive and NT Extremity:  Pulses positive and No Edema Skin:   Intact Neuro: Alert/Oriented, Cranial Nerve II-XII normal, Normal Sensory LT RUE, and LUE, Reduce in LLE, tactile inattention LUE, Normal Motor and Abnormal FMC Ataxic/ dec FMC Left hemiataxia Musc/Skel:  Normal Gen NAD Stands for ~10 s with eyes closed wide base of support  Assessment/Plan: 1. Functional deficits secondary to R PCA infarct which require 3+ hours per day of interdisciplinary therapy in a comprehensive inpatient rehab setting. Physiatrist is providing close team supervision and 24 hour management of active medical problems listed below. Physiatrist and rehab team continue to  assess barriers to discharge/monitor patient progress toward functional and medical goals.  FIM: FIM - Bathing Bathing Steps Patient Completed: Chest;Right Arm;Left Arm;Buttocks;Front perineal area;Abdomen;Right upper leg;Left upper leg;Right lower leg (including foot);Left lower leg (including foot) Bathing: 5: Supervision: Safety issues/verbal cues  FIM - Upper Body Dressing/Undressing Upper body dressing/undressing steps patient completed: Thread/unthread right sleeve of pullover shirt/dresss;Thread/unthread left sleeve of pullover shirt/dress;Put head through opening of pull over shirt/dress;Pull shirt over trunk Upper body dressing/undressing: 6: More than reasonable amount of time FIM - Lower Body Dressing/Undressing Lower body dressing/undressing steps patient completed: Thread/unthread right pants leg;Thread/unthread left pants leg;Pull pants up/down;Fasten/unfasten pants;Don/Doff right shoe;Don/Doff left shoe;Thread/unthread right underwear leg;Thread/unthread left underwear leg;Pull underwear up/down;Don/Doff right sock;Don/Doff left sock Lower body dressing/undressing: 5: Set-up assist to: Obtain clothing  FIM - Toileting Toileting steps completed by patient: Adjust clothing prior to toileting;Performs perineal hygiene;Adjust clothing after toileting Toileting Assistive Devices: Grab bar or rail for support Toileting: 5: Supervision: Safety issues/verbal cues  FIM - Radio producer Devices: Mining engineer Transfers: 5-To toilet/BSC: Supervision (verbal cues/safety issues);5-From toilet/BSC: Supervision (verbal cues/safety issues)  FIM - Control and instrumentation engineer Devices: Walker;Arm rests Bed/Chair Transfer: 5: Supine > Sit: Supervision (verbal cues/safety issues);5: Sit > Supine: Supervision (verbal cues/safety issues);5: Chair or W/C > Bed: Supervision (verbal cues/safety issues);5: Bed > Chair or W/C: Supervision (verbal  cues/safety issues)  FIM - Locomotion: Wheelchair Distance: 150 Locomotion: Wheelchair: 0: Activity did not occur FIM - Locomotion: Ambulation Locomotion: Ambulation Assistive Devices: Administrator Ambulation/Gait Assistance: 5: Supervision Locomotion: Ambulation: 5: Travels 150 ft or more with supervision/safety issues  Comprehension Comprehension Mode: Auditory Comprehension: 5-Follows basic conversation/direction: With extra time/assistive device  Expression Expression Mode: Verbal Expression: 5-Expresses basic needs/ideas: With extra time/assistive device  Social Interaction Social Interaction: 5-Interacts appropriately 90% of the time - Needs monitoring or encouragement for participation or interaction.  Problem Solving Problem Solving: 4-Solves basic 75 - 89% of the time/requires cueing 10 - 24% of the time  Memory Memory: 3-Recognizes or recalls 50 - 74% of the time/requires cueing 25 - 49% of the time   Medical Problem List and Plan:  1. Functional deficits secondary to right PCA infarct 2. DVT Prophylaxis/Anticoagulation: Subcutaneous Lovenox. Monitor platelet counts and any signs of bleeding  3. Pain Management: Tylenol as needed, heat/stretching/strengthening for quads/proximal LE's  4. Neuropsych: This patient is capable of making decisions on his own behalf.  5. History of peripheral neuropathy. Continue Cymbalta.  6. Hypertension. Lopressor. Monitor with increased mobility  7.  UTI, ecoli S completed abx 8.  Hyper K likely hemolysis resolved  9.  Bradycardia and irregular rhythm EKG showed no evidence of Afib.  + PAC and PVC, changed toprol XL to Lopressor, monitor clinically, HRs now in 71s and 70s A FACE TO FACE EVALUATION WAS PERFORMED  KIRSTEINS,ANDREW E 04/14/2014, 9:03 AM

## 2014-04-14 NOTE — Plan of Care (Signed)
Problem: RH BLADDER ELIMINATION Goal: RH STG MANAGE BLADDER WITH ASSISTANCE STG Manage Bladder With Min Assistance  Outcome: Progressing Pt is cont during day, prefers use of condom cath at [facility]

## 2014-04-15 ENCOUNTER — Inpatient Hospital Stay (HOSPITAL_COMMUNITY): Payer: Medicare Other | Admitting: Speech Pathology

## 2014-04-15 ENCOUNTER — Encounter (HOSPITAL_COMMUNITY): Payer: Medicare Other | Admitting: Occupational Therapy

## 2014-04-15 ENCOUNTER — Inpatient Hospital Stay (HOSPITAL_COMMUNITY): Payer: Medicare Other | Admitting: *Deleted

## 2014-04-15 DIAGNOSIS — I6350 Cerebral infarction due to unspecified occlusion or stenosis of unspecified cerebral artery: Secondary | ICD-10-CM | POA: Diagnosis not present

## 2014-04-15 DIAGNOSIS — F3289 Other specified depressive episodes: Secondary | ICD-10-CM | POA: Diagnosis not present

## 2014-04-15 DIAGNOSIS — G63 Polyneuropathy in diseases classified elsewhere: Secondary | ICD-10-CM | POA: Diagnosis not present

## 2014-04-15 DIAGNOSIS — K5732 Diverticulitis of large intestine without perforation or abscess without bleeding: Secondary | ICD-10-CM | POA: Diagnosis not present

## 2014-04-15 DIAGNOSIS — I739 Peripheral vascular disease, unspecified: Secondary | ICD-10-CM | POA: Diagnosis not present

## 2014-04-15 DIAGNOSIS — M545 Low back pain, unspecified: Secondary | ICD-10-CM | POA: Diagnosis not present

## 2014-04-15 DIAGNOSIS — R269 Unspecified abnormalities of gait and mobility: Secondary | ICD-10-CM | POA: Diagnosis not present

## 2014-04-15 DIAGNOSIS — G609 Hereditary and idiopathic neuropathy, unspecified: Secondary | ICD-10-CM | POA: Diagnosis not present

## 2014-04-15 DIAGNOSIS — I69998 Other sequelae following unspecified cerebrovascular disease: Secondary | ICD-10-CM | POA: Diagnosis not present

## 2014-04-15 DIAGNOSIS — I6330 Cerebral infarction due to thrombosis of unspecified cerebral artery: Secondary | ICD-10-CM | POA: Diagnosis not present

## 2014-04-15 DIAGNOSIS — K219 Gastro-esophageal reflux disease without esophagitis: Secondary | ICD-10-CM | POA: Diagnosis not present

## 2014-04-15 DIAGNOSIS — I714 Abdominal aortic aneurysm, without rupture, unspecified: Secondary | ICD-10-CM | POA: Diagnosis not present

## 2014-04-15 DIAGNOSIS — E785 Hyperlipidemia, unspecified: Secondary | ICD-10-CM | POA: Diagnosis not present

## 2014-04-15 DIAGNOSIS — M6281 Muscle weakness (generalized): Secondary | ICD-10-CM | POA: Diagnosis not present

## 2014-04-15 DIAGNOSIS — G608 Other hereditary and idiopathic neuropathies: Secondary | ICD-10-CM | POA: Diagnosis not present

## 2014-04-15 DIAGNOSIS — I10 Essential (primary) hypertension: Secondary | ICD-10-CM | POA: Diagnosis not present

## 2014-04-15 DIAGNOSIS — I69928 Other speech and language deficits following unspecified cerebrovascular disease: Secondary | ICD-10-CM | POA: Diagnosis not present

## 2014-04-15 DIAGNOSIS — M6282 Rhabdomyolysis: Secondary | ICD-10-CM | POA: Diagnosis not present

## 2014-04-15 DIAGNOSIS — R279 Unspecified lack of coordination: Secondary | ICD-10-CM | POA: Diagnosis not present

## 2014-04-15 DIAGNOSIS — F329 Major depressive disorder, single episode, unspecified: Secondary | ICD-10-CM | POA: Diagnosis not present

## 2014-04-15 DIAGNOSIS — E669 Obesity, unspecified: Secondary | ICD-10-CM | POA: Diagnosis not present

## 2014-04-15 LAB — CREATININE, SERUM
Creatinine, Ser: 0.74 mg/dL (ref 0.50–1.35)
GFR, EST NON AFRICAN AMERICAN: 81 mL/min — AB (ref >90)

## 2014-04-15 NOTE — Plan of Care (Signed)
Problem: RH Problem Solving Goal: LTG Patient will demonstrate problem solving for (SLP) LTG: Patient will demonstrate problem solving for basic/complex daily situations with cues (SLP)  Outcome: Not Met (add Reason) Inconsistent progress, pt at supervision level for basic problem solving; however, still requires min-mod cuing for complex  Problem: RH Memory Goal: LTG Patient will demonstrate ability for day to day (SLP) LTG: Patient will demonstrate ability for day to day recall/carryover during cognitive/linguistic activities with assist (SLP)  Outcome: Not Met (add Reason) Inconsistent progress  Problem: RH Awareness Goal: LTG: Patient will demonstrate intellectual/emergent (SLP) LTG: Patient will demonstrate intellectual/emergent/anticipatory awareness with assist during a cognitive/linguistic activity (SLP)  Outcome: Not Met (add Reason) Inconsistent progress

## 2014-04-15 NOTE — Discharge Summary (Signed)
Carl Rios, Carl Rios NO.:  [id number]  MEDICAL RECORD NO.:  [id number]  LOCATION:  4M06C                        FACILITY:  [facility]  PHYSICIAN:  Charlett Blake, M.D.DATE OF BIRTH:  [birth year]  DATE OF ADMISSION:  04/01/2014 DATE OF DISCHARGE:  04/15/2014                              DISCHARGE SUMMARY   DISCHARGE DIAGNOSES: 1. Functional deficits secondary to right posterior communicating     artery infarct with rhabdomyolysis. 2. Subcutaneous Lovenox for deep vein thrombosis prophylaxis. 3. Pain management. 4. History of peripheral neuropathy. 5. Hypertension.  HISTORY OF PRESENT ILLNESS:  This is an 80 year old right-handed male with history of peripheral neuropathy, who presented Mar 26, 2014, after a fall, remained down for approximately 12 hours until found by his family.  He was independent prior to admission, living alone and driving.  Noted total CK of 1550, troponin negative.  BUN 27.  Cranial CT scan with no evidence of intracranial abnormality.  The patient did not receive tPA.  Thoracic lumbar spine films negative for acute fractures.  MRI of the brain revealed an acute right PCA infarct. Carotid Dopplers with no ICA stenosis.  Echocardiogram with ejection fraction of 60% and grade 1 diastolic dysfunction.  Maintained on aspirin for CVA prophylaxis.  The patient with gentle IV fluids.  Total CK improved to 63.  Subcutaneous Lovenox added for DVT prophylaxis.  The patient maintained on mechanical soft diet.  Physical and occupational therapy ongoing.  The patient was admitted for a comprehensive rehab program.  PAST MEDICAL HISTORY:  See discharge diagnoses.  SOCIAL HISTORY:  Lives alone.  FUNCTIONAL HISTORY PRIOR TO ADMISSION:  Independent driving.  FUNCTIONAL STATUS UPON ADMISSION TO REHAB SERVICES:  Minimal guard sit to stand, moderate assist stand pivot transfers, moderate assist supine to sit.  PHYSICAL EXAMINATION:  VITAL SIGNS:   Blood pressure 144/91, pulse 129, temperature 98, respirations 18. GENERAL:  This was an alert male, hard of hearing.  Mild left facial droop. LUNGS:  Clear to auscultation. CARDIAC:  Regular rate and rhythm. ABDOMEN:  Soft, nontender.  Good bowel sounds. EXTREMITIES:  He had an abrasion on his left leg.  REHABILITATION HOSPITAL COURSE:  The patient was admitted to Inpatient Rehab Services with therapies initiated on a 3-hour daily basis consisting of physical therapy, occupational therapy, speech therapy, and rehabilitation nursing.  The following issues were addressed during the patient's rehabilitation stay.  Pertaining to Mr. Aro functional deficits secondary to right PCA infarct remained stable, he continued on aspirin therapy.  Subcutaneous Lovenox for DVT prophylaxis, no bleeding episodes.  He did have a history of peripheral neuropathy, maintained on Cymbalta.  Blood pressures controlled with Lopressor with no orthostatic changes.  He did complete a course of antibiotics for E. coli urinary tract infection prior to admission to rehab with no dysuria or hematuria.  No other bowel or bladder disturbances.  The patient received weekly collaborative interdisciplinary team conferences to discuss estimated length of stay, family teaching, and any barriers to discharge.  He was ambulating supervision level with a rolling walker. Completed sit to stand static, standing balance activities with supervision.  Negotiating stairs with hand rails with supervision level. He was able  to gather his belongings for activities of daily living, including toileting, bathing, and dressing.  He was able to get out of bed, ambulate to his closet to gather his belongings.    It was advised no driving at the time of discharge. Recommendations were for ongoing supervision for patient safety. Family could not provide the necessary assistance at home plan was for skilled nursing facility with bed becoming  available 04/15/2014  DISCHARGE MEDICATIONS: 1. Aspirin 81 mg p.o. daily. 2. Cymbalta 30 mg p.o. daily. 3. Lopressor 12.5 mg p.o. b.i.d..  DIET:  Regular.  SPECIAL INSTRUCTIONS:  The patient would follow up Dr. Alysia Penna at the outpatient rehab center as directed on 06/10/2014  11 am; Dr. Marton Redwood, medical management.     Lauraine Rinne, P.A.   ______________________________ Charlett Blake, M.D.    DA/MEDQ  D:  04/15/2014  T:  04/15/2014  Job:  [id number]  cc:   Janalyn Rouse, M.D.

## 2014-04-15 NOTE — Progress Notes (Signed)
Speech Language Pathology Discharge Summary  Patient Details  Name: Carl Rios MRN: [id number] Date of Birth: Jun 02, 1926  Today's Date: 04/15/2014 Time: 1030-1100 Time Calculation (min): 30 min  Skilled Therapeutic Interventions:  Pt was seen for skilled speech therapy targeting pt and family education prior to discharge. Upon arrival, pt recalled that he had requested to cancel speech therapy session on Friday with min question cuing.   When engaged in functional conversations, pt also required min assist verbal cues to identify at least 2 cognitive or physical impairments to target as goals of therapy at the next level of care.  Pt presented with questions related to therapy available at SNF, therefore SLP reiterated recommendations for follow up speech therapy which will be available at next venue.  SLP also reviewed and reinforced education related to sequelae of stroke recovery and ongoing recommendation for supervision for safety awareness/insight as well as assistance for medication and financial management.     Patient has met 4 of 7 long term goals.  Patient to discharge at overall Supervision;Min level.  Reasons goals not met: inconsistent progress   Clinical Impression/Discharge Summary:  Patient has made functional gains and has met 4 of 7 long term goals this admission due to improved mastication of regular solids, recall of daily information with the use of external aids, and safety awareness. Patient is currently an overall supervision- min assist level for cognitive tasks and is modified independent for utilization of swallowing compensatory strategies to minimize overt s/s of aspiration with regular solids and thin liquids. Patient and family education is complete and patient will discharge to skilled nursing facility.  Patient would benefit from follow up SLP services to maximize emergent awareness, recall of complex information, and problem solving for semi-complex tasks in order  to maximize his functional independence.  Care Partner:  Caregiver Able to Provide Assistance: Other (comment) (SNF)    Recommendation:  24 hour supervision/assistance;Skilled Nursing facility;Other (comment)  Rationale for SLP Follow Up: Maximize cognitive function and independence;Reduce caregiver burden   Equipment: none recommended by SLP  Reasons for discharge: Discharged from hospital   Patient/Family Agrees with Progress Made and Goals Achieved: Yes   See FIM for current functional status  [name], M.A. CCC-SLP   Torien Ramroop, Selinda Orion 04/15/2014, 2:54 PM

## 2014-04-15 NOTE — Progress Notes (Signed)
Social Work  (late entry)   Patient ID: Carl Rios, male   DOB: 19-Jul-1926, 80 y.o.   MRN: [id number]  Spoke with pt and sons on Friday several times to determine d/c plan.  Son, Rod, reports that family has decided to pursue SNF placement in [place].  Had received bed offers from several facilities on Friday with pt and family accepting offer from [facility]  Plan to transfer to facility today once paperwork is completed.(early afternoon).  Have discussed with Marlowe Shores, PA.    Lennart Pall, LCSW

## 2014-04-15 NOTE — Progress Notes (Signed)
Subjective/Complaints: 80 y.o. right-handed male history of peripheral neuropathy. Presented 03/26/2014 after a fall and remained down for approximately 12 hours until found by family. Patient independent prior to admission living alone and driving. Noted total CK of 1550, troponin negative, white blood cell count 14,100 and BUN 27. Cranial CT scan with no evidence of intracranial abnormality noted chronic lacunar infarct at the right thalamus. Patient did not receive TPA. The thoracic and lumbar spines negative for acute changes. Pelvis films without fracture. MRI of the brain revealed an acute right PCA infarct. Carotid Dopplers with no ICA stenosis. Echocardiogram with ejection fraction of 41% grade 1 diastolic dysfunction  Feeling well. Ready to go to SNF today.  Review of Systems - neg except weakness   Objective: Vital Signs: Blood pressure 133/86, pulse 80, temperature 97.9 F (36.6 C), temperature source Oral, resp. rate 18, height 5' 8"  (1.727 m), weight 89.721 kg (197 lb 12.8 oz), SpO2 94.00%. No results found. Results for orders placed during the hospital encounter of 04/01/14 (from the past 72 hour(s))  CREATININE, SERUM     Status: Abnormal   Collection Time    04/15/14  7:45 AM      Result Value Ref Range   Creatinine, Ser 0.74  0.50 - 1.35 mg/dL   GFR calc non Af Amer 81 (*) >90 mL/min   GFR calc Af Amer >90  >90 mL/min   Comment: (NOTE)     The eGFR has been calculated using the CKD EPI equation.     This calculation has not been validated in all clinical situations.     eGFR's persistently <90 mL/min signify possible Chronic Kidney     Disease.     HEENT: normal Cardio: irreg, irreg and no murmurs Resp: CTA B/L and unlabored GI: BS positive and NT Extremity:  Pulses positive and No Edema Skin:   Intact Neuro: Alert/Oriented, Cranial Nerve II-XII normal, Normal Sensory LT RUE, and LUE, Reduce in LLE, tactile inattention LUE, Normal Motor and Abnormal FMC Ataxic/ dec  FMC Left hemiataxia Musc/Skel:  Normal Gen NAD   Assessment/Plan: 1. Functional deficits secondary to R PCA infarct which require 3+ hours per day of interdisciplinary therapy in a comprehensive inpatient rehab setting. Physiatrist is providing close team supervision and 24 hour management of active medical problems listed below. Physiatrist and rehab team continue to assess barriers to discharge/monitor patient progress toward functional and medical goals.  To SNF today.    FIM: FIM - Bathing Bathing Steps Patient Completed: Chest;Right Arm;Left Arm;Buttocks;Front perineal area Bathing: 5: Supervision: Safety issues/verbal cues  FIM - Upper Body Dressing/Undressing Upper body dressing/undressing steps patient completed: Thread/unthread right sleeve of pullover shirt/dresss;Thread/unthread left sleeve of pullover shirt/dress;Put head through opening of pull over shirt/dress;Pull shirt over trunk Upper body dressing/undressing: 6: More than reasonable amount of time FIM - Lower Body Dressing/Undressing Lower body dressing/undressing steps patient completed: Thread/unthread right pants leg;Thread/unthread left pants leg;Pull pants up/down;Fasten/unfasten pants;Don/Doff right shoe;Don/Doff left shoe;Thread/unthread right underwear leg;Thread/unthread left underwear leg;Pull underwear up/down;Don/Doff right sock;Don/Doff left sock Lower body dressing/undressing: 5: Set-up assist to: Obtain clothing  FIM - Toileting Toileting steps completed by patient: Adjust clothing prior to toileting;Performs perineal hygiene;Adjust clothing after toileting Toileting Assistive Devices: Grab bar or rail for support Toileting: 5: Supervision: Safety issues/verbal cues  FIM - Radio producer Devices: Mining engineer Transfers: 5-To toilet/BSC: Supervision (verbal cues/safety issues);5-From toilet/BSC: Supervision (verbal cues/safety issues)  FIM - Financial trader Devices: Walker;Arm rests  Bed/Chair Transfer: 5: Supine > Sit: Supervision (verbal cues/safety issues);5: Sit > Supine: Supervision (verbal cues/safety issues);5: Chair or W/C > Bed: Supervision (verbal cues/safety issues);5: Bed > Chair or W/C: Supervision (verbal cues/safety issues)  FIM - Locomotion: Wheelchair Distance: 150 Locomotion: Wheelchair: 0: Activity did not occur FIM - Locomotion: Ambulation Locomotion: Ambulation Assistive Devices: Administrator Ambulation/Gait Assistance: 5: Supervision Locomotion: Ambulation: 5: Travels 150 ft or more with supervision/safety issues  Comprehension Comprehension Mode: Auditory Comprehension: 6-Follows complex conversation/direction: With extra time/assistive device  Expression Expression Mode: Verbal Expression: 5-Expresses basic needs/ideas: With extra time/assistive device  Social Interaction Social Interaction: 5-Interacts appropriately 90% of the time - Needs monitoring or encouragement for participation or interaction.  Problem Solving Problem Solving: 4-Solves basic 75 - 89% of the time/requires cueing 10 - 24% of the time  Memory Memory: 3-Recognizes or recalls 50 - 74% of the time/requires cueing 25 - 49% of the time   Medical Problem List and Plan:  1. Functional deficits secondary to right PCA infarct 2. DVT Prophylaxis/Anticoagulation: Subcutaneous Lovenox. Monitor platelet counts and any signs of bleeding  3. Pain Management: Tylenol as needed, heat/stretching/strengthening for quads/proximal LE's  4. Neuropsych: This patient is capable of making decisions on his own behalf.  5. History of peripheral neuropathy. Continue Cymbalta.  6. Hypertension. Lopressor. outpt adjustment  7.  UTI, ecoli S completed abx 8.  Hyper K likely hemolysis resolved  9.  Bradycardia and irregular rhythm EKG showed no evidence of Afib.  + PAC and PVC, changed toprol XL to Lopressor, monitor  clinically, HRs now in 60s and 70s A FACE TO FACE EVALUATION WAS PERFORMED  SWARTZ,ZACHARY T 04/15/2014, 9:06 AM

## 2014-04-15 NOTE — Progress Notes (Signed)
Social Work Discharge Note Discharge Note  The overall goal for the admission was met for:   Discharge location: No-[facility]  Length of Stay: Yes-14 DAYS  Discharge activity level: Yes-SUPERVISION/MIN LEVEL  Home/community participation: Yes  Services provided included: MD, RD, PT, OT, SLP, RN, CM, TR, Pharmacy and SW  Financial Services: Medicare and Private Insurance: [place]  Follow-up services arranged: Other: SHORT TERM NHP  Comments (or additional information):FEEL NHP FOR MORE REHAB THEN TO ALF OR HOME WITH HIRED ASSIST  Patient/Family verbalized understanding of follow-up arrangements: Yes  Individual responsible for coordination of the follow-up plan: ANDY & ROD-SONS  Confirmed correct DME delivered: Elease Hashimoto 04/15/2014    Elease Hashimoto

## 2014-04-15 NOTE — Discharge Instructions (Signed)
Inpatient Rehab Discharge Instructions  Carl Rios Discharge date and time: No discharge date for patient encounter.   Activities/Precautions/ Functional Status: Activity: activity as tolerated Diet: regular diet Wound Care: none needed Functional status:  ___ No restrictions     ___ Walk up steps independently ___ 24/7 supervision/assistance   ___ Walk up steps with assistance ___ Intermittent supervision/assistance  ___ Bathe/dress independently ___ Walk with walker     ___ Bathe/dress with assistance ___ Walk Independently    ___ Shower independently _x STROKE/TIA DISCHARGE INSTRUCTIONS SMOKING Cigarette smoking nearly doubles your risk of having a stroke & is the single most alterable risk factor  If you smoke or have smoked in the last 12 months, you are advised to quit smoking for your health.  Most of the excess cardiovascular risk related to smoking disappears within a year of stopping.  Ask you doctor about anti-smoking medications  [place] Quit Line: 1-800-QUIT NOW  Free Smoking Cessation Classes [phone]  CHOLESTEROL Know your levels; limit fat & cholesterol in your diet  Lipid Panel     Component Value Date/Time   CHOL 164 03/30/2014 0540   TRIG 116 03/30/2014 0540   HDL 34* 03/30/2014 0540   CHOLHDL 4.8 03/30/2014 0540   VLDL 23 03/30/2014 0540   LDLCALC 107* 03/30/2014 0540      Many patients benefit from treatment even if their cholesterol is at goal.  Goal: Total Cholesterol (CHOL) less than 160  Goal:  Triglycerides (TRIG) less than 150  Goal:  HDL greater than 40  Goal:  LDL (LDLCALC) less than 100   BLOOD PRESSURE American Stroke Association blood pressure target is less that 120/80 mm/Hg  Your discharge blood pressure is:  BP: 111/69 mmHg  Monitor your blood pressure  Limit your salt and alcohol intake  Many individuals will require more than one medication for high blood pressure  DIABETES (A1c is a blood sugar average for last 3 months) Goal  HGBA1c is under 7% (HBGA1c is blood sugar average for last 3 months)  Diabetes: No known diagnosis of diabetes    No results found for this basename: HGBA1C     Your HGBA1c can be lowered with medications, healthy diet, and exercise.  Check your blood sugar as directed by your physician  Call your physician if you experience unexplained or low blood sugars.  PHYSICAL ACTIVITY/REHABILITATION Goal is 30 minutes at least 4 days per week  Activity: Increase activity slowly, Therapies: Physical Therapy: Home Health Return to work:   Activity decreases your risk of heart attack and stroke and makes your heart stronger.  It helps control your weight and blood pressure; helps you relax and can improve your mood.  Participate in a regular exercise program.  Talk with your doctor about the best form of exercise for you (dancing, walking, swimming, cycling).  DIET/WEIGHT Goal is to maintain a healthy weight  Your discharge diet is: General  liquids Your height is:  Height: 5\' 8"  (172.7 cm) Your current weight is: Weight: 89.721 kg (197 lb 12.8 oz) Your Body Mass Index (BMI) is:  BMI (Calculated): 31.2  Following the type of diet specifically designed for you will help prevent another stroke.  Your goal weight range is:    Your goal Body Mass Index (BMI) is 19-24.  Healthy food habits can help reduce 3 risk factors for stroke:  High cholesterol, hypertension, and excess weight.  RESOURCES Stroke/Support Group:  Call [facility] [place]  TO PATIENT Stroke warning signs and symptoms How to activate emergency medical system (call 911). Medications prescribed at discharge. Need for follow-up after discharge. Personal risk factors for stroke. Pneumonia vaccine given:  Flu vaccine given:  My questions have been answered, the writing is legible, and I understand these instructions.  I will adhere to these goals & educational materials that have been  provided to me after my discharge from the hospital.    __ Walk with assistance    ___ Shower with assistance ___ No alcohol     ___ Return to work/school ________  Special Instructions:    My questions have been answered and I understand these instructions. I will adhere to these goals and the provided educational materials after my discharge from the hospital.  Patient/Caregiver Signature _______________________________ Date __________  Clinician Signature _______________________________________ Date __________  Please bring this form and your medication list with you to all your follow-up doctor's appointments.

## 2014-04-15 NOTE — Progress Notes (Signed)
Pt discharged to SNF. Discharge instructions included in discharge packet. Pt transported to facility by son via private transportat. Escorted off unit in w/c with personal belonging. Report called to [facility], receiving facility.

## 2014-04-15 NOTE — Discharge Summary (Signed)
Discharge summary job [id number]

## 2014-04-15 NOTE — Progress Notes (Addendum)
Occupational Therapy Session Note  Patient Details  Name: Carl Rios MRN: [id number] Date of Birth: [birth year]  Today's Date: 04/15/2014 Time: 9971-8209 Time Calculation (min): 53 min  Short Term Goals: Week 2:  OT Short Term Goal 1 (Week 2): STGs = LTGs   Skilled Therapeutic Interventions/Progress Updates:    Pt was seen this morning for individual OT treatment session for ADL retraining at shower level  (grooming, bathing, dressing), focused on functional mobility and transfers, carry over related to bathing and dressing techniques. Pt is overall supervision level and has met 13/13 goals. He benefits from continued vc's for safety (ie. Keep RW w/ him during transfers etc). He transferred into/out of shower using RW, grab bars and sitting on tub bench at supervision level. He was also supervision for toilet transfers and sit to stand during grooming and LB dressing at sink. Pt plans to d/c to SNF Rehab later today, states that his son will be in later. Pt sitting up in w/c w/ QRB on, call bell and phone in reach.   Therapy Documentation Precautions:  Precautions Precautions: Fall Precaution Comments: HOH Restrictions Weight Bearing Restrictions: No General:   Vital Signs: Therapy Vitals Pulse Rate: 80 BP: 133/86 mmHg Pain: Pain Assessment Pain Score: 0-No pain ADL: ADL ADL Comments: Refer to FIM     See FIM for current functional status  Therapy/Group: Individual Therapy  Almyra Deforest 04/15/2014, 10:25 AM

## 2014-04-16 ENCOUNTER — Non-Acute Institutional Stay (SKILLED_NURSING_FACILITY): Payer: Medicare Other | Admitting: Internal Medicine

## 2014-04-16 DIAGNOSIS — I639 Cerebral infarction, unspecified: Secondary | ICD-10-CM

## 2014-04-16 DIAGNOSIS — F329 Major depressive disorder, single episode, unspecified: Secondary | ICD-10-CM

## 2014-04-16 DIAGNOSIS — I10 Essential (primary) hypertension: Secondary | ICD-10-CM | POA: Diagnosis not present

## 2014-04-16 DIAGNOSIS — F3289 Other specified depressive episodes: Secondary | ICD-10-CM

## 2014-04-16 DIAGNOSIS — G609 Hereditary and idiopathic neuropathy, unspecified: Secondary | ICD-10-CM | POA: Diagnosis not present

## 2014-04-16 DIAGNOSIS — I6350 Cerebral infarction due to unspecified occlusion or stenosis of unspecified cerebral artery: Secondary | ICD-10-CM | POA: Diagnosis not present

## 2014-04-17 DIAGNOSIS — F32A Depression, unspecified: Secondary | ICD-10-CM | POA: Insufficient documentation

## 2014-04-17 DIAGNOSIS — F329 Major depressive disorder, single episode, unspecified: Secondary | ICD-10-CM | POA: Insufficient documentation

## 2014-04-17 NOTE — Progress Notes (Signed)
HISTORY & PHYSICAL  DATE: 04/16/2014   FACILITY: [facility]  LEVEL OF CARE: SNF (31)  ALLERGIES:  Allergies  Allergen Reactions  . Sulfamethoxazole Other (See Comments)    UNKNOWN    CHIEF COMPLAINT:  Manage CVA, hypertension and peripheral neuropathy  HISTORY OF PRESENT ILLNESS: Patient is an 80 year old Caucasian male who was admitted to this facility for short-term rehabilitation after this recent stroke.  CVA: The patient's CVA remains stable.  Patient denies new neurologic symptoms such as numbness, tingling, weakness, speech difficulties or visual disturbances.  No complications reported from the medications currently being used. Patient had an acute right PCA infarct. He underwent comprehensive inpatient rehabilitation prior to admission here.  HTN: Pt 's HTN remains stable.  Denies CP, sob, DOE, pedal edema, headaches, dizziness or visual disturbances.  No complications from the medications currently being used.  Last BP : 125/77.  PERIPHERAL NEUROPATHY: The peripheral neuropathy is stable. The patient denies pain in the feet, tingling, and numbness. No complications noted from the medication presently being used.  PAST MEDICAL HISTORY :  Past Medical History  Diagnosis Date  . AAA (abdominal aortic aneurysm) without rupture   . Hyperlipidemia   . Diverticulosis   . GERD (gastroesophageal reflux disease)   . Hx of adenomatous colonic polyps   . Peripheral vascular disease   . Obesity   . Dyslipidemia   . History of colon polyps   . History of prostatitis   . Hearing difficulty   . Peripheral neuropathy     PAST SURGICAL HISTORY: Past Surgical History  Procedure Laterality Date  . Knee surgery      left knee in 1970's  . Colonoscopy  01/24/2012    Procedure: COLONOSCOPY;  Surgeon: Lafayette Dragon, MD;  Location: [facility];  Service: Endoscopy;  Laterality: N/A;  . Inguinal hernia repair    . Cataract extraction Bilateral   .  Tonsillectomy    . Abdominal aortic endovascular stent graft N/A 08/23/2013    Procedure: ABDOMINAL AORTIC ENDOVASCULAR STENT GRAFT;  Surgeon: Mal Misty, MD;  Location: [place];  Service: Vascular;  Laterality: N/A;  . Abdominal aortic aneurysm repair      SOCIAL HISTORY:  reports that he quit smoking about 55 years ago. He has never used smokeless tobacco. He reports that he does not drink alcohol or use illicit drugs.  FAMILY HISTORY:  Family History  Problem Relation Age of Onset  . Heart disease Father 70    Heart diease before age 75    CURRENT MEDICATIONS: Reviewed per MAR/see medication list  REVIEW OF SYSTEMS:  See HPI otherwise 14 point ROS is negative.  PHYSICAL EXAMINATION  VS:  See VS section  GENERAL: no acute distress, moderately obese body habitus EYES: conjunctivae normal, sclerae normal, normal eye lids MOUTH/THROAT: lips without lesions,no lesions in the mouth,tongue is without lesions,uvula elevates in midline NECK: supple, trachea midline, no neck masses, no thyroid tenderness, no thyromegaly LYMPHATICS: no LAN in the neck, no supraclavicular LAN RESPIRATORY: breathing is even & unlabored, BS CTAB CARDIAC: RRR, no murmur,no extra heart sounds, no edema GI:  ABDOMEN: abdomen soft, normal BS, no masses, no tenderness  LIVER/SPLEEN: no hepatomegaly, no splenomegaly MUSCULOSKELETAL: HEAD: normal to inspection  EXTREMITIES: LEFT UPPER EXTREMITY: full range of motion, normal strength & tone RIGHT UPPER EXTREMITY:  full range of motion, normal strength & tone LEFT LOWER EXTREMITY:   range of motion moderate, normal strength &  tone RIGHT LOWER EXTREMITY:   range of motion moderate, normal strength & tone PSYCHIATRIC: the patient is alert & oriented to person, affect & behavior appropriate  LABS/RADIOLOGY:  Labs reviewed: Basic Metabolic Panel:  Recent Labs  08/21/13 1349 08/23/13 1120  04/01/14 0545 04/02/14 0700 04/08/14 0347 04/08/14 0730  04/15/14 0745  NA 136 137  < > 139 139  --  138  --   K 4.2 3.8  < > 3.9 5.3  --  4.4  --   CL 102 103  < > 101 102  --  101  --   CO2 19 22  < > 25 26  --  25  --   GLUCOSE 103* 172*  < > 108* 113*  --  104*  --   BUN 18 19  < > 20 18  --  15  --   CREATININE 0.83 0.76  < > 0.85 0.87 0.91 0.88 0.74  CALCIUM 9.1 8.3*  < > 9.2 9.3  --  9.2  --   MG  --  2.0  --   --   --   --   --   --   < > = values in this interval not displayed. Liver Function Tests:  Recent Labs  08/21/13 1349 03/27/14 0545 04/02/14 0700  AST 21 38* 32  ALT 17 22 26   ALKPHOS 80 84 87  BILITOT 0.4 1.2 0.7  PROT 6.7 6.7 6.3  ALBUMIN 3.8 3.6 3.2*   CBC:  Recent Labs  03/31/14 0612 04/01/14 0545 04/02/14 0700  WBC 7.6 8.9 9.0  NEUTROABS  --   --  6.5  HGB 14.5 14.6 14.1  HCT 42.8 45.0 42.7  MCV 88.8 90.7 90.3  PLT 259 302 294   Lipid Panel:  Recent Labs  03/30/14 0540  HDL 34*   Cardiac Enzymes:  Recent Labs  03/26/14 2125  03/30/14 0540 03/31/14 0612 04/01/14 0545  CKTOTAL 1550*  < > 168 93 63  TROPONINI <0.30  --   --   --   --   < > = values in this interval not displayed.  CBG:  Recent Labs  03/26/14 2112  GLUCAP 133*    Transthoracic Echocardiography  Patient:    Parrish, Bonn MR #:       [id number] Study Date: 03/30/2014 Gender:     M Age:        16 Height:     172.7 cm Weight:     94.8 kg BSA:        2.16 m^2 Pt. Status: Room:       [place] [id number]  [place], [place], [place]  SONOGRAPHER  Jimmy Reel, RDCS  PERFORMING   [facility], Inpatient  cc:  ------------------------------------------------------------------- LV EF: 55% -   60%  ------------------------------------------------------------------- Indications:      CVA 18.  ------------------------------------------------------------------- History:   Risk factors:   Hypertension.  ------------------------------------------------------------------- Study Conclusions  - Left ventricle: The cavity size was normal. Wall thickness was   normal. Systolic function was normal. The estimated ejection   fraction was in the range of 55% to 60%. Doppler parameters are   consistent with abnormal left ventricular relaxation (grade 1   diastolic dysfunction). - Mitral valve: There was mild regurgitation.  -------------------------------------------------------------------  ------------------------------------------------------------------- Left ventricle:  LVEF Is 55 to 60%  with mild inferior hypokinesis. The cavity size was normal. Wall thickness was normal. Systolic function was normal. The estimated ejection fraction was in the range of 55% to 60%. Doppler parameters are consistent with abnormal left ventricular relaxation (grade 1 diastolic dysfunction).  ------------------------------------------------------------------- Aortic valve:   Mildly thickened, mildly calcified leaflets. Doppler:  There was no significant regurgitation.  ------------------------------------------------------------------- Mitral valve:   Structurally normal valve.   Leaflet separation was normal.  Doppler:  Transvalvular velocity was within the normal range. There was no evidence for stenosis. There was mild regurgitation.  ------------------------------------------------------------------- Left atrium:  The atrium was normal in size.  ------------------------------------------------------------------- Right ventricle:  The cavity size was normal. Wall thickness was normal. Systolic function was normal.  ------------------------------------------------------------------- Tricuspid valve:   Structurally normal valve.   Leaflet separation was normal.  Doppler:  Transvalvular velocity was within the normal range. There was no  regurgitation.  ------------------------------------------------------------------- Right atrium:  The atrium was normal in size.  ------------------------------------------------------------------- Pericardium:  There was no pericardial effusion.   ------------------------------------------------------------------- Prepared and Electronically Authenticated by  Dorris Carnes, M.D. 2015-05-30T14:57:23  ------------------------------------------------------------------- Measurements   Left ventricle                         Value        Reference  LV ID, ED, PLAX chordal        (N)     50.4  mm     43 - 52  LV ID, ES, PLAX chordal        (H)     42.3  mm     23 - 38  LV fx shortening, PLAX chordal (L)     16    %      >=29  LV PW thickness, ED                    9.8   mm     ---------  IVS/LV PW ratio, ED            (N)     1.02         <=1.3    Ventricular septum                     Value        Reference  IVS thickness, ED                      10    mm     ---------    Aorta                                  Value        Reference  Aortic root ID, ED                     30    mm     ---------    Left atrium                            Value        Reference  LA ID, A-P, ES                         29    mm     ---------  LA ID/bsa, A-P                 (N)     1.34  cm/m^2 <=2.2    Mitral valve                           Value        Reference  Mitral E-wave peak velocity            47.1  cm/s   ---------  Mitral A-wave peak velocity            99.2  cm/s   ---------  Mitral deceleration time       (H)     407   ms     150 - 230  Mitral E/A ratio, peak                 0.5          ---------    Systemic veins                         Value        Reference  Estimated CVP                          3     mm Hg  ---------  Noninvasive Vascular Lab  Carotid Duplex Study  Patient:    Robson, Trickey MR #:       [id number] Study Date: 03/31/2014 Gender:     M Age:         55 Height: Weight: BSA: Pt. Status: Room:       [place], [place], [place] [id number]  Cheral Almas    Gabriel Carina    [place], [place]  Reports also to:  ------------------------------------------------------------------- History and indications:  Indications  CVA/Stroke.  781.2 Abnormality of gait.  History  Diagnostic evaluation.  Risk factors:  Former tobacco use. Hypertension.  ------------------------------------------------------------------- Study information:  Study status:  Routine.  Procedure:  The right CCA, right ECA, right ICA, right vertebral, left CCA, left ECA, left ICA, and left vertebral arteries were examined. A vascular evaluation was performed with the patient in the supine position. Image quality was adequate.  Arterial flow:  +--------------------+---------+--------+ Location            V sys    V ed     +--------------------+---------+--------+ Right CCA - proximal73 cm/s  13 cm/s  +--------------------+---------+--------+ Right CCA - distal  -50 cm/s -4 cm/s  +--------------------+---------+--------+ Right ECA           -77 cm/s -8 cm/s  +--------------------+---------+--------+ Right ICA - proximal-49 cm/s -9 cm/s  +--------------------+---------+--------+ Right ICA - distal  -73 cm/s -19 cm/s +--------------------+---------+--------+ Right vertebral     -31 cm/s -6 cm/s  +--------------------+---------+--------+ Left CCA - proximal 87 cm/s  11 cm/s  +--------------------+---------+--------+ Left CCA - distal   -92 cm/s -9 cm/s  +--------------------+---------+--------+ Left ECA            -109 cm/s-2 cm/s  +--------------------+---------+--------+ Left ICA - proximal 40 cm/s  11 cm/s  +--------------------+---------+--------+ Left ICA - distal   -74 cm/s -19  cm/s +--------------------+---------+--------+ Left vertebral      50 cm/s  13 cm/s  +--------------------+---------+--------+  ------------------------------------------------------------------- Summary:  Bilateral: mild to moderate soft plaque throughout CCA and origin ICA. 1-39% ICA stenosis. Vertebral artery flow is antegrade.  CT HEAD WITHOUT CONTRAST   TECHNIQUE: Contiguous axial images were obtained from the base of the skull through the vertex without intravenous contrast.   COMPARISON:  None.   FINDINGS: There is no evidence of acute infarction, mass lesion, or intra- or extra-axial hemorrhage on CT.   Prominence of the ventricles and sulci reflects moderate cortical volume loss. Diffuse periventricular and subcortical white matter change likely reflects small vessel ischemic microangiopathy. Cerebellar atrophy is noted. A chronic lacunar infarct is noted at the right thalamus.   The brainstem and fourth ventricle are within normal limits. The cerebral hemispheres demonstrate grossly normal gray-white differentiation. No mass effect or midline shift is seen.   There is no evidence of fracture; visualized osseous structures are unremarkable in appearance. The orbits are within normal limits. The paranasal sinuses and mastoid air cells are well-aerated. No significant soft tissue abnormalities are seen.   IMPRESSION: 1. No evidence of traumatic intracranial injury or fracture. 2. Moderate cortical volume loss and diffuse small vessel ischemic microangiopathy. 3. Chronic lacunar infarct at the right thalamus.   THORACIC SPINE - 2 VIEW   COMPARISON:  Chest radiograph performed 08/21/2013   FINDINGS: There is no evidence of fracture or subluxation. Vertebral bodies demonstrate normal height and alignment. Intervertebral disc spaces are preserved.   The visualized portions of both lungs are clear. The mediastinum is unremarkable in appearance.    IMPRESSION: No evidence of fracture or subluxation along the thoracic spine.   LUMBAR SPINE - COMPLETE 4+ VIEW   COMPARISON:  CT of the abdomen and pelvis performed 09/25/2013   FINDINGS: There is no evidence of fracture or subluxation. Vertebral bodies demonstrate normal height and alignment. Intervertebral disc spaces are preserved. The visualized neural foramina are grossly unremarkable in appearance.   The visualized bowel gas pattern is unremarkable in appearance; air and stool are noted within the colon. The sacroiliac joints are within normal limits. An aortoiliac stent graft is noted.   IMPRESSION: No evidence of fracture or subluxation along the lumbar spine. PELVIS - 1-2 VIEW   COMPARISON:  CTA of the abdomen and pelvis performed 09/25/2013   FINDINGS: There is no evidence of fracture or dislocation. Both femoral heads are seated normally within their respective acetabula. No significant degenerative change is appreciated. The sacroiliac joints are unremarkable in appearance.   The visualized bowel gas pattern is grossly unremarkable in appearance. The patient's aortoiliac stent graft is partially imaged.   IMPRESSION: No evidence of fracture or dislocation MRI HEAD WITHOUT AND WITH CONTRAST   MRA HEAD WITHOUT CONTRAST   TECHNIQUE: Multiplanar, multiecho pulse sequences of the brain and surrounding structures were obtained without and with intravenous contrast. Angiographic images of the head were obtained using MRA technique without contrast.   CONTRAST:  60mL MULTIHANCE GADOBENATE DIMEGLUMINE 529 MG/ML IV SOLN   COMPARISON:  CT head 03/26/2012   FINDINGS: MRI HEAD FINDINGS   Acute right PCA infarct. Acute infarct in the right lateral thalamus and in the right occipital lobe. No other areas of acute infarct.   Moderate atrophy. Extensive chronic microvascular ischemic changes throughout the cerebral white matter and pons. Chronic infarct  left thalamus. Ventricular enlargement consistent with atrophy.   Negative for intracranial hemorrhage. Negative for mass or edema. Normal enhancement following contrast administration.   Paranasal sinuses are clear.   MRA HEAD FINDINGS   Both vertebral arteries are  patent to the basilar. The basilar is widely patent. Superior cerebellar arteries are patent bilaterally. Posterior cerebral arteries are patent bilaterally with a moderate stenosis in the left mid PCA. Right PCA widely patent.   Mild atherosclerotic disease in the cavernous carotid bilaterally. Mild to moderate atherosclerotic disease involving the middle cerebral artery branches bilaterally. Mild left M1 segment stenosis. Moderate disease in the right anterior cerebral artery. Left anterior cerebral artery widely patent.   Negative for cerebral aneurysm.   IMPRESSION: Acute infarct right posterior cerebral artery territory.   Atrophy and advanced chronic microvascular ischemia.   Moderate intracranial atherosclerotic disease. Of note, the right posterior cerebral artery is widely patent.     ASSESSMENT/PLAN:  Right PCA infarct-continue rehabilitation Hypertension-well-controlled peripheral neuropathy-denies ongoing symptoms Depression-continue Cymbalta  I have reviewed patient's medical records received at admission/from hospitalization.  CPT CODE: 50722  Gayani Y Dasanayaka, [place] [phone]

## 2014-05-13 ENCOUNTER — Telehealth: Payer: Self-pay

## 2014-05-13 ENCOUNTER — Non-Acute Institutional Stay (SKILLED_NURSING_FACILITY): Payer: Medicare Other | Admitting: Adult Health

## 2014-05-13 ENCOUNTER — Encounter: Payer: Self-pay | Admitting: Adult Health

## 2014-05-13 DIAGNOSIS — F329 Major depressive disorder, single episode, unspecified: Secondary | ICD-10-CM | POA: Diagnosis not present

## 2014-05-13 DIAGNOSIS — F3289 Other specified depressive episodes: Secondary | ICD-10-CM

## 2014-05-13 DIAGNOSIS — I10 Essential (primary) hypertension: Secondary | ICD-10-CM | POA: Diagnosis not present

## 2014-05-13 DIAGNOSIS — G63 Polyneuropathy in diseases classified elsewhere: Secondary | ICD-10-CM | POA: Diagnosis not present

## 2014-05-13 DIAGNOSIS — I6389 Other cerebral infarction: Secondary | ICD-10-CM

## 2014-05-13 DIAGNOSIS — K219 Gastro-esophageal reflux disease without esophagitis: Secondary | ICD-10-CM

## 2014-05-13 DIAGNOSIS — I6350 Cerebral infarction due to unspecified occlusion or stenosis of unspecified cerebral artery: Secondary | ICD-10-CM | POA: Diagnosis not present

## 2014-05-13 NOTE — Telephone Encounter (Signed)
Herbert Seta from [facility] called and wanted to know the stop date for Levaquin. Attempted to contact Levada Dy. Left a message with another RN informing her that patient's hospital discharge medications does not shoe Levaquin, and she may want to contact patient's PCP.

## 2014-05-13 NOTE — Progress Notes (Signed)
Patient ID: Carl Rios, male   DOB: [birth year], 80 y.o.   MRN: [id number]              PROGRESS NOTE  DATE: 05/13/2014   FACILITY: [facility]  LEVEL OF CARE: SNF (31)  Acute Visit  CHIEF COMPLAINT:  Discharge Notes  HISTORY OF PRESENT ILLNESS: This is an 80 year old male who is for discharge to a Senior apartment with Home health PT and OT. DME:  Rolling walker. He has been admitted to [facility] on 04/15/14 from [facility] with functional deficits secondary to right posterior communicating artery infarct with Rhabdomyolysis. Patient was admitted to this facility for short-term rehabilitation after the patient's recent hospitalization.  Patient has completed SNF rehabilitation and therapy has cleared the patient for discharge.  Reassessment of ongoing problem(s):  HTN: Pt 's HTN remains stable.  Denies CP, sob, DOE, pedal edema, headaches, dizziness or visual disturbances.  No complications from the medications currently being used.  Last BP : 121/74  DEPRESSION: The depression remains stable. Patient denies ongoing feelings of sadness, insomnia, anedhonia or lack of appetite. No complications reported from the medications currently being used. Staff do not report behavioral problems.  GERD: pt's GERD is stable.  Denies ongoing heartburn, abd. Pain, nausea or vomiting.  Currently on a PPI & tolerates it without any adverse reactions.  PAST MEDICAL HISTORY : Reviewed.  No changes/see problem list  CURRENT MEDICATIONS: Reviewed per MAR/see medication list  REVIEW OF SYSTEMS:  GENERAL: no change in appetite, no fatigue, no weight changes, no fever, chills or weakness RESPIRATORY: no cough, SOB, DOE, wheezing, hemoptysis CARDIAC: no chest pain, edema or palpitations GI: no abdominal pain, diarrhea, constipation, heart burn, nausea or vomiting  PHYSICAL EXAMINATION  GENERAL: no acute distress, normal body habitus EYES: conjunctivae normal, sclerae  normal, normal eye lids NECK: supple, trachea midline, no neck masses, no thyroid tenderness, no thyromegaly LYMPHATICS: no LAN in the neck, no supraclavicular LAN RESPIRATORY: breathing is even & unlabored, BS CTAB CARDIAC: RRR, no murmur,no extra heart sounds, no edema GI: abdomen soft, normal BS, no masses, no tenderness, no hepatomegaly, no splenomegaly EXTREMITIES: able to move all 4 extremities PSYCHIATRIC: the patient is alert & oriented to person, affect & behavior appropriate  LABS/RADIOLOGY: Labs reviewed: Basic Metabolic Panel:  Recent Labs  08/21/13 1349 08/23/13 1120  04/01/14 0545 04/02/14 0700 04/08/14 0347 04/08/14 0730 04/15/14 0745  NA 136 137  < > 139 139  --  138  --   K 4.2 3.8  < > 3.9 5.3  --  4.4  --   CL 102 103  < > 101 102  --  101  --   CO2 19 22  < > 25 26  --  25  --   GLUCOSE 103* 172*  < > 108* 113*  --  104*  --   BUN 18 19  < > 20 18  --  15  --   CREATININE 0.83 0.76  < > 0.85 0.87 0.91 0.88 0.74  CALCIUM 9.1 8.3*  < > 9.2 9.3  --  9.2  --   MG  --  2.0  --   --   --   --   --   --   < > = values in this interval not displayed. Liver Function Tests:  Recent Labs  08/21/13 1349 03/27/14 0545 04/02/14 0700  AST 21 38* 32  ALT 17 22 26   ALKPHOS  80 84 87  BILITOT 0.4 1.2 0.7  PROT 6.7 6.7 6.3  ALBUMIN 3.8 3.6 3.2*   CBC:  Recent Labs  03/31/14 0612 04/01/14 0545 04/02/14 0700  WBC 7.6 8.9 9.0  NEUTROABS  --   --  6.5  HGB 14.5 14.6 14.1  HCT 42.8 45.0 42.7  MCV 88.8 90.7 90.3  PLT 259 302 294   A1C: No components found with this basename: A1C,  Lipid Panel:  Recent Labs  03/30/14 0540  HDL 34*   Cardiac Enzymes:  Recent Labs  03/26/14 2125  03/30/14 0540 03/31/14 0612 04/01/14 0545  CKTOTAL 1550*  < > 168 93 63  TROPONINI <0.30  --   --   --   --   < > = values in this interval not displayed.  CBG:  Recent Labs  03/26/14 2112  GLUCAP 133*     ASSESSMENT/PLAN:  CVA (cerebral infarct) - for Home  health PT and OT Peripheral neuropathy - no complaints; stable Hypertension - well controlled; continue Lopressor Depression - stable; continue Cymbalta GERD - continue Prilosec    I have filled out patient's discharge paperwork and written prescriptions.  Patient will receive home health PT and OT.  DME provided:  Rolling walker  Total discharge time: Greater/less than 30 minutes  Discharge time involved coordination of the discharge process with Education officer, museum, nursing staff and therapy department. Medical justification for home health services/DME verified.   CPT CODE: 14481  Monina Vargas [facility] [phone]

## 2014-05-14 ENCOUNTER — Telehealth: Payer: Self-pay | Admitting: *Deleted

## 2014-05-14 NOTE — Telephone Encounter (Signed)
Carl Rios is calling back to find out  A stop date for the Lovenox he was discharged on.  I see it mentioned in the discharge note but nothing more.  Are you in charge of this?  Please advise

## 2014-05-14 NOTE — Telephone Encounter (Signed)
May discontinue Lovenox, he was in it during hospital stay

## 2014-05-14 NOTE — Telephone Encounter (Signed)
Spoke with [facility] and informed them patient can stop his lovenox.

## 2014-05-20 DIAGNOSIS — I69998 Other sequelae following unspecified cerebrovascular disease: Secondary | ICD-10-CM | POA: Diagnosis not present

## 2014-05-20 DIAGNOSIS — G609 Hereditary and idiopathic neuropathy, unspecified: Secondary | ICD-10-CM | POA: Diagnosis not present

## 2014-05-20 DIAGNOSIS — I10 Essential (primary) hypertension: Secondary | ICD-10-CM | POA: Diagnosis not present

## 2014-05-20 DIAGNOSIS — I714 Abdominal aortic aneurysm, without rupture, unspecified: Secondary | ICD-10-CM | POA: Diagnosis not present

## 2014-05-20 DIAGNOSIS — R269 Unspecified abnormalities of gait and mobility: Secondary | ICD-10-CM | POA: Diagnosis not present

## 2014-05-20 DIAGNOSIS — K5730 Diverticulosis of large intestine without perforation or abscess without bleeding: Secondary | ICD-10-CM | POA: Diagnosis not present

## 2014-05-21 ENCOUNTER — Telehealth: Payer: Self-pay

## 2014-05-21 NOTE — Telephone Encounter (Signed)
Error

## 2014-05-21 NOTE — Telephone Encounter (Signed)
Napanoch a physical therapist with [facility] called to get verbal to continue therapy and get an order for a skilled nurse visit.  Left message giving verbal for therapy and nurse.

## 2014-05-22 DIAGNOSIS — I69998 Other sequelae following unspecified cerebrovascular disease: Secondary | ICD-10-CM | POA: Diagnosis not present

## 2014-05-22 DIAGNOSIS — I714 Abdominal aortic aneurysm, without rupture, unspecified: Secondary | ICD-10-CM | POA: Diagnosis not present

## 2014-05-22 DIAGNOSIS — I10 Essential (primary) hypertension: Secondary | ICD-10-CM | POA: Diagnosis not present

## 2014-05-22 DIAGNOSIS — R269 Unspecified abnormalities of gait and mobility: Secondary | ICD-10-CM | POA: Diagnosis not present

## 2014-05-22 DIAGNOSIS — K5730 Diverticulosis of large intestine without perforation or abscess without bleeding: Secondary | ICD-10-CM | POA: Diagnosis not present

## 2014-05-22 DIAGNOSIS — G609 Hereditary and idiopathic neuropathy, unspecified: Secondary | ICD-10-CM | POA: Diagnosis not present

## 2014-05-23 DIAGNOSIS — I10 Essential (primary) hypertension: Secondary | ICD-10-CM | POA: Diagnosis not present

## 2014-05-23 DIAGNOSIS — R269 Unspecified abnormalities of gait and mobility: Secondary | ICD-10-CM | POA: Diagnosis not present

## 2014-05-23 DIAGNOSIS — I69998 Other sequelae following unspecified cerebrovascular disease: Secondary | ICD-10-CM | POA: Diagnosis not present

## 2014-05-23 DIAGNOSIS — K5730 Diverticulosis of large intestine without perforation or abscess without bleeding: Secondary | ICD-10-CM | POA: Diagnosis not present

## 2014-05-23 DIAGNOSIS — I714 Abdominal aortic aneurysm, without rupture, unspecified: Secondary | ICD-10-CM | POA: Diagnosis not present

## 2014-05-23 DIAGNOSIS — G609 Hereditary and idiopathic neuropathy, unspecified: Secondary | ICD-10-CM | POA: Diagnosis not present

## 2014-05-24 DIAGNOSIS — K5730 Diverticulosis of large intestine without perforation or abscess without bleeding: Secondary | ICD-10-CM | POA: Diagnosis not present

## 2014-05-24 DIAGNOSIS — I69998 Other sequelae following unspecified cerebrovascular disease: Secondary | ICD-10-CM | POA: Diagnosis not present

## 2014-05-24 DIAGNOSIS — I10 Essential (primary) hypertension: Secondary | ICD-10-CM | POA: Diagnosis not present

## 2014-05-24 DIAGNOSIS — G609 Hereditary and idiopathic neuropathy, unspecified: Secondary | ICD-10-CM | POA: Diagnosis not present

## 2014-05-24 DIAGNOSIS — I714 Abdominal aortic aneurysm, without rupture, unspecified: Secondary | ICD-10-CM | POA: Diagnosis not present

## 2014-05-24 DIAGNOSIS — R269 Unspecified abnormalities of gait and mobility: Secondary | ICD-10-CM | POA: Diagnosis not present

## 2014-05-27 DIAGNOSIS — I69998 Other sequelae following unspecified cerebrovascular disease: Secondary | ICD-10-CM | POA: Diagnosis not present

## 2014-05-27 DIAGNOSIS — I10 Essential (primary) hypertension: Secondary | ICD-10-CM | POA: Diagnosis not present

## 2014-05-27 DIAGNOSIS — R269 Unspecified abnormalities of gait and mobility: Secondary | ICD-10-CM | POA: Diagnosis not present

## 2014-05-27 DIAGNOSIS — I714 Abdominal aortic aneurysm, without rupture, unspecified: Secondary | ICD-10-CM | POA: Diagnosis not present

## 2014-05-27 DIAGNOSIS — G609 Hereditary and idiopathic neuropathy, unspecified: Secondary | ICD-10-CM | POA: Diagnosis not present

## 2014-05-27 DIAGNOSIS — K5730 Diverticulosis of large intestine without perforation or abscess without bleeding: Secondary | ICD-10-CM | POA: Diagnosis not present

## 2014-05-28 DIAGNOSIS — I10 Essential (primary) hypertension: Secondary | ICD-10-CM | POA: Diagnosis not present

## 2014-05-28 DIAGNOSIS — I714 Abdominal aortic aneurysm, without rupture, unspecified: Secondary | ICD-10-CM | POA: Diagnosis not present

## 2014-05-28 DIAGNOSIS — G609 Hereditary and idiopathic neuropathy, unspecified: Secondary | ICD-10-CM | POA: Diagnosis not present

## 2014-05-28 DIAGNOSIS — I69998 Other sequelae following unspecified cerebrovascular disease: Secondary | ICD-10-CM | POA: Diagnosis not present

## 2014-05-28 DIAGNOSIS — K5730 Diverticulosis of large intestine without perforation or abscess without bleeding: Secondary | ICD-10-CM | POA: Diagnosis not present

## 2014-05-28 DIAGNOSIS — R269 Unspecified abnormalities of gait and mobility: Secondary | ICD-10-CM | POA: Diagnosis not present

## 2014-05-30 DIAGNOSIS — I714 Abdominal aortic aneurysm, without rupture, unspecified: Secondary | ICD-10-CM | POA: Diagnosis not present

## 2014-05-30 DIAGNOSIS — I10 Essential (primary) hypertension: Secondary | ICD-10-CM | POA: Diagnosis not present

## 2014-05-30 DIAGNOSIS — R269 Unspecified abnormalities of gait and mobility: Secondary | ICD-10-CM | POA: Diagnosis not present

## 2014-05-30 DIAGNOSIS — I69998 Other sequelae following unspecified cerebrovascular disease: Secondary | ICD-10-CM | POA: Diagnosis not present

## 2014-05-30 DIAGNOSIS — K5730 Diverticulosis of large intestine without perforation or abscess without bleeding: Secondary | ICD-10-CM | POA: Diagnosis not present

## 2014-05-30 DIAGNOSIS — G609 Hereditary and idiopathic neuropathy, unspecified: Secondary | ICD-10-CM | POA: Diagnosis not present

## 2014-05-31 DIAGNOSIS — R269 Unspecified abnormalities of gait and mobility: Secondary | ICD-10-CM | POA: Diagnosis not present

## 2014-05-31 DIAGNOSIS — I10 Essential (primary) hypertension: Secondary | ICD-10-CM | POA: Diagnosis not present

## 2014-05-31 DIAGNOSIS — I714 Abdominal aortic aneurysm, without rupture, unspecified: Secondary | ICD-10-CM | POA: Diagnosis not present

## 2014-05-31 DIAGNOSIS — I69998 Other sequelae following unspecified cerebrovascular disease: Secondary | ICD-10-CM | POA: Diagnosis not present

## 2014-05-31 DIAGNOSIS — K5730 Diverticulosis of large intestine without perforation or abscess without bleeding: Secondary | ICD-10-CM | POA: Diagnosis not present

## 2014-05-31 DIAGNOSIS — G609 Hereditary and idiopathic neuropathy, unspecified: Secondary | ICD-10-CM | POA: Diagnosis not present

## 2014-06-04 DIAGNOSIS — I69998 Other sequelae following unspecified cerebrovascular disease: Secondary | ICD-10-CM | POA: Diagnosis not present

## 2014-06-04 DIAGNOSIS — R269 Unspecified abnormalities of gait and mobility: Secondary | ICD-10-CM | POA: Diagnosis not present

## 2014-06-04 DIAGNOSIS — G609 Hereditary and idiopathic neuropathy, unspecified: Secondary | ICD-10-CM | POA: Diagnosis not present

## 2014-06-04 DIAGNOSIS — K5730 Diverticulosis of large intestine without perforation or abscess without bleeding: Secondary | ICD-10-CM | POA: Diagnosis not present

## 2014-06-04 DIAGNOSIS — I10 Essential (primary) hypertension: Secondary | ICD-10-CM | POA: Diagnosis not present

## 2014-06-04 DIAGNOSIS — I714 Abdominal aortic aneurysm, without rupture, unspecified: Secondary | ICD-10-CM | POA: Diagnosis not present

## 2014-06-05 DIAGNOSIS — I714 Abdominal aortic aneurysm, without rupture, unspecified: Secondary | ICD-10-CM | POA: Diagnosis not present

## 2014-06-05 DIAGNOSIS — I10 Essential (primary) hypertension: Secondary | ICD-10-CM | POA: Diagnosis not present

## 2014-06-05 DIAGNOSIS — R269 Unspecified abnormalities of gait and mobility: Secondary | ICD-10-CM | POA: Diagnosis not present

## 2014-06-05 DIAGNOSIS — G609 Hereditary and idiopathic neuropathy, unspecified: Secondary | ICD-10-CM | POA: Diagnosis not present

## 2014-06-05 DIAGNOSIS — I69998 Other sequelae following unspecified cerebrovascular disease: Secondary | ICD-10-CM | POA: Diagnosis not present

## 2014-06-05 DIAGNOSIS — K5730 Diverticulosis of large intestine without perforation or abscess without bleeding: Secondary | ICD-10-CM | POA: Diagnosis not present

## 2014-06-06 DIAGNOSIS — I69998 Other sequelae following unspecified cerebrovascular disease: Secondary | ICD-10-CM | POA: Diagnosis not present

## 2014-06-06 DIAGNOSIS — I714 Abdominal aortic aneurysm, without rupture, unspecified: Secondary | ICD-10-CM | POA: Diagnosis not present

## 2014-06-06 DIAGNOSIS — R269 Unspecified abnormalities of gait and mobility: Secondary | ICD-10-CM | POA: Diagnosis not present

## 2014-06-06 DIAGNOSIS — K5730 Diverticulosis of large intestine without perforation or abscess without bleeding: Secondary | ICD-10-CM | POA: Diagnosis not present

## 2014-06-06 DIAGNOSIS — I10 Essential (primary) hypertension: Secondary | ICD-10-CM | POA: Diagnosis not present

## 2014-06-06 DIAGNOSIS — G609 Hereditary and idiopathic neuropathy, unspecified: Secondary | ICD-10-CM | POA: Diagnosis not present

## 2014-06-10 ENCOUNTER — Inpatient Hospital Stay: Payer: Medicare Other | Admitting: Physical Medicine & Rehabilitation

## 2014-06-10 ENCOUNTER — Other Ambulatory Visit: Payer: Self-pay

## 2014-06-10 ENCOUNTER — Encounter: Payer: Medicare Other | Attending: Physical Medicine & Rehabilitation

## 2014-06-10 ENCOUNTER — Ambulatory Visit: Payer: Medicare Other | Admitting: Neurology

## 2014-06-10 DIAGNOSIS — I714 Abdominal aortic aneurysm, without rupture, unspecified: Secondary | ICD-10-CM | POA: Diagnosis not present

## 2014-06-10 DIAGNOSIS — G609 Hereditary and idiopathic neuropathy, unspecified: Secondary | ICD-10-CM | POA: Diagnosis not present

## 2014-06-10 DIAGNOSIS — I69998 Other sequelae following unspecified cerebrovascular disease: Secondary | ICD-10-CM | POA: Diagnosis not present

## 2014-06-10 DIAGNOSIS — I10 Essential (primary) hypertension: Secondary | ICD-10-CM | POA: Diagnosis not present

## 2014-06-10 DIAGNOSIS — R269 Unspecified abnormalities of gait and mobility: Secondary | ICD-10-CM | POA: Diagnosis not present

## 2014-06-10 DIAGNOSIS — K5730 Diverticulosis of large intestine without perforation or abscess without bleeding: Secondary | ICD-10-CM | POA: Diagnosis not present

## 2014-06-11 DIAGNOSIS — R269 Unspecified abnormalities of gait and mobility: Secondary | ICD-10-CM | POA: Diagnosis not present

## 2014-06-11 DIAGNOSIS — G609 Hereditary and idiopathic neuropathy, unspecified: Secondary | ICD-10-CM | POA: Diagnosis not present

## 2014-06-11 DIAGNOSIS — I714 Abdominal aortic aneurysm, without rupture, unspecified: Secondary | ICD-10-CM | POA: Diagnosis not present

## 2014-06-11 DIAGNOSIS — I10 Essential (primary) hypertension: Secondary | ICD-10-CM | POA: Diagnosis not present

## 2014-06-11 DIAGNOSIS — I69998 Other sequelae following unspecified cerebrovascular disease: Secondary | ICD-10-CM | POA: Diagnosis not present

## 2014-06-11 DIAGNOSIS — K5730 Diverticulosis of large intestine without perforation or abscess without bleeding: Secondary | ICD-10-CM | POA: Diagnosis not present

## 2014-06-12 DIAGNOSIS — I714 Abdominal aortic aneurysm, without rupture, unspecified: Secondary | ICD-10-CM | POA: Diagnosis not present

## 2014-06-12 DIAGNOSIS — I10 Essential (primary) hypertension: Secondary | ICD-10-CM | POA: Diagnosis not present

## 2014-06-12 DIAGNOSIS — R269 Unspecified abnormalities of gait and mobility: Secondary | ICD-10-CM | POA: Diagnosis not present

## 2014-06-12 DIAGNOSIS — I69998 Other sequelae following unspecified cerebrovascular disease: Secondary | ICD-10-CM | POA: Diagnosis not present

## 2014-06-12 DIAGNOSIS — G609 Hereditary and idiopathic neuropathy, unspecified: Secondary | ICD-10-CM | POA: Diagnosis not present

## 2014-06-12 DIAGNOSIS — K5730 Diverticulosis of large intestine without perforation or abscess without bleeding: Secondary | ICD-10-CM | POA: Diagnosis not present

## 2014-06-13 ENCOUNTER — Telehealth: Payer: Self-pay

## 2014-06-13 NOTE — Telephone Encounter (Signed)
Carl Rios @ [facility] is requesting a verbal order to extend patient's PT. Verbal given.

## 2014-06-14 DIAGNOSIS — G609 Hereditary and idiopathic neuropathy, unspecified: Secondary | ICD-10-CM | POA: Diagnosis not present

## 2014-06-14 DIAGNOSIS — I714 Abdominal aortic aneurysm, without rupture, unspecified: Secondary | ICD-10-CM | POA: Diagnosis not present

## 2014-06-14 DIAGNOSIS — I69998 Other sequelae following unspecified cerebrovascular disease: Secondary | ICD-10-CM | POA: Diagnosis not present

## 2014-06-14 DIAGNOSIS — R269 Unspecified abnormalities of gait and mobility: Secondary | ICD-10-CM | POA: Diagnosis not present

## 2014-06-14 DIAGNOSIS — I10 Essential (primary) hypertension: Secondary | ICD-10-CM | POA: Diagnosis not present

## 2014-06-14 DIAGNOSIS — K5730 Diverticulosis of large intestine without perforation or abscess without bleeding: Secondary | ICD-10-CM | POA: Diagnosis not present

## 2014-06-18 DIAGNOSIS — R269 Unspecified abnormalities of gait and mobility: Secondary | ICD-10-CM | POA: Diagnosis not present

## 2014-06-18 DIAGNOSIS — G609 Hereditary and idiopathic neuropathy, unspecified: Secondary | ICD-10-CM

## 2014-06-18 DIAGNOSIS — I69998 Other sequelae following unspecified cerebrovascular disease: Secondary | ICD-10-CM | POA: Diagnosis not present

## 2014-06-18 DIAGNOSIS — I714 Abdominal aortic aneurysm, without rupture, unspecified: Secondary | ICD-10-CM | POA: Diagnosis not present

## 2014-06-18 DIAGNOSIS — I10 Essential (primary) hypertension: Secondary | ICD-10-CM | POA: Diagnosis not present

## 2014-06-18 DIAGNOSIS — K5730 Diverticulosis of large intestine without perforation or abscess without bleeding: Secondary | ICD-10-CM | POA: Diagnosis not present

## 2014-06-19 DIAGNOSIS — I714 Abdominal aortic aneurysm, without rupture, unspecified: Secondary | ICD-10-CM | POA: Diagnosis not present

## 2014-06-19 DIAGNOSIS — K5730 Diverticulosis of large intestine without perforation or abscess without bleeding: Secondary | ICD-10-CM | POA: Diagnosis not present

## 2014-06-19 DIAGNOSIS — G609 Hereditary and idiopathic neuropathy, unspecified: Secondary | ICD-10-CM | POA: Diagnosis not present

## 2014-06-19 DIAGNOSIS — I69998 Other sequelae following unspecified cerebrovascular disease: Secondary | ICD-10-CM | POA: Diagnosis not present

## 2014-06-19 DIAGNOSIS — R269 Unspecified abnormalities of gait and mobility: Secondary | ICD-10-CM | POA: Diagnosis not present

## 2014-06-19 DIAGNOSIS — I10 Essential (primary) hypertension: Secondary | ICD-10-CM | POA: Diagnosis not present

## 2014-06-20 DIAGNOSIS — E785 Hyperlipidemia, unspecified: Secondary | ICD-10-CM | POA: Diagnosis not present

## 2014-06-20 DIAGNOSIS — R030 Elevated blood-pressure reading, without diagnosis of hypertension: Secondary | ICD-10-CM | POA: Diagnosis not present

## 2014-06-20 DIAGNOSIS — K5730 Diverticulosis of large intestine without perforation or abscess without bleeding: Secondary | ICD-10-CM | POA: Diagnosis not present

## 2014-06-20 DIAGNOSIS — M6282 Rhabdomyolysis: Secondary | ICD-10-CM | POA: Diagnosis not present

## 2014-06-20 DIAGNOSIS — I714 Abdominal aortic aneurysm, without rupture, unspecified: Secondary | ICD-10-CM | POA: Diagnosis not present

## 2014-06-20 DIAGNOSIS — I69998 Other sequelae following unspecified cerebrovascular disease: Secondary | ICD-10-CM | POA: Diagnosis not present

## 2014-06-20 DIAGNOSIS — H9190 Unspecified hearing loss, unspecified ear: Secondary | ICD-10-CM | POA: Diagnosis not present

## 2014-06-20 DIAGNOSIS — G609 Hereditary and idiopathic neuropathy, unspecified: Secondary | ICD-10-CM | POA: Diagnosis not present

## 2014-06-20 DIAGNOSIS — R269 Unspecified abnormalities of gait and mobility: Secondary | ICD-10-CM | POA: Diagnosis not present

## 2014-06-20 DIAGNOSIS — I10 Essential (primary) hypertension: Secondary | ICD-10-CM | POA: Diagnosis not present

## 2014-06-20 DIAGNOSIS — R7301 Impaired fasting glucose: Secondary | ICD-10-CM | POA: Diagnosis not present

## 2014-06-20 DIAGNOSIS — Z8679 Personal history of other diseases of the circulatory system: Secondary | ICD-10-CM | POA: Diagnosis not present

## 2014-06-24 DIAGNOSIS — I69998 Other sequelae following unspecified cerebrovascular disease: Secondary | ICD-10-CM | POA: Diagnosis not present

## 2014-06-24 DIAGNOSIS — K5730 Diverticulosis of large intestine without perforation or abscess without bleeding: Secondary | ICD-10-CM | POA: Diagnosis not present

## 2014-06-24 DIAGNOSIS — I714 Abdominal aortic aneurysm, without rupture, unspecified: Secondary | ICD-10-CM | POA: Diagnosis not present

## 2014-06-24 DIAGNOSIS — G609 Hereditary and idiopathic neuropathy, unspecified: Secondary | ICD-10-CM | POA: Diagnosis not present

## 2014-06-24 DIAGNOSIS — R269 Unspecified abnormalities of gait and mobility: Secondary | ICD-10-CM | POA: Diagnosis not present

## 2014-06-24 DIAGNOSIS — I10 Essential (primary) hypertension: Secondary | ICD-10-CM | POA: Diagnosis not present

## 2014-06-25 DIAGNOSIS — I10 Essential (primary) hypertension: Secondary | ICD-10-CM | POA: Diagnosis not present

## 2014-06-25 DIAGNOSIS — I69998 Other sequelae following unspecified cerebrovascular disease: Secondary | ICD-10-CM | POA: Diagnosis not present

## 2014-06-25 DIAGNOSIS — R269 Unspecified abnormalities of gait and mobility: Secondary | ICD-10-CM | POA: Diagnosis not present

## 2014-06-25 DIAGNOSIS — I714 Abdominal aortic aneurysm, without rupture, unspecified: Secondary | ICD-10-CM | POA: Diagnosis not present

## 2014-06-25 DIAGNOSIS — G609 Hereditary and idiopathic neuropathy, unspecified: Secondary | ICD-10-CM | POA: Diagnosis not present

## 2014-06-25 DIAGNOSIS — K5730 Diverticulosis of large intestine without perforation or abscess without bleeding: Secondary | ICD-10-CM | POA: Diagnosis not present

## 2014-06-27 ENCOUNTER — Telehealth: Payer: Self-pay | Admitting: *Deleted

## 2014-06-27 DIAGNOSIS — R269 Unspecified abnormalities of gait and mobility: Secondary | ICD-10-CM | POA: Diagnosis not present

## 2014-06-27 DIAGNOSIS — K5730 Diverticulosis of large intestine without perforation or abscess without bleeding: Secondary | ICD-10-CM | POA: Diagnosis not present

## 2014-06-27 DIAGNOSIS — G609 Hereditary and idiopathic neuropathy, unspecified: Secondary | ICD-10-CM | POA: Diagnosis not present

## 2014-06-27 DIAGNOSIS — I69998 Other sequelae following unspecified cerebrovascular disease: Secondary | ICD-10-CM | POA: Diagnosis not present

## 2014-06-27 DIAGNOSIS — I714 Abdominal aortic aneurysm, without rupture, unspecified: Secondary | ICD-10-CM | POA: Diagnosis not present

## 2014-06-27 DIAGNOSIS — I10 Essential (primary) hypertension: Secondary | ICD-10-CM | POA: Diagnosis not present

## 2014-06-27 NOTE — Telephone Encounter (Signed)
FYI Carl Rios fell this past Tuesday 06/25/14 in the home beside bed and reported to the OT today.  No injury but they have to report this to [place].

## 2014-06-27 NOTE — Telephone Encounter (Signed)
Noted, please notify us if pt develops pain

## 2014-06-28 DIAGNOSIS — K5730 Diverticulosis of large intestine without perforation or abscess without bleeding: Secondary | ICD-10-CM | POA: Diagnosis not present

## 2014-06-28 DIAGNOSIS — I69998 Other sequelae following unspecified cerebrovascular disease: Secondary | ICD-10-CM | POA: Diagnosis not present

## 2014-06-28 DIAGNOSIS — I10 Essential (primary) hypertension: Secondary | ICD-10-CM | POA: Diagnosis not present

## 2014-06-28 DIAGNOSIS — G609 Hereditary and idiopathic neuropathy, unspecified: Secondary | ICD-10-CM | POA: Diagnosis not present

## 2014-06-28 DIAGNOSIS — R269 Unspecified abnormalities of gait and mobility: Secondary | ICD-10-CM | POA: Diagnosis not present

## 2014-06-28 DIAGNOSIS — I714 Abdominal aortic aneurysm, without rupture, unspecified: Secondary | ICD-10-CM | POA: Diagnosis not present

## 2014-07-02 DIAGNOSIS — I69998 Other sequelae following unspecified cerebrovascular disease: Secondary | ICD-10-CM | POA: Diagnosis not present

## 2014-07-02 DIAGNOSIS — I10 Essential (primary) hypertension: Secondary | ICD-10-CM | POA: Diagnosis not present

## 2014-07-02 DIAGNOSIS — I714 Abdominal aortic aneurysm, without rupture, unspecified: Secondary | ICD-10-CM | POA: Diagnosis not present

## 2014-07-02 DIAGNOSIS — R269 Unspecified abnormalities of gait and mobility: Secondary | ICD-10-CM | POA: Diagnosis not present

## 2014-07-02 DIAGNOSIS — K5730 Diverticulosis of large intestine without perforation or abscess without bleeding: Secondary | ICD-10-CM | POA: Diagnosis not present

## 2014-07-02 DIAGNOSIS — G609 Hereditary and idiopathic neuropathy, unspecified: Secondary | ICD-10-CM | POA: Diagnosis not present

## 2014-07-04 DIAGNOSIS — G609 Hereditary and idiopathic neuropathy, unspecified: Secondary | ICD-10-CM | POA: Diagnosis not present

## 2014-07-04 DIAGNOSIS — I714 Abdominal aortic aneurysm, without rupture, unspecified: Secondary | ICD-10-CM | POA: Diagnosis not present

## 2014-07-04 DIAGNOSIS — R269 Unspecified abnormalities of gait and mobility: Secondary | ICD-10-CM | POA: Diagnosis not present

## 2014-07-04 DIAGNOSIS — K5730 Diverticulosis of large intestine without perforation or abscess without bleeding: Secondary | ICD-10-CM | POA: Diagnosis not present

## 2014-07-04 DIAGNOSIS — I10 Essential (primary) hypertension: Secondary | ICD-10-CM | POA: Diagnosis not present

## 2014-07-04 DIAGNOSIS — I69998 Other sequelae following unspecified cerebrovascular disease: Secondary | ICD-10-CM | POA: Diagnosis not present

## 2014-07-05 DIAGNOSIS — R269 Unspecified abnormalities of gait and mobility: Secondary | ICD-10-CM | POA: Diagnosis not present

## 2014-07-05 DIAGNOSIS — G609 Hereditary and idiopathic neuropathy, unspecified: Secondary | ICD-10-CM | POA: Diagnosis not present

## 2014-07-05 DIAGNOSIS — K5730 Diverticulosis of large intestine without perforation or abscess without bleeding: Secondary | ICD-10-CM | POA: Diagnosis not present

## 2014-07-05 DIAGNOSIS — I10 Essential (primary) hypertension: Secondary | ICD-10-CM | POA: Diagnosis not present

## 2014-07-05 DIAGNOSIS — I714 Abdominal aortic aneurysm, without rupture, unspecified: Secondary | ICD-10-CM | POA: Diagnosis not present

## 2014-07-05 DIAGNOSIS — I69998 Other sequelae following unspecified cerebrovascular disease: Secondary | ICD-10-CM | POA: Diagnosis not present

## 2014-07-09 DIAGNOSIS — I714 Abdominal aortic aneurysm, without rupture, unspecified: Secondary | ICD-10-CM | POA: Diagnosis not present

## 2014-07-09 DIAGNOSIS — I10 Essential (primary) hypertension: Secondary | ICD-10-CM | POA: Diagnosis not present

## 2014-07-09 DIAGNOSIS — I69998 Other sequelae following unspecified cerebrovascular disease: Secondary | ICD-10-CM | POA: Diagnosis not present

## 2014-07-09 DIAGNOSIS — G609 Hereditary and idiopathic neuropathy, unspecified: Secondary | ICD-10-CM | POA: Diagnosis not present

## 2014-07-09 DIAGNOSIS — K5730 Diverticulosis of large intestine without perforation or abscess without bleeding: Secondary | ICD-10-CM | POA: Diagnosis not present

## 2014-07-09 DIAGNOSIS — R269 Unspecified abnormalities of gait and mobility: Secondary | ICD-10-CM | POA: Diagnosis not present

## 2014-07-11 DIAGNOSIS — I69998 Other sequelae following unspecified cerebrovascular disease: Secondary | ICD-10-CM | POA: Diagnosis not present

## 2014-07-11 DIAGNOSIS — G609 Hereditary and idiopathic neuropathy, unspecified: Secondary | ICD-10-CM | POA: Diagnosis not present

## 2014-07-11 DIAGNOSIS — I714 Abdominal aortic aneurysm, without rupture, unspecified: Secondary | ICD-10-CM | POA: Diagnosis not present

## 2014-07-11 DIAGNOSIS — R269 Unspecified abnormalities of gait and mobility: Secondary | ICD-10-CM | POA: Diagnosis not present

## 2014-07-11 DIAGNOSIS — I10 Essential (primary) hypertension: Secondary | ICD-10-CM | POA: Diagnosis not present

## 2014-07-11 DIAGNOSIS — K5730 Diverticulosis of large intestine without perforation or abscess without bleeding: Secondary | ICD-10-CM | POA: Diagnosis not present

## 2014-07-15 DIAGNOSIS — I714 Abdominal aortic aneurysm, without rupture, unspecified: Secondary | ICD-10-CM | POA: Diagnosis not present

## 2014-07-15 DIAGNOSIS — R269 Unspecified abnormalities of gait and mobility: Secondary | ICD-10-CM | POA: Diagnosis not present

## 2014-07-15 DIAGNOSIS — I10 Essential (primary) hypertension: Secondary | ICD-10-CM | POA: Diagnosis not present

## 2014-07-15 DIAGNOSIS — G609 Hereditary and idiopathic neuropathy, unspecified: Secondary | ICD-10-CM | POA: Diagnosis not present

## 2014-07-15 DIAGNOSIS — K5730 Diverticulosis of large intestine without perforation or abscess without bleeding: Secondary | ICD-10-CM | POA: Diagnosis not present

## 2014-07-15 DIAGNOSIS — I69998 Other sequelae following unspecified cerebrovascular disease: Secondary | ICD-10-CM | POA: Diagnosis not present

## 2014-07-16 DIAGNOSIS — I69998 Other sequelae following unspecified cerebrovascular disease: Secondary | ICD-10-CM | POA: Diagnosis not present

## 2014-07-16 DIAGNOSIS — I714 Abdominal aortic aneurysm, without rupture, unspecified: Secondary | ICD-10-CM | POA: Diagnosis not present

## 2014-07-16 DIAGNOSIS — I10 Essential (primary) hypertension: Secondary | ICD-10-CM | POA: Diagnosis not present

## 2014-07-16 DIAGNOSIS — K5730 Diverticulosis of large intestine without perforation or abscess without bleeding: Secondary | ICD-10-CM | POA: Diagnosis not present

## 2014-07-16 DIAGNOSIS — R269 Unspecified abnormalities of gait and mobility: Secondary | ICD-10-CM | POA: Diagnosis not present

## 2014-07-16 DIAGNOSIS — G609 Hereditary and idiopathic neuropathy, unspecified: Secondary | ICD-10-CM | POA: Diagnosis not present

## 2014-07-17 DIAGNOSIS — G609 Hereditary and idiopathic neuropathy, unspecified: Secondary | ICD-10-CM | POA: Diagnosis not present

## 2014-07-17 DIAGNOSIS — I714 Abdominal aortic aneurysm, without rupture, unspecified: Secondary | ICD-10-CM | POA: Diagnosis not present

## 2014-07-17 DIAGNOSIS — I10 Essential (primary) hypertension: Secondary | ICD-10-CM | POA: Diagnosis not present

## 2014-07-17 DIAGNOSIS — K5730 Diverticulosis of large intestine without perforation or abscess without bleeding: Secondary | ICD-10-CM | POA: Diagnosis not present

## 2014-07-17 DIAGNOSIS — I69998 Other sequelae following unspecified cerebrovascular disease: Secondary | ICD-10-CM | POA: Diagnosis not present

## 2014-07-17 DIAGNOSIS — R269 Unspecified abnormalities of gait and mobility: Secondary | ICD-10-CM | POA: Diagnosis not present

## 2014-07-18 DIAGNOSIS — R279 Unspecified lack of coordination: Secondary | ICD-10-CM | POA: Diagnosis not present

## 2014-07-18 DIAGNOSIS — I69998 Other sequelae following unspecified cerebrovascular disease: Secondary | ICD-10-CM | POA: Diagnosis not present

## 2014-07-18 DIAGNOSIS — M6281 Muscle weakness (generalized): Secondary | ICD-10-CM | POA: Diagnosis not present

## 2014-07-18 DIAGNOSIS — R269 Unspecified abnormalities of gait and mobility: Secondary | ICD-10-CM | POA: Diagnosis not present

## 2014-07-19 DIAGNOSIS — M6281 Muscle weakness (generalized): Secondary | ICD-10-CM | POA: Diagnosis not present

## 2014-07-19 DIAGNOSIS — R279 Unspecified lack of coordination: Secondary | ICD-10-CM | POA: Diagnosis not present

## 2014-07-19 DIAGNOSIS — R269 Unspecified abnormalities of gait and mobility: Secondary | ICD-10-CM | POA: Diagnosis not present

## 2014-07-19 DIAGNOSIS — I69998 Other sequelae following unspecified cerebrovascular disease: Secondary | ICD-10-CM | POA: Diagnosis not present

## 2014-07-22 DIAGNOSIS — R279 Unspecified lack of coordination: Secondary | ICD-10-CM | POA: Diagnosis not present

## 2014-07-22 DIAGNOSIS — I69998 Other sequelae following unspecified cerebrovascular disease: Secondary | ICD-10-CM | POA: Diagnosis not present

## 2014-07-22 DIAGNOSIS — M6281 Muscle weakness (generalized): Secondary | ICD-10-CM | POA: Diagnosis not present

## 2014-07-22 DIAGNOSIS — R269 Unspecified abnormalities of gait and mobility: Secondary | ICD-10-CM | POA: Diagnosis not present

## 2014-07-23 DIAGNOSIS — M6281 Muscle weakness (generalized): Secondary | ICD-10-CM | POA: Diagnosis not present

## 2014-07-23 DIAGNOSIS — R269 Unspecified abnormalities of gait and mobility: Secondary | ICD-10-CM | POA: Diagnosis not present

## 2014-07-23 DIAGNOSIS — R279 Unspecified lack of coordination: Secondary | ICD-10-CM | POA: Diagnosis not present

## 2014-07-23 DIAGNOSIS — I69998 Other sequelae following unspecified cerebrovascular disease: Secondary | ICD-10-CM | POA: Diagnosis not present

## 2014-07-24 DIAGNOSIS — I69998 Other sequelae following unspecified cerebrovascular disease: Secondary | ICD-10-CM | POA: Diagnosis not present

## 2014-07-24 DIAGNOSIS — R269 Unspecified abnormalities of gait and mobility: Secondary | ICD-10-CM | POA: Diagnosis not present

## 2014-07-24 DIAGNOSIS — M6281 Muscle weakness (generalized): Secondary | ICD-10-CM | POA: Diagnosis not present

## 2014-07-24 DIAGNOSIS — R279 Unspecified lack of coordination: Secondary | ICD-10-CM | POA: Diagnosis not present

## 2014-07-26 DIAGNOSIS — M6281 Muscle weakness (generalized): Secondary | ICD-10-CM | POA: Diagnosis not present

## 2014-07-26 DIAGNOSIS — I69998 Other sequelae following unspecified cerebrovascular disease: Secondary | ICD-10-CM | POA: Diagnosis not present

## 2014-07-26 DIAGNOSIS — R279 Unspecified lack of coordination: Secondary | ICD-10-CM | POA: Diagnosis not present

## 2014-07-26 DIAGNOSIS — R269 Unspecified abnormalities of gait and mobility: Secondary | ICD-10-CM | POA: Diagnosis not present

## 2014-07-29 DIAGNOSIS — M6281 Muscle weakness (generalized): Secondary | ICD-10-CM | POA: Diagnosis not present

## 2014-07-29 DIAGNOSIS — R269 Unspecified abnormalities of gait and mobility: Secondary | ICD-10-CM | POA: Diagnosis not present

## 2014-07-29 DIAGNOSIS — R279 Unspecified lack of coordination: Secondary | ICD-10-CM | POA: Diagnosis not present

## 2014-07-29 DIAGNOSIS — I69998 Other sequelae following unspecified cerebrovascular disease: Secondary | ICD-10-CM | POA: Diagnosis not present

## 2014-07-30 DIAGNOSIS — R269 Unspecified abnormalities of gait and mobility: Secondary | ICD-10-CM | POA: Diagnosis not present

## 2014-07-30 DIAGNOSIS — I69998 Other sequelae following unspecified cerebrovascular disease: Secondary | ICD-10-CM | POA: Diagnosis not present

## 2014-07-30 DIAGNOSIS — R279 Unspecified lack of coordination: Secondary | ICD-10-CM | POA: Diagnosis not present

## 2014-07-30 DIAGNOSIS — M6281 Muscle weakness (generalized): Secondary | ICD-10-CM | POA: Diagnosis not present

## 2014-07-31 DIAGNOSIS — R269 Unspecified abnormalities of gait and mobility: Secondary | ICD-10-CM | POA: Diagnosis not present

## 2014-07-31 DIAGNOSIS — I69998 Other sequelae following unspecified cerebrovascular disease: Secondary | ICD-10-CM | POA: Diagnosis not present

## 2014-07-31 DIAGNOSIS — R279 Unspecified lack of coordination: Secondary | ICD-10-CM | POA: Diagnosis not present

## 2014-07-31 DIAGNOSIS — M6281 Muscle weakness (generalized): Secondary | ICD-10-CM | POA: Diagnosis not present

## 2014-08-01 DIAGNOSIS — R270 Ataxia, unspecified: Secondary | ICD-10-CM | POA: Diagnosis not present

## 2014-08-01 DIAGNOSIS — R278 Other lack of coordination: Secondary | ICD-10-CM | POA: Diagnosis not present

## 2014-08-01 DIAGNOSIS — R260 Ataxic gait: Secondary | ICD-10-CM | POA: Diagnosis not present

## 2014-08-01 DIAGNOSIS — M6281 Muscle weakness (generalized): Secondary | ICD-10-CM | POA: Diagnosis not present

## 2014-08-02 DIAGNOSIS — R278 Other lack of coordination: Secondary | ICD-10-CM | POA: Diagnosis not present

## 2014-08-02 DIAGNOSIS — R270 Ataxia, unspecified: Secondary | ICD-10-CM | POA: Diagnosis not present

## 2014-08-02 DIAGNOSIS — R260 Ataxic gait: Secondary | ICD-10-CM | POA: Diagnosis not present

## 2014-08-02 DIAGNOSIS — M6281 Muscle weakness (generalized): Secondary | ICD-10-CM | POA: Diagnosis not present

## 2014-08-05 DIAGNOSIS — R260 Ataxic gait: Secondary | ICD-10-CM | POA: Diagnosis not present

## 2014-08-05 DIAGNOSIS — M6281 Muscle weakness (generalized): Secondary | ICD-10-CM | POA: Diagnosis not present

## 2014-08-05 DIAGNOSIS — R270 Ataxia, unspecified: Secondary | ICD-10-CM | POA: Diagnosis not present

## 2014-08-05 DIAGNOSIS — R278 Other lack of coordination: Secondary | ICD-10-CM | POA: Diagnosis not present

## 2014-08-06 DIAGNOSIS — R278 Other lack of coordination: Secondary | ICD-10-CM | POA: Diagnosis not present

## 2014-08-06 DIAGNOSIS — R260 Ataxic gait: Secondary | ICD-10-CM | POA: Diagnosis not present

## 2014-08-06 DIAGNOSIS — M6281 Muscle weakness (generalized): Secondary | ICD-10-CM | POA: Diagnosis not present

## 2014-08-06 DIAGNOSIS — R270 Ataxia, unspecified: Secondary | ICD-10-CM | POA: Diagnosis not present

## 2014-08-08 DIAGNOSIS — M6281 Muscle weakness (generalized): Secondary | ICD-10-CM | POA: Diagnosis not present

## 2014-08-08 DIAGNOSIS — R278 Other lack of coordination: Secondary | ICD-10-CM | POA: Diagnosis not present

## 2014-08-08 DIAGNOSIS — R260 Ataxic gait: Secondary | ICD-10-CM | POA: Diagnosis not present

## 2014-08-08 DIAGNOSIS — R270 Ataxia, unspecified: Secondary | ICD-10-CM | POA: Diagnosis not present

## 2014-08-09 DIAGNOSIS — R270 Ataxia, unspecified: Secondary | ICD-10-CM | POA: Diagnosis not present

## 2014-08-09 DIAGNOSIS — M6281 Muscle weakness (generalized): Secondary | ICD-10-CM | POA: Diagnosis not present

## 2014-08-09 DIAGNOSIS — R260 Ataxic gait: Secondary | ICD-10-CM | POA: Diagnosis not present

## 2014-08-09 DIAGNOSIS — R278 Other lack of coordination: Secondary | ICD-10-CM | POA: Diagnosis not present

## 2014-08-12 DIAGNOSIS — R270 Ataxia, unspecified: Secondary | ICD-10-CM | POA: Diagnosis not present

## 2014-08-12 DIAGNOSIS — R260 Ataxic gait: Secondary | ICD-10-CM | POA: Diagnosis not present

## 2014-08-12 DIAGNOSIS — R278 Other lack of coordination: Secondary | ICD-10-CM | POA: Diagnosis not present

## 2014-08-12 DIAGNOSIS — M6281 Muscle weakness (generalized): Secondary | ICD-10-CM | POA: Diagnosis not present

## 2014-08-13 DIAGNOSIS — M6281 Muscle weakness (generalized): Secondary | ICD-10-CM | POA: Diagnosis not present

## 2014-08-13 DIAGNOSIS — R278 Other lack of coordination: Secondary | ICD-10-CM | POA: Diagnosis not present

## 2014-08-13 DIAGNOSIS — R270 Ataxia, unspecified: Secondary | ICD-10-CM | POA: Diagnosis not present

## 2014-08-13 DIAGNOSIS — R260 Ataxic gait: Secondary | ICD-10-CM | POA: Diagnosis not present

## 2014-08-14 DIAGNOSIS — R278 Other lack of coordination: Secondary | ICD-10-CM | POA: Diagnosis not present

## 2014-08-14 DIAGNOSIS — M6281 Muscle weakness (generalized): Secondary | ICD-10-CM | POA: Diagnosis not present

## 2014-08-14 DIAGNOSIS — R260 Ataxic gait: Secondary | ICD-10-CM | POA: Diagnosis not present

## 2014-08-14 DIAGNOSIS — R270 Ataxia, unspecified: Secondary | ICD-10-CM | POA: Diagnosis not present

## 2014-08-15 ENCOUNTER — Ambulatory Visit: Payer: Medicare Other | Admitting: Neurology

## 2014-08-15 ENCOUNTER — Telehealth: Payer: Self-pay | Admitting: Neurology

## 2014-08-15 DIAGNOSIS — R260 Ataxic gait: Secondary | ICD-10-CM | POA: Diagnosis not present

## 2014-08-15 DIAGNOSIS — R270 Ataxia, unspecified: Secondary | ICD-10-CM | POA: Diagnosis not present

## 2014-08-15 DIAGNOSIS — M6281 Muscle weakness (generalized): Secondary | ICD-10-CM | POA: Diagnosis not present

## 2014-08-15 DIAGNOSIS — R278 Other lack of coordination: Secondary | ICD-10-CM | POA: Diagnosis not present

## 2014-08-15 NOTE — Telephone Encounter (Signed)
This patient did not show for a revisit appointment today. 

## 2014-08-16 DIAGNOSIS — R260 Ataxic gait: Secondary | ICD-10-CM | POA: Diagnosis not present

## 2014-08-16 DIAGNOSIS — R270 Ataxia, unspecified: Secondary | ICD-10-CM | POA: Diagnosis not present

## 2014-08-16 DIAGNOSIS — R278 Other lack of coordination: Secondary | ICD-10-CM | POA: Diagnosis not present

## 2014-08-16 DIAGNOSIS — M6281 Muscle weakness (generalized): Secondary | ICD-10-CM | POA: Diagnosis not present

## 2014-08-19 DIAGNOSIS — M6281 Muscle weakness (generalized): Secondary | ICD-10-CM | POA: Diagnosis not present

## 2014-08-19 DIAGNOSIS — R278 Other lack of coordination: Secondary | ICD-10-CM | POA: Diagnosis not present

## 2014-08-19 DIAGNOSIS — R270 Ataxia, unspecified: Secondary | ICD-10-CM | POA: Diagnosis not present

## 2014-08-19 DIAGNOSIS — Z6828 Body mass index (BMI) 28.0-28.9, adult: Secondary | ICD-10-CM | POA: Diagnosis not present

## 2014-08-19 DIAGNOSIS — R260 Ataxic gait: Secondary | ICD-10-CM | POA: Diagnosis not present

## 2014-08-19 DIAGNOSIS — I498 Other specified cardiac arrhythmias: Secondary | ICD-10-CM | POA: Diagnosis not present

## 2014-08-19 DIAGNOSIS — R001 Bradycardia, unspecified: Secondary | ICD-10-CM | POA: Diagnosis not present

## 2014-08-19 DIAGNOSIS — R030 Elevated blood-pressure reading, without diagnosis of hypertension: Secondary | ICD-10-CM | POA: Diagnosis not present

## 2014-08-20 DIAGNOSIS — R260 Ataxic gait: Secondary | ICD-10-CM | POA: Diagnosis not present

## 2014-08-20 DIAGNOSIS — R270 Ataxia, unspecified: Secondary | ICD-10-CM | POA: Diagnosis not present

## 2014-08-20 DIAGNOSIS — M6281 Muscle weakness (generalized): Secondary | ICD-10-CM | POA: Diagnosis not present

## 2014-08-20 DIAGNOSIS — R278 Other lack of coordination: Secondary | ICD-10-CM | POA: Diagnosis not present

## 2014-08-22 DIAGNOSIS — R270 Ataxia, unspecified: Secondary | ICD-10-CM | POA: Diagnosis not present

## 2014-08-22 DIAGNOSIS — M6281 Muscle weakness (generalized): Secondary | ICD-10-CM | POA: Diagnosis not present

## 2014-08-22 DIAGNOSIS — R260 Ataxic gait: Secondary | ICD-10-CM | POA: Diagnosis not present

## 2014-08-22 DIAGNOSIS — R278 Other lack of coordination: Secondary | ICD-10-CM | POA: Diagnosis not present

## 2014-08-23 DIAGNOSIS — R270 Ataxia, unspecified: Secondary | ICD-10-CM | POA: Diagnosis not present

## 2014-08-23 DIAGNOSIS — R260 Ataxic gait: Secondary | ICD-10-CM | POA: Diagnosis not present

## 2014-08-23 DIAGNOSIS — R278 Other lack of coordination: Secondary | ICD-10-CM | POA: Diagnosis not present

## 2014-08-23 DIAGNOSIS — M6281 Muscle weakness (generalized): Secondary | ICD-10-CM | POA: Diagnosis not present

## 2014-08-26 DIAGNOSIS — M6281 Muscle weakness (generalized): Secondary | ICD-10-CM | POA: Diagnosis not present

## 2014-08-26 DIAGNOSIS — R278 Other lack of coordination: Secondary | ICD-10-CM | POA: Diagnosis not present

## 2014-08-26 DIAGNOSIS — R270 Ataxia, unspecified: Secondary | ICD-10-CM | POA: Diagnosis not present

## 2014-08-26 DIAGNOSIS — R260 Ataxic gait: Secondary | ICD-10-CM | POA: Diagnosis not present

## 2014-08-27 DIAGNOSIS — R278 Other lack of coordination: Secondary | ICD-10-CM | POA: Diagnosis not present

## 2014-08-27 DIAGNOSIS — M6281 Muscle weakness (generalized): Secondary | ICD-10-CM | POA: Diagnosis not present

## 2014-08-27 DIAGNOSIS — R260 Ataxic gait: Secondary | ICD-10-CM | POA: Diagnosis not present

## 2014-08-27 DIAGNOSIS — R270 Ataxia, unspecified: Secondary | ICD-10-CM | POA: Diagnosis not present

## 2014-08-29 ENCOUNTER — Ambulatory Visit: Payer: Medicare Other | Admitting: Internal Medicine

## 2014-08-29 DIAGNOSIS — R260 Ataxic gait: Secondary | ICD-10-CM | POA: Diagnosis not present

## 2014-08-29 DIAGNOSIS — R270 Ataxia, unspecified: Secondary | ICD-10-CM | POA: Diagnosis not present

## 2014-08-29 DIAGNOSIS — R278 Other lack of coordination: Secondary | ICD-10-CM | POA: Diagnosis not present

## 2014-08-29 DIAGNOSIS — M6281 Muscle weakness (generalized): Secondary | ICD-10-CM | POA: Diagnosis not present

## 2014-08-30 DIAGNOSIS — M6281 Muscle weakness (generalized): Secondary | ICD-10-CM | POA: Diagnosis not present

## 2014-08-30 DIAGNOSIS — R278 Other lack of coordination: Secondary | ICD-10-CM | POA: Diagnosis not present

## 2014-08-30 DIAGNOSIS — R260 Ataxic gait: Secondary | ICD-10-CM | POA: Diagnosis not present

## 2014-08-30 DIAGNOSIS — R270 Ataxia, unspecified: Secondary | ICD-10-CM | POA: Diagnosis not present

## 2014-09-02 DIAGNOSIS — R260 Ataxic gait: Secondary | ICD-10-CM | POA: Diagnosis not present

## 2014-09-02 DIAGNOSIS — R278 Other lack of coordination: Secondary | ICD-10-CM | POA: Diagnosis not present

## 2014-09-02 DIAGNOSIS — R270 Ataxia, unspecified: Secondary | ICD-10-CM | POA: Diagnosis not present

## 2014-09-02 DIAGNOSIS — M6281 Muscle weakness (generalized): Secondary | ICD-10-CM | POA: Diagnosis not present

## 2014-09-03 ENCOUNTER — Other Ambulatory Visit: Payer: Self-pay | Admitting: Internal Medicine

## 2014-09-03 DIAGNOSIS — R911 Solitary pulmonary nodule: Secondary | ICD-10-CM

## 2014-09-06 ENCOUNTER — Other Ambulatory Visit: Payer: Medicare Other

## 2014-09-09 ENCOUNTER — Ambulatory Visit
Admission: RE | Admit: 2014-09-09 | Discharge: 2014-09-09 | Disposition: A | Discharge status: Home / Self Care | Payer: Medicare Other | Source: Ambulatory Visit | Attending: Internal Medicine | Admitting: Internal Medicine

## 2014-09-09 DIAGNOSIS — R911 Solitary pulmonary nodule: Secondary | ICD-10-CM

## 2014-09-09 DIAGNOSIS — R918 Other nonspecific abnormal finding of lung field: Secondary | ICD-10-CM | POA: Diagnosis not present

## 2014-09-09 DIAGNOSIS — I2510 Atherosclerotic heart disease of native coronary artery without angina pectoris: Secondary | ICD-10-CM | POA: Diagnosis not present

## 2014-09-09 DIAGNOSIS — K8020 Calculus of gallbladder without cholecystitis without obstruction: Secondary | ICD-10-CM | POA: Diagnosis not present

## 2014-09-11 DIAGNOSIS — R002 Palpitations: Secondary | ICD-10-CM | POA: Diagnosis not present

## 2014-09-11 DIAGNOSIS — R001 Bradycardia, unspecified: Secondary | ICD-10-CM | POA: Diagnosis not present

## 2014-09-11 DIAGNOSIS — R000 Tachycardia, unspecified: Secondary | ICD-10-CM | POA: Diagnosis not present

## 2014-09-11 DIAGNOSIS — Z6828 Body mass index (BMI) 28.0-28.9, adult: Secondary | ICD-10-CM | POA: Diagnosis not present

## 2014-09-18 ENCOUNTER — Encounter: Payer: Self-pay | Admitting: Neurology

## 2014-09-23 ENCOUNTER — Encounter: Payer: Self-pay | Admitting: Internal Medicine

## 2014-09-23 ENCOUNTER — Ambulatory Visit (INDEPENDENT_AMBULATORY_CARE_PROVIDER_SITE_OTHER): Payer: Medicare Other | Admitting: Internal Medicine

## 2014-09-23 VITALS — BP 140/90 | HR 97 | Ht 68.0 in | Wt 187.1 lb

## 2014-09-23 DIAGNOSIS — I10 Essential (primary) hypertension: Secondary | ICD-10-CM

## 2014-09-23 DIAGNOSIS — I493 Ventricular premature depolarization: Secondary | ICD-10-CM

## 2014-09-23 DIAGNOSIS — I471 Supraventricular tachycardia: Secondary | ICD-10-CM

## 2014-09-23 DIAGNOSIS — R001 Bradycardia, unspecified: Secondary | ICD-10-CM

## 2014-09-23 NOTE — Progress Notes (Signed)
Primary Care Physician: Marton Redwood, MD Referring Physician:  Dr Lester Kinsman is a 80 y.o. male with a h/o recurrent PVCs who presents today for cardiology consultation.  He recently was noted to have decreased HR by pulse with dizziness.  EKG by Dr Brigitte Pulse revealed bigeminal PVCs.  Dr Brigitte Pulse planned to stop metoprolol but the patient does not think that this occurred.   He has actually done reasonably well over the past 2-3 weeks.  He feels that he is at his baseline health state today.  He remains active for his age.  He denies any further dizziness.  He has mild SOB chronically.   Today, he denies symptoms of palpitations, chest pain, orthopnea, PND, lower extremity edema, presyncope, syncope, or neurologic sequela. The patient is tolerating medications without difficulties and is otherwise without complaint today.   Past Medical History  Diagnosis Date  . AAA (abdominal aortic aneurysm) without rupture   . Hyperlipidemia   . Diverticulosis   . GERD (gastroesophageal reflux disease)   . Hx of adenomatous colonic polyps   . Peripheral vascular disease   . Obesity   . Dyslipidemia   . History of colon polyps   . History of prostatitis   . Hearing difficulty   . Peripheral neuropathy   . Premature ventricular contraction   . Tachycardia-bradycardia   . CVA (cerebral infarction) 03/2014    right PCA CVA  . Rhabdomyolysis 03/2014    Due to prolonged fall  . Hemorrhoids   . Hiatal hernia 1960  . Macular degeneration   . Lower GI bleed 12/2011    secondary to diverticulosis  . Diverticulosis   . Unsteady gait    Past Surgical History  Procedure Laterality Date  . Knee surgery      left knee in 1970's  . Colonoscopy  01/24/2012    Procedure: COLONOSCOPY;  Surgeon: Lafayette Dragon, MD;  Location: [facility];  Service: Endoscopy;  Laterality: N/A;  . Inguinal hernia repair    . Cataract extraction Bilateral 07/2009, 09/2009  . Tonsillectomy    . Abdominal aortic endovascular  stent graft N/A 08/23/2013    Procedure: ABDOMINAL AORTIC ENDOVASCULAR STENT GRAFT;  Surgeon: Mal Misty, MD;  Location: [place];  Service: Vascular;  Laterality: N/A;  . Abdominal aortic aneurysm repair    . Testicle surgery  1949    Undescended testicle    Current Outpatient Prescriptions  Medication Sig Dispense Refill  . aspirin EC 81 MG EC tablet Take 1 tablet (81 mg total) by mouth daily.    Marland Kitchen CALCIUM CARBONATE-VITAMIN D PO Take 1 tablet by mouth daily.    . DULoxetine (CYMBALTA) 30 MG capsule Take 1 capsule (30 mg total) by mouth daily. 90 capsule 1  . metoprolol tartrate (LOPRESSOR) 25 MG tablet Take 12.5 mg by mouth 2 (two) times daily.     Marland Kitchen omeprazole (PRILOSEC) 20 MG capsule Take 20 mg by mouth daily.     Marland Kitchen OVER THE COUNTER MEDICATION Take 1 tablet by mouth daily. OTC supplement "Macular Protect Plus"     No current facility-administered medications for this visit.    Allergies  Allergen Reactions  . Sulfamethoxazole Other (See Comments)    UNKNOWN    History   Social History  . Marital Status: Married    Spouse Name: N/A    Number of Children: 3  . Years of Education: 16   Occupational History  . Retired    Social History  Main Topics  . Smoking status: Former Smoker -- 5 years    Types: Cigarettes    Quit date: 11/02/1955  . Smokeless tobacco: Never Used  . Alcohol Use: 1.8 oz/week    3 Not specified per week     Comment: 3 drinks per week   (QUIT X 6 MONTHS)  . Drug Use: No  . Sexual Activity: Not on file   Other Topics Concern  . Not on file   Social History Narrative   Retired       Family History  Problem Relation Age of Onset  . Heart disease Father 26    Rheumatic heart  . Alcohol abuse Sister     ROS- Very poor hearing, decreased vision, + sleep apnea,  All other systems are reviewed and negative except as per the HPI above  Physical Exam: Filed Vitals:   09/23/14 0909  BP: 140/90  Pulse: 97  Height: 5\' 8"  (1.727 m)  Weight:  187 lb 1.9 oz (84.877 kg)    GEN- The patient is elderly appearing, alert and oriented x 3 today.   Head- normocephalic, atraumatic Eyes-  Sclera clear, conjunctiva pink Ears- very poor hearing Oropharynx- clear Neck- supple  Lungs- Clear to ausculation bilaterally, normal work of breathing Heart- Regular rate and rhythm, no murmurs, rubs or gallops, PMI not laterally displaced GI- soft, NT, ND, + BS Extremities- no clubbing, cyanosis, or edema MS- age appropriate muscle atrophy Skin- no rash or lesion Psych- euthymic mood, full affect Neuro- strength and sensation are intact  EKG today reveals sinus rhythm wit PACs, inferior infarction, anterolateral infarction, no PVCs today ekg 08/19/14 reveals sinus rhythm with PVCs in bigeminy (PVC morphology is RBB inferior axis) Records from Dr Manya Silvas office are reviewed Epic records including recent hospitalization are reviewed Echo 5/15 is reviewed and reveals preserved EF  Assessment and Plan:  1. PVCs The patient recently had PVCs in a bigeminal pattern.  He is said to have had a heart rate of 40s--> this is almost certainly sinus with PVCs in bigeminy. He is doing better at this time and continues to take low dose metoprolol. Echo 5/15 reveals preserved EF.  PVCs appear to arise from the LV from an anterobasal location.   I would favor a conservative management strategy.  He has no symptoms of ischemia.  I would therefore not recommend ischemic workup at this time. IF his symptoms of dizziness return then we could consider event monitor placement at that time.  Presently, I think that we should avoid this  2. Bradycardia Likely reduced peripheral pulse by bigeminal PVCs.  I think that it is unlikely that he had an actual bradycardia.  I have reviewed EKGs from Dr Brigitte Pulse and do not see any documented episodes of bradycardia.  3. SVT Well controlled No changes  4. htn Stable No change required today   No changes or further workup  planned at this time Return to see me in 6 months

## 2014-09-23 NOTE — Patient Instructions (Signed)
Your physician wants you to follow-up in: 6 months with Dr. Allred. You will receive a reminder letter in the mail two months in advance. If you don't receive a letter, please call our office to schedule the follow-up appointment.  

## 2014-09-24 ENCOUNTER — Encounter: Payer: Self-pay | Admitting: Neurology

## 2014-10-01 DIAGNOSIS — R278 Other lack of coordination: Secondary | ICD-10-CM | POA: Diagnosis not present

## 2014-10-01 DIAGNOSIS — R260 Ataxic gait: Secondary | ICD-10-CM | POA: Diagnosis not present

## 2014-10-01 DIAGNOSIS — R270 Ataxia, unspecified: Secondary | ICD-10-CM | POA: Diagnosis not present

## 2014-10-01 DIAGNOSIS — M6281 Muscle weakness (generalized): Secondary | ICD-10-CM | POA: Diagnosis not present

## 2014-10-02 DIAGNOSIS — R278 Other lack of coordination: Secondary | ICD-10-CM | POA: Diagnosis not present

## 2014-10-02 DIAGNOSIS — R260 Ataxic gait: Secondary | ICD-10-CM | POA: Diagnosis not present

## 2014-10-02 DIAGNOSIS — R270 Ataxia, unspecified: Secondary | ICD-10-CM | POA: Diagnosis not present

## 2014-10-02 DIAGNOSIS — M6281 Muscle weakness (generalized): Secondary | ICD-10-CM | POA: Diagnosis not present

## 2014-10-03 DIAGNOSIS — R278 Other lack of coordination: Secondary | ICD-10-CM | POA: Diagnosis not present

## 2014-10-03 DIAGNOSIS — M6281 Muscle weakness (generalized): Secondary | ICD-10-CM | POA: Diagnosis not present

## 2014-10-03 DIAGNOSIS — R260 Ataxic gait: Secondary | ICD-10-CM | POA: Diagnosis not present

## 2014-10-03 DIAGNOSIS — R270 Ataxia, unspecified: Secondary | ICD-10-CM | POA: Diagnosis not present

## 2014-10-07 DIAGNOSIS — R260 Ataxic gait: Secondary | ICD-10-CM | POA: Diagnosis not present

## 2014-10-07 DIAGNOSIS — R270 Ataxia, unspecified: Secondary | ICD-10-CM | POA: Diagnosis not present

## 2014-10-07 DIAGNOSIS — R278 Other lack of coordination: Secondary | ICD-10-CM | POA: Diagnosis not present

## 2014-10-07 DIAGNOSIS — M6281 Muscle weakness (generalized): Secondary | ICD-10-CM | POA: Diagnosis not present

## 2014-10-08 DIAGNOSIS — R278 Other lack of coordination: Secondary | ICD-10-CM | POA: Diagnosis not present

## 2014-10-08 DIAGNOSIS — R260 Ataxic gait: Secondary | ICD-10-CM | POA: Diagnosis not present

## 2014-10-08 DIAGNOSIS — M6281 Muscle weakness (generalized): Secondary | ICD-10-CM | POA: Diagnosis not present

## 2014-10-08 DIAGNOSIS — R270 Ataxia, unspecified: Secondary | ICD-10-CM | POA: Diagnosis not present

## 2014-10-10 DIAGNOSIS — M6281 Muscle weakness (generalized): Secondary | ICD-10-CM | POA: Diagnosis not present

## 2014-10-10 DIAGNOSIS — R260 Ataxic gait: Secondary | ICD-10-CM | POA: Diagnosis not present

## 2014-10-10 DIAGNOSIS — R278 Other lack of coordination: Secondary | ICD-10-CM | POA: Diagnosis not present

## 2014-10-10 DIAGNOSIS — R270 Ataxia, unspecified: Secondary | ICD-10-CM | POA: Diagnosis not present

## 2014-10-11 DIAGNOSIS — R270 Ataxia, unspecified: Secondary | ICD-10-CM | POA: Diagnosis not present

## 2014-10-11 DIAGNOSIS — M6281 Muscle weakness (generalized): Secondary | ICD-10-CM | POA: Diagnosis not present

## 2014-10-11 DIAGNOSIS — R260 Ataxic gait: Secondary | ICD-10-CM | POA: Diagnosis not present

## 2014-10-11 DIAGNOSIS — R278 Other lack of coordination: Secondary | ICD-10-CM | POA: Diagnosis not present

## 2014-10-14 DIAGNOSIS — M6281 Muscle weakness (generalized): Secondary | ICD-10-CM | POA: Diagnosis not present

## 2014-10-14 DIAGNOSIS — R278 Other lack of coordination: Secondary | ICD-10-CM | POA: Diagnosis not present

## 2014-10-14 DIAGNOSIS — R270 Ataxia, unspecified: Secondary | ICD-10-CM | POA: Diagnosis not present

## 2014-10-14 DIAGNOSIS — R260 Ataxic gait: Secondary | ICD-10-CM | POA: Diagnosis not present

## 2014-10-15 DIAGNOSIS — M6281 Muscle weakness (generalized): Secondary | ICD-10-CM | POA: Diagnosis not present

## 2014-10-15 DIAGNOSIS — R260 Ataxic gait: Secondary | ICD-10-CM | POA: Diagnosis not present

## 2014-10-15 DIAGNOSIS — R270 Ataxia, unspecified: Secondary | ICD-10-CM | POA: Diagnosis not present

## 2014-10-15 DIAGNOSIS — R278 Other lack of coordination: Secondary | ICD-10-CM | POA: Diagnosis not present

## 2014-10-17 DIAGNOSIS — R270 Ataxia, unspecified: Secondary | ICD-10-CM | POA: Diagnosis not present

## 2014-10-17 DIAGNOSIS — M6281 Muscle weakness (generalized): Secondary | ICD-10-CM | POA: Diagnosis not present

## 2014-10-17 DIAGNOSIS — R260 Ataxic gait: Secondary | ICD-10-CM | POA: Diagnosis not present

## 2014-10-17 DIAGNOSIS — R278 Other lack of coordination: Secondary | ICD-10-CM | POA: Diagnosis not present

## 2014-10-18 DIAGNOSIS — M6281 Muscle weakness (generalized): Secondary | ICD-10-CM | POA: Diagnosis not present

## 2014-10-18 DIAGNOSIS — R278 Other lack of coordination: Secondary | ICD-10-CM | POA: Diagnosis not present

## 2014-10-18 DIAGNOSIS — R270 Ataxia, unspecified: Secondary | ICD-10-CM | POA: Diagnosis not present

## 2014-10-18 DIAGNOSIS — R260 Ataxic gait: Secondary | ICD-10-CM | POA: Diagnosis not present

## 2014-10-21 DIAGNOSIS — R270 Ataxia, unspecified: Secondary | ICD-10-CM | POA: Diagnosis not present

## 2014-10-21 DIAGNOSIS — M6281 Muscle weakness (generalized): Secondary | ICD-10-CM | POA: Diagnosis not present

## 2014-10-21 DIAGNOSIS — R278 Other lack of coordination: Secondary | ICD-10-CM | POA: Diagnosis not present

## 2014-10-21 DIAGNOSIS — R260 Ataxic gait: Secondary | ICD-10-CM | POA: Diagnosis not present

## 2014-10-22 DIAGNOSIS — R260 Ataxic gait: Secondary | ICD-10-CM | POA: Diagnosis not present

## 2014-10-22 DIAGNOSIS — M6281 Muscle weakness (generalized): Secondary | ICD-10-CM | POA: Diagnosis not present

## 2014-10-22 DIAGNOSIS — R278 Other lack of coordination: Secondary | ICD-10-CM | POA: Diagnosis not present

## 2014-10-22 DIAGNOSIS — R270 Ataxia, unspecified: Secondary | ICD-10-CM | POA: Diagnosis not present

## 2014-10-24 DIAGNOSIS — R278 Other lack of coordination: Secondary | ICD-10-CM | POA: Diagnosis not present

## 2014-10-24 DIAGNOSIS — R270 Ataxia, unspecified: Secondary | ICD-10-CM | POA: Diagnosis not present

## 2014-10-24 DIAGNOSIS — M6281 Muscle weakness (generalized): Secondary | ICD-10-CM | POA: Diagnosis not present

## 2014-10-24 DIAGNOSIS — R260 Ataxic gait: Secondary | ICD-10-CM | POA: Diagnosis not present

## 2014-10-28 DIAGNOSIS — M6281 Muscle weakness (generalized): Secondary | ICD-10-CM | POA: Diagnosis not present

## 2014-10-28 DIAGNOSIS — R278 Other lack of coordination: Secondary | ICD-10-CM | POA: Diagnosis not present

## 2014-10-28 DIAGNOSIS — R260 Ataxic gait: Secondary | ICD-10-CM | POA: Diagnosis not present

## 2014-10-28 DIAGNOSIS — R270 Ataxia, unspecified: Secondary | ICD-10-CM | POA: Diagnosis not present

## 2014-10-30 DIAGNOSIS — R278 Other lack of coordination: Secondary | ICD-10-CM | POA: Diagnosis not present

## 2014-10-30 DIAGNOSIS — R270 Ataxia, unspecified: Secondary | ICD-10-CM | POA: Diagnosis not present

## 2014-10-30 DIAGNOSIS — M6281 Muscle weakness (generalized): Secondary | ICD-10-CM | POA: Diagnosis not present

## 2014-10-30 DIAGNOSIS — R260 Ataxic gait: Secondary | ICD-10-CM | POA: Diagnosis not present

## 2014-11-05 DIAGNOSIS — R260 Ataxic gait: Secondary | ICD-10-CM | POA: Diagnosis not present

## 2014-11-05 DIAGNOSIS — M6281 Muscle weakness (generalized): Secondary | ICD-10-CM | POA: Diagnosis not present

## 2014-11-05 DIAGNOSIS — R270 Ataxia, unspecified: Secondary | ICD-10-CM | POA: Diagnosis not present

## 2014-11-05 DIAGNOSIS — R278 Other lack of coordination: Secondary | ICD-10-CM | POA: Diagnosis not present

## 2014-11-07 DIAGNOSIS — R278 Other lack of coordination: Secondary | ICD-10-CM | POA: Diagnosis not present

## 2014-11-07 DIAGNOSIS — R260 Ataxic gait: Secondary | ICD-10-CM | POA: Diagnosis not present

## 2014-11-07 DIAGNOSIS — R270 Ataxia, unspecified: Secondary | ICD-10-CM | POA: Diagnosis not present

## 2014-11-07 DIAGNOSIS — M6281 Muscle weakness (generalized): Secondary | ICD-10-CM | POA: Diagnosis not present

## 2014-11-12 DIAGNOSIS — R270 Ataxia, unspecified: Secondary | ICD-10-CM | POA: Diagnosis not present

## 2014-11-12 DIAGNOSIS — M6281 Muscle weakness (generalized): Secondary | ICD-10-CM | POA: Diagnosis not present

## 2014-11-12 DIAGNOSIS — R260 Ataxic gait: Secondary | ICD-10-CM | POA: Diagnosis not present

## 2014-11-12 DIAGNOSIS — R278 Other lack of coordination: Secondary | ICD-10-CM | POA: Diagnosis not present

## 2014-11-14 DIAGNOSIS — M6281 Muscle weakness (generalized): Secondary | ICD-10-CM | POA: Diagnosis not present

## 2014-11-14 DIAGNOSIS — R278 Other lack of coordination: Secondary | ICD-10-CM | POA: Diagnosis not present

## 2014-11-14 DIAGNOSIS — R270 Ataxia, unspecified: Secondary | ICD-10-CM | POA: Diagnosis not present

## 2014-11-14 DIAGNOSIS — R260 Ataxic gait: Secondary | ICD-10-CM | POA: Diagnosis not present

## 2015-01-10 DIAGNOSIS — Z23 Encounter for immunization: Secondary | ICD-10-CM | POA: Diagnosis not present

## 2015-02-04 ENCOUNTER — Encounter: Payer: Self-pay | Admitting: Gastroenterology

## 2015-02-26 DIAGNOSIS — H3531 Nonexudative age-related macular degeneration: Secondary | ICD-10-CM | POA: Diagnosis not present

## 2015-02-26 DIAGNOSIS — H35033 Hypertensive retinopathy, bilateral: Secondary | ICD-10-CM | POA: Diagnosis not present

## 2015-02-26 DIAGNOSIS — H40013 Open angle with borderline findings, low risk, bilateral: Secondary | ICD-10-CM | POA: Diagnosis not present

## 2015-02-26 DIAGNOSIS — H3532 Exudative age-related macular degeneration: Secondary | ICD-10-CM | POA: Diagnosis not present

## 2015-07-26 ENCOUNTER — Emergency Department (HOSPITAL_COMMUNITY)
Admission: EM | Admit: 2015-07-26 | Discharge: 2015-07-26 | Disposition: A | Discharge status: Home / Self Care | Payer: Medicare Other | Attending: Emergency Medicine | Admitting: Emergency Medicine

## 2015-07-26 ENCOUNTER — Emergency Department (HOSPITAL_COMMUNITY): Payer: Medicare Other

## 2015-07-26 DIAGNOSIS — Z7982 Long term (current) use of aspirin: Secondary | ICD-10-CM | POA: Diagnosis not present

## 2015-07-26 DIAGNOSIS — Y998 Other external cause status: Secondary | ICD-10-CM | POA: Insufficient documentation

## 2015-07-26 DIAGNOSIS — M545 Low back pain: Secondary | ICD-10-CM | POA: Diagnosis not present

## 2015-07-26 DIAGNOSIS — W19XXXA Unspecified fall, initial encounter: Secondary | ICD-10-CM

## 2015-07-26 DIAGNOSIS — S3992XA Unspecified injury of lower back, initial encounter: Secondary | ICD-10-CM | POA: Diagnosis present

## 2015-07-26 DIAGNOSIS — S0083XA Contusion of other part of head, initial encounter: Secondary | ICD-10-CM | POA: Diagnosis not present

## 2015-07-26 DIAGNOSIS — E785 Hyperlipidemia, unspecified: Secondary | ICD-10-CM | POA: Insufficient documentation

## 2015-07-26 DIAGNOSIS — R42 Dizziness and giddiness: Secondary | ICD-10-CM | POA: Insufficient documentation

## 2015-07-26 DIAGNOSIS — K219 Gastro-esophageal reflux disease without esophagitis: Secondary | ICD-10-CM | POA: Insufficient documentation

## 2015-07-26 DIAGNOSIS — S22080A Wedge compression fracture of T11-T12 vertebra, initial encounter for closed fracture: Secondary | ICD-10-CM | POA: Insufficient documentation

## 2015-07-26 DIAGNOSIS — I493 Ventricular premature depolarization: Secondary | ICD-10-CM | POA: Diagnosis not present

## 2015-07-26 DIAGNOSIS — Z79899 Other long term (current) drug therapy: Secondary | ICD-10-CM | POA: Diagnosis not present

## 2015-07-26 DIAGNOSIS — Y9289 Other specified places as the place of occurrence of the external cause: Secondary | ICD-10-CM | POA: Insufficient documentation

## 2015-07-26 DIAGNOSIS — H9190 Unspecified hearing loss, unspecified ear: Secondary | ICD-10-CM | POA: Diagnosis not present

## 2015-07-26 DIAGNOSIS — Z8673 Personal history of transient ischemic attack (TIA), and cerebral infarction without residual deficits: Secondary | ICD-10-CM | POA: Insufficient documentation

## 2015-07-26 DIAGNOSIS — E669 Obesity, unspecified: Secondary | ICD-10-CM | POA: Diagnosis not present

## 2015-07-26 DIAGNOSIS — Z87438 Personal history of other diseases of male genital organs: Secondary | ICD-10-CM | POA: Diagnosis not present

## 2015-07-26 DIAGNOSIS — Y9389 Activity, other specified: Secondary | ICD-10-CM | POA: Insufficient documentation

## 2015-07-26 DIAGNOSIS — M542 Cervicalgia: Secondary | ICD-10-CM | POA: Diagnosis not present

## 2015-07-26 DIAGNOSIS — S60011A Contusion of right thumb without damage to nail, initial encounter: Secondary | ICD-10-CM | POA: Insufficient documentation

## 2015-07-26 DIAGNOSIS — S22000A Wedge compression fracture of unspecified thoracic vertebra, initial encounter for closed fracture: Secondary | ICD-10-CM

## 2015-07-26 DIAGNOSIS — M549 Dorsalgia, unspecified: Secondary | ICD-10-CM | POA: Diagnosis not present

## 2015-07-26 DIAGNOSIS — Z86010 Personal history of colonic polyps: Secondary | ICD-10-CM | POA: Diagnosis not present

## 2015-07-26 DIAGNOSIS — W06XXXA Fall from bed, initial encounter: Secondary | ICD-10-CM | POA: Diagnosis not present

## 2015-07-26 DIAGNOSIS — R404 Transient alteration of awareness: Secondary | ICD-10-CM | POA: Diagnosis not present

## 2015-07-26 DIAGNOSIS — R531 Weakness: Secondary | ICD-10-CM | POA: Diagnosis not present

## 2015-07-26 DIAGNOSIS — Z87891 Personal history of nicotine dependence: Secondary | ICD-10-CM | POA: Diagnosis not present

## 2015-07-26 LAB — URINE MICROSCOPIC-ADD ON

## 2015-07-26 LAB — URINALYSIS, ROUTINE W REFLEX MICROSCOPIC
BILIRUBIN URINE: NEGATIVE
Glucose, UA: NEGATIVE mg/dL
Hgb urine dipstick: NEGATIVE
KETONES UR: 40 mg/dL — AB
LEUKOCYTES UA: NEGATIVE
Nitrite: NEGATIVE
PROTEIN: 30 mg/dL — AB
Specific Gravity, Urine: 1.024 (ref 1.005–1.030)
UROBILINOGEN UA: 1 mg/dL (ref 0.0–1.0)
pH: 6 (ref 5.0–8.0)

## 2015-07-26 LAB — CBC WITH DIFFERENTIAL/PLATELET
BASOS ABS: 0 10*3/uL (ref 0.0–0.1)
BASOS PCT: 0 %
Eosinophils Absolute: 0 10*3/uL (ref 0.0–0.7)
Eosinophils Relative: 0 %
HEMATOCRIT: 45.4 % (ref 39.0–52.0)
HEMOGLOBIN: 15.4 g/dL (ref 13.0–17.0)
Lymphocytes Relative: 5 %
Lymphs Abs: 0.6 10*3/uL — ABNORMAL LOW (ref 0.7–4.0)
MCH: 30.3 pg (ref 26.0–34.0)
MCHC: 33.9 g/dL (ref 30.0–36.0)
MCV: 89.2 fL (ref 78.0–100.0)
MONOS PCT: 4 %
Monocytes Absolute: 0.5 10*3/uL (ref 0.1–1.0)
NEUTROS ABS: 10.5 10*3/uL — AB (ref 1.7–7.7)
NEUTROS PCT: 91 %
Platelets: 231 10*3/uL (ref 150–400)
RBC: 5.09 MIL/uL (ref 4.22–5.81)
RDW: 13.9 % (ref 11.5–15.5)
WBC: 11.6 10*3/uL — ABNORMAL HIGH (ref 4.0–10.5)

## 2015-07-26 LAB — BASIC METABOLIC PANEL
ANION GAP: 9 (ref 5–15)
BUN: 16 mg/dL (ref 6–20)
CALCIUM: 9.1 mg/dL (ref 8.9–10.3)
CO2: 24 mmol/L (ref 22–32)
Chloride: 105 mmol/L (ref 101–111)
Creatinine, Ser: 0.83 mg/dL (ref 0.61–1.24)
Glucose, Bld: 124 mg/dL — ABNORMAL HIGH (ref 65–99)
Potassium: 4.2 mmol/L (ref 3.5–5.1)
SODIUM: 138 mmol/L (ref 135–145)

## 2015-07-26 LAB — TROPONIN I

## 2015-07-26 MED ORDER — OXYCODONE-ACETAMINOPHEN 5-325 MG PO TABS: 1.0000 | ORAL_TABLET | Freq: Once | ORAL | Status: DC

## 2015-07-26 MED ORDER — OXYCODONE-ACETAMINOPHEN 5-325 MG PO TABS
1.0000 | ORAL_TABLET | Freq: Once | ORAL | Status: AC
  Administered 2015-07-26: 1 via ORAL
  Filled 2015-07-26: qty 1

## 2015-07-26 MED ORDER — OXYCODONE-ACETAMINOPHEN 5-325 MG PO TABS: 1.0000 | ORAL_TABLET | Freq: Three times a day (TID) | ORAL | Status: DC | PRN

## 2015-07-26 NOTE — ED Notes (Addendum)
Attempted to call report to [facility]. CNA answered and she reports the nurse is not around. This RN asked for the nurses number [phone] was provided and Carl Rios answered. She was informed of DC papers and prescription.

## 2015-07-26 NOTE — ED Notes (Signed)
Pt attempted to ambulate but could not stand with out immense pain. Pt was able to sit on the side of the bed and made several attempts to stand but was not successful

## 2015-07-26 NOTE — ED Notes (Signed)
Delay in lab draw, pt not in room 

## 2015-07-26 NOTE — ED Notes (Signed)
[facility] had been called.

## 2015-07-26 NOTE — Discharge Instructions (Signed)
Back, Compression Fracture °A compression fracture happens when a force is put upon the length of your spine. Slipping and falling on your bottom are examples of such a force. When this happens, sometimes the force is great enough to compress the building blocks (vertebral bodies) of your spine. Although this causes a lot of pain, this can usually be treated at home, unless your caregiver feels hospitalization is needed for pain control. °Your backbone (spinal column) is made up of 24 main vertebral bodies in addition to the sacrum and coccyx (see illustration). These are held together by tough fibrous tissues (ligaments) and by support of your muscles. Nerve roots pass through the openings between the vertebrae. A sudden wrenching move, injury, or a fall may cause a compression fracture of one of the vertebral bodies. This may result in back pain or spread of pain into the belly (abdomen), the buttocks, and down the leg into the foot. Pain may also be created by muscle spasm alone. °Large studies have been undertaken to determine the best possible course of action to help your back following injury and also to prevent future problems. The recommendations are as follows. °FOLLOWING A COMPRESSION FRACTURE: °Do the following only if advised by your caregiver.  °· If a back brace has been suggested or provided, wear it as directed. °· Do not stop wearing the back brace unless instructed by your caregiver. °· When allowed to return to regular activities, avoid a sedentary lifestyle. Actively exercise. Sporadic weekend binges of tennis, racquetball, or waterskiing may actually aggravate or create problems, especially if you are not in condition for that activity. °· Avoid sports requiring sudden body movements until you are in condition for them. Swimming and walking are safer activities. °· Maintain good posture. °· Avoid obesity. °· If not already done, you should have a DEXA scan. Based on the results, be treated for  osteoporosis. °FOLLOWING ACUTE (SUDDEN) INJURY: °· Only take over-the-counter or prescription medicines for pain, discomfort, or fever as directed by your caregiver. °· Use bed rest for only the most extreme acute episode. Prolonged bed rest may aggravate your condition. Ice used for acute conditions is effective. Use a large plastic bag filled with ice. Wrap it in a towel. This also provides excellent pain relief. This may be continuous. Or use it for 30 minutes every 2 hours during acute phase, then as needed. Heat for 30 minutes prior to activities is helpful. °· As soon as the acute phase (the time when your back is too painful for you to do normal activities) is over, it is important to resume normal activities and work hardening programs. Back injuries can cause potentially marked changes in lifestyle. So it is important to attack these problems aggressively. °· See your caregiver for continued problems. He or she can help or refer you for appropriate exercises, physical therapy, and work hardening if needed. °· If you are given narcotic medications for your condition, for the next 24 hours do not: °¨ Drive. °¨ Operate machinery or power tools. °¨ Sign legal documents. °· Do not drink alcohol, or take sleeping pills or other medications that may interfere with treatment. °If your caregiver has given you a follow-up appointment, it is very important to keep that appointment. Not keeping the appointment could result in a chronic or permanent injury, pain, and disability. If there is any problem keeping the appointment, you must call back to this facility for assistance.  °SEEK IMMEDIATE MEDICAL CARE IF: °· You develop numbness,   tingling, weakness, or problems with the use of your arms or legs. °· You develop severe back pain not relieved with medications. °· You have changes in bowel or bladder control. °· You have increasing pain in any areas of the body. °Document Released: 10/18/2005 Document Revised:  03/04/2014 Document Reviewed: 05/22/2008 °Exit[employer]® Patient Information ©2015 [employer]. This information is not intended to replace advice given to you by your health care provider. Make sure you discuss any questions you have with your health care provider. ° °

## 2015-07-26 NOTE — ED Notes (Signed)
Pt aware urine specimen is needed. Urinal provide.

## 2015-07-26 NOTE — ED Notes (Addendum)
PTAR at bedside 

## 2015-07-26 NOTE — ED Notes (Signed)
Bed: OX73 Expected date:  Expected time:  Means of arrival:  Comments: Fall; Back pain

## 2015-07-26 NOTE — ED Notes (Signed)
Pt was able to stand and ambulate in room with assistance. Pt has a walker that he normally uses at home.

## 2015-07-26 NOTE — ED Provider Notes (Signed)
CSN: [id number]     Arrival date & time 07/26/15  1431 History   First MD Initiated Contact with Patient 07/26/15 1513     Chief Complaint  Patient presents with  . Fall  . Back Pain     (Consider location/radiation/quality/duration/timing/severity/associated sxs/prior Treatment) HPI Comments: Pt comes in with c/o lower back pain that started this morning at 2am after a fall when getting out of bed. Pt states that he got dizzy which has been happening since he had a stroke in 2015. No loc. Not on thinners. He states that after about 1 hour he was able to get himself up. Denies feeling dizzy for the rest of today, but he is having back pain. Denies cp or sob. He was not incontinent of urine. He denies numbness or weakness  The history is provided by the patient. No language interpreter was used.    Past Medical History  Diagnosis Date  . AAA (abdominal aortic aneurysm) without rupture   . Hyperlipidemia   . Diverticulosis   . GERD (gastroesophageal reflux disease)   . Hx of adenomatous colonic polyps   . Peripheral vascular disease   . Obesity   . Dyslipidemia   . History of colon polyps   . History of prostatitis   . Hearing difficulty   . Peripheral neuropathy   . Premature ventricular contraction   . Tachycardia-bradycardia   . CVA (cerebral infarction) 03/2014    right PCA CVA  . Rhabdomyolysis 03/2014    Due to prolonged fall  . Hemorrhoids   . Hiatal hernia 1960  . Macular degeneration   . Lower GI bleed 12/2011    secondary to diverticulosis  . Diverticulosis   . Unsteady gait    Past Surgical History  Procedure Laterality Date  . Knee surgery      left knee in 1970's  . Colonoscopy  01/24/2012    Procedure: COLONOSCOPY;  Surgeon: Lafayette Dragon, MD;  Location: [facility];  Service: Endoscopy;  Laterality: N/A;  . Inguinal hernia repair    . Cataract extraction Bilateral 07/2009, 09/2009  . Tonsillectomy    . Abdominal aortic endovascular stent graft N/A  08/23/2013    Procedure: ABDOMINAL AORTIC ENDOVASCULAR STENT GRAFT;  Surgeon: Mal Misty, MD;  Location: [place] [facility];  Service: Vascular;  Laterality: N/A;  . Abdominal aortic aneurysm repair    . Testicle surgery  1949    Undescended testicle   Family History  Problem Relation Age of Onset  . Heart disease Father 82    Rheumatic heart  . Alcohol abuse Sister    Social History  Substance Use Topics  . Smoking status: Former Smoker -- 5 years    Types: Cigarettes    Quit date: 11/02/1955  . Smokeless tobacco: Never Used  . Alcohol Use: 1.8 oz/week    3 Standard drinks or equivalent per week     Comment: 3 drinks per week   (QUIT X 6 MONTHS)    Review of Systems  All other systems reviewed and are negative.     Allergies  Sulfamethoxazole  Home Medications   Prior to Admission medications   Medication Sig Start Date End Date Taking? Authorizing Provider  aspirin EC 81 MG EC tablet Take 1 tablet (81 mg total) by mouth daily. 03/29/14   Marton Redwood, MD  CALCIUM CARBONATE-VITAMIN D PO Take 1 tablet by mouth daily.    Historical Provider, MD  DULoxetine (CYMBALTA) 30 MG capsule Take 1 capsule (30  mg total) by mouth daily. 02/19/14   Kathrynn Ducking, MD  metoprolol tartrate (LOPRESSOR) 25 MG tablet Take 12.5 mg by mouth 2 (two) times daily.  05/22/14   Historical Provider, MD  omeprazole (PRILOSEC) 20 MG capsule Take 20 mg by mouth daily.  05/22/14   Historical Provider, MD  OVER THE COUNTER MEDICATION Take 1 tablet by mouth daily. OTC supplement "Macular Protect Plus"    Historical Provider, MD   BP 151/89 mmHg  Pulse 80  Temp(Src) 97.8 F (36.6 C) (Oral)  Resp 19  SpO2 91% Physical Exam  Constitutional: He is oriented to person, place, and time. He appears well-developed and well-nourished.  Cardiovascular: Normal rate and regular rhythm.   Pulmonary/Chest: Effort normal and breath sounds normal.  Musculoskeletal:       Cervical back: Normal.       Thoracic back: Normal.        Lumbar back: He exhibits bony tenderness.  Bruising noted to the right thumb. Full JKD:TOIZT all extremities without any problem  Neurological: He is alert and oriented to person, place, and time. He exhibits normal muscle tone. Coordination normal.  Skin: Skin is warm and dry.  Nursing note and vitals reviewed.   ED Course  Procedures (including critical care time) Labs Review Labs Reviewed  URINALYSIS, ROUTINE W REFLEX MICROSCOPIC (NOT AT [facility]) - Abnormal; Notable for the following:    Color, Urine AMBER (*)    APPearance HAZY (*)    Ketones, ur 40 (*)    Protein, ur 30 (*)    All other components within normal limits  BASIC METABOLIC PANEL - Abnormal; Notable for the following:    Glucose, Bld 124 (*)    All other components within normal limits  CBC WITH DIFFERENTIAL/PLATELET - Abnormal; Notable for the following:    WBC 11.6 (*)    Neutro Abs 10.5 (*)    Lymphs Abs 0.6 (*)    All other components within normal limits  TROPONIN I  URINE MICROSCOPIC-ADD ON    Imaging Review Dg Lumbar Spine Complete  07/26/2015   CLINICAL DATA:  Fall around 2 a.m. this morning.  Low back pain.  EXAM: LUMBAR SPINE - COMPLETE 4+ VIEW  COMPARISON:  03/28/2014  FINDINGS: Ended graft is noted in the aorta and iliac vessels. Degenerative disc and facet disease throughout the lumbar spine. Mild compression fracture at T12, new since prior study. However, this has a chronic appearance with associated degenerative changes at T11-12. No definite acute fracture.  IMPRESSION: Mild T12 compression fracture, new since prior study but appears chronic with associated degenerative changes.  Diffuse spondylosis.   Electronically Signed   By: Rolm Baptise M.D.   On: 07/26/2015 16:09   Ct Head Wo Contrast  07/26/2015   CLINICAL DATA:  Fall, dizziness, forehead bruising, pain  EXAM: CT HEAD WITHOUT CONTRAST  CT CERVICAL SPINE WITHOUT CONTRAST  TECHNIQUE: Multidetector CT imaging of the head and cervical spine  was performed following the standard protocol without intravenous contrast. Multiplanar CT image reconstructions of the cervical spine were also generated.  COMPARISON:  MRI brain dated 03/29/2014.  CT head dated 03/26/2014.  FINDINGS: CT HEAD FINDINGS  No evidence of parenchymal hemorrhage or extra-axial fluid collection. No mass lesion, mass effect, or midline shift.  No CT evidence of acute infarction.  Old right occipital infarct.  Old right thalamic lacunar infarct.  Extensive small vessel ischemic changes. Mild intracranial atherosclerosis.  Global cortical and central atrophy.  No ventriculomegaly.  The visualized paranasal sinuses are essentially clear. The mastoid air cells are unopacified.  No evidence of calvarial fracture.  CT CERVICAL SPINE FINDINGS  Normal cervical lordosis.  No evidence of fracture dislocation. Vertebral body heights are maintained. Dens appears intact.  No prevertebral soft tissue swelling.  Mild multilevel degenerative changes.  Visualized thyroid is enlarged/heterogeneous.  Visualized lung apices are notable for biapical pleural parenchymal scarring with paraseptal emphysematous changes.  IMPRESSION: No evidence of acute intracranial abnormality.  Old right occipital infarct and right thalamic lacunar infarct. Atrophy with extensive small vessel ischemic changes.  No evidence of traumatic injury to the cervical spine. Mild multilevel degenerative changes.   Electronically Signed   By: Julian Hy M.D.   On: 07/26/2015 15:44   Ct Cervical Spine Wo Contrast  07/26/2015   CLINICAL DATA:  Fall, dizziness, forehead bruising, pain  EXAM: CT HEAD WITHOUT CONTRAST  CT CERVICAL SPINE WITHOUT CONTRAST  TECHNIQUE: Multidetector CT imaging of the head and cervical spine was performed following the standard protocol without intravenous contrast. Multiplanar CT image reconstructions of the cervical spine were also generated.  COMPARISON:  MRI brain dated 03/29/2014.  CT head dated  03/26/2014.  FINDINGS: CT HEAD FINDINGS  No evidence of parenchymal hemorrhage or extra-axial fluid collection. No mass lesion, mass effect, or midline shift.  No CT evidence of acute infarction.  Old right occipital infarct.  Old right thalamic lacunar infarct.  Extensive small vessel ischemic changes. Mild intracranial atherosclerosis.  Global cortical and central atrophy.  No ventriculomegaly.  The visualized paranasal sinuses are essentially clear. The mastoid air cells are unopacified.  No evidence of calvarial fracture.  CT CERVICAL SPINE FINDINGS  Normal cervical lordosis.  No evidence of fracture dislocation. Vertebral body heights are maintained. Dens appears intact.  No prevertebral soft tissue swelling.  Mild multilevel degenerative changes.  Visualized thyroid is enlarged/heterogeneous.  Visualized lung apices are notable for biapical pleural parenchymal scarring with paraseptal emphysematous changes.  IMPRESSION: No evidence of acute intracranial abnormality.  Old right occipital infarct and right thalamic lacunar infarct. Atrophy with extensive small vessel ischemic changes.  No evidence of traumatic injury to the cervical spine. Mild multilevel degenerative changes.   Electronically Signed   By: Julian Hy M.D.   On: 07/26/2015 15:44   Dg Hand Complete Right  07/26/2015   CLINICAL DATA:  Fall this morning, bruise on the right thumb.  EXAM: RIGHT HAND - COMPLETE 3+ VIEW  COMPARISON:  None.  FINDINGS: Degenerative changes within the first carpometacarpal joint and IP joints. No acute bony abnormality. Specifically, no fracture, subluxation, or dislocation. Soft tissues are intact.  IMPRESSION: No acute bony abnormality.   Electronically Signed   By: Rolm Baptise M.D.   On: 07/26/2015 16:07   I have personally reviewed and evaluated these images and lab results as part of my medical decision-making.   EKG Interpretation   Date/Time:  Saturday July 26 2015 15:19:01 EDT Ventricular  Rate:  98 PR Interval:  178 QRS Duration: 100 QT Interval:  419 QTC Calculation: 535 R Axis:   -64 Text Interpretation:  Sinus rhythm Ventricular bigeminy Inferior infarct,  old Anteroseptal infarct, age indeterminate Confirmed by Chanae Gemma  MD,  NATHAN [phone]) on 07/26/2015 3:27:13 PM      MDM   Final diagnoses:  Fall, initial encounter  PVC (premature ventricular contraction)  Thoracic compression fracture, closed, initial encounter    Compression fracture not consistent with where pt tenderness and pain is. Was told by staff  that pt couldn't stand. i personally helped pt ambulate.Pt ambulated with assistance as pt states that sometimes he needs a walker. Pt continuing to have pain. No loc. Think fall related to chronic dizziness. Will send home with pain medication. Pt in agreement with plan.    Glendell Docker, NP 07/26/15 7414  Davonna Belling, MD 07/26/15 [phone]

## 2015-07-26 NOTE — ED Notes (Signed)
Pt from [place] at [facility] via EMS c/o back pain post fall. Pt reports that he fell around 2 am this morning after getting up to use restroom. Pt sts that he was dizzy, fell onto floor and thinks that he may have hit his head but is unsure. Pt has small bruise to forehead. Pt sts that he was able to get up on his own and denies LOC. Pt fall was unwitnessed and he did not report to staff until he woke up later this am. Pt c/o lower back pain since fall. Pt is hard of hearing and left hearing aids at facility. Pt is A&O and in NAD

## 2015-07-26 NOTE — ED Notes (Signed)
Nurse drawing labs. 

## 2015-07-31 DIAGNOSIS — M545 Low back pain: Secondary | ICD-10-CM | POA: Diagnosis not present

## 2015-07-31 DIAGNOSIS — M6281 Muscle weakness (generalized): Secondary | ICD-10-CM | POA: Diagnosis not present

## 2015-07-31 DIAGNOSIS — R278 Other lack of coordination: Secondary | ICD-10-CM | POA: Diagnosis not present

## 2015-08-01 DIAGNOSIS — M545 Low back pain: Secondary | ICD-10-CM | POA: Diagnosis not present

## 2015-08-01 DIAGNOSIS — R278 Other lack of coordination: Secondary | ICD-10-CM | POA: Diagnosis not present

## 2015-08-01 DIAGNOSIS — M6281 Muscle weakness (generalized): Secondary | ICD-10-CM | POA: Diagnosis not present

## 2015-08-04 DIAGNOSIS — M545 Low back pain: Secondary | ICD-10-CM | POA: Diagnosis not present

## 2015-08-04 DIAGNOSIS — M6281 Muscle weakness (generalized): Secondary | ICD-10-CM | POA: Diagnosis not present

## 2015-08-04 DIAGNOSIS — R2681 Unsteadiness on feet: Secondary | ICD-10-CM | POA: Diagnosis not present

## 2015-08-04 DIAGNOSIS — Z9181 History of falling: Secondary | ICD-10-CM | POA: Diagnosis not present

## 2015-08-04 DIAGNOSIS — R278 Other lack of coordination: Secondary | ICD-10-CM | POA: Diagnosis not present

## 2015-08-05 DIAGNOSIS — R278 Other lack of coordination: Secondary | ICD-10-CM | POA: Diagnosis not present

## 2015-08-05 DIAGNOSIS — M6281 Muscle weakness (generalized): Secondary | ICD-10-CM | POA: Diagnosis not present

## 2015-08-05 DIAGNOSIS — Z9181 History of falling: Secondary | ICD-10-CM | POA: Diagnosis not present

## 2015-08-05 DIAGNOSIS — R2681 Unsteadiness on feet: Secondary | ICD-10-CM | POA: Diagnosis not present

## 2015-08-05 DIAGNOSIS — M545 Low back pain: Secondary | ICD-10-CM | POA: Diagnosis not present

## 2015-08-06 DIAGNOSIS — Z9181 History of falling: Secondary | ICD-10-CM | POA: Diagnosis not present

## 2015-08-06 DIAGNOSIS — M6281 Muscle weakness (generalized): Secondary | ICD-10-CM | POA: Diagnosis not present

## 2015-08-06 DIAGNOSIS — R278 Other lack of coordination: Secondary | ICD-10-CM | POA: Diagnosis not present

## 2015-08-06 DIAGNOSIS — R2681 Unsteadiness on feet: Secondary | ICD-10-CM | POA: Diagnosis not present

## 2015-08-06 DIAGNOSIS — M545 Low back pain: Secondary | ICD-10-CM | POA: Diagnosis not present

## 2015-08-07 DIAGNOSIS — M545 Low back pain: Secondary | ICD-10-CM | POA: Diagnosis not present

## 2015-08-07 DIAGNOSIS — R2681 Unsteadiness on feet: Secondary | ICD-10-CM | POA: Diagnosis not present

## 2015-08-07 DIAGNOSIS — R278 Other lack of coordination: Secondary | ICD-10-CM | POA: Diagnosis not present

## 2015-08-07 DIAGNOSIS — Z9181 History of falling: Secondary | ICD-10-CM | POA: Diagnosis not present

## 2015-08-07 DIAGNOSIS — M6281 Muscle weakness (generalized): Secondary | ICD-10-CM | POA: Diagnosis not present

## 2015-08-08 DIAGNOSIS — M6281 Muscle weakness (generalized): Secondary | ICD-10-CM | POA: Diagnosis not present

## 2015-08-08 DIAGNOSIS — M545 Low back pain: Secondary | ICD-10-CM | POA: Diagnosis not present

## 2015-08-08 DIAGNOSIS — R2681 Unsteadiness on feet: Secondary | ICD-10-CM | POA: Diagnosis not present

## 2015-08-08 DIAGNOSIS — R278 Other lack of coordination: Secondary | ICD-10-CM | POA: Diagnosis not present

## 2015-08-08 DIAGNOSIS — Z9181 History of falling: Secondary | ICD-10-CM | POA: Diagnosis not present

## 2015-08-10 DIAGNOSIS — M6281 Muscle weakness (generalized): Secondary | ICD-10-CM | POA: Diagnosis not present

## 2015-08-10 DIAGNOSIS — R2681 Unsteadiness on feet: Secondary | ICD-10-CM | POA: Diagnosis not present

## 2015-08-10 DIAGNOSIS — Z9181 History of falling: Secondary | ICD-10-CM | POA: Diagnosis not present

## 2015-08-10 DIAGNOSIS — R278 Other lack of coordination: Secondary | ICD-10-CM | POA: Diagnosis not present

## 2015-08-10 DIAGNOSIS — M545 Low back pain: Secondary | ICD-10-CM | POA: Diagnosis not present

## 2015-08-11 DIAGNOSIS — M6281 Muscle weakness (generalized): Secondary | ICD-10-CM | POA: Diagnosis not present

## 2015-08-11 DIAGNOSIS — R2681 Unsteadiness on feet: Secondary | ICD-10-CM | POA: Diagnosis not present

## 2015-08-11 DIAGNOSIS — R278 Other lack of coordination: Secondary | ICD-10-CM | POA: Diagnosis not present

## 2015-08-11 DIAGNOSIS — M545 Low back pain: Secondary | ICD-10-CM | POA: Diagnosis not present

## 2015-08-11 DIAGNOSIS — Z9181 History of falling: Secondary | ICD-10-CM | POA: Diagnosis not present

## 2015-08-12 DIAGNOSIS — R2681 Unsteadiness on feet: Secondary | ICD-10-CM | POA: Diagnosis not present

## 2015-08-12 DIAGNOSIS — Z9181 History of falling: Secondary | ICD-10-CM | POA: Diagnosis not present

## 2015-08-12 DIAGNOSIS — R278 Other lack of coordination: Secondary | ICD-10-CM | POA: Diagnosis not present

## 2015-08-12 DIAGNOSIS — M6281 Muscle weakness (generalized): Secondary | ICD-10-CM | POA: Diagnosis not present

## 2015-08-12 DIAGNOSIS — M545 Low back pain: Secondary | ICD-10-CM | POA: Diagnosis not present

## 2015-08-13 DIAGNOSIS — Z9181 History of falling: Secondary | ICD-10-CM | POA: Diagnosis not present

## 2015-08-13 DIAGNOSIS — R278 Other lack of coordination: Secondary | ICD-10-CM | POA: Diagnosis not present

## 2015-08-13 DIAGNOSIS — M6281 Muscle weakness (generalized): Secondary | ICD-10-CM | POA: Diagnosis not present

## 2015-08-13 DIAGNOSIS — R2681 Unsteadiness on feet: Secondary | ICD-10-CM | POA: Diagnosis not present

## 2015-08-13 DIAGNOSIS — M545 Low back pain: Secondary | ICD-10-CM | POA: Diagnosis not present

## 2015-08-14 DIAGNOSIS — M545 Low back pain: Secondary | ICD-10-CM | POA: Diagnosis not present

## 2015-08-14 DIAGNOSIS — R2681 Unsteadiness on feet: Secondary | ICD-10-CM | POA: Diagnosis not present

## 2015-08-14 DIAGNOSIS — M6281 Muscle weakness (generalized): Secondary | ICD-10-CM | POA: Diagnosis not present

## 2015-08-14 DIAGNOSIS — R278 Other lack of coordination: Secondary | ICD-10-CM | POA: Diagnosis not present

## 2015-08-14 DIAGNOSIS — Z9181 History of falling: Secondary | ICD-10-CM | POA: Diagnosis not present

## 2015-08-15 DIAGNOSIS — R278 Other lack of coordination: Secondary | ICD-10-CM | POA: Diagnosis not present

## 2015-08-15 DIAGNOSIS — M545 Low back pain: Secondary | ICD-10-CM | POA: Diagnosis not present

## 2015-08-15 DIAGNOSIS — Z9181 History of falling: Secondary | ICD-10-CM | POA: Diagnosis not present

## 2015-08-15 DIAGNOSIS — M6281 Muscle weakness (generalized): Secondary | ICD-10-CM | POA: Diagnosis not present

## 2015-08-15 DIAGNOSIS — R2681 Unsteadiness on feet: Secondary | ICD-10-CM | POA: Diagnosis not present

## 2015-08-18 DIAGNOSIS — M6281 Muscle weakness (generalized): Secondary | ICD-10-CM | POA: Diagnosis not present

## 2015-08-18 DIAGNOSIS — R2681 Unsteadiness on feet: Secondary | ICD-10-CM | POA: Diagnosis not present

## 2015-08-18 DIAGNOSIS — M545 Low back pain: Secondary | ICD-10-CM | POA: Diagnosis not present

## 2015-08-18 DIAGNOSIS — Z9181 History of falling: Secondary | ICD-10-CM | POA: Diagnosis not present

## 2015-08-18 DIAGNOSIS — R278 Other lack of coordination: Secondary | ICD-10-CM | POA: Diagnosis not present

## 2015-08-20 DIAGNOSIS — M6281 Muscle weakness (generalized): Secondary | ICD-10-CM | POA: Diagnosis not present

## 2015-08-20 DIAGNOSIS — Z9181 History of falling: Secondary | ICD-10-CM | POA: Diagnosis not present

## 2015-08-20 DIAGNOSIS — R278 Other lack of coordination: Secondary | ICD-10-CM | POA: Diagnosis not present

## 2015-08-20 DIAGNOSIS — R2681 Unsteadiness on feet: Secondary | ICD-10-CM | POA: Diagnosis not present

## 2015-08-20 DIAGNOSIS — M545 Low back pain: Secondary | ICD-10-CM | POA: Diagnosis not present

## 2015-08-21 DIAGNOSIS — R278 Other lack of coordination: Secondary | ICD-10-CM | POA: Diagnosis not present

## 2015-08-21 DIAGNOSIS — R2681 Unsteadiness on feet: Secondary | ICD-10-CM | POA: Diagnosis not present

## 2015-08-21 DIAGNOSIS — M545 Low back pain: Secondary | ICD-10-CM | POA: Diagnosis not present

## 2015-08-21 DIAGNOSIS — Z9181 History of falling: Secondary | ICD-10-CM | POA: Diagnosis not present

## 2015-08-21 DIAGNOSIS — M6281 Muscle weakness (generalized): Secondary | ICD-10-CM | POA: Diagnosis not present

## 2015-08-22 DIAGNOSIS — Z9181 History of falling: Secondary | ICD-10-CM | POA: Diagnosis not present

## 2015-08-22 DIAGNOSIS — R2681 Unsteadiness on feet: Secondary | ICD-10-CM | POA: Diagnosis not present

## 2015-08-22 DIAGNOSIS — M545 Low back pain: Secondary | ICD-10-CM | POA: Diagnosis not present

## 2015-08-22 DIAGNOSIS — M6281 Muscle weakness (generalized): Secondary | ICD-10-CM | POA: Diagnosis not present

## 2015-08-22 DIAGNOSIS — R278 Other lack of coordination: Secondary | ICD-10-CM | POA: Diagnosis not present

## 2015-08-25 DIAGNOSIS — M545 Low back pain: Secondary | ICD-10-CM | POA: Diagnosis not present

## 2015-08-25 DIAGNOSIS — R2681 Unsteadiness on feet: Secondary | ICD-10-CM | POA: Diagnosis not present

## 2015-08-25 DIAGNOSIS — R278 Other lack of coordination: Secondary | ICD-10-CM | POA: Diagnosis not present

## 2015-08-25 DIAGNOSIS — Z9181 History of falling: Secondary | ICD-10-CM | POA: Diagnosis not present

## 2015-08-25 DIAGNOSIS — M6281 Muscle weakness (generalized): Secondary | ICD-10-CM | POA: Diagnosis not present

## 2015-08-26 DIAGNOSIS — R2681 Unsteadiness on feet: Secondary | ICD-10-CM | POA: Diagnosis not present

## 2015-08-26 DIAGNOSIS — M545 Low back pain: Secondary | ICD-10-CM | POA: Diagnosis not present

## 2015-08-26 DIAGNOSIS — Z9181 History of falling: Secondary | ICD-10-CM | POA: Diagnosis not present

## 2015-08-26 DIAGNOSIS — R278 Other lack of coordination: Secondary | ICD-10-CM | POA: Diagnosis not present

## 2015-08-26 DIAGNOSIS — M6281 Muscle weakness (generalized): Secondary | ICD-10-CM | POA: Diagnosis not present

## 2015-08-28 DIAGNOSIS — R2681 Unsteadiness on feet: Secondary | ICD-10-CM | POA: Diagnosis not present

## 2015-08-28 DIAGNOSIS — M6281 Muscle weakness (generalized): Secondary | ICD-10-CM | POA: Diagnosis not present

## 2015-08-28 DIAGNOSIS — Z9181 History of falling: Secondary | ICD-10-CM | POA: Diagnosis not present

## 2015-08-28 DIAGNOSIS — M545 Low back pain: Secondary | ICD-10-CM | POA: Diagnosis not present

## 2015-08-28 DIAGNOSIS — R278 Other lack of coordination: Secondary | ICD-10-CM | POA: Diagnosis not present

## 2015-08-29 DIAGNOSIS — M6281 Muscle weakness (generalized): Secondary | ICD-10-CM | POA: Diagnosis not present

## 2015-08-29 DIAGNOSIS — Z9181 History of falling: Secondary | ICD-10-CM | POA: Diagnosis not present

## 2015-08-29 DIAGNOSIS — M545 Low back pain: Secondary | ICD-10-CM | POA: Diagnosis not present

## 2015-08-29 DIAGNOSIS — R2681 Unsteadiness on feet: Secondary | ICD-10-CM | POA: Diagnosis not present

## 2015-08-29 DIAGNOSIS — R278 Other lack of coordination: Secondary | ICD-10-CM | POA: Diagnosis not present

## 2015-09-01 DIAGNOSIS — M6281 Muscle weakness (generalized): Secondary | ICD-10-CM | POA: Diagnosis not present

## 2015-09-01 DIAGNOSIS — R2681 Unsteadiness on feet: Secondary | ICD-10-CM | POA: Diagnosis not present

## 2015-09-01 DIAGNOSIS — Z9181 History of falling: Secondary | ICD-10-CM | POA: Diagnosis not present

## 2015-09-01 DIAGNOSIS — M545 Low back pain: Secondary | ICD-10-CM | POA: Diagnosis not present

## 2015-09-01 DIAGNOSIS — R278 Other lack of coordination: Secondary | ICD-10-CM | POA: Diagnosis not present

## 2015-09-02 DIAGNOSIS — R2681 Unsteadiness on feet: Secondary | ICD-10-CM | POA: Diagnosis not present

## 2015-09-02 DIAGNOSIS — M6281 Muscle weakness (generalized): Secondary | ICD-10-CM | POA: Diagnosis not present

## 2015-09-02 DIAGNOSIS — R278 Other lack of coordination: Secondary | ICD-10-CM | POA: Diagnosis not present

## 2015-09-02 DIAGNOSIS — M545 Low back pain: Secondary | ICD-10-CM | POA: Diagnosis not present

## 2015-09-02 DIAGNOSIS — Z9181 History of falling: Secondary | ICD-10-CM | POA: Diagnosis not present

## 2015-09-03 DIAGNOSIS — R2681 Unsteadiness on feet: Secondary | ICD-10-CM | POA: Diagnosis not present

## 2015-09-03 DIAGNOSIS — M6281 Muscle weakness (generalized): Secondary | ICD-10-CM | POA: Diagnosis not present

## 2015-09-03 DIAGNOSIS — Z9181 History of falling: Secondary | ICD-10-CM | POA: Diagnosis not present

## 2015-09-03 DIAGNOSIS — R278 Other lack of coordination: Secondary | ICD-10-CM | POA: Diagnosis not present

## 2015-09-03 DIAGNOSIS — M545 Low back pain: Secondary | ICD-10-CM | POA: Diagnosis not present

## 2015-09-04 DIAGNOSIS — M545 Low back pain: Secondary | ICD-10-CM | POA: Diagnosis not present

## 2015-09-04 DIAGNOSIS — Z9181 History of falling: Secondary | ICD-10-CM | POA: Diagnosis not present

## 2015-09-04 DIAGNOSIS — R278 Other lack of coordination: Secondary | ICD-10-CM | POA: Diagnosis not present

## 2015-09-04 DIAGNOSIS — R2681 Unsteadiness on feet: Secondary | ICD-10-CM | POA: Diagnosis not present

## 2015-09-04 DIAGNOSIS — M6281 Muscle weakness (generalized): Secondary | ICD-10-CM | POA: Diagnosis not present

## 2015-09-05 DIAGNOSIS — Z9181 History of falling: Secondary | ICD-10-CM | POA: Diagnosis not present

## 2015-09-05 DIAGNOSIS — M545 Low back pain: Secondary | ICD-10-CM | POA: Diagnosis not present

## 2015-09-05 DIAGNOSIS — M6281 Muscle weakness (generalized): Secondary | ICD-10-CM | POA: Diagnosis not present

## 2015-09-05 DIAGNOSIS — R2681 Unsteadiness on feet: Secondary | ICD-10-CM | POA: Diagnosis not present

## 2015-09-05 DIAGNOSIS — R278 Other lack of coordination: Secondary | ICD-10-CM | POA: Diagnosis not present

## 2015-09-08 DIAGNOSIS — M545 Low back pain: Secondary | ICD-10-CM | POA: Diagnosis not present

## 2015-09-08 DIAGNOSIS — R278 Other lack of coordination: Secondary | ICD-10-CM | POA: Diagnosis not present

## 2015-09-08 DIAGNOSIS — R2681 Unsteadiness on feet: Secondary | ICD-10-CM | POA: Diagnosis not present

## 2015-09-08 DIAGNOSIS — M6281 Muscle weakness (generalized): Secondary | ICD-10-CM | POA: Diagnosis not present

## 2015-09-08 DIAGNOSIS — Z9181 History of falling: Secondary | ICD-10-CM | POA: Diagnosis not present

## 2015-09-09 DIAGNOSIS — M545 Low back pain: Secondary | ICD-10-CM | POA: Diagnosis not present

## 2015-09-09 DIAGNOSIS — Z9181 History of falling: Secondary | ICD-10-CM | POA: Diagnosis not present

## 2015-09-09 DIAGNOSIS — R278 Other lack of coordination: Secondary | ICD-10-CM | POA: Diagnosis not present

## 2015-09-09 DIAGNOSIS — R2681 Unsteadiness on feet: Secondary | ICD-10-CM | POA: Diagnosis not present

## 2015-09-09 DIAGNOSIS — M6281 Muscle weakness (generalized): Secondary | ICD-10-CM | POA: Diagnosis not present

## 2015-09-10 DIAGNOSIS — M6281 Muscle weakness (generalized): Secondary | ICD-10-CM | POA: Diagnosis not present

## 2015-09-10 DIAGNOSIS — R278 Other lack of coordination: Secondary | ICD-10-CM | POA: Diagnosis not present

## 2015-09-10 DIAGNOSIS — R2681 Unsteadiness on feet: Secondary | ICD-10-CM | POA: Diagnosis not present

## 2015-09-10 DIAGNOSIS — M545 Low back pain: Secondary | ICD-10-CM | POA: Diagnosis not present

## 2015-09-10 DIAGNOSIS — Z9181 History of falling: Secondary | ICD-10-CM | POA: Diagnosis not present

## 2015-09-11 DIAGNOSIS — M545 Low back pain: Secondary | ICD-10-CM | POA: Diagnosis not present

## 2015-09-11 DIAGNOSIS — M6281 Muscle weakness (generalized): Secondary | ICD-10-CM | POA: Diagnosis not present

## 2015-09-11 DIAGNOSIS — Z9181 History of falling: Secondary | ICD-10-CM | POA: Diagnosis not present

## 2015-09-11 DIAGNOSIS — R2681 Unsteadiness on feet: Secondary | ICD-10-CM | POA: Diagnosis not present

## 2015-09-11 DIAGNOSIS — R278 Other lack of coordination: Secondary | ICD-10-CM | POA: Diagnosis not present

## 2015-09-12 DIAGNOSIS — R2681 Unsteadiness on feet: Secondary | ICD-10-CM | POA: Diagnosis not present

## 2015-09-12 DIAGNOSIS — R278 Other lack of coordination: Secondary | ICD-10-CM | POA: Diagnosis not present

## 2015-09-12 DIAGNOSIS — Z9181 History of falling: Secondary | ICD-10-CM | POA: Diagnosis not present

## 2015-09-12 DIAGNOSIS — M545 Low back pain: Secondary | ICD-10-CM | POA: Diagnosis not present

## 2015-09-12 DIAGNOSIS — M6281 Muscle weakness (generalized): Secondary | ICD-10-CM | POA: Diagnosis not present

## 2015-09-15 DIAGNOSIS — R2681 Unsteadiness on feet: Secondary | ICD-10-CM | POA: Diagnosis not present

## 2015-09-15 DIAGNOSIS — M545 Low back pain: Secondary | ICD-10-CM | POA: Diagnosis not present

## 2015-09-15 DIAGNOSIS — Z9181 History of falling: Secondary | ICD-10-CM | POA: Diagnosis not present

## 2015-09-15 DIAGNOSIS — R278 Other lack of coordination: Secondary | ICD-10-CM | POA: Diagnosis not present

## 2015-09-15 DIAGNOSIS — M6281 Muscle weakness (generalized): Secondary | ICD-10-CM | POA: Diagnosis not present

## 2015-09-16 DIAGNOSIS — M545 Low back pain: Secondary | ICD-10-CM | POA: Diagnosis not present

## 2015-09-16 DIAGNOSIS — R2681 Unsteadiness on feet: Secondary | ICD-10-CM | POA: Diagnosis not present

## 2015-09-16 DIAGNOSIS — M6281 Muscle weakness (generalized): Secondary | ICD-10-CM | POA: Diagnosis not present

## 2015-09-16 DIAGNOSIS — Z9181 History of falling: Secondary | ICD-10-CM | POA: Diagnosis not present

## 2015-09-16 DIAGNOSIS — R278 Other lack of coordination: Secondary | ICD-10-CM | POA: Diagnosis not present

## 2015-09-17 DIAGNOSIS — M6281 Muscle weakness (generalized): Secondary | ICD-10-CM | POA: Diagnosis not present

## 2015-09-17 DIAGNOSIS — R2681 Unsteadiness on feet: Secondary | ICD-10-CM | POA: Diagnosis not present

## 2015-09-17 DIAGNOSIS — Z9181 History of falling: Secondary | ICD-10-CM | POA: Diagnosis not present

## 2015-09-17 DIAGNOSIS — R278 Other lack of coordination: Secondary | ICD-10-CM | POA: Diagnosis not present

## 2015-09-17 DIAGNOSIS — M545 Low back pain: Secondary | ICD-10-CM | POA: Diagnosis not present

## 2015-09-18 DIAGNOSIS — M545 Low back pain: Secondary | ICD-10-CM | POA: Diagnosis not present

## 2015-09-18 DIAGNOSIS — M6281 Muscle weakness (generalized): Secondary | ICD-10-CM | POA: Diagnosis not present

## 2015-09-18 DIAGNOSIS — Z9181 History of falling: Secondary | ICD-10-CM | POA: Diagnosis not present

## 2015-09-18 DIAGNOSIS — R2681 Unsteadiness on feet: Secondary | ICD-10-CM | POA: Diagnosis not present

## 2015-09-18 DIAGNOSIS — R278 Other lack of coordination: Secondary | ICD-10-CM | POA: Diagnosis not present

## 2015-09-19 DIAGNOSIS — R2681 Unsteadiness on feet: Secondary | ICD-10-CM | POA: Diagnosis not present

## 2015-09-19 DIAGNOSIS — M6281 Muscle weakness (generalized): Secondary | ICD-10-CM | POA: Diagnosis not present

## 2015-09-19 DIAGNOSIS — Z9181 History of falling: Secondary | ICD-10-CM | POA: Diagnosis not present

## 2015-09-19 DIAGNOSIS — R278 Other lack of coordination: Secondary | ICD-10-CM | POA: Diagnosis not present

## 2015-09-19 DIAGNOSIS — M545 Low back pain: Secondary | ICD-10-CM | POA: Diagnosis not present

## 2015-09-22 DIAGNOSIS — M545 Low back pain: Secondary | ICD-10-CM | POA: Diagnosis not present

## 2015-09-22 DIAGNOSIS — M6281 Muscle weakness (generalized): Secondary | ICD-10-CM | POA: Diagnosis not present

## 2015-09-22 DIAGNOSIS — R2681 Unsteadiness on feet: Secondary | ICD-10-CM | POA: Diagnosis not present

## 2015-09-22 DIAGNOSIS — R278 Other lack of coordination: Secondary | ICD-10-CM | POA: Diagnosis not present

## 2015-09-22 DIAGNOSIS — Z9181 History of falling: Secondary | ICD-10-CM | POA: Diagnosis not present

## 2015-09-23 DIAGNOSIS — R278 Other lack of coordination: Secondary | ICD-10-CM | POA: Diagnosis not present

## 2015-09-23 DIAGNOSIS — M6281 Muscle weakness (generalized): Secondary | ICD-10-CM | POA: Diagnosis not present

## 2015-09-23 DIAGNOSIS — Z9181 History of falling: Secondary | ICD-10-CM | POA: Diagnosis not present

## 2015-09-23 DIAGNOSIS — R2681 Unsteadiness on feet: Secondary | ICD-10-CM | POA: Diagnosis not present

## 2015-09-23 DIAGNOSIS — M545 Low back pain: Secondary | ICD-10-CM | POA: Diagnosis not present

## 2015-09-26 DIAGNOSIS — M6281 Muscle weakness (generalized): Secondary | ICD-10-CM | POA: Diagnosis not present

## 2015-09-26 DIAGNOSIS — M545 Low back pain: Secondary | ICD-10-CM | POA: Diagnosis not present

## 2015-09-26 DIAGNOSIS — R2681 Unsteadiness on feet: Secondary | ICD-10-CM | POA: Diagnosis not present

## 2015-09-26 DIAGNOSIS — R278 Other lack of coordination: Secondary | ICD-10-CM | POA: Diagnosis not present

## 2015-09-26 DIAGNOSIS — Z9181 History of falling: Secondary | ICD-10-CM | POA: Diagnosis not present

## 2015-09-29 DIAGNOSIS — M545 Low back pain: Secondary | ICD-10-CM | POA: Diagnosis not present

## 2015-09-29 DIAGNOSIS — Z9181 History of falling: Secondary | ICD-10-CM | POA: Diagnosis not present

## 2015-09-29 DIAGNOSIS — R278 Other lack of coordination: Secondary | ICD-10-CM | POA: Diagnosis not present

## 2015-09-29 DIAGNOSIS — M6281 Muscle weakness (generalized): Secondary | ICD-10-CM | POA: Diagnosis not present

## 2015-09-29 DIAGNOSIS — R2681 Unsteadiness on feet: Secondary | ICD-10-CM | POA: Diagnosis not present

## 2015-09-30 DIAGNOSIS — Z9181 History of falling: Secondary | ICD-10-CM | POA: Diagnosis not present

## 2015-09-30 DIAGNOSIS — R278 Other lack of coordination: Secondary | ICD-10-CM | POA: Diagnosis not present

## 2015-09-30 DIAGNOSIS — R2681 Unsteadiness on feet: Secondary | ICD-10-CM | POA: Diagnosis not present

## 2015-09-30 DIAGNOSIS — M6281 Muscle weakness (generalized): Secondary | ICD-10-CM | POA: Diagnosis not present

## 2015-09-30 DIAGNOSIS — M545 Low back pain: Secondary | ICD-10-CM | POA: Diagnosis not present

## 2015-10-01 DIAGNOSIS — M6281 Muscle weakness (generalized): Secondary | ICD-10-CM | POA: Diagnosis not present

## 2015-10-01 DIAGNOSIS — R278 Other lack of coordination: Secondary | ICD-10-CM | POA: Diagnosis not present

## 2015-10-01 DIAGNOSIS — Z9181 History of falling: Secondary | ICD-10-CM | POA: Diagnosis not present

## 2015-10-01 DIAGNOSIS — M545 Low back pain: Secondary | ICD-10-CM | POA: Diagnosis not present

## 2015-10-01 DIAGNOSIS — R2681 Unsteadiness on feet: Secondary | ICD-10-CM | POA: Diagnosis not present

## 2015-10-02 DIAGNOSIS — R2681 Unsteadiness on feet: Secondary | ICD-10-CM | POA: Diagnosis not present

## 2015-10-02 DIAGNOSIS — R278 Other lack of coordination: Secondary | ICD-10-CM | POA: Diagnosis not present

## 2015-10-02 DIAGNOSIS — Z9181 History of falling: Secondary | ICD-10-CM | POA: Diagnosis not present

## 2015-10-02 DIAGNOSIS — M6281 Muscle weakness (generalized): Secondary | ICD-10-CM | POA: Diagnosis not present

## 2015-10-02 DIAGNOSIS — M545 Low back pain: Secondary | ICD-10-CM | POA: Diagnosis not present

## 2015-10-03 DIAGNOSIS — M6281 Muscle weakness (generalized): Secondary | ICD-10-CM | POA: Diagnosis not present

## 2015-10-03 DIAGNOSIS — R2681 Unsteadiness on feet: Secondary | ICD-10-CM | POA: Diagnosis not present

## 2015-10-03 DIAGNOSIS — Z9181 History of falling: Secondary | ICD-10-CM | POA: Diagnosis not present

## 2015-10-03 DIAGNOSIS — R278 Other lack of coordination: Secondary | ICD-10-CM | POA: Diagnosis not present

## 2015-10-03 DIAGNOSIS — M545 Low back pain: Secondary | ICD-10-CM | POA: Diagnosis not present

## 2015-10-06 DIAGNOSIS — M6281 Muscle weakness (generalized): Secondary | ICD-10-CM | POA: Diagnosis not present

## 2015-10-06 DIAGNOSIS — M545 Low back pain: Secondary | ICD-10-CM | POA: Diagnosis not present

## 2015-10-06 DIAGNOSIS — R278 Other lack of coordination: Secondary | ICD-10-CM | POA: Diagnosis not present

## 2015-10-06 DIAGNOSIS — Z9181 History of falling: Secondary | ICD-10-CM | POA: Diagnosis not present

## 2015-10-06 DIAGNOSIS — R2681 Unsteadiness on feet: Secondary | ICD-10-CM | POA: Diagnosis not present

## 2015-10-07 DIAGNOSIS — Z9181 History of falling: Secondary | ICD-10-CM | POA: Diagnosis not present

## 2015-10-07 DIAGNOSIS — R2681 Unsteadiness on feet: Secondary | ICD-10-CM | POA: Diagnosis not present

## 2015-10-07 DIAGNOSIS — R278 Other lack of coordination: Secondary | ICD-10-CM | POA: Diagnosis not present

## 2015-10-07 DIAGNOSIS — M6281 Muscle weakness (generalized): Secondary | ICD-10-CM | POA: Diagnosis not present

## 2015-10-07 DIAGNOSIS — M545 Low back pain: Secondary | ICD-10-CM | POA: Diagnosis not present

## 2015-10-08 DIAGNOSIS — R278 Other lack of coordination: Secondary | ICD-10-CM | POA: Diagnosis not present

## 2015-10-08 DIAGNOSIS — M6281 Muscle weakness (generalized): Secondary | ICD-10-CM | POA: Diagnosis not present

## 2015-10-08 DIAGNOSIS — Z9181 History of falling: Secondary | ICD-10-CM | POA: Diagnosis not present

## 2015-10-08 DIAGNOSIS — M545 Low back pain: Secondary | ICD-10-CM | POA: Diagnosis not present

## 2015-10-08 DIAGNOSIS — R2681 Unsteadiness on feet: Secondary | ICD-10-CM | POA: Diagnosis not present

## 2015-10-09 DIAGNOSIS — M545 Low back pain: Secondary | ICD-10-CM | POA: Diagnosis not present

## 2015-10-09 DIAGNOSIS — M6281 Muscle weakness (generalized): Secondary | ICD-10-CM | POA: Diagnosis not present

## 2015-10-09 DIAGNOSIS — R2681 Unsteadiness on feet: Secondary | ICD-10-CM | POA: Diagnosis not present

## 2015-10-09 DIAGNOSIS — Z9181 History of falling: Secondary | ICD-10-CM | POA: Diagnosis not present

## 2015-10-09 DIAGNOSIS — R278 Other lack of coordination: Secondary | ICD-10-CM | POA: Diagnosis not present

## 2015-10-10 DIAGNOSIS — R278 Other lack of coordination: Secondary | ICD-10-CM | POA: Diagnosis not present

## 2015-10-10 DIAGNOSIS — M6281 Muscle weakness (generalized): Secondary | ICD-10-CM | POA: Diagnosis not present

## 2015-10-10 DIAGNOSIS — Z9181 History of falling: Secondary | ICD-10-CM | POA: Diagnosis not present

## 2015-10-10 DIAGNOSIS — M545 Low back pain: Secondary | ICD-10-CM | POA: Diagnosis not present

## 2015-10-10 DIAGNOSIS — R2681 Unsteadiness on feet: Secondary | ICD-10-CM | POA: Diagnosis not present

## 2015-10-13 DIAGNOSIS — R2681 Unsteadiness on feet: Secondary | ICD-10-CM | POA: Diagnosis not present

## 2015-10-13 DIAGNOSIS — Z9181 History of falling: Secondary | ICD-10-CM | POA: Diagnosis not present

## 2015-10-13 DIAGNOSIS — M6281 Muscle weakness (generalized): Secondary | ICD-10-CM | POA: Diagnosis not present

## 2015-10-13 DIAGNOSIS — M545 Low back pain: Secondary | ICD-10-CM | POA: Diagnosis not present

## 2015-10-13 DIAGNOSIS — R278 Other lack of coordination: Secondary | ICD-10-CM | POA: Diagnosis not present

## 2015-10-15 DIAGNOSIS — M6281 Muscle weakness (generalized): Secondary | ICD-10-CM | POA: Diagnosis not present

## 2015-10-15 DIAGNOSIS — R278 Other lack of coordination: Secondary | ICD-10-CM | POA: Diagnosis not present

## 2015-10-15 DIAGNOSIS — Z9181 History of falling: Secondary | ICD-10-CM | POA: Diagnosis not present

## 2015-10-15 DIAGNOSIS — R2681 Unsteadiness on feet: Secondary | ICD-10-CM | POA: Diagnosis not present

## 2015-10-15 DIAGNOSIS — M545 Low back pain: Secondary | ICD-10-CM | POA: Diagnosis not present

## 2015-10-16 DIAGNOSIS — M545 Low back pain: Secondary | ICD-10-CM | POA: Diagnosis not present

## 2015-10-16 DIAGNOSIS — Z9181 History of falling: Secondary | ICD-10-CM | POA: Diagnosis not present

## 2015-10-16 DIAGNOSIS — R278 Other lack of coordination: Secondary | ICD-10-CM | POA: Diagnosis not present

## 2015-10-16 DIAGNOSIS — M6281 Muscle weakness (generalized): Secondary | ICD-10-CM | POA: Diagnosis not present

## 2015-10-16 DIAGNOSIS — R2681 Unsteadiness on feet: Secondary | ICD-10-CM | POA: Diagnosis not present

## 2015-10-17 DIAGNOSIS — M545 Low back pain: Secondary | ICD-10-CM | POA: Diagnosis not present

## 2015-10-17 DIAGNOSIS — M6281 Muscle weakness (generalized): Secondary | ICD-10-CM | POA: Diagnosis not present

## 2015-10-17 DIAGNOSIS — R278 Other lack of coordination: Secondary | ICD-10-CM | POA: Diagnosis not present

## 2015-10-17 DIAGNOSIS — Z9181 History of falling: Secondary | ICD-10-CM | POA: Diagnosis not present

## 2015-10-17 DIAGNOSIS — R2681 Unsteadiness on feet: Secondary | ICD-10-CM | POA: Diagnosis not present

## 2015-10-20 DIAGNOSIS — R2681 Unsteadiness on feet: Secondary | ICD-10-CM | POA: Diagnosis not present

## 2015-10-20 DIAGNOSIS — M545 Low back pain: Secondary | ICD-10-CM | POA: Diagnosis not present

## 2015-10-20 DIAGNOSIS — R278 Other lack of coordination: Secondary | ICD-10-CM | POA: Diagnosis not present

## 2015-10-20 DIAGNOSIS — M6281 Muscle weakness (generalized): Secondary | ICD-10-CM | POA: Diagnosis not present

## 2015-10-20 DIAGNOSIS — Z9181 History of falling: Secondary | ICD-10-CM | POA: Diagnosis not present

## 2015-10-21 DIAGNOSIS — M6281 Muscle weakness (generalized): Secondary | ICD-10-CM | POA: Diagnosis not present

## 2015-10-21 DIAGNOSIS — M545 Low back pain: Secondary | ICD-10-CM | POA: Diagnosis not present

## 2015-10-21 DIAGNOSIS — R2681 Unsteadiness on feet: Secondary | ICD-10-CM | POA: Diagnosis not present

## 2015-10-21 DIAGNOSIS — R278 Other lack of coordination: Secondary | ICD-10-CM | POA: Diagnosis not present

## 2015-10-21 DIAGNOSIS — Z9181 History of falling: Secondary | ICD-10-CM | POA: Diagnosis not present

## 2015-10-22 DIAGNOSIS — M545 Low back pain: Secondary | ICD-10-CM | POA: Diagnosis not present

## 2015-10-22 DIAGNOSIS — Z9181 History of falling: Secondary | ICD-10-CM | POA: Diagnosis not present

## 2015-10-22 DIAGNOSIS — M6281 Muscle weakness (generalized): Secondary | ICD-10-CM | POA: Diagnosis not present

## 2015-10-22 DIAGNOSIS — R2681 Unsteadiness on feet: Secondary | ICD-10-CM | POA: Diagnosis not present

## 2015-10-22 DIAGNOSIS — R278 Other lack of coordination: Secondary | ICD-10-CM | POA: Diagnosis not present

## 2015-10-29 DIAGNOSIS — Z9181 History of falling: Secondary | ICD-10-CM | POA: Diagnosis not present

## 2015-10-29 DIAGNOSIS — M545 Low back pain: Secondary | ICD-10-CM | POA: Diagnosis not present

## 2015-10-29 DIAGNOSIS — R278 Other lack of coordination: Secondary | ICD-10-CM | POA: Diagnosis not present

## 2015-10-29 DIAGNOSIS — M6281 Muscle weakness (generalized): Secondary | ICD-10-CM | POA: Diagnosis not present

## 2015-10-29 DIAGNOSIS — R2681 Unsteadiness on feet: Secondary | ICD-10-CM | POA: Diagnosis not present

## 2015-10-30 DIAGNOSIS — R2681 Unsteadiness on feet: Secondary | ICD-10-CM | POA: Diagnosis not present

## 2015-10-30 DIAGNOSIS — M545 Low back pain: Secondary | ICD-10-CM | POA: Diagnosis not present

## 2015-10-30 DIAGNOSIS — Z9181 History of falling: Secondary | ICD-10-CM | POA: Diagnosis not present

## 2015-10-30 DIAGNOSIS — R278 Other lack of coordination: Secondary | ICD-10-CM | POA: Diagnosis not present

## 2015-10-30 DIAGNOSIS — M6281 Muscle weakness (generalized): Secondary | ICD-10-CM | POA: Diagnosis not present

## 2015-10-31 DIAGNOSIS — R278 Other lack of coordination: Secondary | ICD-10-CM | POA: Diagnosis not present

## 2015-10-31 DIAGNOSIS — R2681 Unsteadiness on feet: Secondary | ICD-10-CM | POA: Diagnosis not present

## 2015-10-31 DIAGNOSIS — M545 Low back pain: Secondary | ICD-10-CM | POA: Diagnosis not present

## 2015-10-31 DIAGNOSIS — Z9181 History of falling: Secondary | ICD-10-CM | POA: Diagnosis not present

## 2015-10-31 DIAGNOSIS — M6281 Muscle weakness (generalized): Secondary | ICD-10-CM | POA: Diagnosis not present

## 2015-11-04 DIAGNOSIS — R2681 Unsteadiness on feet: Secondary | ICD-10-CM | POA: Diagnosis not present

## 2015-11-04 DIAGNOSIS — Z9181 History of falling: Secondary | ICD-10-CM | POA: Diagnosis not present

## 2015-11-04 DIAGNOSIS — R278 Other lack of coordination: Secondary | ICD-10-CM | POA: Diagnosis not present

## 2015-11-04 DIAGNOSIS — M6281 Muscle weakness (generalized): Secondary | ICD-10-CM | POA: Diagnosis not present

## 2015-11-04 DIAGNOSIS — M545 Low back pain: Secondary | ICD-10-CM | POA: Diagnosis not present

## 2015-11-05 DIAGNOSIS — R2681 Unsteadiness on feet: Secondary | ICD-10-CM | POA: Diagnosis not present

## 2015-11-05 DIAGNOSIS — Z9181 History of falling: Secondary | ICD-10-CM | POA: Diagnosis not present

## 2015-11-05 DIAGNOSIS — R2689 Other abnormalities of gait and mobility: Secondary | ICD-10-CM | POA: Diagnosis not present

## 2015-11-05 DIAGNOSIS — Z23 Encounter for immunization: Secondary | ICD-10-CM | POA: Diagnosis not present

## 2015-11-05 DIAGNOSIS — E784 Other hyperlipidemia: Secondary | ICD-10-CM | POA: Diagnosis not present

## 2015-11-05 DIAGNOSIS — Z8673 Personal history of transient ischemic attack (TIA), and cerebral infarction without residual deficits: Secondary | ICD-10-CM | POA: Diagnosis not present

## 2015-11-05 DIAGNOSIS — M545 Low back pain: Secondary | ICD-10-CM | POA: Diagnosis not present

## 2015-11-05 DIAGNOSIS — Z6826 Body mass index (BMI) 26.0-26.9, adult: Secondary | ICD-10-CM | POA: Diagnosis not present

## 2015-11-05 DIAGNOSIS — M6281 Muscle weakness (generalized): Secondary | ICD-10-CM | POA: Diagnosis not present

## 2015-11-05 DIAGNOSIS — R7301 Impaired fasting glucose: Secondary | ICD-10-CM | POA: Diagnosis not present

## 2015-11-05 DIAGNOSIS — R278 Other lack of coordination: Secondary | ICD-10-CM | POA: Diagnosis not present

## 2015-11-05 DIAGNOSIS — R030 Elevated blood-pressure reading, without diagnosis of hypertension: Secondary | ICD-10-CM | POA: Diagnosis not present

## 2015-11-06 DIAGNOSIS — R2681 Unsteadiness on feet: Secondary | ICD-10-CM | POA: Diagnosis not present

## 2015-11-06 DIAGNOSIS — M6281 Muscle weakness (generalized): Secondary | ICD-10-CM | POA: Diagnosis not present

## 2015-11-06 DIAGNOSIS — M545 Low back pain: Secondary | ICD-10-CM | POA: Diagnosis not present

## 2015-11-06 DIAGNOSIS — Z9181 History of falling: Secondary | ICD-10-CM | POA: Diagnosis not present

## 2015-11-06 DIAGNOSIS — R278 Other lack of coordination: Secondary | ICD-10-CM | POA: Diagnosis not present

## 2015-11-10 DIAGNOSIS — Z9181 History of falling: Secondary | ICD-10-CM | POA: Diagnosis not present

## 2015-11-10 DIAGNOSIS — R278 Other lack of coordination: Secondary | ICD-10-CM | POA: Diagnosis not present

## 2015-11-10 DIAGNOSIS — M6281 Muscle weakness (generalized): Secondary | ICD-10-CM | POA: Diagnosis not present

## 2015-11-10 DIAGNOSIS — R2681 Unsteadiness on feet: Secondary | ICD-10-CM | POA: Diagnosis not present

## 2015-11-10 DIAGNOSIS — M545 Low back pain: Secondary | ICD-10-CM | POA: Diagnosis not present

## 2015-11-12 DIAGNOSIS — Z9181 History of falling: Secondary | ICD-10-CM | POA: Diagnosis not present

## 2015-11-12 DIAGNOSIS — R2681 Unsteadiness on feet: Secondary | ICD-10-CM | POA: Diagnosis not present

## 2015-11-12 DIAGNOSIS — M6281 Muscle weakness (generalized): Secondary | ICD-10-CM | POA: Diagnosis not present

## 2015-11-12 DIAGNOSIS — M545 Low back pain: Secondary | ICD-10-CM | POA: Diagnosis not present

## 2015-11-12 DIAGNOSIS — R278 Other lack of coordination: Secondary | ICD-10-CM | POA: Diagnosis not present

## 2015-11-14 DIAGNOSIS — Z9181 History of falling: Secondary | ICD-10-CM | POA: Diagnosis not present

## 2015-11-14 DIAGNOSIS — R2681 Unsteadiness on feet: Secondary | ICD-10-CM | POA: Diagnosis not present

## 2015-11-14 DIAGNOSIS — M545 Low back pain: Secondary | ICD-10-CM | POA: Diagnosis not present

## 2015-11-14 DIAGNOSIS — M6281 Muscle weakness (generalized): Secondary | ICD-10-CM | POA: Diagnosis not present

## 2015-11-14 DIAGNOSIS — R278 Other lack of coordination: Secondary | ICD-10-CM | POA: Diagnosis not present

## 2015-11-17 DIAGNOSIS — M6281 Muscle weakness (generalized): Secondary | ICD-10-CM | POA: Diagnosis not present

## 2015-11-17 DIAGNOSIS — R2681 Unsteadiness on feet: Secondary | ICD-10-CM | POA: Diagnosis not present

## 2015-11-17 DIAGNOSIS — Z9181 History of falling: Secondary | ICD-10-CM | POA: Diagnosis not present

## 2015-11-17 DIAGNOSIS — M545 Low back pain: Secondary | ICD-10-CM | POA: Diagnosis not present

## 2015-11-17 DIAGNOSIS — R278 Other lack of coordination: Secondary | ICD-10-CM | POA: Diagnosis not present

## 2015-11-19 DIAGNOSIS — M6281 Muscle weakness (generalized): Secondary | ICD-10-CM | POA: Diagnosis not present

## 2015-11-19 DIAGNOSIS — R2681 Unsteadiness on feet: Secondary | ICD-10-CM | POA: Diagnosis not present

## 2015-11-19 DIAGNOSIS — M545 Low back pain: Secondary | ICD-10-CM | POA: Diagnosis not present

## 2015-11-19 DIAGNOSIS — Z9181 History of falling: Secondary | ICD-10-CM | POA: Diagnosis not present

## 2015-11-19 DIAGNOSIS — R278 Other lack of coordination: Secondary | ICD-10-CM | POA: Diagnosis not present

## 2015-11-20 DIAGNOSIS — M6281 Muscle weakness (generalized): Secondary | ICD-10-CM | POA: Diagnosis not present

## 2015-11-20 DIAGNOSIS — R278 Other lack of coordination: Secondary | ICD-10-CM | POA: Diagnosis not present

## 2015-11-20 DIAGNOSIS — M545 Low back pain: Secondary | ICD-10-CM | POA: Diagnosis not present

## 2015-11-20 DIAGNOSIS — R2681 Unsteadiness on feet: Secondary | ICD-10-CM | POA: Diagnosis not present

## 2015-11-20 DIAGNOSIS — Z9181 History of falling: Secondary | ICD-10-CM | POA: Diagnosis not present

## 2015-11-21 DIAGNOSIS — R278 Other lack of coordination: Secondary | ICD-10-CM | POA: Diagnosis not present

## 2015-11-21 DIAGNOSIS — M545 Low back pain: Secondary | ICD-10-CM | POA: Diagnosis not present

## 2015-11-21 DIAGNOSIS — M6281 Muscle weakness (generalized): Secondary | ICD-10-CM | POA: Diagnosis not present

## 2015-11-21 DIAGNOSIS — Z9181 History of falling: Secondary | ICD-10-CM | POA: Diagnosis not present

## 2015-11-21 DIAGNOSIS — R2681 Unsteadiness on feet: Secondary | ICD-10-CM | POA: Diagnosis not present

## 2015-11-24 DIAGNOSIS — M6281 Muscle weakness (generalized): Secondary | ICD-10-CM | POA: Diagnosis not present

## 2015-11-24 DIAGNOSIS — R278 Other lack of coordination: Secondary | ICD-10-CM | POA: Diagnosis not present

## 2015-11-24 DIAGNOSIS — R2681 Unsteadiness on feet: Secondary | ICD-10-CM | POA: Diagnosis not present

## 2015-11-24 DIAGNOSIS — Z9181 History of falling: Secondary | ICD-10-CM | POA: Diagnosis not present

## 2015-11-24 DIAGNOSIS — M545 Low back pain: Secondary | ICD-10-CM | POA: Diagnosis not present

## 2015-11-25 DIAGNOSIS — M6281 Muscle weakness (generalized): Secondary | ICD-10-CM | POA: Diagnosis not present

## 2015-11-25 DIAGNOSIS — R278 Other lack of coordination: Secondary | ICD-10-CM | POA: Diagnosis not present

## 2015-11-25 DIAGNOSIS — M545 Low back pain: Secondary | ICD-10-CM | POA: Diagnosis not present

## 2015-11-25 DIAGNOSIS — Z9181 History of falling: Secondary | ICD-10-CM | POA: Diagnosis not present

## 2015-11-25 DIAGNOSIS — R2681 Unsteadiness on feet: Secondary | ICD-10-CM | POA: Diagnosis not present

## 2015-11-27 DIAGNOSIS — R2681 Unsteadiness on feet: Secondary | ICD-10-CM | POA: Diagnosis not present

## 2015-11-27 DIAGNOSIS — M6281 Muscle weakness (generalized): Secondary | ICD-10-CM | POA: Diagnosis not present

## 2015-11-27 DIAGNOSIS — M545 Low back pain: Secondary | ICD-10-CM | POA: Diagnosis not present

## 2015-11-27 DIAGNOSIS — R278 Other lack of coordination: Secondary | ICD-10-CM | POA: Diagnosis not present

## 2015-11-27 DIAGNOSIS — Z9181 History of falling: Secondary | ICD-10-CM | POA: Diagnosis not present

## 2015-11-28 DIAGNOSIS — R2681 Unsteadiness on feet: Secondary | ICD-10-CM | POA: Diagnosis not present

## 2015-11-28 DIAGNOSIS — Z9181 History of falling: Secondary | ICD-10-CM | POA: Diagnosis not present

## 2015-11-28 DIAGNOSIS — M6281 Muscle weakness (generalized): Secondary | ICD-10-CM | POA: Diagnosis not present

## 2015-11-28 DIAGNOSIS — M545 Low back pain: Secondary | ICD-10-CM | POA: Diagnosis not present

## 2015-11-28 DIAGNOSIS — R278 Other lack of coordination: Secondary | ICD-10-CM | POA: Diagnosis not present

## 2015-12-01 DIAGNOSIS — M545 Low back pain: Secondary | ICD-10-CM | POA: Diagnosis not present

## 2015-12-01 DIAGNOSIS — Z9181 History of falling: Secondary | ICD-10-CM | POA: Diagnosis not present

## 2015-12-01 DIAGNOSIS — R2681 Unsteadiness on feet: Secondary | ICD-10-CM | POA: Diagnosis not present

## 2015-12-01 DIAGNOSIS — M6281 Muscle weakness (generalized): Secondary | ICD-10-CM | POA: Diagnosis not present

## 2015-12-01 DIAGNOSIS — R278 Other lack of coordination: Secondary | ICD-10-CM | POA: Diagnosis not present

## 2015-12-03 DIAGNOSIS — M545 Low back pain: Secondary | ICD-10-CM | POA: Diagnosis not present

## 2015-12-03 DIAGNOSIS — R278 Other lack of coordination: Secondary | ICD-10-CM | POA: Diagnosis not present

## 2015-12-03 DIAGNOSIS — M6281 Muscle weakness (generalized): Secondary | ICD-10-CM | POA: Diagnosis not present

## 2015-12-04 DIAGNOSIS — M545 Low back pain: Secondary | ICD-10-CM | POA: Diagnosis not present

## 2015-12-04 DIAGNOSIS — M6281 Muscle weakness (generalized): Secondary | ICD-10-CM | POA: Diagnosis not present

## 2015-12-04 DIAGNOSIS — R278 Other lack of coordination: Secondary | ICD-10-CM | POA: Diagnosis not present

## 2015-12-08 DIAGNOSIS — R278 Other lack of coordination: Secondary | ICD-10-CM | POA: Diagnosis not present

## 2015-12-08 DIAGNOSIS — M545 Low back pain: Secondary | ICD-10-CM | POA: Diagnosis not present

## 2015-12-08 DIAGNOSIS — M6281 Muscle weakness (generalized): Secondary | ICD-10-CM | POA: Diagnosis not present

## 2015-12-10 DIAGNOSIS — M545 Low back pain: Secondary | ICD-10-CM | POA: Diagnosis not present

## 2015-12-10 DIAGNOSIS — M6281 Muscle weakness (generalized): Secondary | ICD-10-CM | POA: Diagnosis not present

## 2015-12-10 DIAGNOSIS — R278 Other lack of coordination: Secondary | ICD-10-CM | POA: Diagnosis not present

## 2015-12-11 DIAGNOSIS — M545 Low back pain: Secondary | ICD-10-CM | POA: Diagnosis not present

## 2015-12-11 DIAGNOSIS — R278 Other lack of coordination: Secondary | ICD-10-CM | POA: Diagnosis not present

## 2015-12-11 DIAGNOSIS — M6281 Muscle weakness (generalized): Secondary | ICD-10-CM | POA: Diagnosis not present

## 2015-12-15 ENCOUNTER — Encounter (INDEPENDENT_AMBULATORY_CARE_PROVIDER_SITE_OTHER): Payer: Medicare Other

## 2015-12-15 DIAGNOSIS — R000 Tachycardia, unspecified: Secondary | ICD-10-CM | POA: Diagnosis not present

## 2015-12-16 DIAGNOSIS — R278 Other lack of coordination: Secondary | ICD-10-CM | POA: Diagnosis not present

## 2015-12-16 DIAGNOSIS — M6281 Muscle weakness (generalized): Secondary | ICD-10-CM | POA: Diagnosis not present

## 2015-12-16 DIAGNOSIS — M545 Low back pain: Secondary | ICD-10-CM | POA: Diagnosis not present

## 2015-12-18 DIAGNOSIS — M6281 Muscle weakness (generalized): Secondary | ICD-10-CM | POA: Diagnosis not present

## 2015-12-18 DIAGNOSIS — M545 Low back pain: Secondary | ICD-10-CM | POA: Diagnosis not present

## 2015-12-18 DIAGNOSIS — R278 Other lack of coordination: Secondary | ICD-10-CM | POA: Diagnosis not present

## 2015-12-23 DIAGNOSIS — M545 Low back pain: Secondary | ICD-10-CM | POA: Diagnosis not present

## 2015-12-23 DIAGNOSIS — M6281 Muscle weakness (generalized): Secondary | ICD-10-CM | POA: Diagnosis not present

## 2015-12-23 DIAGNOSIS — R278 Other lack of coordination: Secondary | ICD-10-CM | POA: Diagnosis not present

## 2015-12-26 DIAGNOSIS — M545 Low back pain: Secondary | ICD-10-CM | POA: Diagnosis not present

## 2015-12-26 DIAGNOSIS — M6281 Muscle weakness (generalized): Secondary | ICD-10-CM | POA: Diagnosis not present

## 2015-12-26 DIAGNOSIS — R278 Other lack of coordination: Secondary | ICD-10-CM | POA: Diagnosis not present

## 2015-12-30 DIAGNOSIS — M6281 Muscle weakness (generalized): Secondary | ICD-10-CM | POA: Diagnosis not present

## 2015-12-30 DIAGNOSIS — M545 Low back pain: Secondary | ICD-10-CM | POA: Diagnosis not present

## 2015-12-30 DIAGNOSIS — R278 Other lack of coordination: Secondary | ICD-10-CM | POA: Diagnosis not present

## 2016-01-05 DIAGNOSIS — R000 Tachycardia, unspecified: Secondary | ICD-10-CM | POA: Diagnosis not present

## 2016-01-05 DIAGNOSIS — R269 Unspecified abnormalities of gait and mobility: Secondary | ICD-10-CM | POA: Diagnosis not present

## 2016-01-05 DIAGNOSIS — Z6827 Body mass index (BMI) 27.0-27.9, adult: Secondary | ICD-10-CM | POA: Diagnosis not present

## 2016-01-05 DIAGNOSIS — R42 Dizziness and giddiness: Secondary | ICD-10-CM | POA: Diagnosis not present

## 2016-01-05 DIAGNOSIS — Z8673 Personal history of transient ischemic attack (TIA), and cerebral infarction without residual deficits: Secondary | ICD-10-CM | POA: Diagnosis not present

## 2016-01-28 DIAGNOSIS — H40013 Open angle with borderline findings, low risk, bilateral: Secondary | ICD-10-CM | POA: Diagnosis not present

## 2016-02-25 DIAGNOSIS — H353122 Nonexudative age-related macular degeneration, left eye, intermediate dry stage: Secondary | ICD-10-CM | POA: Diagnosis not present

## 2016-02-25 DIAGNOSIS — H353212 Exudative age-related macular degeneration, right eye, with inactive choroidal neovascularization: Secondary | ICD-10-CM | POA: Diagnosis not present

## 2016-02-25 DIAGNOSIS — H40013 Open angle with borderline findings, low risk, bilateral: Secondary | ICD-10-CM | POA: Diagnosis not present

## 2016-06-16 DIAGNOSIS — E784 Other hyperlipidemia: Secondary | ICD-10-CM | POA: Diagnosis not present

## 2016-06-16 DIAGNOSIS — R7301 Impaired fasting glucose: Secondary | ICD-10-CM | POA: Diagnosis not present

## 2016-06-16 DIAGNOSIS — I10 Essential (primary) hypertension: Secondary | ICD-10-CM | POA: Diagnosis not present

## 2016-06-16 DIAGNOSIS — Z125 Encounter for screening for malignant neoplasm of prostate: Secondary | ICD-10-CM | POA: Diagnosis not present

## 2016-06-23 DIAGNOSIS — I10 Essential (primary) hypertension: Secondary | ICD-10-CM | POA: Diagnosis not present

## 2016-06-23 DIAGNOSIS — Z6830 Body mass index (BMI) 30.0-30.9, adult: Secondary | ICD-10-CM | POA: Diagnosis not present

## 2016-06-23 DIAGNOSIS — Z1389 Encounter for screening for other disorder: Secondary | ICD-10-CM | POA: Diagnosis not present

## 2016-06-23 DIAGNOSIS — R7301 Impaired fasting glucose: Secondary | ICD-10-CM | POA: Diagnosis not present

## 2016-06-23 DIAGNOSIS — R42 Dizziness and giddiness: Secondary | ICD-10-CM | POA: Diagnosis not present

## 2016-06-23 DIAGNOSIS — G3184 Mild cognitive impairment, so stated: Secondary | ICD-10-CM | POA: Diagnosis not present

## 2016-06-23 DIAGNOSIS — G6289 Other specified polyneuropathies: Secondary | ICD-10-CM | POA: Diagnosis not present

## 2016-06-23 DIAGNOSIS — Z23 Encounter for immunization: Secondary | ICD-10-CM | POA: Diagnosis not present

## 2016-06-23 DIAGNOSIS — E784 Other hyperlipidemia: Secondary | ICD-10-CM | POA: Diagnosis not present

## 2016-06-23 DIAGNOSIS — Z0000 Encounter for general adult medical examination without abnormal findings: Secondary | ICD-10-CM | POA: Diagnosis not present

## 2016-06-23 DIAGNOSIS — E669 Obesity, unspecified: Secondary | ICD-10-CM | POA: Diagnosis not present

## 2016-07-14 DIAGNOSIS — Z13820 Encounter for screening for osteoporosis: Secondary | ICD-10-CM | POA: Diagnosis not present

## 2016-07-26 DIAGNOSIS — M6281 Muscle weakness (generalized): Secondary | ICD-10-CM | POA: Diagnosis not present

## 2016-07-26 DIAGNOSIS — R2681 Unsteadiness on feet: Secondary | ICD-10-CM | POA: Diagnosis not present

## 2016-07-26 DIAGNOSIS — Z9181 History of falling: Secondary | ICD-10-CM | POA: Diagnosis not present

## 2016-07-26 DIAGNOSIS — R278 Other lack of coordination: Secondary | ICD-10-CM | POA: Diagnosis not present

## 2016-07-27 DIAGNOSIS — Z9181 History of falling: Secondary | ICD-10-CM | POA: Diagnosis not present

## 2016-07-27 DIAGNOSIS — M6281 Muscle weakness (generalized): Secondary | ICD-10-CM | POA: Diagnosis not present

## 2016-07-27 DIAGNOSIS — R2681 Unsteadiness on feet: Secondary | ICD-10-CM | POA: Diagnosis not present

## 2016-07-27 DIAGNOSIS — R278 Other lack of coordination: Secondary | ICD-10-CM | POA: Diagnosis not present

## 2016-07-29 DIAGNOSIS — M6281 Muscle weakness (generalized): Secondary | ICD-10-CM | POA: Diagnosis not present

## 2016-07-29 DIAGNOSIS — Z9181 History of falling: Secondary | ICD-10-CM | POA: Diagnosis not present

## 2016-07-29 DIAGNOSIS — R2681 Unsteadiness on feet: Secondary | ICD-10-CM | POA: Diagnosis not present

## 2016-07-29 DIAGNOSIS — R278 Other lack of coordination: Secondary | ICD-10-CM | POA: Diagnosis not present

## 2016-08-02 DIAGNOSIS — R278 Other lack of coordination: Secondary | ICD-10-CM | POA: Diagnosis not present

## 2016-08-02 DIAGNOSIS — R2681 Unsteadiness on feet: Secondary | ICD-10-CM | POA: Diagnosis not present

## 2016-08-02 DIAGNOSIS — Z9181 History of falling: Secondary | ICD-10-CM | POA: Diagnosis not present

## 2016-08-02 DIAGNOSIS — M6281 Muscle weakness (generalized): Secondary | ICD-10-CM | POA: Diagnosis not present

## 2016-08-04 DIAGNOSIS — R278 Other lack of coordination: Secondary | ICD-10-CM | POA: Diagnosis not present

## 2016-08-04 DIAGNOSIS — R2681 Unsteadiness on feet: Secondary | ICD-10-CM | POA: Diagnosis not present

## 2016-08-04 DIAGNOSIS — Z9181 History of falling: Secondary | ICD-10-CM | POA: Diagnosis not present

## 2016-08-04 DIAGNOSIS — M6281 Muscle weakness (generalized): Secondary | ICD-10-CM | POA: Diagnosis not present

## 2016-08-05 DIAGNOSIS — R2681 Unsteadiness on feet: Secondary | ICD-10-CM | POA: Diagnosis not present

## 2016-08-05 DIAGNOSIS — M6281 Muscle weakness (generalized): Secondary | ICD-10-CM | POA: Diagnosis not present

## 2016-08-05 DIAGNOSIS — R278 Other lack of coordination: Secondary | ICD-10-CM | POA: Diagnosis not present

## 2016-08-05 DIAGNOSIS — Z9181 History of falling: Secondary | ICD-10-CM | POA: Diagnosis not present

## 2016-08-08 DIAGNOSIS — Z9181 History of falling: Secondary | ICD-10-CM | POA: Diagnosis not present

## 2016-08-08 DIAGNOSIS — R278 Other lack of coordination: Secondary | ICD-10-CM | POA: Diagnosis not present

## 2016-08-08 DIAGNOSIS — R2681 Unsteadiness on feet: Secondary | ICD-10-CM | POA: Diagnosis not present

## 2016-08-08 DIAGNOSIS — M6281 Muscle weakness (generalized): Secondary | ICD-10-CM | POA: Diagnosis not present

## 2016-08-10 DIAGNOSIS — Z9181 History of falling: Secondary | ICD-10-CM | POA: Diagnosis not present

## 2016-08-10 DIAGNOSIS — R278 Other lack of coordination: Secondary | ICD-10-CM | POA: Diagnosis not present

## 2016-08-10 DIAGNOSIS — R2681 Unsteadiness on feet: Secondary | ICD-10-CM | POA: Diagnosis not present

## 2016-08-10 DIAGNOSIS — M6281 Muscle weakness (generalized): Secondary | ICD-10-CM | POA: Diagnosis not present

## 2016-08-12 DIAGNOSIS — R2681 Unsteadiness on feet: Secondary | ICD-10-CM | POA: Diagnosis not present

## 2016-08-12 DIAGNOSIS — R278 Other lack of coordination: Secondary | ICD-10-CM | POA: Diagnosis not present

## 2016-08-12 DIAGNOSIS — M6281 Muscle weakness (generalized): Secondary | ICD-10-CM | POA: Diagnosis not present

## 2016-08-12 DIAGNOSIS — Z9181 History of falling: Secondary | ICD-10-CM | POA: Diagnosis not present

## 2016-08-16 DIAGNOSIS — R278 Other lack of coordination: Secondary | ICD-10-CM | POA: Diagnosis not present

## 2016-08-16 DIAGNOSIS — R2681 Unsteadiness on feet: Secondary | ICD-10-CM | POA: Diagnosis not present

## 2016-08-16 DIAGNOSIS — Z9181 History of falling: Secondary | ICD-10-CM | POA: Diagnosis not present

## 2016-08-16 DIAGNOSIS — M6281 Muscle weakness (generalized): Secondary | ICD-10-CM | POA: Diagnosis not present

## 2016-08-18 DIAGNOSIS — R278 Other lack of coordination: Secondary | ICD-10-CM | POA: Diagnosis not present

## 2016-08-18 DIAGNOSIS — M6281 Muscle weakness (generalized): Secondary | ICD-10-CM | POA: Diagnosis not present

## 2016-08-18 DIAGNOSIS — R2681 Unsteadiness on feet: Secondary | ICD-10-CM | POA: Diagnosis not present

## 2016-08-18 DIAGNOSIS — Z9181 History of falling: Secondary | ICD-10-CM | POA: Diagnosis not present

## 2016-08-23 DIAGNOSIS — Z9181 History of falling: Secondary | ICD-10-CM | POA: Diagnosis not present

## 2016-08-23 DIAGNOSIS — R2681 Unsteadiness on feet: Secondary | ICD-10-CM | POA: Diagnosis not present

## 2016-08-23 DIAGNOSIS — R278 Other lack of coordination: Secondary | ICD-10-CM | POA: Diagnosis not present

## 2016-08-23 DIAGNOSIS — M6281 Muscle weakness (generalized): Secondary | ICD-10-CM | POA: Diagnosis not present

## 2016-08-25 DIAGNOSIS — H353212 Exudative age-related macular degeneration, right eye, with inactive choroidal neovascularization: Secondary | ICD-10-CM | POA: Diagnosis not present

## 2016-08-25 DIAGNOSIS — H43813 Vitreous degeneration, bilateral: Secondary | ICD-10-CM | POA: Diagnosis not present

## 2016-08-25 DIAGNOSIS — H353221 Exudative age-related macular degeneration, left eye, with active choroidal neovascularization: Secondary | ICD-10-CM | POA: Diagnosis not present

## 2016-08-26 DIAGNOSIS — Z9181 History of falling: Secondary | ICD-10-CM | POA: Diagnosis not present

## 2016-08-26 DIAGNOSIS — R278 Other lack of coordination: Secondary | ICD-10-CM | POA: Diagnosis not present

## 2016-08-26 DIAGNOSIS — R2681 Unsteadiness on feet: Secondary | ICD-10-CM | POA: Diagnosis not present

## 2016-08-26 DIAGNOSIS — M6281 Muscle weakness (generalized): Secondary | ICD-10-CM | POA: Diagnosis not present

## 2016-08-30 DIAGNOSIS — R278 Other lack of coordination: Secondary | ICD-10-CM | POA: Diagnosis not present

## 2016-08-30 DIAGNOSIS — R2681 Unsteadiness on feet: Secondary | ICD-10-CM | POA: Diagnosis not present

## 2016-08-30 DIAGNOSIS — M6281 Muscle weakness (generalized): Secondary | ICD-10-CM | POA: Diagnosis not present

## 2016-08-30 DIAGNOSIS — Z9181 History of falling: Secondary | ICD-10-CM | POA: Diagnosis not present

## 2016-09-01 DIAGNOSIS — H353221 Exudative age-related macular degeneration, left eye, with active choroidal neovascularization: Secondary | ICD-10-CM | POA: Diagnosis not present

## 2016-09-07 DIAGNOSIS — M6281 Muscle weakness (generalized): Secondary | ICD-10-CM | POA: Diagnosis not present

## 2016-09-07 DIAGNOSIS — R2681 Unsteadiness on feet: Secondary | ICD-10-CM | POA: Diagnosis not present

## 2016-09-07 DIAGNOSIS — Z9181 History of falling: Secondary | ICD-10-CM | POA: Diagnosis not present

## 2016-09-07 DIAGNOSIS — R278 Other lack of coordination: Secondary | ICD-10-CM | POA: Diagnosis not present

## 2016-09-09 DIAGNOSIS — R2681 Unsteadiness on feet: Secondary | ICD-10-CM | POA: Diagnosis not present

## 2016-09-09 DIAGNOSIS — Z9181 History of falling: Secondary | ICD-10-CM | POA: Diagnosis not present

## 2016-09-09 DIAGNOSIS — R278 Other lack of coordination: Secondary | ICD-10-CM | POA: Diagnosis not present

## 2016-09-09 DIAGNOSIS — M6281 Muscle weakness (generalized): Secondary | ICD-10-CM | POA: Diagnosis not present

## 2016-09-14 DIAGNOSIS — R278 Other lack of coordination: Secondary | ICD-10-CM | POA: Diagnosis not present

## 2016-09-14 DIAGNOSIS — Z9181 History of falling: Secondary | ICD-10-CM | POA: Diagnosis not present

## 2016-09-14 DIAGNOSIS — R2681 Unsteadiness on feet: Secondary | ICD-10-CM | POA: Diagnosis not present

## 2016-09-14 DIAGNOSIS — M6281 Muscle weakness (generalized): Secondary | ICD-10-CM | POA: Diagnosis not present

## 2016-09-16 DIAGNOSIS — R2681 Unsteadiness on feet: Secondary | ICD-10-CM | POA: Diagnosis not present

## 2016-09-16 DIAGNOSIS — Z9181 History of falling: Secondary | ICD-10-CM | POA: Diagnosis not present

## 2016-09-16 DIAGNOSIS — R278 Other lack of coordination: Secondary | ICD-10-CM | POA: Diagnosis not present

## 2016-09-16 DIAGNOSIS — M6281 Muscle weakness (generalized): Secondary | ICD-10-CM | POA: Diagnosis not present

## 2016-09-19 DIAGNOSIS — R278 Other lack of coordination: Secondary | ICD-10-CM | POA: Diagnosis not present

## 2016-09-19 DIAGNOSIS — Z9181 History of falling: Secondary | ICD-10-CM | POA: Diagnosis not present

## 2016-09-19 DIAGNOSIS — R2681 Unsteadiness on feet: Secondary | ICD-10-CM | POA: Diagnosis not present

## 2016-09-19 DIAGNOSIS — M6281 Muscle weakness (generalized): Secondary | ICD-10-CM | POA: Diagnosis not present

## 2016-09-22 DIAGNOSIS — R2681 Unsteadiness on feet: Secondary | ICD-10-CM | POA: Diagnosis not present

## 2016-09-22 DIAGNOSIS — M6281 Muscle weakness (generalized): Secondary | ICD-10-CM | POA: Diagnosis not present

## 2016-09-22 DIAGNOSIS — Z9181 History of falling: Secondary | ICD-10-CM | POA: Diagnosis not present

## 2016-09-22 DIAGNOSIS — R278 Other lack of coordination: Secondary | ICD-10-CM | POA: Diagnosis not present

## 2016-09-28 DIAGNOSIS — R2681 Unsteadiness on feet: Secondary | ICD-10-CM | POA: Diagnosis not present

## 2016-09-28 DIAGNOSIS — Z9181 History of falling: Secondary | ICD-10-CM | POA: Diagnosis not present

## 2016-09-28 DIAGNOSIS — M6281 Muscle weakness (generalized): Secondary | ICD-10-CM | POA: Diagnosis not present

## 2016-09-28 DIAGNOSIS — R278 Other lack of coordination: Secondary | ICD-10-CM | POA: Diagnosis not present

## 2016-09-30 DIAGNOSIS — M6281 Muscle weakness (generalized): Secondary | ICD-10-CM | POA: Diagnosis not present

## 2016-09-30 DIAGNOSIS — R2681 Unsteadiness on feet: Secondary | ICD-10-CM | POA: Diagnosis not present

## 2016-09-30 DIAGNOSIS — R278 Other lack of coordination: Secondary | ICD-10-CM | POA: Diagnosis not present

## 2016-09-30 DIAGNOSIS — Z9181 History of falling: Secondary | ICD-10-CM | POA: Diagnosis not present

## 2016-10-01 DIAGNOSIS — H43813 Vitreous degeneration, bilateral: Secondary | ICD-10-CM | POA: Diagnosis not present

## 2016-10-01 DIAGNOSIS — H353212 Exudative age-related macular degeneration, right eye, with inactive choroidal neovascularization: Secondary | ICD-10-CM | POA: Diagnosis not present

## 2016-10-01 DIAGNOSIS — H353221 Exudative age-related macular degeneration, left eye, with active choroidal neovascularization: Secondary | ICD-10-CM | POA: Diagnosis not present

## 2016-10-05 DIAGNOSIS — R278 Other lack of coordination: Secondary | ICD-10-CM | POA: Diagnosis not present

## 2016-10-05 DIAGNOSIS — R2681 Unsteadiness on feet: Secondary | ICD-10-CM | POA: Diagnosis not present

## 2016-10-05 DIAGNOSIS — Z9181 History of falling: Secondary | ICD-10-CM | POA: Diagnosis not present

## 2016-10-05 DIAGNOSIS — M6281 Muscle weakness (generalized): Secondary | ICD-10-CM | POA: Diagnosis not present

## 2016-10-07 DIAGNOSIS — R2681 Unsteadiness on feet: Secondary | ICD-10-CM | POA: Diagnosis not present

## 2016-10-07 DIAGNOSIS — Z9181 History of falling: Secondary | ICD-10-CM | POA: Diagnosis not present

## 2016-10-07 DIAGNOSIS — R278 Other lack of coordination: Secondary | ICD-10-CM | POA: Diagnosis not present

## 2016-10-07 DIAGNOSIS — M6281 Muscle weakness (generalized): Secondary | ICD-10-CM | POA: Diagnosis not present

## 2016-10-12 DIAGNOSIS — R278 Other lack of coordination: Secondary | ICD-10-CM | POA: Diagnosis not present

## 2016-10-12 DIAGNOSIS — Z9181 History of falling: Secondary | ICD-10-CM | POA: Diagnosis not present

## 2016-10-12 DIAGNOSIS — R2681 Unsteadiness on feet: Secondary | ICD-10-CM | POA: Diagnosis not present

## 2016-10-12 DIAGNOSIS — M6281 Muscle weakness (generalized): Secondary | ICD-10-CM | POA: Diagnosis not present

## 2016-10-14 DIAGNOSIS — Z9181 History of falling: Secondary | ICD-10-CM | POA: Diagnosis not present

## 2016-10-14 DIAGNOSIS — R2681 Unsteadiness on feet: Secondary | ICD-10-CM | POA: Diagnosis not present

## 2016-10-14 DIAGNOSIS — M6281 Muscle weakness (generalized): Secondary | ICD-10-CM | POA: Diagnosis not present

## 2016-10-14 DIAGNOSIS — R278 Other lack of coordination: Secondary | ICD-10-CM | POA: Diagnosis not present

## 2016-10-19 DIAGNOSIS — M6281 Muscle weakness (generalized): Secondary | ICD-10-CM | POA: Diagnosis not present

## 2016-10-19 DIAGNOSIS — R278 Other lack of coordination: Secondary | ICD-10-CM | POA: Diagnosis not present

## 2016-10-19 DIAGNOSIS — Z9181 History of falling: Secondary | ICD-10-CM | POA: Diagnosis not present

## 2016-10-19 DIAGNOSIS — R2681 Unsteadiness on feet: Secondary | ICD-10-CM | POA: Diagnosis not present

## 2016-10-21 DIAGNOSIS — Z9181 History of falling: Secondary | ICD-10-CM | POA: Diagnosis not present

## 2016-10-21 DIAGNOSIS — R2681 Unsteadiness on feet: Secondary | ICD-10-CM | POA: Diagnosis not present

## 2016-10-21 DIAGNOSIS — R278 Other lack of coordination: Secondary | ICD-10-CM | POA: Diagnosis not present

## 2016-10-21 DIAGNOSIS — M6281 Muscle weakness (generalized): Secondary | ICD-10-CM | POA: Diagnosis not present

## 2016-10-26 DIAGNOSIS — Z9181 History of falling: Secondary | ICD-10-CM | POA: Diagnosis not present

## 2016-10-26 DIAGNOSIS — R2681 Unsteadiness on feet: Secondary | ICD-10-CM | POA: Diagnosis not present

## 2016-10-26 DIAGNOSIS — R278 Other lack of coordination: Secondary | ICD-10-CM | POA: Diagnosis not present

## 2016-10-26 DIAGNOSIS — M6281 Muscle weakness (generalized): Secondary | ICD-10-CM | POA: Diagnosis not present

## 2016-11-03 DIAGNOSIS — M810 Age-related osteoporosis without current pathological fracture: Secondary | ICD-10-CM | POA: Diagnosis not present

## 2016-11-03 DIAGNOSIS — I10 Essential (primary) hypertension: Secondary | ICD-10-CM | POA: Diagnosis not present

## 2016-11-03 DIAGNOSIS — R7301 Impaired fasting glucose: Secondary | ICD-10-CM | POA: Diagnosis not present

## 2016-11-03 DIAGNOSIS — K219 Gastro-esophageal reflux disease without esophagitis: Secondary | ICD-10-CM | POA: Diagnosis not present

## 2016-11-10 DIAGNOSIS — H43813 Vitreous degeneration, bilateral: Secondary | ICD-10-CM | POA: Diagnosis not present

## 2016-11-10 DIAGNOSIS — H353212 Exudative age-related macular degeneration, right eye, with inactive choroidal neovascularization: Secondary | ICD-10-CM | POA: Diagnosis not present

## 2016-11-10 DIAGNOSIS — H353221 Exudative age-related macular degeneration, left eye, with active choroidal neovascularization: Secondary | ICD-10-CM | POA: Diagnosis not present

## 2016-11-22 ENCOUNTER — Ambulatory Visit (HOSPITAL_COMMUNITY)
Admission: RE | Admit: 2016-11-22 | Discharge: 2016-11-22 | Disposition: A | Discharge status: Home / Self Care | Payer: Medicare Other | Source: Ambulatory Visit | Attending: Internal Medicine | Admitting: Internal Medicine

## 2016-11-22 ENCOUNTER — Encounter (HOSPITAL_COMMUNITY): Payer: Self-pay

## 2016-11-22 DIAGNOSIS — M810 Age-related osteoporosis without current pathological fracture: Secondary | ICD-10-CM | POA: Insufficient documentation

## 2016-11-22 HISTORY — DX: Age-related osteoporosis without current pathological fracture: M81.0

## 2016-11-22 MED ORDER — SODIUM CHLORIDE 0.9 % IV SOLN
INTRAVENOUS | Status: DC
  Administered 2016-11-22: 11:00:00 via INTRAVENOUS

## 2016-11-22 MED ORDER — ZOLEDRONIC ACID 5 MG/100ML IV SOLN
5.0000 mg | Freq: Once | INTRAVENOUS | Status: AC
  Administered 2016-11-22: 5 mg via INTRAVENOUS
  Filled 2016-11-22: qty 100

## 2016-11-22 NOTE — Progress Notes (Signed)
Called Dr Jacquelynn Cree office to obtain updated list of medications for patient.

## 2016-11-22 NOTE — Discharge Instructions (Signed)
Zoledronic Acid injection (Paget's Disease, Osteoporosis) °What is this medicine? °ZOLEDRONIC ACID (ZOE le dron ik AS id) lowers the amount of calcium loss from bone. It is used to treat Paget's disease and osteoporosis in women. °COMMON BRAND NAME(S): Reclast, Zometa °What should I tell my health care provider before I take this medicine? °They need to know if you have any of these conditions: °-aspirin-sensitive asthma °-cancer, especially if you are receiving medicines used to treat cancer °-dental disease or wear dentures °-infection °-kidney disease °-low levels of calcium in the blood °-past surgery on the parathyroid gland or intestines °-receiving corticosteroids like dexamethasone or prednisone °-an unusual or allergic reaction to zoledronic acid, other medicines, foods, dyes, or preservatives °-pregnant or trying to get pregnant °-breast-feeding °How should I use this medicine? °This medicine is for infusion into a vein. It is given by a health care professional in a hospital or clinic setting. °Talk to your pediatrician regarding the use of this medicine in children. This medicine is not approved for use in children. °What if I miss a dose? °It is important not to miss your dose. Call your doctor or health care professional if you are unable to keep an appointment. °What may interact with this medicine? °-certain antibiotics given by injection °-NSAIDs, medicines for pain and inflammation, like ibuprofen or naproxen °-some diuretics like bumetanide, furosemide °-teriparatide °What should I watch for while using this medicine? °Visit your doctor or health care professional for regular checkups. It may be some time before you see the benefit from this medicine. Do not stop taking your medicine unless your doctor tells you to. Your doctor may order blood tests or other tests to see how you are doing. °Women should inform their doctor if they wish to become pregnant or think they might be pregnant. There is a  potential for serious side effects to an unborn child. Talk to your health care professional or pharmacist for more information. °You should make sure that you get enough calcium and vitamin D while you are taking this medicine. Discuss the foods you eat and the vitamins you take with your health care professional. °Some people who take this medicine have severe bone, joint, and/or muscle pain. This medicine may also increase your risk for jaw problems or a broken thigh bone. Tell your doctor right away if you have severe pain in your jaw, bones, joints, or muscles. Tell your doctor if you have any pain that does not go away or that gets worse. °Tell your dentist and dental surgeon that you are taking this medicine. You should not have major dental surgery while on this medicine. See your dentist to have a dental exam and fix any dental problems before starting this medicine. Take good care of your teeth while on this medicine. Make sure you see your dentist for regular follow-up appointments. °What side effects may I notice from receiving this medicine? °Side effects that you should report to your doctor or health care professional as soon as possible: °-allergic reactions like skin rash, itching or hives, swelling of the face, lips, or tongue °-anxiety, confusion, or depression °-breathing problems °-changes in vision °-eye pain °-feeling faint or lightheaded, falls °-jaw pain, especially after dental work °-mouth sores °-muscle cramps, stiffness, or weakness °-redness, blistering, peeling or loosening of the skin, including inside the mouth °-trouble passing urine or change in the amount of urine °Side effects that usually do not require medical attention (report to your doctor or health care professional if   they continue or are bothersome): -bone, joint, or muscle pain -constipation -diarrhea -fever -hair loss -irritation at site where injected -loss of appetite -nausea, vomiting -stomach  upset -trouble sleeping -trouble swallowing -weak or tired Where should I keep my medicine? This drug is given in a hospital or clinic and will not be stored at home.  2017 Elsevier/Gold Standard (2014-03-16 14:19:57)    Please make sure you are getting 1200 mg of calcium every day either through diet or calcium supplement.

## 2017-01-05 DIAGNOSIS — H43813 Vitreous degeneration, bilateral: Secondary | ICD-10-CM | POA: Diagnosis not present

## 2017-01-05 DIAGNOSIS — H353221 Exudative age-related macular degeneration, left eye, with active choroidal neovascularization: Secondary | ICD-10-CM | POA: Diagnosis not present

## 2017-01-05 DIAGNOSIS — H353212 Exudative age-related macular degeneration, right eye, with inactive choroidal neovascularization: Secondary | ICD-10-CM | POA: Diagnosis not present

## 2017-03-16 DIAGNOSIS — H43813 Vitreous degeneration, bilateral: Secondary | ICD-10-CM | POA: Diagnosis not present

## 2017-03-16 DIAGNOSIS — H26491 Other secondary cataract, right eye: Secondary | ICD-10-CM | POA: Diagnosis not present

## 2017-03-16 DIAGNOSIS — H353212 Exudative age-related macular degeneration, right eye, with inactive choroidal neovascularization: Secondary | ICD-10-CM | POA: Diagnosis not present

## 2017-03-16 DIAGNOSIS — H353221 Exudative age-related macular degeneration, left eye, with active choroidal neovascularization: Secondary | ICD-10-CM | POA: Diagnosis not present

## 2017-05-25 DIAGNOSIS — H353212 Exudative age-related macular degeneration, right eye, with inactive choroidal neovascularization: Secondary | ICD-10-CM | POA: Diagnosis not present

## 2017-05-25 DIAGNOSIS — H40013 Open angle with borderline findings, low risk, bilateral: Secondary | ICD-10-CM | POA: Diagnosis not present

## 2017-05-25 DIAGNOSIS — H26491 Other secondary cataract, right eye: Secondary | ICD-10-CM | POA: Diagnosis not present

## 2017-05-25 DIAGNOSIS — H353122 Nonexudative age-related macular degeneration, left eye, intermediate dry stage: Secondary | ICD-10-CM | POA: Diagnosis not present

## 2017-06-01 DIAGNOSIS — H43813 Vitreous degeneration, bilateral: Secondary | ICD-10-CM | POA: Diagnosis not present

## 2017-06-01 DIAGNOSIS — H353221 Exudative age-related macular degeneration, left eye, with active choroidal neovascularization: Secondary | ICD-10-CM | POA: Diagnosis not present

## 2017-06-01 DIAGNOSIS — H353212 Exudative age-related macular degeneration, right eye, with inactive choroidal neovascularization: Secondary | ICD-10-CM | POA: Diagnosis not present

## 2017-07-13 DIAGNOSIS — M810 Age-related osteoporosis without current pathological fracture: Secondary | ICD-10-CM | POA: Diagnosis not present

## 2017-07-13 DIAGNOSIS — Z23 Encounter for immunization: Secondary | ICD-10-CM | POA: Diagnosis not present

## 2017-07-13 DIAGNOSIS — G3184 Mild cognitive impairment, so stated: Secondary | ICD-10-CM | POA: Diagnosis not present

## 2017-07-13 DIAGNOSIS — R7301 Impaired fasting glucose: Secondary | ICD-10-CM | POA: Diagnosis not present

## 2017-07-13 DIAGNOSIS — Z6831 Body mass index (BMI) 31.0-31.9, adult: Secondary | ICD-10-CM | POA: Diagnosis not present

## 2017-07-13 DIAGNOSIS — I10 Essential (primary) hypertension: Secondary | ICD-10-CM | POA: Diagnosis not present

## 2017-07-13 DIAGNOSIS — E785 Hyperlipidemia, unspecified: Secondary | ICD-10-CM | POA: Diagnosis not present

## 2017-07-13 DIAGNOSIS — N390 Urinary tract infection, site not specified: Secondary | ICD-10-CM | POA: Diagnosis not present

## 2017-07-13 DIAGNOSIS — Z8673 Personal history of transient ischemic attack (TIA), and cerebral infarction without residual deficits: Secondary | ICD-10-CM | POA: Diagnosis not present

## 2017-08-31 DIAGNOSIS — H353212 Exudative age-related macular degeneration, right eye, with inactive choroidal neovascularization: Secondary | ICD-10-CM | POA: Diagnosis not present

## 2017-08-31 DIAGNOSIS — H43813 Vitreous degeneration, bilateral: Secondary | ICD-10-CM | POA: Diagnosis not present

## 2017-08-31 DIAGNOSIS — H353221 Exudative age-related macular degeneration, left eye, with active choroidal neovascularization: Secondary | ICD-10-CM | POA: Diagnosis not present

## 2017-09-08 ENCOUNTER — Observation Stay (HOSPITAL_COMMUNITY)
Admission: EM | Admit: 2017-09-08 | Discharge: 2017-09-09 | Disposition: A | Discharge status: Home / Self Care | Payer: Medicare Other | Attending: Internal Medicine | Admitting: Internal Medicine

## 2017-09-08 ENCOUNTER — Observation Stay (HOSPITAL_BASED_OUTPATIENT_CLINIC_OR_DEPARTMENT_OTHER)
Admit: 2017-09-08 | Discharge: 2017-09-08 | Disposition: A | Discharge status: Home / Self Care | Payer: Medicare Other | Attending: Family Medicine | Admitting: Family Medicine

## 2017-09-08 ENCOUNTER — Other Ambulatory Visit: Payer: Self-pay

## 2017-09-08 ENCOUNTER — Emergency Department (HOSPITAL_COMMUNITY): Payer: Medicare Other

## 2017-09-08 ENCOUNTER — Encounter (HOSPITAL_COMMUNITY): Payer: Self-pay

## 2017-09-08 DIAGNOSIS — I10 Essential (primary) hypertension: Secondary | ICD-10-CM | POA: Insufficient documentation

## 2017-09-08 DIAGNOSIS — M25552 Pain in left hip: Secondary | ICD-10-CM | POA: Diagnosis not present

## 2017-09-08 DIAGNOSIS — Z7951 Long term (current) use of inhaled steroids: Secondary | ICD-10-CM | POA: Insufficient documentation

## 2017-09-08 DIAGNOSIS — Z7982 Long term (current) use of aspirin: Secondary | ICD-10-CM | POA: Insufficient documentation

## 2017-09-08 DIAGNOSIS — Z79899 Other long term (current) drug therapy: Secondary | ICD-10-CM | POA: Diagnosis not present

## 2017-09-08 DIAGNOSIS — G629 Polyneuropathy, unspecified: Secondary | ICD-10-CM | POA: Insufficient documentation

## 2017-09-08 DIAGNOSIS — Z8673 Personal history of transient ischemic attack (TIA), and cerebral infarction without residual deficits: Secondary | ICD-10-CM | POA: Insufficient documentation

## 2017-09-08 DIAGNOSIS — W19XXXA Unspecified fall, initial encounter: Secondary | ICD-10-CM

## 2017-09-08 DIAGNOSIS — R55 Syncope and collapse: Secondary | ICD-10-CM

## 2017-09-08 DIAGNOSIS — R42 Dizziness and giddiness: Secondary | ICD-10-CM | POA: Diagnosis not present

## 2017-09-08 DIAGNOSIS — I951 Orthostatic hypotension: Principal | ICD-10-CM | POA: Insufficient documentation

## 2017-09-08 DIAGNOSIS — F329 Major depressive disorder, single episode, unspecified: Secondary | ICD-10-CM | POA: Diagnosis not present

## 2017-09-08 DIAGNOSIS — R269 Unspecified abnormalities of gait and mobility: Secondary | ICD-10-CM

## 2017-09-08 DIAGNOSIS — R296 Repeated falls: Secondary | ICD-10-CM | POA: Diagnosis not present

## 2017-09-08 DIAGNOSIS — K219 Gastro-esophageal reflux disease without esophagitis: Secondary | ICD-10-CM | POA: Insufficient documentation

## 2017-09-08 DIAGNOSIS — L8990 Pressure ulcer of unspecified site, unspecified stage: Secondary | ICD-10-CM

## 2017-09-08 DIAGNOSIS — I739 Peripheral vascular disease, unspecified: Secondary | ICD-10-CM | POA: Diagnosis not present

## 2017-09-08 DIAGNOSIS — M545 Low back pain: Secondary | ICD-10-CM | POA: Diagnosis not present

## 2017-09-08 DIAGNOSIS — R404 Transient alteration of awareness: Secondary | ICD-10-CM | POA: Diagnosis not present

## 2017-09-08 DIAGNOSIS — I714 Abdominal aortic aneurysm, without rupture: Secondary | ICD-10-CM | POA: Insufficient documentation

## 2017-09-08 DIAGNOSIS — Z87891 Personal history of nicotine dependence: Secondary | ICD-10-CM | POA: Insufficient documentation

## 2017-09-08 DIAGNOSIS — S79912A Unspecified injury of left hip, initial encounter: Secondary | ICD-10-CM | POA: Diagnosis not present

## 2017-09-08 LAB — URINALYSIS, ROUTINE W REFLEX MICROSCOPIC
BILIRUBIN URINE: NEGATIVE
Glucose, UA: NEGATIVE mg/dL
HGB URINE DIPSTICK: NEGATIVE
KETONES UR: NEGATIVE mg/dL
Leukocytes, UA: NEGATIVE
NITRITE: NEGATIVE
Protein, ur: NEGATIVE mg/dL
Specific Gravity, Urine: 1.019 (ref 1.005–1.030)
pH: 6 (ref 5.0–8.0)

## 2017-09-08 LAB — BASIC METABOLIC PANEL
Anion gap: 8 (ref 5–15)
BUN: 14 mg/dL (ref 6–20)
CALCIUM: 8.8 mg/dL — AB (ref 8.9–10.3)
CO2: 24 mmol/L (ref 22–32)
CREATININE: 1.08 mg/dL (ref 0.61–1.24)
Chloride: 107 mmol/L (ref 101–111)
GFR calc Af Amer: >60 mL/min (ref >60)
GFR, EST NON AFRICAN AMERICAN: 59 mL/min — AB (ref >60)
GLUCOSE: 113 mg/dL — AB (ref 65–99)
Potassium: 4.6 mmol/L (ref 3.5–5.1)
Sodium: 139 mmol/L (ref 135–145)

## 2017-09-08 LAB — CBC
HCT: 47.7 % (ref 39.0–52.0)
Hemoglobin: 15.8 g/dL (ref 13.0–17.0)
MCH: 30.2 pg (ref 26.0–34.0)
MCHC: 33.1 g/dL (ref 30.0–36.0)
MCV: 91.2 fL (ref 78.0–100.0)
Platelets: 289 10*3/uL (ref 150–400)
RBC: 5.23 MIL/uL (ref 4.22–5.81)
RDW: 14.8 % (ref 11.5–15.5)
WBC: 8.3 10*3/uL (ref 4.0–10.5)

## 2017-09-08 LAB — TROPONIN I: Troponin I: <0.03 ng/mL (ref <0.03)

## 2017-09-08 LAB — CBG MONITORING, ED: GLUCOSE-CAPILLARY: 102 mg/dL — AB (ref 65–99)

## 2017-09-08 LAB — I-STAT TROPONIN, ED: TROPONIN I, POC: 0.01 ng/mL (ref 0.00–0.08)

## 2017-09-08 MED ORDER — PANTOPRAZOLE SODIUM 40 MG PO TBEC
40.0000 mg | DELAYED_RELEASE_TABLET | Freq: Every day | ORAL | Status: DC
  Administered 2017-09-08 – 2017-09-09 (×2): 40 mg via ORAL
  Filled 2017-09-08 (×2): qty 1

## 2017-09-08 MED ORDER — SODIUM CHLORIDE 0.9% FLUSH
3.0000 mL | Freq: Two times a day (BID) | INTRAVENOUS | Status: DC
  Administered 2017-09-08 – 2017-09-09 (×2): 3 mL via INTRAVENOUS

## 2017-09-08 MED ORDER — ONDANSETRON HCL 4 MG PO TABS: 4.0000 mg | ORAL_TABLET | Freq: Four times a day (QID) | ORAL | Status: DC | PRN

## 2017-09-08 MED ORDER — ACETAMINOPHEN 650 MG RE SUPP
650.0000 mg | Freq: Four times a day (QID) | RECTAL | Status: DC | PRN
Start: 2017-09-08 — End: 2017-09-09

## 2017-09-08 MED ORDER — ONDANSETRON HCL 4 MG/2ML IJ SOLN: 4.0000 mg | Freq: Four times a day (QID) | INTRAMUSCULAR | Status: DC | PRN

## 2017-09-08 MED ORDER — DULOXETINE HCL 30 MG PO CPEP
30.0000 mg | ORAL_CAPSULE | Freq: Every day | ORAL | Status: DC
  Administered 2017-09-09: 30 mg via ORAL
  Filled 2017-09-08: qty 1

## 2017-09-08 MED ORDER — DULOXETINE HCL 30 MG PO CPEP: 30.0000 mg | ORAL_CAPSULE | Freq: Every day | ORAL | Status: DC

## 2017-09-08 MED ORDER — ACETAMINOPHEN 325 MG PO TABS: 650.0000 mg | ORAL_TABLET | Freq: Four times a day (QID) | ORAL | Status: DC | PRN

## 2017-09-08 MED ORDER — SODIUM CHLORIDE 0.9% FLUSH: 3.0000 mL | INTRAVENOUS | Status: DC | PRN

## 2017-09-08 MED ORDER — VITAMIN D (ERGOCALCIFEROL) 1.25 MG (50000 UNIT) PO CAPS
50000.0000 [IU] | ORAL_CAPSULE | ORAL | Status: DC
  Administered 2017-09-09: 50000 [IU] via ORAL
  Filled 2017-09-08: qty 1

## 2017-09-08 MED ORDER — HEPARIN SODIUM (PORCINE) 5000 UNIT/ML IJ SOLN
5000.0000 [IU] | Freq: Three times a day (TID) | INTRAMUSCULAR | Status: DC
  Administered 2017-09-08 – 2017-09-09 (×2): 5000 [IU] via SUBCUTANEOUS
  Filled 2017-09-08 (×2): qty 1

## 2017-09-08 MED ORDER — SODIUM CHLORIDE 0.9 % IV SOLN: 250.0000 mL | INTRAVENOUS | Status: DC | PRN

## 2017-09-08 MED ORDER — ALBUTEROL SULFATE (2.5 MG/3ML) 0.083% IN NEBU: 2.5000 mg | INHALATION_SOLUTION | RESPIRATORY_TRACT | Status: DC | PRN

## 2017-09-08 MED ORDER — METOPROLOL TARTRATE 25 MG PO TABS
12.5000 mg | ORAL_TABLET | Freq: Two times a day (BID) | ORAL | Status: DC
  Administered 2017-09-08 – 2017-09-09 (×2): 12.5 mg via ORAL
  Filled 2017-09-08 (×2): qty 1

## 2017-09-08 MED ORDER — ASPIRIN EC 81 MG PO TBEC: 81.0000 mg | DELAYED_RELEASE_TABLET | Freq: Every day | ORAL | Status: DC

## 2017-09-08 MED ORDER — ASPIRIN EC 81 MG PO TBEC
81.0000 mg | DELAYED_RELEASE_TABLET | Freq: Every day | ORAL | Status: DC
  Administered 2017-09-09: 81 mg via ORAL
  Filled 2017-09-08: qty 1

## 2017-09-08 MED ORDER — OXYCODONE-ACETAMINOPHEN 5-325 MG PO TABS: 1.0000 | ORAL_TABLET | Freq: Three times a day (TID) | ORAL | Status: DC | PRN

## 2017-09-08 MED ORDER — POLYETHYLENE GLYCOL 3350 17 G PO PACK: 17.0000 g | PACK | Freq: Every day | ORAL | Status: DC | PRN

## 2017-09-08 MED ORDER — TRAZODONE HCL 50 MG PO TABS: 50.0000 mg | ORAL_TABLET | Freq: Every evening | ORAL | Status: DC | PRN

## 2017-09-08 MED ORDER — SENNA 8.6 MG PO TABS
1.0000 | ORAL_TABLET | Freq: Two times a day (BID) | ORAL | Status: DC
  Administered 2017-09-08 – 2017-09-09 (×2): 8.6 mg via ORAL
  Filled 2017-09-08 (×2): qty 1

## 2017-09-08 NOTE — ED Notes (Signed)
Patient transported to X-ray 

## 2017-09-08 NOTE — ED Notes (Signed)
Orthosttic VS paged to MD as requested.

## 2017-09-08 NOTE — ED Provider Notes (Signed)
[facility] Provider Note   CSN: [id number] Arrival date & time: 09/08/17  1153     History   Chief Complaint Chief Complaint  Patient presents with  . Loss of Consciousness  . Weakness    HPI Carl Rios is a 83 y.o. male with a h/o of HOH, peripheral neuropathy, recurrent falls. And rhabdomyolysis who presents to the emergency department with a chief complaint of fall that occurred this morning.  He reports that he was using his walker to get a cup of coffee when he suddenly felt dizzy and fell backwards, hitting his lower back.  EMS reports to nursing staff that the patient had a syncopal episode for an extended length of time.  Spoke with Daisy at [employer], the first on the scene after the fall, who reports that when she arrived at the patient's side that the patient was answering questions and was alert.  She reports that she was there within a minute after the episode.  He reports a chronic history of worsening dizziness when he first awakes in the morning, but states that the episode this morning was much different than the dizziness he has when he first wakes up.  In the ED, he denies chest pain, headache, shortness of breath, palpitations, nausea, vomiting, diarrhea.  He complains of left-sided low back pain.  He reports that he has been unable to ambulate since the fall.  Past medical history includes a CVA in 2015.  He takes an 81 mg aspirin daily.  No other blood thinners.  The history is provided by the patient. No language interpreter was used.    Past Medical History:  Diagnosis Date  . AAA (abdominal aortic aneurysm) without rupture ([place])   . CVA (cerebral infarction) 03/2014   right PCA CVA  . Diverticulosis   . Diverticulosis   . Dyslipidemia   . GERD (gastroesophageal reflux disease)   . Hearing difficulty   . Hemorrhoids   . Hiatal hernia 1960  . History of colon polyps   . History of prostatitis   . Hx of adenomatous  colonic polyps   . Hyperlipidemia   . Lower GI bleed 12/2011   secondary to diverticulosis  . Macular degeneration   . Obesity   . Peripheral neuropathy   . Peripheral vascular disease ([place])   . Premature ventricular contraction   . Rhabdomyolysis 03/2014   Due to prolonged fall  . Senile osteoporosis    as per documented on reclast infusion orders  . Tachycardia-bradycardia ([place])   . Unsteady gait     Patient Active Problem List   Diagnosis Date Noted  . Syncope and collapse 09/08/2017  . PVC's (premature ventricular contractions) 09/23/2014  . Bradycardia 09/23/2014  . Depressive disorder, not elsewhere classified 04/17/2014  . Recurrent falls 04/01/2014  . Paroxysmal SVT (supraventricular tachycardia) ([place]) 04/01/2014  . Unsteady gait 04/01/2014  . CVA (cerebral infarction) 03/31/2014  . Acute renal failure ([place]) 03/27/2014  . Dehydration 03/27/2014  . Rhabdomyolysis 03/26/2014  . AAA (abdominal aortic aneurysm) without rupture ([place]) 08/14/2013  . Abnormality of gait 01/08/2013  . Other general symptoms(780.99) 01/08/2013  . Other vitamin B12 deficiency anemia 01/08/2013  . Polyneuropathy in other diseases classified elsewhere ([place]) 01/08/2013  . Abdominal aneurysm without mention of rupture 02/01/2012  . Hypokalemia 01/25/2012  . Anemia due to acute blood loss 01/24/2012  . Benign neoplasm of colon 01/24/2012  . Hypertension 01/19/2012  . Other and unspecified hyperlipidemia 01/18/2012  .  Impaired fasting glucose 01/18/2012  . Unspecified hereditary and idiopathic peripheral neuropathy 01/18/2012  . Hypertonicity of bladder 01/18/2012  . Diverticulosis 01/18/2012  . Rectal bleeding 01/18/2012  . GERD 02/09/2010  . COLONIC POLYPS, ADENOMATOUS, HX OF 02/09/2010    Past Surgical History:  Procedure Laterality Date  . ABDOMINAL AORTIC ANEURYSM REPAIR    . CATARACT EXTRACTION Bilateral 07/2009, 09/2009  . INGUINAL HERNIA REPAIR    . KNEE SURGERY     left knee in  1970's  . TESTICLE SURGERY  1949   Undescended testicle  . TONSILLECTOMY         Home Medications    Prior to Admission medications   Medication Sig Start Date End Date Taking? Authorizing Provider  amLODipine (NORVASC) 5 MG tablet Take 5 mg by mouth daily.    [provider]  aspirin EC 81 MG EC tablet Take 1 tablet (81 mg total) by mouth daily. 03/29/14   Marton Redwood, MD  DULoxetine (CYMBALTA) 30 MG capsule Take 1 capsule (30 mg total) by mouth daily. 02/19/14   Kathrynn Ducking, MD  DULoxetine (CYMBALTA) 60 MG capsule Take 60 mg by mouth daily.    [provider]  fluticasone (FLONASE) 50 MCG/ACT nasal spray Place into both nostrils at bedtime.    [provider]  loratadine (CLARITIN) 10 MG tablet Take 10 mg by mouth daily.    [provider]  metoprolol tartrate (LOPRESSOR) 25 MG tablet Take 12.5 mg by mouth 2 (two) times daily.  05/22/14   [provider]  metoprolol tartrate (LOPRESSOR) 25 MG tablet Take 12.5 mg by mouth daily.    [provider]  omeprazole (PRILOSEC) 20 MG capsule Take 20 mg by mouth daily.  05/22/14   [provider]  oxyCODONE-acetaminophen (PERCOCET/ROXICET) 5-325 MG per tablet Take 1 tablet by mouth every 8 (eight) hours as needed for severe pain. 07/26/15   Glendell Docker, NP  Vitamin D, Ergocalciferol, (DRISDOL) 50000 units CAPS capsule Take 50,000 Units by mouth every 7 (seven) days.    [provider]    Family History Family History  Problem Relation Age of Onset  . Heart disease Father 43       Rheumatic heart  . Alcohol abuse Sister     Social History Social History   Tobacco Use  . Smoking status: Former Smoker    Years: 5.00    Types: Cigarettes    Last attempt to quit: 11/02/1955    Years since quitting: 61.8  . Smokeless tobacco: Never Used  Substance Use Topics  . Alcohol use: Yes    Alcohol/week: 1.8 oz    Types: 3 Standard drinks or equivalent per week     Comment: 3 drinks per week   (QUIT X 6 MONTHS)  . Drug use: No     Allergies   Sulfamethoxazole   Review of Systems Review of Systems  Constitutional: Negative for activity change and chills.  Respiratory: Negative for shortness of breath and wheezing.   Cardiovascular: Negative for chest pain, palpitations and leg swelling.  Gastrointestinal: Negative for abdominal pain, diarrhea, nausea and vomiting.  Musculoskeletal: Positive for arthralgias, gait problem and myalgias. Negative for back pain.  Skin: Positive for wound. Negative for rash.  Neurological: Positive for dizziness, syncope and weakness. Negative for tremors, seizures, facial asymmetry, speech difficulty, light-headedness, numbness and headaches.  Hematological: Does not bruise/bleed easily.  Psychiatric/Behavioral: Negative for confusion.     Physical Exam Updated Vital Signs BP (!) 143/80 (  BP Location: Right Arm)   Pulse 78   Temp 97.7 F (36.5 C) (Oral)   Resp 20   Ht 5\' 9"  (1.753 m)   Wt 84.3 kg (185 lb 12.8 oz) Comment: a scale  SpO2 98%   BMI 27.44 kg/m   Physical Exam  Constitutional: He is oriented to person, place, and time. He appears well-developed.  HENT:  Head: Normocephalic.  Right Ear: Decreased hearing is noted.  Left Ear: Decreased hearing is noted.  Eyes: Conjunctivae are normal.  Neck: Neck supple.  Cardiovascular: Normal rate and regular rhythm. Exam reveals no gallop and no friction rub.  No murmur heard. Pulmonary/Chest: Effort normal. No respiratory distress. He has no wheezes. He has no rales.  Abdominal: Soft. He exhibits no distension and no mass. There is no tenderness. There is no rebound. No hernia.  Musculoskeletal:  Palpation over the left posterior pelvis and left lateral hip.  No overlying ecchymosis, erythema, edema or warmth.  No point tenderness to palpation over the cervical, thoracic, or lumbar spinous processes or surrounding paraspinal muscles.  Sensation is intact  throughout the bilateral lower extremities.  5 out of 5 strength against resistance  with plantarflexion and dorsiflexion.  No tenderness to palpation of the right hip.  The bilateral knees and ankles are nontender to palpation.  Neurological: He is alert and oriented to person, place, and time.  Skin: Skin is warm and dry.  Psychiatric: His behavior is normal.  Nursing note and vitals reviewed.  ED Treatments / Results  Labs (all labs ordered are listed, but only abnormal results are displayed) Labs Reviewed  BASIC METABOLIC PANEL - Abnormal; Notable for the following components:      Result Value   Glucose, Bld 113 (*)    Calcium 8.8 (*)    GFR calc non Af Amer 59 (*)    All other components within normal limits  CBG MONITORING, ED - Abnormal; Notable for the following components:   Glucose-Capillary 102 (*)    All other components within normal limits  CBC  URINALYSIS, ROUTINE W REFLEX MICROSCOPIC  TROPONIN I  TROPONIN I  BASIC METABOLIC PANEL  CBC  I-STAT TROPONIN, ED    EKG  EKG Interpretation  Date/Time:  Thursday September 08 2017 12:11:53 EST Ventricular Rate:  83 PR Interval:    QRS Duration: 95 QT Interval:  390 QTC Calculation: 459 R Axis:   -62 Text Interpretation:  Sinus rhythm Multiple premature complexes, vent & supraven Anterolateral infarct, old no significant change since 2016 Confirmed by Sherwood Gambler [phone]) on 09/08/2017 12:53:38 PM       Radiology Dg Hip Unilat W Or Wo Pelvis 2-3 Views Left  Result Date: 09/08/2017 CLINICAL DATA:  Loss of consciousness today experienced with fall. Left hip pain. EXAM: DG HIP (WITH OR WITHOUT PELVIS) 2-3V LEFT COMPARISON:  Pelvic radiograph from 03/26/2014, CT 09/25/2013. FINDINGS: AP view the pelvis and AP and frog-leg views of the left hip are provided. Similar appearing axial joint space narrowing of both hips. No joint dislocation is identified. Small osteophyte along the posterior inferior aspect of the left  femoral head -neck junction is noted about on the frog-leg lateral view of the left femur. This has a similar appearance to that of the prior CT from 2014. Therefore it is not felt to represent a cortical fracture. Aorto bi-iliac stents are noted. The bony pelvis appears intact. The arcuate lines of the sacrum appear intact. No diastasis of the pelvis. IMPRESSION: 1. No  acute displaced hip fracture. Degenerative joint space narrowing of both hips. If the patient has pain out of proportion to radiographic findings, CT would be the next imaging study of choice for further assessment. 2. Small osteophyte at the femoral head-neck junction. 3. Intact bony pelvis. Electronically Signed   By: Ashley Royalty M.D.   On: 09/08/2017 15:10    Procedures Procedures (including critical care time)  Medications Ordered in ED Medications  metoprolol tartrate (LOPRESSOR) tablet 12.5 mg (not administered)  pantoprazole (PROTONIX) EC tablet 40 mg (not administered)  oxyCODONE-acetaminophen (PERCOCET/ROXICET) 5-325 MG per tablet 1 tablet (not administered)  Vitamin D (Ergocalciferol) (DRISDOL) capsule 50,000 Units (not administered)  sodium chloride flush (NS) 0.9 % injection 3 mL (not administered)  sodium chloride flush (NS) 0.9 % injection 3 mL (not administered)  0.9 %  sodium chloride infusion (not administered)  acetaminophen (TYLENOL) tablet 650 mg (not administered)    Or  acetaminophen (TYLENOL) suppository 650 mg (not administered)  traZODone (DESYREL) tablet 50 mg (not administered)  senna (SENOKOT) tablet 8.6 mg (not administered)  polyethylene glycol (MIRALAX / GLYCOLAX) packet 17 g (not administered)  ondansetron (ZOFRAN) tablet 4 mg (not administered)    Or  ondansetron (ZOFRAN) injection 4 mg (not administered)  albuterol (PROVENTIL) (2.5 MG/3ML) 0.083% nebulizer solution 2.5 mg (not administered)  heparin injection 5,000 Units (not administered)  DULoxetine (CYMBALTA) DR capsule 30 mg (not  administered)  aspirin EC tablet 81 mg (not administered)     Initial Impression / Assessment and Plan / ED Course  I have reviewed the triage vital signs and the nursing notes.  Pertinent labs & imaging results that were available during my care of the patient were reviewed by me and considered in my medical decision making (see chart for details).     83 year old male with a history of CVA and recurrent falls presenting with fall from standing and questionable unwitnessed syncopal episode.  He denies hitting his head .  Normal neurologic exam.  Head CT is not indicated at this time. X-ray of the pelvis and left hip is negative for acute fracture.  Labs are unremarkable.  EKG unchanged from previous. Patient has mild orthostatic hypotension. The patient was evaluated and discussed with Dr. Regenia Skeeter, attending physician.  Given the patient's history of multiple chronic medical conditions coupled with the unwitnessed questionable syncopal episode, will consult the hospitalist team for admission for continued syncopal workup. Spoke with Dr. Denton Brick who will admit the patient for continued evaluation and workup for syncope. The patient appears reasonably stabilized for admission considering the current resources, flow, and capabilities available in the ED at this time, and I doubt any other [facility] requiring further screening and/or treatment in the ED prior to admission.   Final Clinical Impressions(s) / ED Diagnoses   Final diagnoses:  Syncope, unspecified syncope type  Fall, initial encounter    ED Discharge Orders    None       Joanne Gavel, PA-C 09/08/17 2121    Sherwood Gambler, MD 09/10/17 [phone]

## 2017-09-08 NOTE — H&P (Signed)
Patient Demographics:    Carl Rios, is a 83 y.o. male  MRN: [id number]   DOB - 12-19-1925  Admit Date - 09/08/2017  Outpatient Primary MD for the patient is Marton Redwood, MD   Assessment & Plan:    Principal Problem:   Syncope and collapse Active Problems:   Hypertension   Abnormality of gait   Recurrent falls    1)Syncope-  Place on  telemetry monitored unit, watch for arrhythmias, check serial troponins and EKG for rule out ACS protocol, check echocardiogram to rule out significant aortic stenosis or other outflow obstruction, and also to evaluate EF and to rule out segmental/Regional wall motion abnormalities. Check carotid artery Dopplers to rule out hemodynamically significant stenosis, orthostatic vitals noted (Laying down SBP 132/78, HR 65; sitting down blood pressure down to 104/73, heart rate of 74), will hold amlodipine  2)Reccurrent Falls-see #1 above, get PT eval, may need home health services versus transfer to SNF from ALF  3)HTN-hold amlodipine due to #1 above with orthostatic concerns, continue metoprolol 12.5 mg twice daily   With History of - Reviewed by me  Past Medical History:  Diagnosis Date  . AAA (abdominal aortic aneurysm) without rupture ([place])   . CVA (cerebral infarction) 03/2014   right PCA CVA  . Diverticulosis   . Diverticulosis   . Dyslipidemia   . GERD (gastroesophageal reflux disease)   . Hearing difficulty   . Hemorrhoids   . Hiatal hernia 1960  . History of colon polyps   . History of prostatitis   . Hx of adenomatous colonic polyps   . Hyperlipidemia   . Lower GI bleed 12/2011   secondary to diverticulosis  . Macular degeneration   . Obesity   . Peripheral neuropathy   . Peripheral vascular disease ([place])   . Premature ventricular contraction   .  Rhabdomyolysis 03/2014   Due to prolonged fall  . Senile osteoporosis    as per documented on reclast infusion orders  . Tachycardia-bradycardia ([place])   . Unsteady gait       Past Surgical History:  Procedure Laterality Date  . ABDOMINAL AORTIC ANEURYSM REPAIR    . CATARACT EXTRACTION Bilateral 07/2009, 09/2009  . INGUINAL HERNIA REPAIR    . KNEE SURGERY     left knee in 1970's  . TESTICLE SURGERY  1949   Undescended testicle  . TONSILLECTOMY        Chief Complaint  Patient presents with  . Loss of Consciousness  . Weakness      HPI:    Carl Rios  is a 83 y.o. male past medical history relevant for hypertension, chronic dizziness and recurrent falls as well as hearing loss who presents to the ED from assisted living facility after being found down.  Patient is not the greatest historian, the fall was unwitnessed, questions about possible syncope as per EMS.  patient states that he was very dizzy and he fell,  denies loss of consciousness, denies chest pains palpitations or shortness of breath, there was no tongue biting no incontinence   In the ED patient is in no acute distress, very very hard of hearing, orthostatic vitals noted (Laying down SBP 132/78, HR 65; sitting down blood pressure down to 104/73, heart rate of 74).   No fever or chills, no productive cough, no vomiting or diarrhea   Review of systems:    In addition to the HPI above,   A full 12 point Review of 10 Systems was done, except as stated above, all other Review of 10 Systems were negative.    Social History:  Reviewed by me    Social History   Tobacco Use  . Smoking status: Former Smoker    Years: 5.00    Types: Cigarettes    Last attempt to quit: 11/02/1955    Years since quitting: 61.8  . Smokeless tobacco: Never Used  Substance Use Topics  . Alcohol use: Yes    Alcohol/week: 1.8 oz    Types: 3 Standard drinks or equivalent per week    Comment: 3 drinks per week   (QUIT X 6 MONTHS)      Family History :  Reviewed by me    Family History  Problem Relation Age of Onset  . Heart disease Father 42       Rheumatic heart  . Alcohol abuse Sister      Home Medications:   Prior to Admission medications   Medication Sig Start Date End Date Taking? Authorizing Provider  amLODipine (NORVASC) 5 MG tablet Take 5 mg by mouth daily.    [provider]  aspirin EC 81 MG EC tablet Take 1 tablet (81 mg total) by mouth daily. 03/29/14   Marton Redwood, MD  DULoxetine (CYMBALTA) 30 MG capsule Take 1 capsule (30 mg total) by mouth daily. 02/19/14   Kathrynn Ducking, MD  DULoxetine (CYMBALTA) 60 MG capsule Take 60 mg by mouth daily.    [provider]  fluticasone (FLONASE) 50 MCG/ACT nasal spray Place into both nostrils at bedtime.    [provider]  loratadine (CLARITIN) 10 MG tablet Take 10 mg by mouth daily.    [provider]  metoprolol tartrate (LOPRESSOR) 25 MG tablet Take 12.5 mg by mouth 2 (two) times daily.  05/22/14   [provider]  metoprolol tartrate (LOPRESSOR) 25 MG tablet Take 12.5 mg by mouth daily.    [provider]  omeprazole (PRILOSEC) 20 MG capsule Take 20 mg by mouth daily.  05/22/14   [provider]  oxyCODONE-acetaminophen (PERCOCET/ROXICET) 5-325 MG per tablet Take 1 tablet by mouth every 8 (eight) hours as needed for severe pain. 07/26/15   Glendell Docker, NP  Vitamin D, Ergocalciferol, (DRISDOL) 50000 units CAPS capsule Take 50,000 Units by mouth every 7 (seven) days.    [provider]     Allergies:     Allergies  Allergen Reactions  . Sulfamethoxazole Other (See Comments)    UNKNOWN     Physical Exam:   Vitals  Blood pressure 134/78, pulse 72, temperature 97.9 F (36.6 C), temperature source Oral, resp. rate 16, height 5\' 9"  (1.753 m), weight 85.7 kg (189 lb), SpO2 92 %.  Physical Examination: General appearance - alert, well appearing, and in no distress   Head-normocephalic atraumatic head Mental status - alert, oriented to person, place, and time, Eyes - sclera anicteric Ears-very very hard of hearing Neck - supple, no JVD  elevation , Chest - clear  to auscultation bilaterally, symmetrical air movement, Heart - S1 and S2 normal,  Abdomen - soft, nontender, nondistended, no masses or organomegaly Neurological - screening mental status exam normal, neck supple without rigidity, cranial nerves II through XII intact (except for hearing loss), DTR's normal and symmetric, generalized weakness without new focal deficits Extremities -   intact peripheral pulses  Skin - warm, dry    Data Review:    CBC Recent Labs  Lab 09/08/17 1252  WBC 8.3  HGB 15.8  HCT 47.7  PLT 289  MCV 91.2  MCH 30.2  MCHC 33.1  RDW 14.8   ------------------------------------------------------------------------------------------------------------------  Chemistries  Recent Labs  Lab 09/08/17 1252  NA 139  K 4.6  CL 107  CO2 24  GLUCOSE 113*  BUN 14  CREATININE 1.08  CALCIUM 8.8*   ------------------------------------------------------------------------------------------------------------------ estimated creatinine clearance is 50.3 mL/min (by C-G formula based on SCr of 1.08 mg/dL). ------------------------------------------------------------------------------------------------------------------ No results for input(s): TSH, T4TOTAL, T3FREE, THYROIDAB in the last 72 hours.  Invalid input(s): FREET3   Coagulation profile No results for input(s): INR, PROTIME in the last 168 hours. ------------------------------------------------------------------------------------------------------------------- No results for input(s): DDIMER in the last 72 hours. -------------------------------------------------------------------------------------------------------------------  Cardiac Enzymes No results for input(s): CKMB, TROPONINI, MYOGLOBIN in the last 168  hours.  Invalid input(s): CK ------------------------------------------------------------------------------------------------------------------ No results found for: BNP   ---------------------------------------------------------------------------------------------------------------  Urinalysis    Component Value Date/Time   COLORURINE YELLOW 09/08/2017 [place] 09/08/2017 1515   LABSPEC 1.019 09/08/2017 1515   PHURINE 6.0 09/08/2017 1515   GLUCOSEU NEGATIVE 09/08/2017 1515   HGBUR NEGATIVE 09/08/2017 1515   BILIRUBINUR NEGATIVE 09/08/2017 1515   KETONESUR NEGATIVE 09/08/2017 1515   PROTEINUR NEGATIVE 09/08/2017 1515   UROBILINOGEN 1.0 07/26/2015 1745   NITRITE NEGATIVE 09/08/2017 1515   LEUKOCYTESUR NEGATIVE 09/08/2017 1515    ----------------------------------------------------------------------------------------------------------------   Imaging Results:    Dg Hip Unilat W Or Wo Pelvis 2-3 Views Left  Result Date: 09/08/2017 CLINICAL DATA:  Loss of consciousness today experienced with fall. Left hip pain. EXAM: DG HIP (WITH OR WITHOUT PELVIS) 2-3V LEFT COMPARISON:  Pelvic radiograph from 03/26/2014, CT 09/25/2013. FINDINGS: AP view the pelvis and AP and frog-leg views of the left hip are provided. Similar appearing axial joint space narrowing of both hips. No joint dislocation is identified. Small osteophyte along the posterior inferior aspect of the left femoral head -neck junction is noted about on the frog-leg lateral view of the left femur. This has a similar appearance to that of the prior CT from 2014. Therefore it is not felt to represent a cortical fracture. Aorto bi-iliac stents are noted. The bony pelvis appears intact. The arcuate lines of the sacrum appear intact. No diastasis of the pelvis. IMPRESSION: 1. No acute displaced hip fracture. Degenerative joint space narrowing of both hips. If the patient has pain out of proportion to radiographic findings,  CT would be the next imaging study of choice for further assessment. 2. Small osteophyte at the femoral head-neck junction. 3. Intact bony pelvis. Electronically Signed   By: Ashley Royalty M.D.   On: 09/08/2017 15:10    Radiological Exams on Admission: Dg Hip Unilat W Or Wo Pelvis 2-3 Views Left  Result Date: 09/08/2017 CLINICAL DATA:  Loss of consciousness today experienced with fall. Left hip pain. EXAM: DG HIP (WITH OR WITHOUT PELVIS) 2-3V LEFT COMPARISON:  Pelvic radiograph from 03/26/2014, CT 09/25/2013. FINDINGS: AP view the pelvis and AP and frog-leg views  of the left hip are provided. Similar appearing axial joint space narrowing of both hips. No joint dislocation is identified. Small osteophyte along the posterior inferior aspect of the left femoral head -neck junction is noted about on the frog-leg lateral view of the left femur. This has a similar appearance to that of the prior CT from 2014. Therefore it is not felt to represent a cortical fracture. Aorto bi-iliac stents are noted. The bony pelvis appears intact. The arcuate lines of the sacrum appear intact. No diastasis of the pelvis. IMPRESSION: 1. No acute displaced hip fracture. Degenerative joint space narrowing of both hips. If the patient has pain out of proportion to radiographic findings, CT would be the next imaging study of choice for further assessment. 2. Small osteophyte at the femoral head-neck junction. 3. Intact bony pelvis. Electronically Signed   By: Ashley Royalty M.D.   On: 09/08/2017 15:10    DVT Prophylaxis -SCD /heparin AM Labs Ordered, also please review Full Orders  Family Communication: Admission, patients condition and plan of care including tests being ordered have been discussed with the patient who indicate understanding and agree with the plan   Code Status - Full Code  Likely DC to ALF with home health services versus SNF  Condition   stable  Teriann Livingood M.D on 09/08/2017 at 5:27 PM   Between 7am to  7pm - Pager - [phone] After 7pm go to [email or web address] - password [email or web address]  [facility] - Office  [phone]  Voice Recognition Viviann Spare dictation system was used to create this note, attempts have been made to correct errors. Please contact the author with questions and/or clarifications.

## 2017-09-08 NOTE — ED Triage Notes (Signed)
[facility] had episode of syncope with LOC. Pt had weakness on waking. Hx of weakness and dizziness over last 6 months with no previous LOC.

## 2017-09-08 NOTE — ED Notes (Signed)
Pt's dinner tray arrived. 

## 2017-09-08 NOTE — ED Notes (Signed)
Pt given po fluids and crackers while awating meal tray.

## 2017-09-08 NOTE — ED Notes (Signed)
Pts dinner tray ordered.  

## 2017-09-08 NOTE — Progress Notes (Signed)
VASCULAR LAB PRELIMINARY  PRELIMINARY  PRELIMINARY  PRELIMINARY  \Carotid duplex completed.    Preliminary report:  Bilateral:  1-39% ICA stenosis.  Vertebral artery flow is antegrade.     Carl Rios, [place], RVS 09/08/2017, 6:21 PM

## 2017-09-09 ENCOUNTER — Observation Stay (HOSPITAL_BASED_OUTPATIENT_CLINIC_OR_DEPARTMENT_OTHER): Payer: Medicare Other

## 2017-09-09 DIAGNOSIS — I951 Orthostatic hypotension: Secondary | ICD-10-CM | POA: Diagnosis not present

## 2017-09-09 DIAGNOSIS — R55 Syncope and collapse: Secondary | ICD-10-CM

## 2017-09-09 DIAGNOSIS — L8990 Pressure ulcer of unspecified site, unspecified stage: Secondary | ICD-10-CM

## 2017-09-09 LAB — CBC
HEMATOCRIT: 44 % (ref 39.0–52.0)
HEMOGLOBIN: 14.4 g/dL (ref 13.0–17.0)
MCH: 29.5 pg (ref 26.0–34.0)
MCHC: 32.7 g/dL (ref 30.0–36.0)
MCV: 90.2 fL (ref 78.0–100.0)
Platelets: 271 10*3/uL (ref 150–400)
RBC: 4.88 MIL/uL (ref 4.22–5.81)
RDW: 14.5 % (ref 11.5–15.5)
WBC: 7.7 10*3/uL (ref 4.0–10.5)

## 2017-09-09 LAB — BASIC METABOLIC PANEL
ANION GAP: 8 (ref 5–15)
BUN: 19 mg/dL (ref 6–20)
CHLORIDE: 106 mmol/L (ref 101–111)
CO2: 24 mmol/L (ref 22–32)
CREATININE: 1.06 mg/dL (ref 0.61–1.24)
Calcium: 8.9 mg/dL (ref 8.9–10.3)
GFR calc non Af Amer: >60 mL/min (ref >60)
Glucose, Bld: 100 mg/dL — ABNORMAL HIGH (ref 65–99)
POTASSIUM: 4.2 mmol/L (ref 3.5–5.1)
SODIUM: 138 mmol/L (ref 135–145)

## 2017-09-09 LAB — VAS US CAROTID
LCCAPDIAS: 14 cm/s
LEFT ECA DIAS: -4 cm/s
LEFT VERTEBRAL DIAS: -5 cm/s
LICAPDIAS: -16 cm/s
Left CCA dist dias: -15 cm/s
Left CCA dist sys: -57 cm/s
Left CCA prox sys: 99 cm/s
Left ICA dist dias: -14 cm/s
Left ICA dist sys: -54 cm/s
Left ICA prox sys: -65 cm/s
RCCAPDIAS: 12 cm/s
RIGHT ECA DIAS: -7 cm/s
RIGHT VERTEBRAL DIAS: -14 cm/s
Right CCA prox sys: 65 cm/s
Right cca dist sys: -62 cm/s

## 2017-09-09 LAB — ECHOCARDIOGRAM COMPLETE
Height: 69 in
WEIGHTICAEL: 2955.2 [oz_av]

## 2017-09-09 MED ORDER — POLYETHYLENE GLYCOL 3350 17 G PO PACK: 17.0000 g | PACK | Freq: Every day | ORAL | 0 refills | Status: AC | PRN

## 2017-09-09 MED ORDER — MIDODRINE HCL 5 MG PO TABS
2.5000 mg | ORAL_TABLET | Freq: Two times a day (BID) | ORAL | Status: DC
Start: 2017-09-09 — End: 2017-09-09
  Filled 2017-09-09: qty 1

## 2017-09-09 MED ORDER — MIDODRINE HCL 2.5 MG PO TABS: 2.5000 mg | ORAL_TABLET | Freq: Two times a day (BID) | ORAL | 0 refills | Status: AC

## 2017-09-09 MED ORDER — SENNA 8.6 MG PO TABS: 1.0000 | ORAL_TABLET | Freq: Two times a day (BID) | ORAL | 0 refills | Status: AC

## 2017-09-09 NOTE — Discharge Summary (Signed)
Physician Discharge Summary  Carl Rios OHY:[id number] DOB: 11-Jan-1926 DOA: 09/08/2017  PCP: Marton Redwood, MD  Admit date: 09/08/2017 Discharge date: 09/09/2017  Admitted From: independent living [facility] Recommendations for Outpatient Follow-up:  1. Follow up with PCP in 1-2 weeks 2. Please obtain BMP/CBC in one week [facility] Equipment/Devices:none  Discharge Condition stable CODE STATUS full Diet recommendation: cardiac Brief/Interim Summary:83 y.o.malepast medical history relevant for hypertension, chronic dizziness and recurrent falls as well as hearing loss who presents to the ED from assisted living facility after being found down.  Patient is not the greatest historian,the fall was unwitnessed,questions about possible syncope as per EMS.patient states that he was very dizzy and he fell,denies loss of consciousness,denies chest pains palpitations or shortness of breath,there was no tongue biting no incontinence   In the ED patient is in no acute distress,very very hard of hearing, orthostatic vitals noted(Laying down SBP 132/78, HR 65; sitting downblood pressure down to 104/73,heart rate of 74).  No fever or chills, no productive cough, no vomiting or diarrhea.  he denies headaches,double vision.   Discharge Diagnoses:  Principal Problem:   Syncope and collapse Active Problems:   Hypertension   Abnormality of gait   Recurrent falls   Pressure injury of skin  1]recurrent falls due to orthostatic hypotension.cardiac work up negative.carotid ultrasound no hemodynamically significant stenosis.will try small dose midodrine and ted hose to help with orthostatics.pt eval noted for SNF. 2]history of CVA 2015 3]pERIPHERAL NEUROpathy    Follow-up Information    [facility] Follow up.   Specialty:  Home Health Services Why:  They will do your home health care at your home Contact information: [address] [place]  [address] [place] [address] [phone]          Allergies  Allergen Reactions  . Sulfamethoxazole Other (See Comments)    UNKNOWN    Consultations:     Procedures/Studies: Dg Hip Unilat W Or Wo Pelvis 2-3 Views Left  Result Date: 09/08/2017 CLINICAL DATA:  Loss of consciousness today experienced with fall. Left hip pain. EXAM: DG HIP (WITH OR WITHOUT PELVIS) 2-3V LEFT COMPARISON:  Pelvic radiograph from 03/26/2014, CT 09/25/2013. FINDINGS: AP view the pelvis and AP and frog-leg views of the left hip are provided. Similar appearing axial joint space narrowing of both hips. No joint dislocation is identified. Small osteophyte along the posterior inferior aspect of the left femoral head -neck junction is noted about on the frog-leg lateral view of the left femur. This has a similar appearance to that of the prior CT from 2014. Therefore it is not felt to represent a cortical fracture. Aorto bi-iliac stents are noted. The bony pelvis appears intact. The arcuate lines of the sacrum appear intact. No diastasis of the pelvis. IMPRESSION: 1. No acute displaced hip fracture. Degenerative joint space narrowing of both hips. If the patient has pain out of proportion to radiographic findings, CT would be the next imaging study of choice for further assessment. 2. Small osteophyte at the femoral head-neck junction. 3. Intact bony pelvis. Electronically Signed   By: Ashley Royalty M.D.   On: 09/08/2017 15:10    (Echo, Carotid, EGD, Colonoscopy, ERCP)    Subjective:   Discharge Exam: Vitals:   09/08/17 2024 09/09/17 0734  BP: (!) 143/80 129/87  Pulse: 78 70  Resp: 20   Temp: 97.7 F (36.5 C) 98.7 F (37.1 C)  SpO2: 98% 95%   Vitals:   09/08/17 1930 09/08/17 2024 09/08/17  2037 09/09/17 0734  BP: 124/81 (!) 143/80  129/87  Pulse: 81 78  70  Resp: (!) 24 20    Temp:  97.7 F (36.5 C)  98.7 F (37.1 C)  TempSrc:  Oral  Oral  SpO2: 94% 98%  95%  Weight:   84.3 kg (185 lb 12.8 oz) 83.8 kg  (184 lb 11.2 oz)  Height:        General: Pt is alert, awake, not in acute distress Cardiovascular: RRR, S1/S2 +, no rubs, no gallops Respiratory: CTA bilaterally, no wheezing, no rhonchi Abdominal: Soft, NT, ND, bowel sounds + Extremities: no edema, no cyanosis    The results of significant diagnostics from this hospitalization (including imaging, microbiology, ancillary and laboratory) are listed below for reference.     Microbiology: No results found for this or any previous visit (from the past 240 hour(s)).   Labs: BNP (last 3 results) No results for input(s): BNP in the last 8760 hours. Basic Metabolic Panel: Recent Labs  Lab 09/08/17 1252 09/09/17 0431  NA 139 138  K 4.6 4.2  CL 107 106  CO2 24 24  GLUCOSE 113* 100*  BUN 14 19  CREATININE 1.08 1.06  CALCIUM 8.8* 8.9   Liver Function Tests: No results for input(s): AST, ALT, ALKPHOS, BILITOT, PROT, ALBUMIN in the last 168 hours. No results for input(s): LIPASE, AMYLASE in the last 168 hours. No results for input(s): AMMONIA in the last 168 hours. CBC: Recent Labs  Lab 09/08/17 1252 09/09/17 0431  WBC 8.3 7.7  HGB 15.8 14.4  HCT 47.7 44.0  MCV 91.2 90.2  PLT 289 271   Cardiac Enzymes: Recent Labs  Lab 09/08/17 1640 09/08/17 2121  TROPONINI <0.03 <0.03   BNP: Invalid input(s): POCBNP CBG: Recent Labs  Lab 09/08/17 1306  GLUCAP 102*   D-Dimer No results for input(s): DDIMER in the last 72 hours. Hgb A1c No results for input(s): HGBA1C in the last 72 hours. Lipid Profile No results for input(s): CHOL, HDL, LDLCALC, TRIG, CHOLHDL, LDLDIRECT in the last 72 hours. Thyroid function studies No results for input(s): TSH, T4TOTAL, T3FREE, THYROIDAB in the last 72 hours.  Invalid input(s): FREET3 Anemia work up No results for input(s): VITAMINB12, FOLATE, FERRITIN, TIBC, IRON, RETICCTPCT in the last 72 hours. Urinalysis    Component Value Date/Time   COLORURINE YELLOW 09/08/2017 1515    APPEARANCEUR CLEAR 09/08/2017 1515   LABSPEC 1.019 09/08/2017 1515   PHURINE 6.0 09/08/2017 1515   GLUCOSEU NEGATIVE 09/08/2017 1515   HGBUR NEGATIVE 09/08/2017 [place] 09/08/2017 1515   KETONESUR NEGATIVE 09/08/2017 1515   PROTEINUR NEGATIVE 09/08/2017 1515   UROBILINOGEN 1.0 07/26/2015 1745   NITRITE NEGATIVE 09/08/2017 1515   LEUKOCYTESUR NEGATIVE 09/08/2017 1515   Sepsis Labs Invalid input(s): PROCALCITONIN,  WBC,  LACTICIDVEN Microbiology No results found for this or any previous visit (from the past 240 hour(s)).   Time coordinating discharge: Over 30 minutes  SIGNED:   Georgette Shell, MD  [facility] 09/09/2017, 2:28 PM Pager   If 7PM-7AM, please contact night-coverage [email or web address] Password [email or web address]

## 2017-09-09 NOTE — Evaluation (Signed)
Physical Therapy Evaluation Patient Details Name: Carl Rios MRN: [id number] DOB: 05-04-1926 Today's Date: 09/09/2017   History of Present Illness  Pt is an 83 y.o. male admitted on 09/08/17 from [facility] after c/o feeling dizzy, falling and hitting his lower back; pt worked up for syncope. Pertinent PMH includes CVA (2015), rhabdomyolysis, recurrent falls, dizziness, HTN, PVD, AAA, peripheral neuropathy, macular degeneration, HOH.    Clinical Impression  Pt presents with an overall decrease in functional mobility secondary to above. PTA, pt resides at [facility]; was mod indep with RW and all ADLs (meals provided), but endorses multiple falls in the past year of unknown cause. Pt required min guard for transfers and short amb with RW. Demonstrates significant increased fall risk secondary to generalized weakness, memory impairments, and decreased balance. Discussed need for SNF-level therapies prior to return to [place]; pt in agreement with this. Pt would benefit from continued acute PT services to maximize functional mobility and independence prior to d/c to SNF.     Follow Up Recommendations SNF;Supervision for mobility/OOB    Equipment Recommendations  None recommended by PT    Recommendations for Other Services       Precautions / Restrictions Precautions Precautions: Fall Restrictions Weight Bearing Restrictions: No      Mobility  Bed Mobility Overal bed mobility: Needs Assistance Bed Mobility: Rolling;Sidelying to Sit Rolling: Min guard Sidelying to sit: Mod assist       General bed mobility comments: ModA to assist trunk into sitting; pt able to scoot to EOB. TotalA to don socks  Transfers Overall transfer level: Needs assistance Equipment used: Rolling walker (2 wheeled) Transfers: Sit to/from Stand Sit to Stand: Min guard         General transfer comment: Able to stand on third attempt with RW and min guard for  balance  Ambulation/Gait Ambulation/Gait assistance: Min guard Ambulation Distance (Feet): 20 Feet Assistive device: Rolling walker (2 wheeled) Gait Pattern/deviations: Step-through pattern;Decreased stride length;Trunk flexed Gait velocity: Decreased Gait velocity interpretation: <1.8 ft/sec, indicative of risk for recurrent falls General Gait Details: Slow amb in room with RW and close min guard for balance. Pt declining further distance secondary to "this is my first time up" and arrival of breakfast, despite max encouragement. Feel pt could amb further with a close min guard and rest breaks  Stairs            Wheelchair Mobility    Modified Rankin (Stroke Patients Only)       Balance Overall balance assessment: Needs assistance Sitting-balance support: No upper extremity supported;Feet supported Sitting balance-Leahy Scale: Fair Sitting balance - Comments: Unable to don socks sitting EOB   Standing balance support: Bilateral upper extremity supported;During functional activity Standing balance-Leahy Scale: Poor Standing balance comment: Reliant on BUE support                             Pertinent Vitals/Pain Pain Assessment: Faces Faces Pain Scale: Hurts a little bit Pain Location: Lower back since fall Pain Descriptors / Indicators: Sore;Grimacing Pain Intervention(s): Monitored during session    Home Living Family/patient expects to be discharged to:: Assisted living               Home Equipment: Kasandra Knudsen - single point;Walker - 2 wheels      Prior Function Level of Independence: Independent with assistive device(s)         Comments: Mod indep with RW. Lives  alone in condo at [facility]; meals provided, pt indep with ADLs, mod indep with amb around facility with RW.      Hand Dominance   Dominant Hand: Right    Extremity/Trunk Assessment   Upper Extremity Assessment Upper Extremity Assessment: Generalized weakness     Lower Extremity Assessment Lower Extremity Assessment: Generalized weakness    Cervical / Trunk Assessment Cervical / Trunk Assessment: Kyphotic  Communication   Communication: HOH  Cognition Arousal/Alertness: Awake/alert Behavior During Therapy: WFL for tasks assessed/performed Overall Cognitive Status: No family/caregiver present to determine baseline cognitive functioning Area of Impairment: Memory;Following commands;Problem solving                     Memory: Decreased short-term memory Following Commands: Follows one step commands with increased time     Problem Solving: Slow processing General Comments: Unsure pt's baseline cognition. Very HOH which may be biasing apparent cognitive deficits. Pt required repeated cues and appears to have short-term memory loss (i.e. discussed need for SNF and pt in agreement; but then referring about going back to his condo, etc.). Unsure if pt will recall our conversation regarding need for SNF. Appropriately interactive throughout session and very pleasant      General Comments General comments (skin integrity, edema, etc.): SpO2 >90% on RA; pt asymptomatic throughout session, denies dizziness    Exercises     Assessment/Plan    PT Assessment Patient needs continued PT services  PT Problem List Decreased strength;Decreased range of motion;Decreased activity tolerance;Decreased balance;Decreased mobility;Decreased cognition;Decreased knowledge of use of DME;Decreased knowledge of precautions;Decreased skin integrity       PT Treatment Interventions DME instruction;Gait training;Stair training;Functional mobility training;Therapeutic activities;Therapeutic exercise;Balance training;Patient/family education    PT Goals (Current goals can be found in the Care Plan section)  Acute Rehab PT Goals Patient Stated Goal: Stop falling PT Goal Formulation: With patient Time For Goal Achievement: 09/23/17 Potential to Achieve Goals:  Fair    Frequency Min 2X/week   Barriers to discharge        Co-evaluation               AM-PAC PT "6 Clicks" Daily Activity  Outcome Measure Difficulty turning over in bed (including adjusting bedclothes, sheets and blankets)?: A Little Difficulty moving from lying on back to sitting on the side of the bed? : Unable Difficulty sitting down on and standing up from a chair with arms (e.g., wheelchair, bedside commode, etc,.)?: A Little Help needed moving to and from a bed to chair (including a wheelchair)?: A Little Help needed walking in hospital room?: A Little Help needed climbing 3-5 steps with a railing? : A Lot 6 Click Score: 15    End of Session Equipment Utilized During Treatment: Gait belt Activity Tolerance: Patient tolerated treatment well Patient left: in chair;with call bell/phone within reach Nurse Communication: Mobility status;Other (comment)(Left O2 [place] off) PT Visit Diagnosis: Other abnormalities of gait and mobility (R26.89);Muscle weakness (generalized) (M62.81);History of falling (Z91.81)    Time: 7782-4235 PT Time Calculation (min) (ACUTE ONLY): 25 min   Charges:   PT Evaluation $PT Eval Moderate Complexity: 1 Mod PT Treatments $Therapeutic Activity: 8-22 mins   PT G Codes:   PT G-Codes **NOT FOR INPATIENT CLASS** Functional Assessment Tool Used: AM-PAC 6 Clicks Basic Mobility Functional Limitation: Mobility: Walking and moving around Mobility: Walking and Moving Around Current Status (T6144): At least 40 percent but less than 60 percent impaired, limited or restricted Mobility: Walking and Moving  Around Goal Status [phone]): At least 1 percent but less than 20 percent impaired, limited or restricted   Mabeline Caras, PT, DPT Acute Rehab Services  Pager: [place] 09/09/2017, 8:54 AM

## 2017-09-09 NOTE — Progress Notes (Deleted)
PROGRESS NOTE    Carl Rios  DJM:[id number] DOB: 03-16-1926 DOA: 09/08/2017 PCP: Marton Redwood, MD  Brief Narrative:  83 y.o. male past medical history relevant for hypertension, chronic dizziness and recurrent falls as well as hearing loss who presents to the ED from assisted living facility after being found down.  Patient is not the greatest historian, the fall was unwitnessed, questions about possible syncope as per EMS.  patient states that he was very dizzy and he fell, denies loss of consciousness, denies chest pains palpitations or shortness of breath, there was no tongue biting no incontinence   In the ED patient is in no acute distress, very very hard of hearing, orthostatic vitals noted (Laying down SBP 132/78, HR 65; sitting down blood pressure down to 104/73, heart rate of 74).   No fever or chills, no productive cough, no vomiting or diarrhea.  he denies headaches,double vision.    Assessment & Plan:   Principal Problem:   Syncope and collapse Active Problems:   Hypertension   Abnormality of gait   Recurrent falls   Pressure injury of skin  1]recurrent falls due to orthostatic hypotension.cardiac work up negative.carotid ultrasound no hemodynamically significant stenosis.will try small dose midodrine and ted hose to help with orthostatics.pt eval noted for SNF. 2]history of CVA 2015 3]pERIPHERAL NEUROPATHY 4]Macular degenaration  DVT prophylaxis:scd  Code Status: full Family Communication:none Disposition Plan snf  Consultants:   none none Procedures: none Antimicrobials: none  Subjective:feels better now  Objective:resting in bed  Vitals:   09/08/17 1930 09/08/17 2024 09/08/17 2037 09/09/17 0734  BP: 124/81 (!) 143/80  129/87  Pulse: 81 78  70  Resp: (!) 24 20    Temp:  97.7 F (36.5 C)  98.7 F (37.1 C)  TempSrc:  Oral  Oral  SpO2: 94% 98%  95%  Weight:   84.3 kg (185 lb 12.8 oz) 83.8 kg (184 lb 11.2 oz)  Height:       No intake or  output data in the 24 hours ending 09/09/17 1037 Filed Weights   09/08/17 1215 09/08/17 2037 09/09/17 0734  Weight: 85.7 kg (189 lb) 84.3 kg (185 lb 12.8 oz) 83.8 kg (184 lb 11.2 oz)    Examination:  General exam: Appears calm and comfortable  Respiratory system: Clear to auscultation. Respiratory effort normal. Cardiovascular system: S1 & S2 heard, RRR. No JVD, murmurs, rubs, gallops or clicks. No pedal edema. Gastrointestinal system: Abdomen is nondistended, soft and nontender. No organomegaly or masses felt. Normal bowel sounds heard. Central nervous system: Alert and oriented. No focal neurological deficits. Extremities: Symmetric 5 x 5 power. Skin: No rashes, lesions or ulcers Psychiatry: Judgement and insight appear normal. Mood & affect appropriate.     Data Reviewed: I have personally reviewed following labs and imaging studies  CBC: Recent Labs  Lab 09/08/17 1252 09/09/17 0431  WBC 8.3 7.7  HGB 15.8 14.4  HCT 47.7 44.0  MCV 91.2 90.2  PLT 289 222   Basic Metabolic Panel: Recent Labs  Lab 09/08/17 1252 09/09/17 0431  NA 139 138  K 4.6 4.2  CL 107 106  CO2 24 24  GLUCOSE 113* 100*  BUN 14 19  CREATININE 1.08 1.06  CALCIUM 8.8* 8.9   GFR: Estimated Creatinine Clearance: 47.2 mL/min (by C-G formula based on SCr of 1.06 mg/dL). Liver Function Tests: No results for input(s): AST, ALT, ALKPHOS, BILITOT, PROT, ALBUMIN in the last 168 hours. No results for input(s): LIPASE, AMYLASE in the last  168 hours. No results for input(s): AMMONIA in the last 168 hours. Coagulation Profile: No results for input(s): INR, PROTIME in the last 168 hours. Cardiac Enzymes: Recent Labs  Lab 09/08/17 1640 09/08/17 2121  TROPONINI <0.03 <0.03   BNP (last 3 results) No results for input(s): PROBNP in the last 8760 hours. HbA1C: No results for input(s): HGBA1C in the last 72 hours. CBG: Recent Labs  Lab 09/08/17 1306  GLUCAP 102*   Lipid Profile: No results for  input(s): CHOL, HDL, LDLCALC, TRIG, CHOLHDL, LDLDIRECT in the last 72 hours. Thyroid Function Tests: No results for input(s): TSH, T4TOTAL, FREET4, T3FREE, THYROIDAB in the last 72 hours. Anemia Panel: No results for input(s): VITAMINB12, FOLATE, FERRITIN, TIBC, IRON, RETICCTPCT in the last 72 hours. Sepsis Labs: No results for input(s): PROCALCITON, LATICACIDVEN in the last 168 hours.  No results found for this or any previous visit (from the past 240 hour(s)).       Radiology Studies: Dg Hip Unilat W Or Wo Pelvis 2-3 Views Left  Result Date: 09/08/2017 CLINICAL DATA:  Loss of consciousness today experienced with fall. Left hip pain. EXAM: DG HIP (WITH OR WITHOUT PELVIS) 2-3V LEFT COMPARISON:  Pelvic radiograph from 03/26/2014, CT 09/25/2013. FINDINGS: AP view the pelvis and AP and frog-leg views of the left hip are provided. Similar appearing axial joint space narrowing of both hips. No joint dislocation is identified. Small osteophyte along the posterior inferior aspect of the left femoral head -neck junction is noted about on the frog-leg lateral view of the left femur. This has a similar appearance to that of the prior CT from 2014. Therefore it is not felt to represent a cortical fracture. Aorto bi-iliac stents are noted. The bony pelvis appears intact. The arcuate lines of the sacrum appear intact. No diastasis of the pelvis. IMPRESSION: 1. No acute displaced hip fracture. Degenerative joint space narrowing of both hips. If the patient has pain out of proportion to radiographic findings, CT would be the next imaging study of choice for further assessment. 2. Small osteophyte at the femoral head-neck junction. 3. Intact bony pelvis. Electronically Signed   By: Ashley Royalty M.D.   On: 09/08/2017 15:10        Scheduled Meds: . aspirin EC  81 mg Oral Daily  . DULoxetine  30 mg Oral Daily  . heparin  5,000 Units Subcutaneous Q8H  . metoprolol tartrate  12.5 mg Oral BID  . pantoprazole   40 mg Oral Daily  . senna  1 tablet Oral BID  . sodium chloride flush  3 mL Intravenous Q12H  . Vitamin D (Ergocalciferol)  50,000 Units Oral Q7 days   Continuous Infusions: . sodium chloride       LOS: 0 days    Georgette Shell, MD [facility] If 7PM-7AM, please contact night-coverage [email or web address] Password [email or web address] 09/09/2017, 10:37 AM

## 2017-09-09 NOTE — Clinical Social Work Note (Signed)
PT recommending SNF placement once stable for discharge. Patient currently under observation status. Patient would have to be under inpatient status for at least 3 days before insurance would pay for SNF placement. He does not have a qualifying stay within the past 30 days either. CSW will continue to follow in the event that patient is switched to inpatient status and has a qualifying stay.  Carl Rios, [place]

## 2017-09-09 NOTE — Care Management Note (Signed)
Case Management Note  Patient Details  Name: MIKAL BLASDELL MRN: [id number] Date of Birth: 03/04/1926  Subjective/Objective: Syncope                 Action/Plan: Patient is a resident at [facility]; CM talked to patient's son Ron to offer [place] choices, Ron chose [facility]; [place] with [facility] called for arrangements; patient is for discharge home today  Expected Discharge Date:  09/09/17               Expected Discharge Plan:  [place]  Discharge planning Services  CM Consult  HH Arranged:  RN, PT, Nurse's Aide [place] Agency:  [facility] (now [facility])  Status of Service:  In process, will continue to follow  Sherrilyn Rist [phone] 09/09/2017, 2:22 PM

## 2017-09-10 DIAGNOSIS — R296 Repeated falls: Secondary | ICD-10-CM | POA: Diagnosis not present

## 2017-09-10 DIAGNOSIS — H3530 Unspecified macular degeneration: Secondary | ICD-10-CM | POA: Diagnosis not present

## 2017-09-10 DIAGNOSIS — G629 Polyneuropathy, unspecified: Secondary | ICD-10-CM | POA: Diagnosis not present

## 2017-09-10 DIAGNOSIS — I10 Essential (primary) hypertension: Secondary | ICD-10-CM | POA: Diagnosis not present

## 2017-09-10 DIAGNOSIS — G6289 Other specified polyneuropathies: Secondary | ICD-10-CM | POA: Diagnosis not present

## 2017-09-10 DIAGNOSIS — F3289 Other specified depressive episodes: Secondary | ICD-10-CM | POA: Diagnosis not present

## 2017-09-10 DIAGNOSIS — I714 Abdominal aortic aneurysm, without rupture: Secondary | ICD-10-CM | POA: Diagnosis not present

## 2017-09-10 DIAGNOSIS — M810 Age-related osteoporosis without current pathological fracture: Secondary | ICD-10-CM | POA: Diagnosis not present

## 2017-09-10 DIAGNOSIS — E668 Other obesity: Secondary | ICD-10-CM | POA: Diagnosis not present

## 2017-09-10 DIAGNOSIS — H9193 Unspecified hearing loss, bilateral: Secondary | ICD-10-CM | POA: Diagnosis not present

## 2017-09-10 DIAGNOSIS — I471 Supraventricular tachycardia: Secondary | ICD-10-CM | POA: Diagnosis not present

## 2017-09-10 DIAGNOSIS — I7389 Other specified peripheral vascular diseases: Secondary | ICD-10-CM | POA: Diagnosis not present

## 2017-09-10 DIAGNOSIS — I959 Hypotension, unspecified: Secondary | ICD-10-CM | POA: Diagnosis not present

## 2017-09-10 DIAGNOSIS — I739 Peripheral vascular disease, unspecified: Secondary | ICD-10-CM | POA: Diagnosis not present

## 2017-09-14 DIAGNOSIS — I959 Hypotension, unspecified: Secondary | ICD-10-CM | POA: Diagnosis not present

## 2017-09-14 DIAGNOSIS — I471 Supraventricular tachycardia: Secondary | ICD-10-CM | POA: Diagnosis not present

## 2017-09-14 DIAGNOSIS — I739 Peripheral vascular disease, unspecified: Secondary | ICD-10-CM | POA: Diagnosis not present

## 2017-09-14 DIAGNOSIS — I10 Essential (primary) hypertension: Secondary | ICD-10-CM | POA: Diagnosis not present

## 2017-09-14 DIAGNOSIS — G629 Polyneuropathy, unspecified: Secondary | ICD-10-CM | POA: Diagnosis not present

## 2017-09-14 DIAGNOSIS — M810 Age-related osteoporosis without current pathological fracture: Secondary | ICD-10-CM | POA: Diagnosis not present

## 2017-09-15 DIAGNOSIS — I471 Supraventricular tachycardia: Secondary | ICD-10-CM | POA: Diagnosis not present

## 2017-09-15 DIAGNOSIS — M810 Age-related osteoporosis without current pathological fracture: Secondary | ICD-10-CM | POA: Diagnosis not present

## 2017-09-15 DIAGNOSIS — I10 Essential (primary) hypertension: Secondary | ICD-10-CM | POA: Diagnosis not present

## 2017-09-15 DIAGNOSIS — I959 Hypotension, unspecified: Secondary | ICD-10-CM | POA: Diagnosis not present

## 2017-09-15 DIAGNOSIS — G629 Polyneuropathy, unspecified: Secondary | ICD-10-CM | POA: Diagnosis not present

## 2017-09-15 DIAGNOSIS — I739 Peripheral vascular disease, unspecified: Secondary | ICD-10-CM | POA: Diagnosis not present

## 2017-09-16 DIAGNOSIS — I471 Supraventricular tachycardia: Secondary | ICD-10-CM | POA: Diagnosis not present

## 2017-09-16 DIAGNOSIS — I10 Essential (primary) hypertension: Secondary | ICD-10-CM | POA: Diagnosis not present

## 2017-09-16 DIAGNOSIS — M810 Age-related osteoporosis without current pathological fracture: Secondary | ICD-10-CM | POA: Diagnosis not present

## 2017-09-16 DIAGNOSIS — G629 Polyneuropathy, unspecified: Secondary | ICD-10-CM | POA: Diagnosis not present

## 2017-09-16 DIAGNOSIS — I959 Hypotension, unspecified: Secondary | ICD-10-CM | POA: Diagnosis not present

## 2017-09-16 DIAGNOSIS — I739 Peripheral vascular disease, unspecified: Secondary | ICD-10-CM | POA: Diagnosis not present

## 2017-09-19 DIAGNOSIS — I10 Essential (primary) hypertension: Secondary | ICD-10-CM | POA: Diagnosis not present

## 2017-09-20 DIAGNOSIS — I739 Peripheral vascular disease, unspecified: Secondary | ICD-10-CM | POA: Diagnosis not present

## 2017-09-20 DIAGNOSIS — G629 Polyneuropathy, unspecified: Secondary | ICD-10-CM | POA: Diagnosis not present

## 2017-09-20 DIAGNOSIS — I959 Hypotension, unspecified: Secondary | ICD-10-CM | POA: Diagnosis not present

## 2017-09-20 DIAGNOSIS — I471 Supraventricular tachycardia: Secondary | ICD-10-CM | POA: Diagnosis not present

## 2017-09-20 DIAGNOSIS — I10 Essential (primary) hypertension: Secondary | ICD-10-CM | POA: Diagnosis not present

## 2017-09-20 DIAGNOSIS — M810 Age-related osteoporosis without current pathological fracture: Secondary | ICD-10-CM | POA: Diagnosis not present

## 2017-09-21 DIAGNOSIS — I959 Hypotension, unspecified: Secondary | ICD-10-CM | POA: Diagnosis not present

## 2017-09-21 DIAGNOSIS — I471 Supraventricular tachycardia: Secondary | ICD-10-CM | POA: Diagnosis not present

## 2017-09-21 DIAGNOSIS — I10 Essential (primary) hypertension: Secondary | ICD-10-CM | POA: Diagnosis not present

## 2017-09-21 DIAGNOSIS — G629 Polyneuropathy, unspecified: Secondary | ICD-10-CM | POA: Diagnosis not present

## 2017-09-21 DIAGNOSIS — M810 Age-related osteoporosis without current pathological fracture: Secondary | ICD-10-CM | POA: Diagnosis not present

## 2017-09-21 DIAGNOSIS — I739 Peripheral vascular disease, unspecified: Secondary | ICD-10-CM | POA: Diagnosis not present

## 2017-09-23 DIAGNOSIS — I471 Supraventricular tachycardia: Secondary | ICD-10-CM | POA: Diagnosis not present

## 2017-09-23 DIAGNOSIS — G629 Polyneuropathy, unspecified: Secondary | ICD-10-CM | POA: Diagnosis not present

## 2017-09-23 DIAGNOSIS — M810 Age-related osteoporosis without current pathological fracture: Secondary | ICD-10-CM | POA: Diagnosis not present

## 2017-09-23 DIAGNOSIS — I739 Peripheral vascular disease, unspecified: Secondary | ICD-10-CM | POA: Diagnosis not present

## 2017-09-23 DIAGNOSIS — I959 Hypotension, unspecified: Secondary | ICD-10-CM | POA: Diagnosis not present

## 2017-09-23 DIAGNOSIS — I10 Essential (primary) hypertension: Secondary | ICD-10-CM | POA: Diagnosis not present

## 2017-09-26 DIAGNOSIS — I10 Essential (primary) hypertension: Secondary | ICD-10-CM | POA: Diagnosis not present

## 2017-09-26 DIAGNOSIS — G629 Polyneuropathy, unspecified: Secondary | ICD-10-CM | POA: Diagnosis not present

## 2017-09-26 DIAGNOSIS — I959 Hypotension, unspecified: Secondary | ICD-10-CM | POA: Diagnosis not present

## 2017-09-26 DIAGNOSIS — I739 Peripheral vascular disease, unspecified: Secondary | ICD-10-CM | POA: Diagnosis not present

## 2017-09-26 DIAGNOSIS — M810 Age-related osteoporosis without current pathological fracture: Secondary | ICD-10-CM | POA: Diagnosis not present

## 2017-09-26 DIAGNOSIS — I471 Supraventricular tachycardia: Secondary | ICD-10-CM | POA: Diagnosis not present

## 2017-09-27 DIAGNOSIS — G629 Polyneuropathy, unspecified: Secondary | ICD-10-CM | POA: Diagnosis not present

## 2017-09-27 DIAGNOSIS — I739 Peripheral vascular disease, unspecified: Secondary | ICD-10-CM | POA: Diagnosis not present

## 2017-09-27 DIAGNOSIS — I959 Hypotension, unspecified: Secondary | ICD-10-CM | POA: Diagnosis not present

## 2017-09-27 DIAGNOSIS — I471 Supraventricular tachycardia: Secondary | ICD-10-CM | POA: Diagnosis not present

## 2017-09-27 DIAGNOSIS — I10 Essential (primary) hypertension: Secondary | ICD-10-CM | POA: Diagnosis not present

## 2017-09-27 DIAGNOSIS — M810 Age-related osteoporosis without current pathological fracture: Secondary | ICD-10-CM | POA: Diagnosis not present

## 2017-09-29 DIAGNOSIS — I471 Supraventricular tachycardia: Secondary | ICD-10-CM | POA: Diagnosis not present

## 2017-09-29 DIAGNOSIS — I739 Peripheral vascular disease, unspecified: Secondary | ICD-10-CM | POA: Diagnosis not present

## 2017-09-29 DIAGNOSIS — I959 Hypotension, unspecified: Secondary | ICD-10-CM | POA: Diagnosis not present

## 2017-09-29 DIAGNOSIS — M810 Age-related osteoporosis without current pathological fracture: Secondary | ICD-10-CM | POA: Diagnosis not present

## 2017-09-29 DIAGNOSIS — G629 Polyneuropathy, unspecified: Secondary | ICD-10-CM | POA: Diagnosis not present

## 2017-09-29 DIAGNOSIS — I10 Essential (primary) hypertension: Secondary | ICD-10-CM | POA: Diagnosis not present

## 2017-09-30 DIAGNOSIS — M810 Age-related osteoporosis without current pathological fracture: Secondary | ICD-10-CM | POA: Diagnosis not present

## 2017-09-30 DIAGNOSIS — I739 Peripheral vascular disease, unspecified: Secondary | ICD-10-CM | POA: Diagnosis not present

## 2017-09-30 DIAGNOSIS — I471 Supraventricular tachycardia: Secondary | ICD-10-CM | POA: Diagnosis not present

## 2017-09-30 DIAGNOSIS — G629 Polyneuropathy, unspecified: Secondary | ICD-10-CM | POA: Diagnosis not present

## 2017-09-30 DIAGNOSIS — I959 Hypotension, unspecified: Secondary | ICD-10-CM | POA: Diagnosis not present

## 2017-09-30 DIAGNOSIS — I10 Essential (primary) hypertension: Secondary | ICD-10-CM | POA: Diagnosis not present

## 2017-10-03 DIAGNOSIS — G629 Polyneuropathy, unspecified: Secondary | ICD-10-CM | POA: Diagnosis not present

## 2017-10-03 DIAGNOSIS — I739 Peripheral vascular disease, unspecified: Secondary | ICD-10-CM | POA: Diagnosis not present

## 2017-10-03 DIAGNOSIS — M810 Age-related osteoporosis without current pathological fracture: Secondary | ICD-10-CM | POA: Diagnosis not present

## 2017-10-03 DIAGNOSIS — I471 Supraventricular tachycardia: Secondary | ICD-10-CM | POA: Diagnosis not present

## 2017-10-03 DIAGNOSIS — I959 Hypotension, unspecified: Secondary | ICD-10-CM | POA: Diagnosis not present

## 2017-10-03 DIAGNOSIS — I10 Essential (primary) hypertension: Secondary | ICD-10-CM | POA: Diagnosis not present

## 2017-10-04 DIAGNOSIS — M810 Age-related osteoporosis without current pathological fracture: Secondary | ICD-10-CM | POA: Diagnosis not present

## 2017-10-04 DIAGNOSIS — I471 Supraventricular tachycardia: Secondary | ICD-10-CM | POA: Diagnosis not present

## 2017-10-04 DIAGNOSIS — I959 Hypotension, unspecified: Secondary | ICD-10-CM | POA: Diagnosis not present

## 2017-10-04 DIAGNOSIS — I10 Essential (primary) hypertension: Secondary | ICD-10-CM | POA: Diagnosis not present

## 2017-10-04 DIAGNOSIS — G629 Polyneuropathy, unspecified: Secondary | ICD-10-CM | POA: Diagnosis not present

## 2017-10-04 DIAGNOSIS — I739 Peripheral vascular disease, unspecified: Secondary | ICD-10-CM | POA: Diagnosis not present

## 2017-10-06 DIAGNOSIS — M810 Age-related osteoporosis without current pathological fracture: Secondary | ICD-10-CM | POA: Diagnosis not present

## 2017-10-06 DIAGNOSIS — I739 Peripheral vascular disease, unspecified: Secondary | ICD-10-CM | POA: Diagnosis not present

## 2017-10-06 DIAGNOSIS — I471 Supraventricular tachycardia: Secondary | ICD-10-CM | POA: Diagnosis not present

## 2017-10-06 DIAGNOSIS — I959 Hypotension, unspecified: Secondary | ICD-10-CM | POA: Diagnosis not present

## 2017-10-06 DIAGNOSIS — G629 Polyneuropathy, unspecified: Secondary | ICD-10-CM | POA: Diagnosis not present

## 2017-10-06 DIAGNOSIS — I10 Essential (primary) hypertension: Secondary | ICD-10-CM | POA: Diagnosis not present

## 2017-10-07 DIAGNOSIS — I471 Supraventricular tachycardia: Secondary | ICD-10-CM | POA: Diagnosis not present

## 2017-10-07 DIAGNOSIS — G629 Polyneuropathy, unspecified: Secondary | ICD-10-CM | POA: Diagnosis not present

## 2017-10-07 DIAGNOSIS — M810 Age-related osteoporosis without current pathological fracture: Secondary | ICD-10-CM | POA: Diagnosis not present

## 2017-10-07 DIAGNOSIS — I959 Hypotension, unspecified: Secondary | ICD-10-CM | POA: Diagnosis not present

## 2017-10-07 DIAGNOSIS — I10 Essential (primary) hypertension: Secondary | ICD-10-CM | POA: Diagnosis not present

## 2017-10-07 DIAGNOSIS — I739 Peripheral vascular disease, unspecified: Secondary | ICD-10-CM | POA: Diagnosis not present

## 2017-10-08 DIAGNOSIS — M25552 Pain in left hip: Secondary | ICD-10-CM | POA: Diagnosis not present

## 2017-10-08 DIAGNOSIS — R55 Syncope and collapse: Secondary | ICD-10-CM | POA: Diagnosis not present

## 2017-10-08 DIAGNOSIS — Z9181 History of falling: Secondary | ICD-10-CM | POA: Diagnosis not present

## 2017-10-08 DIAGNOSIS — R2689 Other abnormalities of gait and mobility: Secondary | ICD-10-CM | POA: Diagnosis not present

## 2017-10-08 DIAGNOSIS — I10 Essential (primary) hypertension: Secondary | ICD-10-CM | POA: Diagnosis not present

## 2017-10-11 DIAGNOSIS — I471 Supraventricular tachycardia: Secondary | ICD-10-CM | POA: Diagnosis not present

## 2017-10-11 DIAGNOSIS — G629 Polyneuropathy, unspecified: Secondary | ICD-10-CM | POA: Diagnosis not present

## 2017-10-11 DIAGNOSIS — I739 Peripheral vascular disease, unspecified: Secondary | ICD-10-CM | POA: Diagnosis not present

## 2017-10-11 DIAGNOSIS — M810 Age-related osteoporosis without current pathological fracture: Secondary | ICD-10-CM | POA: Diagnosis not present

## 2017-10-11 DIAGNOSIS — I959 Hypotension, unspecified: Secondary | ICD-10-CM | POA: Diagnosis not present

## 2017-10-11 DIAGNOSIS — I10 Essential (primary) hypertension: Secondary | ICD-10-CM | POA: Diagnosis not present

## 2017-10-12 DIAGNOSIS — G629 Polyneuropathy, unspecified: Secondary | ICD-10-CM | POA: Diagnosis not present

## 2017-10-12 DIAGNOSIS — I739 Peripheral vascular disease, unspecified: Secondary | ICD-10-CM | POA: Diagnosis not present

## 2017-10-12 DIAGNOSIS — I10 Essential (primary) hypertension: Secondary | ICD-10-CM | POA: Diagnosis not present

## 2017-10-12 DIAGNOSIS — I959 Hypotension, unspecified: Secondary | ICD-10-CM | POA: Diagnosis not present

## 2017-10-12 DIAGNOSIS — M810 Age-related osteoporosis without current pathological fracture: Secondary | ICD-10-CM | POA: Diagnosis not present

## 2017-10-12 DIAGNOSIS — I471 Supraventricular tachycardia: Secondary | ICD-10-CM | POA: Diagnosis not present

## 2017-10-14 DIAGNOSIS — I471 Supraventricular tachycardia: Secondary | ICD-10-CM | POA: Diagnosis not present

## 2017-10-14 DIAGNOSIS — I739 Peripheral vascular disease, unspecified: Secondary | ICD-10-CM | POA: Diagnosis not present

## 2017-10-14 DIAGNOSIS — G629 Polyneuropathy, unspecified: Secondary | ICD-10-CM | POA: Diagnosis not present

## 2017-10-14 DIAGNOSIS — I10 Essential (primary) hypertension: Secondary | ICD-10-CM | POA: Diagnosis not present

## 2017-10-14 DIAGNOSIS — M810 Age-related osteoporosis without current pathological fracture: Secondary | ICD-10-CM | POA: Diagnosis not present

## 2017-10-14 DIAGNOSIS — I959 Hypotension, unspecified: Secondary | ICD-10-CM | POA: Diagnosis not present

## 2017-10-16 ENCOUNTER — Encounter (HOSPITAL_COMMUNITY): Payer: Self-pay | Admitting: Emergency Medicine

## 2017-10-16 ENCOUNTER — Inpatient Hospital Stay (HOSPITAL_COMMUNITY)
Admission: EM | Admit: 2017-10-16 | Discharge: 2017-10-20 | DRG: 312 | Disposition: A | Discharge status: Skilled Nursing Facility | Payer: Medicare Other | Attending: Internal Medicine | Admitting: Internal Medicine

## 2017-10-16 DIAGNOSIS — T148XXA Other injury of unspecified body region, initial encounter: Secondary | ICD-10-CM | POA: Diagnosis not present

## 2017-10-16 DIAGNOSIS — W19XXXA Unspecified fall, initial encounter: Secondary | ICD-10-CM

## 2017-10-16 DIAGNOSIS — G912 (Idiopathic) normal pressure hydrocephalus: Secondary | ICD-10-CM | POA: Diagnosis not present

## 2017-10-16 DIAGNOSIS — Z8673 Personal history of transient ischemic attack (TIA), and cerebral infarction without residual deficits: Secondary | ICD-10-CM | POA: Diagnosis not present

## 2017-10-16 DIAGNOSIS — R2681 Unsteadiness on feet: Secondary | ICD-10-CM | POA: Diagnosis present

## 2017-10-16 DIAGNOSIS — H9190 Unspecified hearing loss, unspecified ear: Secondary | ICD-10-CM | POA: Diagnosis present

## 2017-10-16 DIAGNOSIS — H3530 Unspecified macular degeneration: Secondary | ICD-10-CM | POA: Diagnosis present

## 2017-10-16 DIAGNOSIS — R269 Unspecified abnormalities of gait and mobility: Secondary | ICD-10-CM | POA: Diagnosis not present

## 2017-10-16 DIAGNOSIS — I739 Peripheral vascular disease, unspecified: Secondary | ICD-10-CM | POA: Diagnosis present

## 2017-10-16 DIAGNOSIS — R42 Dizziness and giddiness: Secondary | ICD-10-CM

## 2017-10-16 DIAGNOSIS — I4719 Other supraventricular tachycardia: Secondary | ICD-10-CM

## 2017-10-16 DIAGNOSIS — F32A Depression, unspecified: Secondary | ICD-10-CM | POA: Diagnosis present

## 2017-10-16 DIAGNOSIS — I951 Orthostatic hypotension: Secondary | ICD-10-CM | POA: Diagnosis not present

## 2017-10-16 DIAGNOSIS — Z7982 Long term (current) use of aspirin: Secondary | ICD-10-CM

## 2017-10-16 DIAGNOSIS — I10 Essential (primary) hypertension: Secondary | ICD-10-CM | POA: Diagnosis present

## 2017-10-16 DIAGNOSIS — I471 Supraventricular tachycardia: Secondary | ICD-10-CM

## 2017-10-16 DIAGNOSIS — R2689 Other abnormalities of gait and mobility: Secondary | ICD-10-CM | POA: Diagnosis not present

## 2017-10-16 DIAGNOSIS — E871 Hypo-osmolality and hyponatremia: Secondary | ICD-10-CM | POA: Diagnosis present

## 2017-10-16 DIAGNOSIS — F329 Major depressive disorder, single episode, unspecified: Secondary | ICD-10-CM | POA: Diagnosis present

## 2017-10-16 DIAGNOSIS — F05 Delirium due to known physiological condition: Secondary | ICD-10-CM | POA: Diagnosis not present

## 2017-10-16 DIAGNOSIS — R296 Repeated falls: Secondary | ICD-10-CM

## 2017-10-16 DIAGNOSIS — F0390 Unspecified dementia without behavioral disturbance: Secondary | ICD-10-CM | POA: Diagnosis present

## 2017-10-16 DIAGNOSIS — Z8249 Family history of ischemic heart disease and other diseases of the circulatory system: Secondary | ICD-10-CM

## 2017-10-16 DIAGNOSIS — M546 Pain in thoracic spine: Secondary | ICD-10-CM | POA: Diagnosis not present

## 2017-10-16 DIAGNOSIS — I479 Paroxysmal tachycardia, unspecified: Secondary | ICD-10-CM | POA: Diagnosis not present

## 2017-10-16 LAB — BASIC METABOLIC PANEL
Anion gap: 11 (ref 5–15)
BUN: 15 mg/dL (ref 6–20)
CHLORIDE: 102 mmol/L (ref 101–111)
CO2: 21 mmol/L — AB (ref 22–32)
Calcium: 8.2 mg/dL — ABNORMAL LOW (ref 8.9–10.3)
Creatinine, Ser: 0.94 mg/dL (ref 0.61–1.24)
GFR calc Af Amer: >60 mL/min (ref >60)
GFR calc non Af Amer: >60 mL/min (ref >60)
Glucose, Bld: 82 mg/dL (ref 65–99)
POTASSIUM: 3.6 mmol/L (ref 3.5–5.1)
SODIUM: 134 mmol/L — AB (ref 135–145)

## 2017-10-16 LAB — CBC
HEMATOCRIT: 46.3 % (ref 39.0–52.0)
HEMOGLOBIN: 15.5 g/dL (ref 13.0–17.0)
MCH: 30.2 pg (ref 26.0–34.0)
MCHC: 33.5 g/dL (ref 30.0–36.0)
MCV: 90.3 fL (ref 78.0–100.0)
Platelets: 342 10*3/uL (ref 150–400)
RBC: 5.13 MIL/uL (ref 4.22–5.81)
RDW: 15 % (ref 11.5–15.5)
WBC: 11.2 10*3/uL — ABNORMAL HIGH (ref 4.0–10.5)

## 2017-10-16 LAB — I-STAT TROPONIN, ED: TROPONIN I, POC: 0 ng/mL (ref 0.00–0.08)

## 2017-10-16 LAB — CBG MONITORING, ED: Glucose-Capillary: 74 mg/dL (ref 65–99)

## 2017-10-16 MED ORDER — ACETAMINOPHEN 650 MG RE SUPP: 650.0000 mg | Freq: Four times a day (QID) | RECTAL | Status: DC | PRN

## 2017-10-16 MED ORDER — MIDODRINE HCL 2.5 MG PO TABS
2.5000 mg | ORAL_TABLET | Freq: Two times a day (BID) | ORAL | Status: DC
  Administered 2017-10-17 – 2017-10-20 (×6): 2.5 mg via ORAL
  Filled 2017-10-16 (×8): qty 1

## 2017-10-16 MED ORDER — LORATADINE 10 MG PO TABS
10.0000 mg | ORAL_TABLET | Freq: Every day | ORAL | Status: DC
Start: 2017-10-17 — End: 2017-10-20
  Administered 2017-10-17 – 2017-10-20 (×4): 10 mg via ORAL
  Filled 2017-10-16 (×4): qty 1

## 2017-10-16 MED ORDER — SODIUM CHLORIDE 0.9% FLUSH
3.0000 mL | Freq: Two times a day (BID) | INTRAVENOUS | Status: DC
  Administered 2017-10-17 – 2017-10-19 (×6): 3 mL via INTRAVENOUS

## 2017-10-16 MED ORDER — ASPIRIN EC 81 MG PO TBEC
81.0000 mg | DELAYED_RELEASE_TABLET | Freq: Every day | ORAL | Status: DC
  Administered 2017-10-17 – 2017-10-20 (×4): 81 mg via ORAL
  Filled 2017-10-16 (×4): qty 1

## 2017-10-16 MED ORDER — TAMSULOSIN HCL 0.4 MG PO CAPS: 0.4000 mg | ORAL_CAPSULE | Freq: Every day | ORAL | Status: DC

## 2017-10-16 MED ORDER — ONDANSETRON HCL 4 MG PO TABS: 4.0000 mg | ORAL_TABLET | Freq: Four times a day (QID) | ORAL | Status: DC | PRN

## 2017-10-16 MED ORDER — ENOXAPARIN SODIUM 40 MG/0.4ML ~~LOC~~ SOLN
40.0000 mg | SUBCUTANEOUS | Status: DC
  Administered 2017-10-17 – 2017-10-19 (×3): 40 mg via SUBCUTANEOUS
  Filled 2017-10-16 (×3): qty 0.4

## 2017-10-16 MED ORDER — METOPROLOL TARTRATE 5 MG/5ML IV SOLN
5.0000 mg | Freq: Once | INTRAVENOUS | Status: DC
  Filled 2017-10-16: qty 5

## 2017-10-16 MED ORDER — METOPROLOL TARTRATE 12.5 MG HALF TABLET
12.5000 mg | ORAL_TABLET | Freq: Two times a day (BID) | ORAL | Status: DC
Start: 2017-10-17 — End: 2017-10-20
  Administered 2017-10-17 – 2017-10-20 (×7): 12.5 mg via ORAL
  Filled 2017-10-16 (×7): qty 1

## 2017-10-16 MED ORDER — PROSIGHT PO TABS
1.0000 | ORAL_TABLET | Freq: Every day | ORAL | Status: DC
  Administered 2017-10-17 – 2017-10-20 (×4): 1 via ORAL
  Filled 2017-10-16 (×4): qty 1

## 2017-10-16 MED ORDER — DULOXETINE HCL 60 MG PO CPEP
60.0000 mg | ORAL_CAPSULE | Freq: Every day | ORAL | Status: DC
  Administered 2017-10-17 – 2017-10-20 (×4): 60 mg via ORAL
  Filled 2017-10-16 (×4): qty 1

## 2017-10-16 MED ORDER — SENNA 8.6 MG PO TABS: 1.0000 | ORAL_TABLET | Freq: Every day | ORAL | Status: DC | PRN

## 2017-10-16 MED ORDER — METOPROLOL TARTRATE 5 MG/5ML IV SOLN
2.5000 mg | Freq: Once | INTRAVENOUS | Status: AC
  Administered 2017-10-16: 2.5 mg via INTRAVENOUS

## 2017-10-16 MED ORDER — ONDANSETRON HCL 4 MG/2ML IJ SOLN: 4.0000 mg | Freq: Four times a day (QID) | INTRAMUSCULAR | Status: DC | PRN

## 2017-10-16 MED ORDER — FLUTICASONE PROPIONATE 50 MCG/ACT NA SUSP
2.0000 | Freq: Every day | NASAL | Status: DC
  Administered 2017-10-17: 2 via NASAL
  Filled 2017-10-16: qty 16

## 2017-10-16 MED ORDER — ACETAMINOPHEN 325 MG PO TABS: 650.0000 mg | ORAL_TABLET | Freq: Four times a day (QID) | ORAL | Status: DC | PRN

## 2017-10-16 MED ORDER — HYDROCODONE-ACETAMINOPHEN 5-325 MG PO TABS: 1.0000 | ORAL_TABLET | ORAL | Status: DC | PRN

## 2017-10-16 NOTE — ED Provider Notes (Signed)
[facility] EMERGENCY DEPARTMENT Provider Note   CSN: [id number] Arrival date & time: 10/16/17  1827     History   Chief Complaint Chief Complaint  Patient presents with  . Fall    HPI Carl Rios is a 83 y.o. male.  HPI Patient is sent from [facility] assisted living for syncopal event.  Patient reports he just got kind of dizzy and fell against the wall.  He is denying he has any area of new pain.  He reports his low back is somewhat sore but that has been an ongoing problem and he does not attribute it to his fall.  He denies his legs feel weak or numb.  The patient reports he had finished eating and he wanted to go back to his room, he reports he probably should have gotten someone to walk with him but the nursing staff are often very busy and he didn't see any one, so he started to leave.  He denies he had specifically any chest pain or shortness of breath.  He has no immediate complaints.  Patient is extremely hard of hearing.  Once he is able to understand however he answers appropriately. Past Medical History:  Diagnosis Date  . AAA (abdominal aortic aneurysm) without rupture ([place])   . CVA (cerebral infarction) 03/2014   right PCA CVA  . Diverticulosis   . Diverticulosis   . Dyslipidemia   . GERD (gastroesophageal reflux disease)   . Hearing difficulty   . Hemorrhoids   . Hiatal hernia 1960  . History of colon polyps   . History of prostatitis   . Hx of adenomatous colonic polyps   . Hyperlipidemia   . Lower GI bleed 12/2011   secondary to diverticulosis  . Macular degeneration   . Obesity   . Peripheral neuropathy   . Peripheral vascular disease ([place])   . Premature ventricular contraction   . Rhabdomyolysis 03/2014   Due to prolonged fall  . Senile osteoporosis    as per documented on reclast infusion orders  . Tachycardia-bradycardia ([place])   . Unsteady gait     Patient Active Problem List   Diagnosis Date Noted  . Orthostasis  10/16/2017  . Gait difficulty 10/16/2017  . History of CVA (cerebrovascular accident) 10/16/2017  . Pressure injury of skin 09/09/2017  . Syncope and collapse 09/08/2017  . PVC's (premature ventricular contractions) 09/23/2014  . Bradycardia 09/23/2014  . Depression 04/17/2014  . Recurrent falls 04/01/2014  . Paroxysmal SVT (supraventricular tachycardia) ([place]) 04/01/2014  . Unsteady gait 04/01/2014  . CVA (cerebral infarction) 03/31/2014  . Acute renal failure ([place]) 03/27/2014  . Dehydration 03/27/2014  . Rhabdomyolysis 03/26/2014  . AAA (abdominal aortic aneurysm) without rupture ([place]) 08/14/2013  . Abnormality of gait 01/08/2013  . Other general symptoms(780.99) 01/08/2013  . Other vitamin B12 deficiency anemia 01/08/2013  . Polyneuropathy in other diseases classified elsewhere ([place]) 01/08/2013  . Abdominal aneurysm without mention of rupture 02/01/2012  . Hypokalemia 01/25/2012  . Anemia due to acute blood loss 01/24/2012  . Benign neoplasm of colon 01/24/2012  . Hypertension 01/19/2012  . Other and unspecified hyperlipidemia 01/18/2012  . Impaired fasting glucose 01/18/2012  . Unspecified hereditary and idiopathic peripheral neuropathy 01/18/2012  . Hypertonicity of bladder 01/18/2012  . Diverticulosis 01/18/2012  . Rectal bleeding 01/18/2012  . GERD 02/09/2010  . COLONIC POLYPS, ADENOMATOUS, HX OF 02/09/2010    Past Surgical History:  Procedure Laterality Date  . ABDOMINAL AORTIC ANEURYSM REPAIR    .  ABDOMINAL AORTIC ENDOVASCULAR STENT GRAFT N/A 08/23/2013   Procedure: ABDOMINAL AORTIC ENDOVASCULAR STENT GRAFT;  Surgeon: Mal Misty, MD;  Location: [place];  Service: Vascular;  Laterality: N/A;  . CATARACT EXTRACTION Bilateral 07/2009, 09/2009  . COLONOSCOPY  01/24/2012   Procedure: COLONOSCOPY;  Surgeon: Lafayette Dragon, MD;  Location: [facility];  Service: Endoscopy;  Laterality: N/A;  . INGUINAL HERNIA REPAIR    . KNEE SURGERY     left knee in 1970's  . TESTICLE  SURGERY  1949   Undescended testicle  . TONSILLECTOMY         Home Medications    Prior to Admission medications   Medication Sig Start Date End Date Taking? Authorizing Provider  acetaminophen (TYLENOL) 325 MG tablet Take 650 mg by mouth every 6 (six) hours as needed.   Yes [provider]  aspirin EC 81 MG EC tablet Take 1 tablet (81 mg total) by mouth daily. 03/29/14  Yes Marton Redwood, MD  DULoxetine (CYMBALTA) 30 MG capsule Take 1 capsule (30 mg total) by mouth daily. Patient taking differently: Take 60 mg daily by mouth.  02/19/14  Yes Kathrynn Ducking, MD  fluticasone [facility]) 50 MCG/ACT nasal spray Place into both nostrils at bedtime.   Yes [provider]  loratadine (CLARITIN) 10 MG tablet Take 10 mg by mouth daily.   Yes [provider]  metoprolol tartrate (LOPRESSOR) 25 MG tablet Take 12.5 mg by mouth 2 (two) times daily.  05/22/14  Yes [provider]  midodrine (PROAMATINE) 2.5 MG tablet Take 1 tablet (2.5 mg total) 2 (two) times daily with a meal by mouth. 09/09/17  Yes Georgette Shell, MD  Multiple Vitamins-Minerals (MACULAR VITAMIN BENEFIT) TABS Take by mouth.   Yes [provider]  senna (SENOKOT) 8.6 MG TABS tablet Take 1 tablet (8.6 mg total) 2 (two) times daily by mouth. Patient taking differently: Take 1 tablet by mouth as needed.  09/09/17  Yes Georgette Shell, MD  tamsulosin (FLOMAX) 0.4 MG CAPS capsule Take 0.4 mg daily with supper by mouth. 08/01/17  Yes [provider]  Vitamin D, Ergocalciferol, (DRISDOL) 50000 units CAPS capsule Take 50,000 Units by mouth every 7 (seven) days.   Yes [provider]  polyethylene glycol (MIRALAX / GLYCOLAX) packet Take 17 g daily as needed by mouth for mild constipation. Patient not taking: Reported on 10/16/2017 09/09/17   Georgette Shell, MD    Family History Family History  Problem Relation Age of Onset  . Heart disease Father 3       Rheumatic  heart  . Alcohol abuse Sister     Social History Social History   Tobacco Use  . Smoking status: Former Smoker    Years: 5.00    Types: Cigarettes    Last attempt to quit: 11/02/1955    Years since quitting: 61.9  . Smokeless tobacco: Never Used  Substance Use Topics  . Alcohol use: Yes    Alcohol/week: 1.8 oz    Types: 3 Standard drinks or equivalent per week    Comment: 3 drinks per week   (QUIT X 6 MONTHS)  . Drug use: No     Allergies   Sulfamethoxazole   Review of Systems Review of Systems 10 Systems reviewed and are negative for acute change except as noted in the HPI.  Physical Exam Updated Vital Signs BP 129/90   Pulse 87   Temp (!) 96.8 F (36 C) (Axillary)   Resp  20   Ht 5\' 8"  (1.727 m)   Wt 79.4 kg (175 lb)   SpO2 92%   BMI 26.61 kg/m   Physical Exam  Constitutional: He appears well-developed and well-nourished.  Patient is alert and pleasant.  HENT:  Head: Normocephalic and atraumatic.  Right Ear: External ear normal.  Left Ear: External ear normal.  Nose: Nose normal.  Mouth/Throat: Oropharynx is clear and moist.  Patient has hearing aid in the right ear.  Eyes: EOM are normal. Pupils are equal, round, and reactive to light.  Neck:  No C-spine tenderness to palpation.  Cardiovascular:  Heart grossly regular with intermittent ectopic beats.  Pulmonary/Chest: Effort normal and breath sounds normal. He exhibits no tenderness.  No expression of pain to compression of the chest wall.  Abdominal: Soft. He exhibits no distension. There is no tenderness. There is no guarding.  Musculoskeletal:  I have had the patient sit forward in the bed.  I have examined his back.  I do not see any bruises or contusions.  I have palpated his back top to bottom without any expression of pain with pressure.  Able to flex and extend both lower extremities at the hip and knee without expression of pain.  Patient will use both upper extremities to pull himself forward  in the stretcher.  Neurological: He is alert. He exhibits normal muscle tone. Coordination normal.  Skin: Skin is warm and dry.  Psychiatric: He has a normal mood and affect.     ED Treatments / Results  Labs (all labs ordered are listed, but only abnormal results are displayed) Labs Reviewed  CBC - Abnormal; Notable for the following components:      Result Value   WBC 11.2 (*)    All other components within normal limits  BASIC METABOLIC PANEL - Abnormal; Notable for the following components:   Sodium 134 (*)    CO2 21 (*)    Calcium 8.2 (*)    All other components within normal limits  URINALYSIS, ROUTINE W REFLEX MICROSCOPIC  BASIC METABOLIC PANEL  CBC  VITAMIN B12  FOLATE RBC  CBG MONITORING, ED  I-STAT TROPONIN, ED    EKG  EKG Interpretation  Date/Time:  Sunday October 16 2017 18:42:30 EST Ventricular Rate:  84 PR Interval:    QRS Duration: 103 QT Interval:  385 QTC Calculation: 456 R Axis:   -63 Text Interpretation:  Sinus rhythm Multiple ventricular premature complexes Anterolateral infarct, old agree, no change from previous Confirmed by Charlesetta Shanks [phone]) on 10/16/2017 9:42:36 PM       Radiology No results found.  Procedures Procedures (including critical care time)  Medications Ordered in ED Medications  aspirin EC tablet 81 mg (not administered)  DULoxetine (CYMBALTA) DR capsule 60 mg (not administered)  fluticasone (FLONASE) 50 MCG/ACT nasal spray 2 spray (not administered)  loratadine (CLARITIN) tablet 10 mg (not administered)  metoprolol tartrate (LOPRESSOR) tablet 12.5 mg (not administered)  midodrine (PROAMATINE) tablet 2.5 mg (not administered)  MACULAR VITAMIN BENEFIT TABS 1 tablet (not administered)  senna (SENOKOT) tablet 8.6 mg (not administered)  tamsulosin (FLOMAX) capsule 0.4 mg (not administered)  enoxaparin (LOVENOX) injection 40 mg (not administered)  sodium chloride flush (NS) 0.9 % injection 3 mL (not administered)    acetaminophen (TYLENOL) tablet 650 mg (not administered)    Or  acetaminophen (TYLENOL) suppository 650 mg (not administered)  HYDROcodone-acetaminophen (NORCO/VICODIN) 5-325 MG per tablet 1-2 tablet (not administered)  ondansetron (ZOFRAN) tablet 4 mg (not administered)  Or  ondansetron (ZOFRAN) injection 4 mg (not administered)  metoprolol tartrate (LOPRESSOR) injection 2.5 mg (2.5 mg Intravenous Given 10/16/17 2203)     Initial Impression / Assessment and Plan / ED Course  I have reviewed the triage vital signs and the nursing notes.  Pertinent labs & imaging results that were available during my care of the patient were reviewed by me and considered in my medical decision making (see chart for details).    Consult: [facility] hospitalist for admission.  Final Clinical Impressions(s) / ED Diagnoses   Final diagnoses:  Paroxysmal atrial tachycardia ([place])  Fall, initial encounter  Dizziness  Gait instability   Patient is sent to the emergency department for episode of dizziness and weakness resulting in fall.  During the patient's assessment, there do not appear to be acute mechanical injuries.  While monitoring, patient had episodes of atrial tachycardia with multiple PVCs up to rates of 130s.  This has come and gone intermittently.  Nursing home note indicates that patient refused his metoprolol yesterday.  2.5 mg IV dose metoprolol administered.  Patient was gait tested for possible discharge.  Nursing staff reports that he was able to ambulate but twice had episodes of getting very dizzy and his knees buckling and requiring assistance not to fall to the ground.  Patient's mental status remained awake and alert without any respiratory symptoms.  Patient does not appear safe to return at this time due to high fall risk and this being potentially associated with periods of paroxysmal atrial tachycardia.  No focal neuro deficits and no evidence of traumatic injury.  ED Discharge Orders     None       Charlesetta Shanks, MD 10/17/17 0002

## 2017-10-16 NOTE — ED Notes (Addendum)
Bmp hemolyzed

## 2017-10-16 NOTE — ED Notes (Signed)
Pt ambulated down the hallway approximately 30 feet total. He walks with a walker at his current residence, so a nurse and tech assisted him down the hallway. Gait was somewhat unsteady until he returned to the room. Upon returning to his room, Pt stated that he was dizzy and began to fall. An x-ray tech assisted nurse and nurse tech in aiding Pt back to the bed. Pt noted that he "felt fine up until the end".

## 2017-10-16 NOTE — H&P (Signed)
History and Physical    Carl Rios KDT:[id number] DOB: [birth year] DOA: 10/16/2017  PCP: Marton Redwood, MD   Patient coming from: [facility]   Chief Complaint: Falls, dizziness, unsteady gait   HPI: Carl Rios is a 83 y.o. male with medical history significant for history of CVA, dementia, hypertension, paroxysmal atrial tachycardia, orthostatic hypotension, depression, and peripheral neuropathy, now presenting to the emergency department from his nursing facility for evaluation of unsteady gait with dizziness and falls.  Patient was discharged from the hospital 1 month ago after similar presentation and underwent workup with essentially normal carotid Dopplers, echocardiogram with preserved EF and no significant valvular disease, was recommended for discharge to SNF, and started on Midrin.  He continues to have unsteady gait with frequent dizzy spells while ambulating and had an acute episode overnight with near syncope, sliding to the ground without losing consciousness or his head.  He was transported to the ED for further evaluation of this.  Denies any significant pain or injury.  ED Course: Upon arrival to the ED, patient is found to be afebrile, saturating well on room air, briefly tachycardic, and with stable blood pressure.  EKG features and ectopic atrial tachycardia, chemistry panel features a mild hyponatremia, there is a slight leukocytosis on CBC, and troponin is undetectable.  Patient was given 2.5 mg IV Lopressor with resolution in atrial tachycardia and return to a sinus rhythm.  He had an unsteady gait and lightheadedness upon standing while ambulating in the ED.  He has remained hemodynamically stable and in no apparent respiratory distress.  He will be observed on the telemetry unit for ongoing evaluation and unsteady gait with frequent falls.  Review of Systems:  All other systems reviewed and apart from HPI, are negative.  Past Medical History:  Diagnosis Date  .  AAA (abdominal aortic aneurysm) without rupture ([place])   . CVA (cerebral infarction) 03/2014   right PCA CVA  . Diverticulosis   . Diverticulosis   . Dyslipidemia   . GERD (gastroesophageal reflux disease)   . Hearing difficulty   . Hemorrhoids   . Hiatal hernia 1960  . History of colon polyps   . History of prostatitis   . Hx of adenomatous colonic polyps   . Hyperlipidemia   . Lower GI bleed 12/2011   secondary to diverticulosis  . Macular degeneration   . Obesity   . Peripheral neuropathy   . Peripheral vascular disease ([place])   . Premature ventricular contraction   . Rhabdomyolysis 03/2014   Due to prolonged fall  . Senile osteoporosis    as per documented on reclast infusion orders  . Tachycardia-bradycardia ([place])   . Unsteady gait     Past Surgical History:  Procedure Laterality Date  . ABDOMINAL AORTIC ANEURYSM REPAIR    . ABDOMINAL AORTIC ENDOVASCULAR STENT GRAFT N/A 08/23/2013   Procedure: ABDOMINAL AORTIC ENDOVASCULAR STENT GRAFT;  Surgeon: Mal Misty, MD;  Location: [facility];  Service: Vascular;  Laterality: N/A;  . CATARACT EXTRACTION Bilateral 07/2009, 09/2009  . COLONOSCOPY  01/24/2012   Procedure: COLONOSCOPY;  Surgeon: Lafayette Dragon, MD;  Location: [facility];  Service: Endoscopy;  Laterality: N/A;  . INGUINAL HERNIA REPAIR    . KNEE SURGERY     left knee in 1970's  . TESTICLE SURGERY  1949   Undescended testicle  . TONSILLECTOMY       reports that he quit smoking about 61 years ago. His smoking use included cigarettes. He quit after  5.00 years of use. he has never used smokeless tobacco. He reports that he drinks about 1.8 oz of alcohol per week. He reports that he does not use drugs.  Allergies  Allergen Reactions  . Sulfamethoxazole Other (See Comments)    UNKNOWN    Family History  Problem Relation Age of Onset  . Heart disease Father 29       Rheumatic heart  . Alcohol abuse Sister      Prior to Admission medications   Medication Sig  Start Date End Date Taking? Authorizing Provider  acetaminophen (TYLENOL) 325 MG tablet Take 650 mg by mouth every 6 (six) hours as needed.   Yes [provider]  aspirin EC 81 MG EC tablet Take 1 tablet (81 mg total) by mouth daily. 03/29/14  Yes Marton Redwood, MD  DULoxetine (CYMBALTA) 30 MG capsule Take 1 capsule (30 mg total) by mouth daily. Patient taking differently: Take 60 mg daily by mouth.  02/19/14  Yes Kathrynn Ducking, MD  fluticasone [facility]) 50 MCG/ACT nasal spray Place into both nostrils at bedtime.   Yes [provider]  loratadine (CLARITIN) 10 MG tablet Take 10 mg by mouth daily.   Yes [provider]  metoprolol tartrate (LOPRESSOR) 25 MG tablet Take 12.5 mg by mouth 2 (two) times daily.  05/22/14  Yes [provider]  midodrine (PROAMATINE) 2.5 MG tablet Take 1 tablet (2.5 mg total) 2 (two) times daily with a meal by mouth. 09/09/17  Yes Georgette Shell, MD  Multiple Vitamins-Minerals (MACULAR VITAMIN BENEFIT) TABS Take by mouth.   Yes [provider]  senna (SENOKOT) 8.6 MG TABS tablet Take 1 tablet (8.6 mg total) 2 (two) times daily by mouth. Patient taking differently: Take 1 tablet by mouth as needed.  09/09/17  Yes Georgette Shell, MD  tamsulosin (FLOMAX) 0.4 MG CAPS capsule Take 0.4 mg daily with supper by mouth. 08/01/17  Yes [provider]  Vitamin D, Ergocalciferol, (DRISDOL) 50000 units CAPS capsule Take 50,000 Units by mouth every 7 (seven) days.   Yes [provider]  polyethylene glycol (MIRALAX / GLYCOLAX) packet Take 17 g daily as needed by mouth for mild constipation. Patient not taking: Reported on 10/16/2017 09/09/17   Georgette Shell, MD    Physical Exam: Vitals:   10/16/17 2202 10/16/17 2210 10/16/17 2215 10/16/17 2245  BP:   118/84 102/89  Pulse:    85  Resp:    (!) 25  Temp:      TempSrc:      SpO2: 93% 92%  93%  Weight:      Height:          Constitutional: NAD, calm,  comfortable Eyes: PERTLA, lids and conjunctivae normal ENMT: Mucous membranes are moist. Posterior pharynx clear of any exudate or lesions.   Neck: normal, supple, no masses, no thyromegaly Respiratory: Breath sounds diminished bilaterally, no wheezing, no crackles. Normal respiratory effort.    Cardiovascular: S1 & S2 heard, regular rate and rhythm. No significant JVD. Abdomen: No distension, no tenderness, no masses palpated. Bowel sounds normal.  Musculoskeletal: no clubbing / cyanosis. No joint deformity upper and lower extremities.   Skin: no significant rashes, lesions, ulcers. Warm, dry, well-perfused. Neurologic: No facial asymmetry, PERRL, EOMI, gross hearing deficit. Sensation intact. Strength 5/5 in all 4 limbs.  Psychiatric: Alert and oriented to person and place only. Calm, cooperative.     Labs on Admission: I have personally reviewed following labs and imaging studies  CBC: Recent Labs  Lab 10/16/17 1937  WBC 11.2*  HGB 15.5  HCT 46.3  MCV 90.3  PLT 419   Basic Metabolic Panel: Recent Labs  Lab 10/16/17 2052  NA 134*  K 3.6  CL 102  CO2 21*  GLUCOSE 82  BUN 15  CREATININE 0.94  CALCIUM 8.2*   GFR: Estimated Creatinine Clearance: 51.5 mL/min (by C-G formula based on SCr of 0.94 mg/dL). Liver Function Tests: No results for input(s): AST, ALT, ALKPHOS, BILITOT, PROT, ALBUMIN in the last 168 hours. No results for input(s): LIPASE, AMYLASE in the last 168 hours. No results for input(s): AMMONIA in the last 168 hours. Coagulation Profile: No results for input(s): INR, PROTIME in the last 168 hours. Cardiac Enzymes: No results for input(s): CKTOTAL, CKMB, CKMBINDEX, TROPONINI in the last 168 hours. BNP (last 3 results) No results for input(s): PROBNP in the last 8760 hours. HbA1C: No results for input(s): HGBA1C in the last 72 hours. CBG: Recent Labs  Lab 10/16/17 1944  GLUCAP 74   Lipid Profile: No results for input(s): CHOL, HDL, LDLCALC, TRIG,  CHOLHDL, LDLDIRECT in the last 72 hours. Thyroid Function Tests: No results for input(s): TSH, T4TOTAL, FREET4, T3FREE, THYROIDAB in the last 72 hours. Anemia Panel: No results for input(s): VITAMINB12, FOLATE, FERRITIN, TIBC, IRON, RETICCTPCT in the last 72 hours. Urine analysis:    Component Value Date/Time   COLORURINE YELLOW 09/08/2017 [place] 09/08/2017 1515   LABSPEC 1.019 09/08/2017 1515   PHURINE 6.0 09/08/2017 1515   GLUCOSEU NEGATIVE 09/08/2017 1515   HGBUR NEGATIVE 09/08/2017 1515   BILIRUBINUR NEGATIVE 09/08/2017 1515   KETONESUR NEGATIVE 09/08/2017 1515   PROTEINUR NEGATIVE 09/08/2017 1515   UROBILINOGEN 1.0 07/26/2015 1745   NITRITE NEGATIVE 09/08/2017 1515   LEUKOCYTESUR NEGATIVE 09/08/2017 1515   Sepsis Labs: @LABRCNTIP (procalcitonin:4,lacticidven:4) )No results found for this or any previous visit (from the past 240 hour(s)).   Radiological Exams on Admission: No results found.  EKG: Independently reviewed. Ectopic atrial tachycardia.   Assessment/Plan  1. Unsteady gait; frequent falls  - Pt presents with unsteady gait and lightheadedness on standing with frequent falls  - Admitted for same 1 month ago, had echo with preserved EF and no significant valvular disease, and normal carotid dopplers; he was started on midodrine and recommended for SNF but could not afford  - Likely secondary to orthostasis, autonomic dysfunction; difficult to exclude CVA   - Continue cardiac monitoring, continue midodrine, start ted hoes, check MRI brain    2. Orthostasis  - Pt has known orthostasis and was recently started on midodrine  - He is lightheaded on standing in ED, reports near-syncope prior to arrival   - Start ted hose, ensure adequate hydration, counseled regarding fall-risk   3. Depression  - Stable, continue Cymbalta    4. Paroxysmal atrial tachycardia  - Was in ectopic atrial tachycardia briefly in ED, resolved with IVP Lopressor  - Nursing  home reports that he refused his metoprolol yesterday  - Resume metoprolol, continue cardiac monitoring    DVT prophylaxis: Lovenox  Code Status: Full  Family Communication: Discussed with patient Disposition Plan: Observe on telemetry Consults called: None Admission status: Observation    Vianne Bulls, MD [facility] Pager [phone]  If 7PM-7AM, please contact night-coverage [email or web address] Password [email or web address]  10/16/2017, 11:21 PM

## 2017-10-16 NOTE — ED Triage Notes (Signed)
Pt arrives to ED from [facility] via EMS with complaints of dizziness and witnessed fall to floor. Pt reportedly slid down the wall to the floor, did not hit head, no neck pain. EMS states pt's dizziness has gone away, hx of dementia but is alert to self, time, and situation. pt is very hard of hearing despite hearing aids. Pt complaining of 3/10 lower back pain. Pt placed in position of comfort with bed locked and lowered, call bell in reach.

## 2017-10-16 NOTE — ED Notes (Signed)
CBG at 19:45 was 74.

## 2017-10-17 ENCOUNTER — Other Ambulatory Visit: Payer: Self-pay

## 2017-10-17 ENCOUNTER — Observation Stay (HOSPITAL_COMMUNITY): Payer: Medicare Other

## 2017-10-17 DIAGNOSIS — F05 Delirium due to known physiological condition: Secondary | ICD-10-CM | POA: Diagnosis present

## 2017-10-17 DIAGNOSIS — F329 Major depressive disorder, single episode, unspecified: Secondary | ICD-10-CM | POA: Diagnosis present

## 2017-10-17 DIAGNOSIS — I739 Peripheral vascular disease, unspecified: Secondary | ICD-10-CM | POA: Diagnosis present

## 2017-10-17 DIAGNOSIS — R1312 Dysphagia, oropharyngeal phase: Secondary | ICD-10-CM | POA: Diagnosis not present

## 2017-10-17 DIAGNOSIS — R278 Other lack of coordination: Secondary | ICD-10-CM | POA: Diagnosis not present

## 2017-10-17 DIAGNOSIS — G912 (Idiopathic) normal pressure hydrocephalus: Secondary | ICD-10-CM | POA: Diagnosis present

## 2017-10-17 DIAGNOSIS — H3530 Unspecified macular degeneration: Secondary | ICD-10-CM | POA: Diagnosis present

## 2017-10-17 DIAGNOSIS — Z9181 History of falling: Secondary | ICD-10-CM | POA: Diagnosis not present

## 2017-10-17 DIAGNOSIS — S0990XA Unspecified injury of head, initial encounter: Secondary | ICD-10-CM | POA: Diagnosis not present

## 2017-10-17 DIAGNOSIS — I4719 Other supraventricular tachycardia: Secondary | ICD-10-CM

## 2017-10-17 DIAGNOSIS — I951 Orthostatic hypotension: Secondary | ICD-10-CM | POA: Diagnosis not present

## 2017-10-17 DIAGNOSIS — R42 Dizziness and giddiness: Secondary | ICD-10-CM | POA: Diagnosis not present

## 2017-10-17 DIAGNOSIS — R296 Repeated falls: Secondary | ICD-10-CM | POA: Diagnosis present

## 2017-10-17 DIAGNOSIS — R2681 Unsteadiness on feet: Secondary | ICD-10-CM | POA: Diagnosis not present

## 2017-10-17 DIAGNOSIS — R41841 Cognitive communication deficit: Secondary | ICD-10-CM | POA: Diagnosis not present

## 2017-10-17 DIAGNOSIS — R4182 Altered mental status, unspecified: Secondary | ICD-10-CM | POA: Diagnosis not present

## 2017-10-17 DIAGNOSIS — M6281 Muscle weakness (generalized): Secondary | ICD-10-CM | POA: Diagnosis not present

## 2017-10-17 DIAGNOSIS — G8911 Acute pain due to trauma: Secondary | ICD-10-CM | POA: Diagnosis not present

## 2017-10-17 DIAGNOSIS — W19XXXA Unspecified fall, initial encounter: Secondary | ICD-10-CM | POA: Diagnosis not present

## 2017-10-17 DIAGNOSIS — Z8249 Family history of ischemic heart disease and other diseases of the circulatory system: Secondary | ICD-10-CM | POA: Diagnosis not present

## 2017-10-17 DIAGNOSIS — E871 Hypo-osmolality and hyponatremia: Secondary | ICD-10-CM | POA: Diagnosis present

## 2017-10-17 DIAGNOSIS — R269 Unspecified abnormalities of gait and mobility: Secondary | ICD-10-CM | POA: Diagnosis not present

## 2017-10-17 DIAGNOSIS — Z7982 Long term (current) use of aspirin: Secondary | ICD-10-CM | POA: Diagnosis not present

## 2017-10-17 DIAGNOSIS — I10 Essential (primary) hypertension: Secondary | ICD-10-CM | POA: Diagnosis present

## 2017-10-17 DIAGNOSIS — I471 Supraventricular tachycardia: Secondary | ICD-10-CM

## 2017-10-17 DIAGNOSIS — F0390 Unspecified dementia without behavioral disturbance: Secondary | ICD-10-CM | POA: Diagnosis present

## 2017-10-17 DIAGNOSIS — H9190 Unspecified hearing loss, unspecified ear: Secondary | ICD-10-CM | POA: Diagnosis present

## 2017-10-17 DIAGNOSIS — Z8673 Personal history of transient ischemic attack (TIA), and cerebral infarction without residual deficits: Secondary | ICD-10-CM | POA: Diagnosis not present

## 2017-10-17 DIAGNOSIS — K219 Gastro-esophageal reflux disease without esophagitis: Secondary | ICD-10-CM | POA: Diagnosis not present

## 2017-10-17 LAB — CBC
HEMATOCRIT: 46.6 % (ref 39.0–52.0)
Hemoglobin: 15.1 g/dL (ref 13.0–17.0)
MCH: 29.4 pg (ref 26.0–34.0)
MCHC: 32.4 g/dL (ref 30.0–36.0)
MCV: 90.8 fL (ref 78.0–100.0)
PLATELETS: 349 10*3/uL (ref 150–400)
RBC: 5.13 MIL/uL (ref 4.22–5.81)
RDW: 15.1 % (ref 11.5–15.5)
WBC: 10.2 10*3/uL (ref 4.0–10.5)

## 2017-10-17 LAB — BASIC METABOLIC PANEL
Anion gap: 10 (ref 5–15)
BUN: 13 mg/dL (ref 6–20)
CHLORIDE: 106 mmol/L (ref 101–111)
CO2: 21 mmol/L — AB (ref 22–32)
Calcium: 8.2 mg/dL — ABNORMAL LOW (ref 8.9–10.3)
Creatinine, Ser: 0.94 mg/dL (ref 0.61–1.24)
GFR calc Af Amer: >60 mL/min (ref >60)
GFR calc non Af Amer: >60 mL/min (ref >60)
GLUCOSE: 101 mg/dL — AB (ref 65–99)
POTASSIUM: 3.5 mmol/L (ref 3.5–5.1)
Sodium: 137 mmol/L (ref 135–145)

## 2017-10-17 LAB — VITAMIN B12: Vitamin B-12: 378 pg/mL (ref 180–914)

## 2017-10-17 MED ORDER — METOPROLOL TARTRATE 5 MG/5ML IV SOLN
5.0000 mg | Freq: Once | INTRAVENOUS | Status: AC
  Administered 2017-10-17: 5 mg via INTRAVENOUS
  Filled 2017-10-17: qty 5

## 2017-10-17 NOTE — Progress Notes (Signed)
PT Cancellation Note  Patient Details Name: Carl Rios MRN: [id number] DOB: [birth year]   Cancelled Treatment:    Reason Eval/Treat Not Completed: Patient at procedure or test/unavailable;Medical issues which prohibited therapy pt off floor at MRI and with new A-fib, awaiting Lovenox injection. Will follow up when pt appropriate for PT services per protocol.   Marguarite Arbour A Krislynn Gronau 10/17/2017, 10:43 AM Wray Kearns, PT, DPT [phone]

## 2017-10-17 NOTE — Progress Notes (Addendum)
Pt HR in the 130's and sustaining; Dr. Myna Hidalgo notified, awaiting callback. Pt in bed sleeping on right side.   Orders received for EKG and 5mg  Metoprolol; Pt EKG completed and in chart, IV metoprolol administered as per order. Pt HR went from 140's-150's to 80's-90's at this time.

## 2017-10-17 NOTE — Progress Notes (Signed)
PROGRESS NOTE    Carl Rios  JSE:[id number] DOB: 20-Oct-1926 DOA: 10/16/2017 PCP: Marton Redwood, MD   Outpatient Specialists:     Brief Narrative:  Carl Rios is a 83 y.o. male with medical history significant for history of CVA, dementia, hypertension, paroxysmal atrial tachycardia, orthostatic hypotension, depression, and peripheral neuropathy, now presenting to the emergency department from his nursing facility for evaluation of unsteady gait with dizziness and falls.  Patient was discharged from the hospital 1 month ago after similar presentation and underwent workup with essentially normal carotid Dopplers, echocardiogram with preserved EF and no significant valvular disease, was recommended for discharge to SNF, and started on Midrin.  He continues to have unsteady gait with frequent dizzy spells while ambulating and had an acute episode overnight with near syncope, sliding to the ground without losing consciousness or his head.  He was transported to the ED for further evaluation of this.  Denies any significant pain or injury.      Assessment & Plan:   Principal Problem:   Unsteady gait Active Problems:   Recurrent falls   Depression   Orthostasis   Gait difficulty   History of CVA (cerebrovascular accident)   Paroxysmal atrial tachycardia (HCC)   Unsteady gait; frequent falls  - Pt presents with unsteady gait and lightheadedness on standing with frequent falls  - Admitted for same 1 month ago, had echo with preserved EF and no significant valvular disease, and normal carotid dopplers; he was started on midodrine and recommended for SNF but could not afford  - Likely secondary to orthostasis, autonomic dysfunction; difficult to exclude CVA   - Continue cardiac monitoring, continue midodrine, start ted hoes -MRI shows: Progressive atrophy, hydrocephalus ex vacuo, and small vessel disease since 2015.  Orthostasis  - Pt has known orthostasis and was recently started  on midodrine  - He is lightheaded on standing in ED, reports near-syncope prior to arrival   - Start ted hose, ensure adequate hydration, counseled regarding fall-risk  -? MRI results contributing  Depression  - Stable, continue Cymbalta    Paroxysmal atrial tachycardia  - Was in ectopic atrial tachycardia briefly in ED, resolved with IVP Lopressor  - Nursing home reports that he refused his metoprolol yesterday  - Resume metoprolol, continue cardiac monitoring -does not appear to be a fib  delirium on top of dementia -requiring sitter   DVT prophylaxis:  SCD's  Code Status: Full Code   Family Communication: Son at bedside  Disposition Plan:     Consultants:        Subjective: Very hard of hearing and confused  Objective: Vitals:   10/17/17 0138 10/17/17 0422 10/17/17 0952 10/17/17 1340  BP: 131/85 118/70 (!) 144/85 (!) 143/81  Pulse: 86 77 83 70  Resp: 20 20 20 20   Temp: 97.6 F (36.4 C) 97.7 F (36.5 C) 97.6 F (36.4 C)   TempSrc: Oral Oral Oral   SpO2: 95% 95% 92% 93%  Weight:      Height:        Intake/Output Summary (Last 24 hours) at 10/17/2017 1650 Last data filed at 10/17/2017 1300 Gross per 24 hour  Intake 480 ml  Output -  Net 480 ml   Filed Weights   10/16/17 1833  Weight: 79.4 kg (175 lb)    Examination:  General exam: trying to get out of bed-- not re-directable Respiratory system: clear Cardiovascular system: rrr Gastrointestinal system: +BS, soft Central nervous system: Alert but oriented to place/time only  person Extremities: moves all 4 ext Psychiatry: impulsive, not able to be re-directed    Data Reviewed: I have personally reviewed following labs and imaging studies  CBC: Recent Labs  Lab 10/16/17 1937 10/17/17 0429  WBC 11.2* 10.2  HGB 15.5 15.1  HCT 46.3 46.6  MCV 90.3 90.8  PLT 342 762   Basic Metabolic Panel: Recent Labs  Lab 10/16/17 2052 10/17/17 0429  NA 134* 137  K 3.6 3.5  CL 102 106    CO2 21* 21*  GLUCOSE 82 101*  BUN 15 13  CREATININE 0.94 0.94  CALCIUM 8.2* 8.2*   GFR: Estimated Creatinine Clearance: 51.5 mL/min (by C-G formula based on SCr of 0.94 mg/dL). Liver Function Tests: No results for input(s): AST, ALT, ALKPHOS, BILITOT, PROT, ALBUMIN in the last 168 hours. No results for input(s): LIPASE, AMYLASE in the last 168 hours. No results for input(s): AMMONIA in the last 168 hours. Coagulation Profile: No results for input(s): INR, PROTIME in the last 168 hours. Cardiac Enzymes: No results for input(s): CKTOTAL, CKMB, CKMBINDEX, TROPONINI in the last 168 hours. BNP (last 3 results) No results for input(s): PROBNP in the last 8760 hours. HbA1C: No results for input(s): HGBA1C in the last 72 hours. CBG: Recent Labs  Lab 10/16/17 1944  GLUCAP 74   Lipid Profile: No results for input(s): CHOL, HDL, LDLCALC, TRIG, CHOLHDL, LDLDIRECT in the last 72 hours. Thyroid Function Tests: No results for input(s): TSH, T4TOTAL, FREET4, T3FREE, THYROIDAB in the last 72 hours. Anemia Panel: Recent Labs    10/17/17 0429  VITAMINB12 378   Urine analysis:    Component Value Date/Time   COLORURINE YELLOW 09/08/2017 [place] 09/08/2017 1515   LABSPEC 1.019 09/08/2017 1515   PHURINE 6.0 09/08/2017 1515   GLUCOSEU NEGATIVE 09/08/2017 1515   HGBUR NEGATIVE 09/08/2017 [place] 09/08/2017 1515   KETONESUR NEGATIVE 09/08/2017 1515   PROTEINUR NEGATIVE 09/08/2017 1515   UROBILINOGEN 1.0 07/26/2015 1745   NITRITE NEGATIVE 09/08/2017 1515   LEUKOCYTESUR NEGATIVE 09/08/2017 1515     )No results found for this or any previous visit (from the past 240 hour(s)).    Anti-infectives (From admission, onward)   None       Radiology Studies: Mr Brain Wo Contrast  Result Date: 10/17/2017 CLINICAL DATA:  Unsteady gait. Dizziness and falls. History of orthostasis, autonomic dysfunction. EXAM: MRI HEAD WITHOUT CONTRAST TECHNIQUE:  Multiplanar, multiecho pulse sequences of the brain and surrounding structures were obtained without intravenous contrast. COMPARISON:  CT head 07/26/2015.  MR head 03/29/2014. FINDINGS: Brain: No evidence for acute infarction, hemorrhage, mass lesion, or extra-axial fluid. Generalized atrophy. Extensive chronic microvascular ischemic change. Widespread areas of deep white matter, deep nuclei, and brainstem infarction. Chronic RIGHT occipital infarction was acute in 2015. Vascular: Flow voids are maintained throughout the carotid, basilar, and vertebral arteries. Skull and upper cervical spine: Normal marrow signal. Sinuses/Orbits: No layering fluid.  BILATERAL cataract extraction. Other: Compared with priors, progression small vessel disease and ischemic change. IMPRESSION: Progressive atrophy, hydrocephalus ex vacuo, and small vessel disease since 2015. No acute intracranial abnormality on today's exam. Electronically Signed   By: Staci Righter M.D.   On: 10/17/2017 12:01        Scheduled Meds: . aspirin EC  81 mg Oral Daily  . DULoxetine  60 mg Oral Daily  . enoxaparin (LOVENOX) injection  40 mg Subcutaneous Q24H  . fluticasone  2 spray Each Nare QHS  . loratadine  10 mg Oral Daily  . metoprolol tartrate  12.5 mg Oral BID  . midodrine  2.5 mg Oral BID WC  . multivitamin  1 tablet Oral Daily  . sodium chloride flush  3 mL Intravenous Q12H   Continuous Infusions:   LOS: 0 days    Time spent: 35 min    Geradine Girt, DO [facility] Pager [phone]  If 7PM-7AM, please contact night-coverage [email or web address] Password [email or web address] 10/17/2017, 4:50 PM

## 2017-10-17 NOTE — Evaluation (Signed)
Physical Therapy Evaluation Patient Details Name: Carl Rios MRN: [id number] DOB: Jan 01, 1926 Today's Date: 10/17/2017   History of Present Illness  83 y.o. male admitted for feeling dizzy and falling. Pt found to be in paroxsymal atrial tachycardia upon arrival at hospital. Pertinent PMH includes CVA (2015), rhabdomyolysis, recurrent falls, dizziness, HTN, PVD, AAA, peripheral neuropathy, macular degeneration, HOH, orthostatic hypotension, and dementia..  Clinical Impression  Pt presents with decreased strength, decreased balance, impaired mobility, and impaired cognition secondary to above. Pt is A&Ox1 (person) and very HOH. Pt following one-step commands inconsistently and is confused throughout session. Pt attempts to put on gown as if it were his pants. Pt tolerates 20-ft of ambulation with RW and min assist. Pt moved to ALF one-week ago and lives there alone. Thus, discharge recommendation for SNF, as he is needing 24/7 supervision/assistance in his current state of cognition and mobility status. PT will follow acutely in [place] to promote safe mobility within hospital setting.     Follow Up Recommendations SNF;Supervision/Assistance - 24 hour    Equipment Recommendations  None recommended by PT    Recommendations for Other Services OT consult     Precautions / Restrictions Precautions Precautions: Fall Restrictions Weight Bearing Restrictions: No      Mobility  Bed Mobility Overal bed mobility: Needs Assistance Bed Mobility: Supine to Sit     Supine to sit: Mod assist;HOB elevated     General bed mobility comments: x1, pt requiring therapist arm assist to aid in pulling himself up.   Transfers Overall transfer level: Needs assistance Equipment used: Rolling walker (2 wheeled) Transfers: Sit to/from Stand Sit to Stand: Min assist         General transfer comment: x1 from EOB. VCs for hand placement.   Ambulation/Gait Ambulation/Gait assistance: Min  assist Ambulation Distance (Feet): 20 Feet Assistive device: Rolling walker (2 wheeled) Gait Pattern/deviations: Wide base of support;Trunk flexed;Decreased stride length;Step-through pattern Gait velocity: decreased   General Gait Details: Pt with generally unsteady gait.  Stairs            Wheelchair Mobility    Modified Rankin (Stroke Patients Only)       Balance Overall balance assessment: Needs assistance Sitting-balance support: No upper extremity supported;Feet supported Sitting balance-Leahy Scale: Fair Sitting balance - Comments: Pt able to sit EOB without external support.      Standing balance-Leahy Scale: Poor Standing balance comment: Pt utilizes RW and/or therapist assist for external support.                             Pertinent Vitals/Pain Pain Assessment: No/denies pain    Home Living Family/patient expects to be discharged to:: Assisted living               Home Equipment: Walker - 2 wheels;Walker - 4 wheels;Cane - single point;Toilet riser;Tub bench Additional Comments: Son can provide DME.    Prior Function Level of Independence: Needs assistance         Comments: Pt recently moved to ALF (1 week ago). Pt sees PT and OT at facility and has been using rollator for mobility.      Hand Dominance   Dominant Hand: Right    Extremity/Trunk Assessment   Upper Extremity Assessment Upper Extremity Assessment: Defer to OT evaluation    Lower Extremity Assessment Lower Extremity Assessment: Generalized weakness       Communication   Communication: HOH  Cognition Arousal/Alertness: Awake/alert Behavior  During Therapy: Restless Overall Cognitive Status: Impaired/Different from baseline Area of Impairment: Orientation;Awareness;Attention;Problem solving;Memory;Following commands;Safety/judgement                 Orientation Level: Disoriented to;Place;Time;Situation Current Attention Level: Focused Memory:  Decreased recall of precautions;Decreased short-term memory Following Commands: Follows one step commands inconsistently Safety/Judgement: Decreased awareness of safety;Decreased awareness of deficits Awareness: Intellectual Problem Solving: Slow processing;Difficulty sequencing;Requires verbal cues General Comments: Pt is A&O x1 (person). Pt continually trying to get out of bed, despite being told to remain in bed until assistance arrives. When asked where he was going, pt informs that he is going home. Pt trying to put on gown as if it were pants. Pt continually asking about where son is. Pt very easily distracted. Pt's attention only lasts for a few moments before needing to be redirected. Pt tries to get rid of telemetry. Pt agrees to go for a walk, however once at door, pt asks what time it is. When he finds out the time, pt replies "oh well then i can't walk anymore. i have to have my water and go lay down."       General Comments General comments (skin integrity, edema, etc.): Pt's son arrives towards end of PT session and able to give home information and history. Son informs that he had just seen pt yesterday and he seeemed to be diong well, then he recieved a call later that night that he was admitted to hospital. BP seated EOB: 103/80, BP standing: 106/94. No c/o dizziness.     Exercises     Assessment/Plan    PT Assessment Patient needs continued PT services  PT Problem List Decreased strength;Decreased mobility;Decreased safety awareness;Decreased cognition;Decreased knowledge of precautions;Decreased balance;Cardiopulmonary status limiting activity       PT Treatment Interventions DME instruction;Therapeutic activities;Cognitive remediation;Gait training;Therapeutic exercise;Patient/family education;Stair training;Balance training;Functional mobility training;Neuromuscular re-education    PT Goals (Current goals can be found in the Care Plan section)  Acute Rehab PT  Goals Patient Stated Goal: to go home PT Goal Formulation: With patient/family Time For Goal Achievement: 10/31/17 Potential to Achieve Goals: Good    Frequency Min 2X/week   Barriers to discharge Decreased caregiver support Pt lives alone at ALF.     Co-evaluation               AM-PAC PT "6 Clicks" Daily Activity  Outcome Measure Difficulty turning over in bed (including adjusting bedclothes, sheets and blankets)?: A Little Difficulty moving from lying on back to sitting on the side of the bed? : Unable Difficulty sitting down on and standing up from a chair with arms (e.g., wheelchair, bedside commode, etc,.)?: Unable Help needed moving to and from a bed to chair (including a wheelchair)?: A Little Help needed walking in hospital room?: A Little Help needed climbing 3-5 steps with a railing? : A Lot 6 Click Score: 13    End of Session Equipment Utilized During Treatment: Gait belt Activity Tolerance: Patient limited by fatigue Patient left: in chair;with chair alarm set;with call bell/phone within reach;with family/visitor present Nurse Communication: Mobility status PT Visit Diagnosis: Unsteadiness on feet (R26.81);Difficulty in walking, not elsewhere classified (R26.2);History of falling (Z91.81)    Time: 6767-2094 PT Time Calculation (min) (ACUTE ONLY): 27 min   Charges:   PT Evaluation $PT Eval Moderate Complexity: 1 Mod PT Treatments $Therapeutic Activity: 8-22 mins   PT G Codes:   PT G-Codes **NOT FOR INPATIENT CLASS** Functional Assessment Tool Used: Clinical judgement Functional Limitation: Mobility:  Walking and moving around Mobility: Walking and Moving Around Current Status [phone]): At least 20 percent but less than 40 percent impaired, limited or restricted Mobility: Walking and Moving Around Goal Status [phone]): At least 1 percent but less than 20 percent impaired, limited or restricted    Judee Clara, SPT  Judee Clara 10/17/2017, 4:01 PM

## 2017-10-17 NOTE — Plan of Care (Signed)
Pt admitted to room 3w18, A&Ox4, very HOH. Admission completed, pt currently resting in bed with eyes closed. HR bouncing around, anywhere from 40's to 140 briefly. Per telemetry, Pt appears to be in A-fib at this time.

## 2017-10-18 DIAGNOSIS — I951 Orthostatic hypotension: Principal | ICD-10-CM

## 2017-10-18 LAB — GLUCOSE, CAPILLARY: GLUCOSE-CAPILLARY: 89 mg/dL (ref 65–99)

## 2017-10-18 LAB — FOLATE RBC
FOLATE, HEMOLYSATE: 546.4 ng/mL
Folate, RBC: 1206 ng/mL (ref >498)
Hematocrit: 45.3 % (ref 37.5–51.0)

## 2017-10-18 NOTE — NC FL2 (Signed)
[place] LEVEL OF CARE SCREENING TOOL     IDENTIFICATION  Patient Name: Carl Rios Birthdate: [birth year] Sex: male Admission Date (Current Location): 10/16/2017  [facility] [place] and [place] Number:  Herbalist and Address:  [facility] [place], [place] [address] [place], [place] [address]      Provider Number: [id number]  Attending Physician Name and Address:  Geradine Girt, DO  Relative Name and Phone Number:       Current Level of Care: Hospital Recommended Level of Care: [place] Prior Approval Number:    Date Approved/Denied:   PASRR Number: [id number]  Discharge Plan: SNF    Current Diagnoses: Patient Active Problem List   Diagnosis Date Noted  . Paroxysmal atrial tachycardia ([place])   . Orthostasis 10/16/2017  . Gait difficulty 10/16/2017  . History of CVA (cerebrovascular accident) 10/16/2017  . Pressure injury of skin 09/09/2017  . Syncope and collapse 09/08/2017  . PVC's (premature ventricular contractions) 09/23/2014  . Bradycardia 09/23/2014  . Depression 04/17/2014  . Recurrent falls 04/01/2014  . Paroxysmal SVT (supraventricular tachycardia) ([place]) 04/01/2014  . Unsteady gait 04/01/2014  . CVA (cerebral infarction) 03/31/2014  . Acute renal failure ([place]) 03/27/2014  . Dehydration 03/27/2014  . Rhabdomyolysis 03/26/2014  . AAA (abdominal aortic aneurysm) without rupture ([place]) 08/14/2013  . Abnormality of gait 01/08/2013  . Other general symptoms(780.99) 01/08/2013  . Other vitamin B12 deficiency anemia 01/08/2013  . Polyneuropathy in other diseases classified elsewhere ([place]) 01/08/2013  . Abdominal aneurysm without mention of rupture 02/01/2012  . Hypokalemia 01/25/2012  . Anemia due to acute blood loss 01/24/2012  . Benign neoplasm of colon 01/24/2012  . Hypertension 01/19/2012  . Other and unspecified hyperlipidemia 01/18/2012  . Impaired fasting glucose 01/18/2012  . Unspecified hereditary  and idiopathic peripheral neuropathy 01/18/2012  . Hypertonicity of bladder 01/18/2012  . Diverticulosis 01/18/2012  . Rectal bleeding 01/18/2012  . GERD 02/09/2010  . COLONIC POLYPS, ADENOMATOUS, HX OF 02/09/2010    Orientation RESPIRATION BLADDER Height & Weight     Self, Time  Normal Incontinent Weight: 175 lb (79.4 kg) Height:  5\' 8"  (172.7 cm)  BEHAVIORAL SYMPTOMS/MOOD NEUROLOGICAL BOWEL NUTRITION STATUS      Continent    AMBULATORY STATUS COMMUNICATION OF NEEDS Skin   Extensive Assist Verbally Other (Comment)(MASD, groin and buttocks, barrier cream)                       Personal Care Assistance Level of Assistance  Bathing, Feeding, Dressing Bathing Assistance: Maximum assistance Feeding assistance: Limited assistance Dressing Assistance: Maximum assistance     Functional Limitations Info  Sight, Hearing, Speech Sight Info: Adequate Hearing Info: Impaired Speech Info: Adequate    SPECIAL CARE FACTORS FREQUENCY  PT (By licensed PT), OT (By licensed OT)     PT Frequency: 5x/wk OT Frequency: 5x/wk            Contractures Contractures Info: Not present    Additional Factors Info  Code Status, Allergies, Psychotropic Code Status Info: Full Allergies Info: Sulfamethoxazole Psychotropic Info: Cymbalta 60mg  daily         Current Medications (10/18/2017):  This is the current hospital active medication list Current Facility-Administered Medications  Medication Dose Route Frequency Provider Last Rate Last Dose  . acetaminophen (TYLENOL) tablet 650 mg  650 mg Oral Q6H PRN Opyd, Ilene Qua, MD       Or  . acetaminophen (TYLENOL) suppository 650 mg  650 mg Rectal Q6H PRN Opyd, Ilene Qua, MD      . aspirin EC tablet 81 mg  81 mg Oral Daily Opyd, Ilene Qua, MD   81 mg at 10/18/17 1011  . DULoxetine (CYMBALTA) DR capsule 60 mg  60 mg Oral Daily Opyd, Ilene Qua, MD   60 mg at 10/18/17 1012  . enoxaparin (LOVENOX) injection 40 mg  40 mg Subcutaneous Q24H Opyd,  Ilene Qua, MD   40 mg at 10/17/17 1228  . fluticasone (FLONASE) 50 MCG/ACT nasal spray 2 spray  2 spray Each Nare QHS Opyd, Ilene Qua, MD   2 spray at 10/17/17 2202  . HYDROcodone-acetaminophen (NORCO/VICODIN) 5-325 MG per tablet 1-2 tablet  1-2 tablet Oral Q4H PRN Opyd, Ilene Qua, MD      . loratadine (CLARITIN) tablet 10 mg  10 mg Oral Daily Opyd, Ilene Qua, MD   10 mg at 10/18/17 1012  . metoprolol tartrate (LOPRESSOR) tablet 12.5 mg  12.5 mg Oral BID Opyd, Ilene Qua, MD   12.5 mg at 10/18/17 1012  . midodrine (PROAMATINE) tablet 2.5 mg  2.5 mg Oral BID WC Opyd, Ilene Qua, MD   2.5 mg at 10/17/17 1725  . multivitamin (PROSIGHT) tablet 1 tablet  1 tablet Oral Daily Opyd, Ilene Qua, MD   1 tablet at 10/18/17 1012  . ondansetron (ZOFRAN) tablet 4 mg  4 mg Oral Q6H PRN Opyd, Ilene Qua, MD       Or  . ondansetron (ZOFRAN) injection 4 mg  4 mg Intravenous Q6H PRN Opyd, Ilene Qua, MD      . senna (SENOKOT) tablet 8.6 mg  1 tablet Oral Daily PRN Opyd, Ilene Qua, MD      . sodium chloride flush (NS) 0.9 % injection 3 mL  3 mL Intravenous Q12H Opyd, Ilene Qua, MD   3 mL at 10/18/17 1013     Discharge Medications: Please see discharge summary for a list of discharge medications.  Relevant Imaging Results:  Relevant Lab Results:   Additional Information SS#: [id number]  Geralynn Ochs, LCSW

## 2017-10-18 NOTE — Progress Notes (Signed)
PROGRESS NOTE    Carl Rios  YKD:[id number] DOB: Jul 14, 1926 DOA: 10/16/2017 PCP: Carl Redwood, MD   Outpatient Specialists:     Brief Narrative:  Carl Rios is a 83 y.o. male with medical history significant for history of CVA, dementia, hypertension, paroxysmal atrial tachycardia, orthostatic hypotension, depression, and peripheral neuropathy, now presenting to the emergency department from his nursing facility for evaluation of unsteady gait with dizziness and falls.  Patient was discharged from the hospital 1 month ago after similar presentation and underwent workup with essentially normal carotid Dopplers, echocardiogram with preserved EF and no significant valvular disease, was recommended for discharge to SNF, and started on Midrin.  He continues to have unsteady gait with frequent dizzy spells while ambulating and had an acute episode overnight with near syncope, sliding to the ground without losing consciousness or his head.  He was transported to the ED for further evaluation of this.  Denies any significant pain or injury.      Assessment & Plan:   Principal Problem:   Unsteady gait Active Problems:   Recurrent falls   Depression   Orthostasis   Gait difficulty   History of CVA (cerebrovascular accident)   Paroxysmal atrial tachycardia (HCC)   Unsteady gait; frequent falls  - Pt presents with unsteady gait and lightheadedness on standing with frequent falls  - Admitted for same 1 month ago, had echo with preserved EF and no significant valvular disease, and normal carotid dopplers; he was started on midodrine and recommended for SNF but could not afford  - Likely secondary to orthostasis, autonomic dysfunction/ changes on MRI: Progressive atrophy, hydrocephalus ex vacuo, and small vessel disease since 2015. - continue midodrine, start ted hoes  Orthostasis  - Pt has known orthostasis and was recently started on midodrine  -was on alpha blocker-- flomax - He  is lightheaded on standing in ED, reports near-syncope prior to arrival   - Start ted hose, ensure adequate hydration, counseled regarding fall-risk   Depression  - Stable, continue Cymbalta    Paroxysmal atrial tachycardia  - Was in ectopic atrial tachycardia briefly in ED, resolved with IVP Lopressor  - Nursing home reports that he refused his metoprolol yesterday  - Resume metoprolol, continue cardiac monitoring -does not appear to be a fib  delirium on top of dementia -requiring sitter -somewhat improved today, not as impulsive and able to articulate he needed to use the restroom   DVT prophylaxis:  SCD's  Code Status: Full Code   Family Communication: Called son who asked me to call [facility] [phone])  Disposition Plan:  SNF when work up finished   Consultants:        Subjective: Needs to "go number 2"  Objective: Vitals:   10/18/17 0152 10/18/17 0615 10/18/17 1050 10/18/17 1417  BP: 133/85 (!) 150/96  119/83  Pulse: 71 79  82  Resp: 20 18 18 17   Temp:   97.8 F (36.6 C) 98.1 F (36.7 C)  TempSrc:   Oral Oral  SpO2: 97% 95% 95% 96%  Weight:      Height:        Intake/Output Summary (Last 24 hours) at 10/18/2017 1513 Last data filed at 10/18/2017 1049 Gross per 24 hour  Intake 120 ml  Output 100 ml  Net 20 ml   Filed Weights   10/16/17 1833  Weight: 79.4 kg (175 lb)    Examination:  General exam: more interactive, less confused Respiratory system: clear Cardiovascular system: rrr Gastrointestinal  system: +Bs, soft Central nervous system: alert-- VERY hard of hearing Extremities: moves all 4 ext     Data Reviewed: I have personally reviewed following labs and imaging studies  CBC: Recent Labs  Lab 10/16/17 1937 10/17/17 0429  WBC 11.2* 10.2  HGB 15.5 15.1  HCT 46.3 46.6  MCV 90.3 90.8  PLT 342 106   Basic Metabolic Panel: Recent Labs  Lab 10/16/17 2052 10/17/17 0429  NA 134* 137  K 3.6 3.5  CL 102 106    CO2 21* 21*  GLUCOSE 82 101*  BUN 15 13  CREATININE 0.94 0.94  CALCIUM 8.2* 8.2*   GFR: Estimated Creatinine Clearance: 51.5 mL/min (by C-G formula based on SCr of 0.94 mg/dL). Liver Function Tests: No results for input(s): AST, ALT, ALKPHOS, BILITOT, PROT, ALBUMIN in the last 168 hours. No results for input(s): LIPASE, AMYLASE in the last 168 hours. No results for input(s): AMMONIA in the last 168 hours. Coagulation Profile: No results for input(s): INR, PROTIME in the last 168 hours. Cardiac Enzymes: No results for input(s): CKTOTAL, CKMB, CKMBINDEX, TROPONINI in the last 168 hours. BNP (last 3 results) No results for input(s): PROBNP in the last 8760 hours. HbA1C: No results for input(s): HGBA1C in the last 72 hours. CBG: Recent Labs  Lab 10/16/17 1944 10/18/17 0808  GLUCAP 74 89   Lipid Profile: No results for input(s): CHOL, HDL, LDLCALC, TRIG, CHOLHDL, LDLDIRECT in the last 72 hours. Thyroid Function Tests: No results for input(s): TSH, T4TOTAL, FREET4, T3FREE, THYROIDAB in the last 72 hours. Anemia Panel: Recent Labs    10/17/17 0429  VITAMINB12 378   Urine analysis:    Component Value Date/Time   COLORURINE YELLOW 09/08/2017 [place] 09/08/2017 1515   LABSPEC 1.019 09/08/2017 1515   PHURINE 6.0 09/08/2017 1515   GLUCOSEU NEGATIVE 09/08/2017 1515   HGBUR NEGATIVE 09/08/2017 [place] 09/08/2017 1515   KETONESUR NEGATIVE 09/08/2017 1515   PROTEINUR NEGATIVE 09/08/2017 1515   UROBILINOGEN 1.0 07/26/2015 1745   NITRITE NEGATIVE 09/08/2017 1515   LEUKOCYTESUR NEGATIVE 09/08/2017 1515     )No results found for this or any previous visit (from the past 240 hour(s)).    Anti-infectives (From admission, onward)   None       Radiology Studies: Mr Brain Wo Contrast  Result Date: 10/17/2017 CLINICAL DATA:  Unsteady gait. Dizziness and falls. History of orthostasis, autonomic dysfunction. EXAM: MRI HEAD WITHOUT  CONTRAST TECHNIQUE: Multiplanar, multiecho pulse sequences of the brain and surrounding structures were obtained without intravenous contrast. COMPARISON:  CT head 07/26/2015.  MR head 03/29/2014. FINDINGS: Brain: No evidence for acute infarction, hemorrhage, mass lesion, or extra-axial fluid. Generalized atrophy. Extensive chronic microvascular ischemic change. Widespread areas of deep white matter, deep nuclei, and brainstem infarction. Chronic RIGHT occipital infarction was acute in 2015. Vascular: Flow voids are maintained throughout the carotid, basilar, and vertebral arteries. Skull and upper cervical spine: Normal marrow signal. Sinuses/Orbits: No layering fluid.  BILATERAL cataract extraction. Other: Compared with priors, progression small vessel disease and ischemic change. IMPRESSION: Progressive atrophy, hydrocephalus ex vacuo, and small vessel disease since 2015. No acute intracranial abnormality on today's exam. Electronically Signed   By: Staci Righter M.D.   On: 10/17/2017 12:01        Scheduled Meds: . aspirin EC  81 mg Oral Daily  . DULoxetine  60 mg Oral Daily  . enoxaparin (LOVENOX) injection  40 mg Subcutaneous Q24H  . fluticasone  2 spray  Each Nare QHS  . loratadine  10 mg Oral Daily  . metoprolol tartrate  12.5 mg Oral BID  . midodrine  2.5 mg Oral BID WC  . multivitamin  1 tablet Oral Daily  . sodium chloride flush  3 mL Intravenous Q12H   Continuous Infusions:   Rios: 1 day    Time spent: 25 min    Geradine Girt, DO [facility] Pager [phone]  If 7PM-7AM, please contact night-coverage [email or web address] Password [email or web address] 10/18/2017, 3:13 PM

## 2017-10-19 LAB — GLUCOSE, CAPILLARY: GLUCOSE-CAPILLARY: 92 mg/dL (ref 65–99)

## 2017-10-19 NOTE — Progress Notes (Signed)
PROGRESS NOTE    Carl Rios  EXB:[id number] DOB: [birth year] DOA: 10/16/2017 PCP: Carl Redwood, MD   Outpatient Specialists:     Brief Narrative:  Carl Rios is a 83 y.o. male with medical history significant for history of CVA, dementia, hypertension, paroxysmal atrial tachycardia, orthostatic hypotension, depression, and peripheral neuropathy, now presenting to the emergency department from his nursing facility for evaluation of unsteady gait with dizziness and falls.  Patient was discharged from the hospital 1 month ago after similar presentation and underwent workup with essentially normal carotid Dopplers, echocardiogram with preserved EF and no significant valvular disease, was recommended for discharge to SNF, and started on Midrin.  He continues to have unsteady gait with frequent dizzy spells while ambulating and had an acute episode overnight with near syncope, sliding to the ground without losing consciousness or his head.  He was transported to the ED for further evaluation of this.  Denies any significant pain or injury.      Assessment & Plan:   Principal Problem:   Unsteady gait Active Problems:   Recurrent falls   Depression   Orthostasis   Gait difficulty   History of CVA (cerebrovascular accident)   Paroxysmal atrial tachycardia (HCC)   Unsteady gait; frequent falls  -likely multifactorial but will benefit from PT - Pt presents with unsteady gait and lightheadedness on standing with frequent falls  - Admitted for same 1 month ago, had echo with preserved EF and no significant valvular disease, and normal carotid dopplers; he was started on midodrine and recommended for SNF but could not afford  - Likely secondary to orthostasis, autonomic dysfunction/ changes on MRI: Progressive atrophy, hydrocephalus ex vacuo, and small vessel disease since 2015. - continue midodrine,  ted hoes  Orthostasis  - Pt has known orthostasis and was recently started on  midodrine  -was on alpha blocker-- flomax - Start ted hose, ensure adequate hydration, counseled regarding fall-risk   Depression  - Stable, continue Cymbalta    Paroxysmal atrial tachycardia  - Was in ectopic atrial tachycardia briefly in ED, resolved with IVP Lopressor  - Nursing home reports that he refused his metoprolol yesterday  - Resume metoprolol, continue cardiac monitoring -does not appear to be a fib  delirium on top of dementia -sitter d/c'd this AM -improved today as more re-directable   DVT prophylaxis:  SCD's  Code Status: Full Code   Family Communication: Called son who asked me to call Carl [facility]ios [phone])-- left message 12/18  Disposition Plan:  SNF in AM   Consultants:        Subjective: Very hard of hearing-- no matter what question I asked, 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  Objective: Vitals:   10/18/17 2037 10/19/17 0611 10/19/17 1004 10/19/17 1357  BP: 131/61 (!) 130/56 139/81 (!) 156/87  Pulse: 77 66 80 75  Resp: 18 18 17 18   Temp: 98.1 F (36.7 C) 97.7 F (36.5 C) 97.6 F (36.4 C) 98 F (36.7 C)  TempSrc: Oral Oral Axillary Axillary  SpO2: 91%  93% 95%  Weight:  80.3 kg (177 lb 0.5 oz)    Height:        Intake/Output Summary (Last 24 hours) at 10/19/2017 1701 Last data filed at 10/19/2017 0857 Gross per 24 hour  Intake 525 ml  Output 350 ml  Net 175 ml   Filed Weights   10/16/17 1833 10/19/17 0611  Weight: 79.4 kg (175 lb) 80.3 kg (177  lb 0.5 oz)    Examination:  General exam: NAD- very hard of hearing Respiratory system: no wheezing Cardiovascular system: rrr Gastrointestinal system: +BS Central nervous system: no focal deficits, orientation difficult to assess due to hearing issues      Data Reviewed: I have personally reviewed following labs and imaging studies  CBC: Recent Labs  Lab 10/16/17 1937 10/17/17 0429  WBC 11.2* 10.2  HGB 15.5 15.1  HCT 46.3 46.6   45.3  MCV 90.3 90.8  PLT 342 270   Basic Metabolic Panel: Recent Labs  Lab 10/16/17 2052 10/17/17 0429  NA 134* 137  K 3.6 3.5  CL 102 106  CO2 21* 21*  GLUCOSE 82 101*  BUN 15 13  CREATININE 0.94 0.94  CALCIUM 8.2* 8.2*   GFR: Estimated Creatinine Clearance: 51.5 mL/min (by C-G formula based on SCr of 0.94 mg/dL). Liver Function Tests: No results for input(s): AST, ALT, ALKPHOS, BILITOT, PROT, ALBUMIN in the last 168 hours. No results for input(s): LIPASE, AMYLASE in the last 168 hours. No results for input(s): AMMONIA in the last 168 hours. Coagulation Profile: No results for input(s): INR, PROTIME in the last 168 hours. Cardiac Enzymes: No results for input(s): CKTOTAL, CKMB, CKMBINDEX, TROPONINI in the last 168 hours. BNP (last 3 results) No results for input(s): PROBNP in the last 8760 hours. HbA1C: No results for input(s): HGBA1C in the last 72 hours. CBG: Recent Labs  Lab 10/16/17 1944 10/18/17 0808 10/19/17 0630  GLUCAP 74 89 92   Lipid Profile: No results for input(s): CHOL, HDL, LDLCALC, TRIG, CHOLHDL, LDLDIRECT in the last 72 hours. Thyroid Function Tests: No results for input(s): TSH, T4TOTAL, FREET4, T3FREE, THYROIDAB in the last 72 hours. Anemia Panel: Recent Labs    10/17/17 0429  VITAMINB12 378   Urine analysis:    Component Value Date/Time   COLORURINE YELLOW 09/08/2017 [place] 09/08/2017 1515   LABSPEC 1.019 09/08/2017 1515   PHURINE 6.0 09/08/2017 1515   GLUCOSEU NEGATIVE 09/08/2017 1515   HGBUR NEGATIVE 09/08/2017 [place] 09/08/2017 1515   KETONESUR NEGATIVE 09/08/2017 1515   PROTEINUR NEGATIVE 09/08/2017 1515   UROBILINOGEN 1.0 07/26/2015 1745   NITRITE NEGATIVE 09/08/2017 1515   LEUKOCYTESUR NEGATIVE 09/08/2017 1515     )No results found for this or any previous visit (from the past 240 hour(s)).    Anti-infectives (From admission, onward)   None       Radiology Studies: No  results found.      Scheduled Meds: . aspirin EC  81 mg Oral Daily  . DULoxetine  60 mg Oral Daily  . enoxaparin (LOVENOX) injection  40 mg Subcutaneous Q24H  . fluticasone  2 spray Each Nare QHS  . loratadine  10 mg Oral Daily  . metoprolol tartrate  12.5 mg Oral BID  . midodrine  2.5 mg Oral BID WC  . multivitamin  1 tablet Oral Daily  . sodium chloride flush  3 mL Intravenous Q12H   Continuous Infusions:   LOS: 2 days    Time spent: 25 min    Geradine Girt, DO [facility] Pager [phone]  If 7PM-7AM, please contact night-coverage [email or web address] Password [email or web address] 10/19/2017, 5:01 PM

## 2017-10-20 DIAGNOSIS — F339 Major depressive disorder, recurrent, unspecified: Secondary | ICD-10-CM | POA: Diagnosis not present

## 2017-10-20 DIAGNOSIS — R2681 Unsteadiness on feet: Secondary | ICD-10-CM | POA: Diagnosis not present

## 2017-10-20 DIAGNOSIS — R1312 Dysphagia, oropharyngeal phase: Secondary | ICD-10-CM | POA: Diagnosis not present

## 2017-10-20 DIAGNOSIS — K219 Gastro-esophageal reflux disease without esophagitis: Secondary | ICD-10-CM | POA: Diagnosis not present

## 2017-10-20 DIAGNOSIS — M6281 Muscle weakness (generalized): Secondary | ICD-10-CM | POA: Diagnosis not present

## 2017-10-20 DIAGNOSIS — I951 Orthostatic hypotension: Secondary | ICD-10-CM | POA: Diagnosis not present

## 2017-10-20 DIAGNOSIS — G8911 Acute pain due to trauma: Secondary | ICD-10-CM | POA: Diagnosis not present

## 2017-10-20 DIAGNOSIS — F0390 Unspecified dementia without behavioral disturbance: Secondary | ICD-10-CM | POA: Diagnosis not present

## 2017-10-20 DIAGNOSIS — R41841 Cognitive communication deficit: Secondary | ICD-10-CM | POA: Diagnosis not present

## 2017-10-20 DIAGNOSIS — R42 Dizziness and giddiness: Secondary | ICD-10-CM

## 2017-10-20 DIAGNOSIS — R4182 Altered mental status, unspecified: Secondary | ICD-10-CM | POA: Diagnosis not present

## 2017-10-20 DIAGNOSIS — R278 Other lack of coordination: Secondary | ICD-10-CM | POA: Diagnosis not present

## 2017-10-20 DIAGNOSIS — R296 Repeated falls: Secondary | ICD-10-CM | POA: Diagnosis not present

## 2017-10-20 DIAGNOSIS — I471 Supraventricular tachycardia: Secondary | ICD-10-CM | POA: Diagnosis not present

## 2017-10-20 DIAGNOSIS — Z9181 History of falling: Secondary | ICD-10-CM | POA: Diagnosis not present

## 2017-10-20 DIAGNOSIS — F419 Anxiety disorder, unspecified: Secondary | ICD-10-CM | POA: Diagnosis not present

## 2017-10-20 LAB — GLUCOSE, CAPILLARY: Glucose-Capillary: 103 mg/dL — ABNORMAL HIGH (ref 65–99)

## 2017-10-20 NOTE — Progress Notes (Signed)
Physical Therapy Treatment Patient Details Name: Carl Rios MRN: [id number] DOB: August 04, 1926 Today's Date: 10/20/2017    History of Present Illness 83 y.o. male admitted for feeling dizzy and falling. Pt found to be in paroxsymal atrial tachycardia upon arrival at hospital. Pertinent PMH includes CVA (2015), rhabdomyolysis, recurrent falls, dizziness, HTN, PVD, AAA, peripheral neuropathy, macular degeneration, HOH, orthostatic hypotension, and dementia..    PT Comments    Pt received in bed semirecumbent, incontinent of urine, asleep, but easily awakened with verbal greeting. Pt is pleasant and conversational, but is conditionally agreeable to participate with little interest in participating in AMB at this time d/t chronic back pain. He is agreeable to transfer to chair with assistance, which requires modA physical assist, and near maximal effort on his part to perform. Once in chair he reports comfort. Communication is limited by his severe hardness of hearing, but visual cues are intermittently helpful. Pt remains weak and unstable at this time. He seems mildly confused, but no caregiver is available to determine if altered or baseline.     Follow Up Recommendations  SNF;Supervision/Assistance - 24 hour     Equipment Recommendations  None recommended by PT    Recommendations for Other Services       Precautions / Restrictions Precautions Precautions: Fall Restrictions Weight Bearing Restrictions: No    Mobility  Bed Mobility Overal bed mobility: Needs Assistance Bed Mobility: Supine to Sit     Supine to sit: Mod assist;HOB elevated     General bed mobility comments: Remains grossly weak, limited independent LE limb movement without self assist c arms, rrotation to EOB requires heavy physical assist as well.  Once seated EOB, the patient appears stable.   Transfers Overall transfer level: Needs assistance Equipment used: 1 person hand held assist Transfers: Sit to/from  Stand;Stand Pivot Transfers(heavy reliance on BUE for pulling up without trunk flexion and signs of significant hip extensor weakness. ) Sit to Stand: Mod assist(Continual posterior lean, hanging with BUE hand held assist. Takes slow steps to pivot, with good safety awareness and control descent to chair. ) Stand pivot transfers: Min assist          Ambulation/Gait Ambulation/Gait assistance: (not appropriate at this time. Pt not willing. )               Stairs            Wheelchair Mobility    Modified Rankin (Stroke Patients Only)       Balance     Sitting balance-Leahy Scale: Good Sitting balance - Comments: Pt able to sit EOB without external support.      Standing balance-Leahy Scale: Zero Standing balance comment: Pt utilizes RW and/or therapist assist for external support.                            Cognition Arousal/Alertness: Awake/alert Behavior During Therapy: WFL for tasks assessed/performed Overall Cognitive Status: No family/caregiver present to determine baseline cognitive functioning                                        Exercises      General Comments        Pertinent Vitals/Pain Pain Assessment: No/denies pain    Home Living  Prior Function            PT Goals (current goals can now be found in the care plan section) Acute Rehab PT Goals Patient Stated Goal: to go home PT Goal Formulation: With patient/family Time For Goal Achievement: 10/31/17 Potential to Achieve Goals: Fair Progress towards PT goals: Not progressing toward goals - comment;PT to reassess next treatment    Frequency    Min 2X/week      PT Plan Current plan remains appropriate    Co-evaluation              AM-PAC PT "6 Clicks" Daily Activity  Outcome Measure  Difficulty turning over in bed (including adjusting bedclothes, sheets and blankets)?: A Little Difficulty moving from lying  on back to sitting on the side of the bed? : Unable Difficulty sitting down on and standing up from a chair with arms (e.g., wheelchair, bedside commode, etc,.)?: Unable Help needed moving to and from a bed to chair (including a wheelchair)?: A Lot Help needed walking in hospital room?: A Lot Help needed climbing 3-5 steps with a railing? : Total 6 Click Score: 10    End of Session   Activity Tolerance: Patient limited by fatigue;Patient limited by pain Patient left: in chair;with call bell/phone within reach;with chair alarm set Nurse Communication: Mobility status PT Visit Diagnosis: Unsteadiness on feet (R26.81);Difficulty in walking, not elsewhere classified (R26.2);History of falling (Z91.81)     Time: 3903-0092 PT Time Calculation (min) (ACUTE ONLY): 14 min  Charges:  $Therapeutic Activity: 8-22 mins                    G Codes:       11:31 AM, Carl Rios/01/Rios Etta Grandchild, PT, DPT Relief Physical Therapist - [place] [phone] [facility])  [phone] (Office)      Buccola,Carl Rios, Carl Rios, 11:28 AM

## 2017-10-20 NOTE — Clinical Social Work Note (Signed)
LATE NOTE SUBMISSION   Clinical Social Work Assessment  Patient Details  Name: Carl Rios MRN: [id number] Date of Birth: Jan 14, 1926  Date of referral:  10/18/17               Reason for consult:  Facility Placement                Permission sought to share information with:  Facility Sport and exercise psychologist, Family Supports Permission granted to share information::  Yes, Verbal Permission Granted  Name::     Rod  Agency::  SNF  Relationship::  Son  Contact Information:     Housing/Transportation Living arrangements for the past 2 months:  [place] of Information:  Adult Children Patient Interpreter Needed:  None Criminal Activity/Legal Involvement Pertinent to Current Situation/Hospitalization:  No - Comment as needed Significant Relationships:  Adult Children Lives with:  Self, Facility Resident Do you feel safe going back to the place where you live?  Yes Need for family participation in patient care:  Yes (Comment)(patient not oriented)  Care giving concerns:  Patient has been living at [facility] but will benefit from SNF at discharge before returning to ALF.   Social Worker assessment / plan:  CSW met with patient and patient's son, Rod, at bedside to discuss recommendation for SNF. CSW provided facility list and discussed referral process. CSW obtained facility preferences from patient's son. CSW completed referral and faxed out. CSW to follow up with facilities to determine bed availability.  Employment status:  Retired Forensic scientist:  Medicare PT Recommendations:  [place] / Referral to community resources:  [place]  Patient/Family's Response to care:  Patient and family agreeable to SNF Placement.  Patient/Family's Understanding of and Emotional Response to Diagnosis, Current Treatment, and Prognosis:  Patient and family were appreciative of information, and patient's son requested  either [place] [facility] or [place] for facility. Patient's son would like to be kept updated on discharge. Patient will need PTAR.  Emotional Assessment Appearance:  Appears stated age Attitude/Demeanor/Rapport:  Unable to Assess Affect (typically observed):  Unable to Assess Orientation:  Oriented to Self, Oriented to Place, Oriented to  Time Alcohol / Substance use:  Not Applicable Psych involvement (Current and /or in the community):  No (Comment)  Discharge Needs  Concerns to be addressed:  Care Coordination Readmission within the last 30 days:  No Current discharge risk:  Lives alone Barriers to Discharge:  Continued Medical Work up, Ship broker, Requiring sitter/restraints   Geralynn Ochs, [place] 10/20/2017, 12:26 PM

## 2017-10-20 NOTE — Discharge Summary (Signed)
Physician Discharge Summary  Carl Rios JSE:[id number] DOB: 1925-12-08 DOA: 10/16/2017  PCP: Carl Redwood, MD  Admit date: 10/16/2017 Discharge date: 10/20/2017  Time spent: >35 minutes  Recommendations for Outpatient Follow-up:  MD at the facility  Neurology in 1-2 weeks   Discharge Diagnoses:  Principal Problem:   Unsteady gait Active Problems:   Recurrent falls   Depression   Orthostasis   Gait difficulty   History of CVA (cerebrovascular accident)   Paroxysmal atrial tachycardia [facility])   Discharge Condition: stable   Diet recommendation: regular   Filed Weights   10/16/17 1833 10/19/17 0611 10/20/17 0544  Weight: 79.4 kg (175 lb) 80.3 kg (177 lb 0.5 oz) 78 kg (171 lb 15.3 oz)    History of present illness:  Carl Schoneman Phelpsis a 83 y.o.malewith medical history significant forhistory of CVA, dementia, hypertension, paroxysmal atrial tachycardia, orthostatic hypotension, depression, and peripheral neuropathy, now presenting to the emergency department from his nursing facility for evaluation of unsteady gait with dizziness and falls. Patient was discharged from the hospital 1 month ago after similar presentation and underwent workup with essentially normal carotid Dopplers, echocardiogram with preserved EF and no significant valvular disease, was recommended for discharge to SNF, and started on Midrin. He continues to have unsteady gait with frequent dizzy spells while ambulating and had an acute episode overnight with near syncope, sliding to the ground without losing consciousness or his head. He was transported to the ED for further evaluation of this. Denies any significant pain or injury.     Hospital Course:    Principal Problem:   Unsteady gait Active Problems:   Recurrent falls   Depression   Orthostasis   Gait difficulty   History of CVA (cerebrovascular accident)   Paroxysmal atrial tachycardia (HCC)   Unsteady gait; frequent falls. likely  multifactorial but will benefit from PT. Possible underlying NPH. Likely secondary to orthostasis, autonomic dysfunction/ changes on MRI: Progressive atrophy, hydrocephalus ex vacuo, and small vessel disease since 2015. -Pt presents with unsteady gait and lightheadedness on standing with frequent falls. Admitted for same 1 month ago, had echo with preserved EF and no significant valvular disease, and normal carotid dopplers; he was started on midodrine and recommended for SNF but could not afford.  continue midodrine,  ted hoes. D/c SNF. May need outpatient neurology eval   Orthostasis. Pt has known orthostasis and was recently started on midodrine. was on alpha blocker-- flomax -Started ted hose, ensure adequate hydration, counseled regarding fall-risk  Depression. Stable, continue Cymbalta  Paroxysmal atrial tachycardia. Was in ectopic atrial tachycardia briefly in ED, resolved with IVP Lopressor. Resume metoprolol, does not appear to be a fib  delirium on top of dementia. improved today as more re-directable    Procedures:  none (i.e. Studies not automatically included, echos, thoracentesis, etc; not x-rays)  Consultations:  none  Discharge Exam: Vitals:   10/20/17 0544 10/20/17 0949  BP: (!) 143/90 139/90  Pulse: 76 82  Resp: 18 17  Temp: 97.9 F (36.6 C) (!) 97.5 F (36.4 C)  SpO2: 93% 91%    General: alert. No distress Cardiovascular: s1,s2 rrr Respiratory: CTA BL  Discharge Instructions  Discharge Instructions    Diet - low sodium heart healthy   Complete by:  As directed    Increase activity slowly   Complete by:  As directed      Allergies as of 10/20/2017      Reactions   Sulfamethoxazole Other (See Comments)   UNKNOWN  Medication List    TAKE these medications   acetaminophen 325 MG tablet Commonly known as:  TYLENOL Take 650 mg by mouth every 6 (six) hours as needed.   aspirin 81 MG EC tablet Take 1 tablet (81 mg total) by  mouth daily.   DULoxetine 30 MG capsule Commonly known as:  CYMBALTA Take 1 capsule (30 mg total) by mouth daily. What changed:  how much to take   fluticasone 50 MCG/ACT nasal spray Commonly known as:  FLONASE Place into both nostrils at bedtime.   loratadine 10 MG tablet Commonly known as:  CLARITIN Take 10 mg by mouth daily.   MACULAR VITAMIN BENEFIT Tabs Take by mouth.   metoprolol tartrate 25 MG tablet Commonly known as:  LOPRESSOR Take 12.5 mg by mouth 2 (two) times daily.   midodrine 2.5 MG tablet Commonly known as:  PROAMATINE Take 1 tablet (2.5 mg total) 2 (two) times daily with a meal by mouth.   polyethylene glycol packet Commonly known as:  MIRALAX / GLYCOLAX Take 17 g daily as needed by mouth for mild constipation.   senna 8.6 MG Tabs tablet Commonly known as:  SENOKOT Take 1 tablet (8.6 mg total) 2 (two) times daily by mouth. What changed:    when to take this  reasons to take this   tamsulosin 0.4 MG Caps capsule Commonly known as:  FLOMAX Take 0.4 mg daily with supper by mouth.   Vitamin D (Ergocalciferol) 50000 units Caps capsule Commonly known as:  DRISDOL Take 50,000 Units by mouth every 7 (seven) days.      Allergies  Allergen Reactions  . Sulfamethoxazole Other (See Comments)    UNKNOWN   Contact information for after-discharge care    Destination    [facility] .   Service:  Skilled Nursing Contact information: [place] [phone]               The results of significant diagnostics from this hospitalization (including imaging, microbiology, ancillary and laboratory) are listed below for reference.    Significant Diagnostic Studies: Mr Brain Wo Contrast  Result Date: 10/17/2017 CLINICAL DATA:  Unsteady gait. Dizziness and falls. History of orthostasis, autonomic dysfunction. EXAM: MRI HEAD WITHOUT CONTRAST TECHNIQUE: Multiplanar, multiecho pulse  sequences of the brain and surrounding structures were obtained without intravenous contrast. COMPARISON:  CT head 07/26/2015.  MR head 03/29/2014. FINDINGS: Brain: No evidence for acute infarction, hemorrhage, mass lesion, or extra-axial fluid. Generalized atrophy. Extensive chronic microvascular ischemic change. Widespread areas of deep white matter, deep nuclei, and brainstem infarction. Chronic RIGHT occipital infarction was acute in 2015. Vascular: Flow voids are maintained throughout the carotid, basilar, and vertebral arteries. Skull and upper cervical spine: Normal marrow signal. Sinuses/Orbits: No layering fluid.  BILATERAL cataract extraction. Other: Compared with priors, progression small vessel disease and ischemic change. IMPRESSION: Progressive atrophy, hydrocephalus ex vacuo, and small vessel disease since 2015. No acute intracranial abnormality on today's exam. Electronically Signed   By: Staci Righter M.D.   On: 10/17/2017 12:01    Microbiology: No results found for this or any previous visit (from the past 240 hour(s)).   Labs: Basic Metabolic Panel: Recent Labs  Lab 10/16/17 2052 10/17/17 0429  NA 134* 137  K 3.6 3.5  CL 102 106  CO2 21* 21*  GLUCOSE 82 101*  BUN 15 13  CREATININE 0.94 0.94  CALCIUM 8.2* 8.2*   Liver Function Tests: No results for input(s):  AST, ALT, ALKPHOS, BILITOT, PROT, ALBUMIN in the last 168 hours. No results for input(s): LIPASE, AMYLASE in the last 168 hours. No results for input(s): AMMONIA in the last 168 hours. CBC: Recent Labs  Lab 10/16/17 1937 10/17/17 0429  WBC 11.2* 10.2  HGB 15.5 15.1  HCT 46.3 46.6  45.3  MCV 90.3 90.8  PLT 342 349   Cardiac Enzymes: No results for input(s): CKTOTAL, CKMB, CKMBINDEX, TROPONINI in the last 168 hours. BNP: BNP (last 3 results) No results for input(s): BNP in the last 8760 hours.  ProBNP (last 3 results) No results for input(s): PROBNP in the last 8760 hours.  CBG: Recent Labs  Lab  10/16/17 1944 10/18/17 0808 10/19/17 0630 10/20/17 0628  GLUCAP 74 89 92 103*       Signed:  Gleen Ripberger N  [facility] 10/20/2017, 10:00 AM

## 2017-10-20 NOTE — Clinical Social Work Placement (Signed)
Nurse to call report to [phone], [place]  NOTE  Date:  10/20/2017  Patient Details  Name: Carl Rios MRN: [id number] Date of Birth: December 22, 1925  Clinical Social Work is seeking post-discharge placement for this patient at the [place] level of care (*CSW will initial, date and re-position this form in  chart as items are completed):  Yes   Patient/family provided with [facility] Work Department's list of facilities offering this level of care within the geographic area requested by the patient (or if unable, by the patient's family).  Yes   Patient/family informed of their freedom to choose among providers that offer the needed level of care, that participate in Medicare, Medicaid or managed care program needed by the patient, have an available bed and are willing to accept the patient.  Yes   Patient/family informed of [facility]'s ownership interest in [facility] and [facility], as well as of the fact that they are under no obligation to receive care at these facilities.  PASRR submitted to EDS on 10/18/17     PASRR number received on 10/18/17     Existing PASRR number confirmed on       FL2 transmitted to all facilities in geographic area requested by pt/family on 10/18/17     FL2 transmitted to all facilities within larger geographic area on       Patient informed that his/her managed care company has contracts with or will negotiate with certain facilities, including the following:        Yes   Patient/family informed of bed offers received.  Patient chooses bed at [facility]     Physician recommends and patient chooses bed at      Patient to be transferred to [facility] on 10/20/17.  Patient to be transferred to facility by [facility]     Patient family notified on 10/20/17 of transfer.  Name of family member notified:  Rod     PHYSICIAN      Additional Comment:    _______________________________________________ Geralynn Ochs, LCSW 10/20/2017, 12:11 PM

## 2017-10-27 DIAGNOSIS — F0390 Unspecified dementia without behavioral disturbance: Secondary | ICD-10-CM | POA: Diagnosis not present

## 2017-10-27 DIAGNOSIS — I951 Orthostatic hypotension: Secondary | ICD-10-CM | POA: Diagnosis not present

## 2017-10-27 DIAGNOSIS — I471 Supraventricular tachycardia: Secondary | ICD-10-CM | POA: Diagnosis not present

## 2017-10-27 DIAGNOSIS — R296 Repeated falls: Secondary | ICD-10-CM | POA: Diagnosis not present

## 2017-10-27 DIAGNOSIS — F419 Anxiety disorder, unspecified: Secondary | ICD-10-CM | POA: Diagnosis not present

## 2017-11-08 DIAGNOSIS — F0390 Unspecified dementia without behavioral disturbance: Secondary | ICD-10-CM | POA: Diagnosis not present

## 2017-11-08 DIAGNOSIS — F339 Major depressive disorder, recurrent, unspecified: Secondary | ICD-10-CM | POA: Diagnosis not present

## 2017-11-08 DIAGNOSIS — I471 Supraventricular tachycardia: Secondary | ICD-10-CM | POA: Diagnosis not present

## 2017-11-08 DIAGNOSIS — R296 Repeated falls: Secondary | ICD-10-CM | POA: Diagnosis not present

## 2017-11-08 DIAGNOSIS — I951 Orthostatic hypotension: Secondary | ICD-10-CM | POA: Diagnosis not present

## 2017-11-18 DIAGNOSIS — I10 Essential (primary) hypertension: Secondary | ICD-10-CM | POA: Diagnosis not present

## 2017-11-18 DIAGNOSIS — M810 Age-related osteoporosis without current pathological fracture: Secondary | ICD-10-CM | POA: Diagnosis not present

## 2017-11-18 DIAGNOSIS — I471 Supraventricular tachycardia: Secondary | ICD-10-CM | POA: Diagnosis not present

## 2017-11-18 DIAGNOSIS — G629 Polyneuropathy, unspecified: Secondary | ICD-10-CM | POA: Diagnosis not present

## 2017-11-18 DIAGNOSIS — G6289 Other specified polyneuropathies: Secondary | ICD-10-CM | POA: Diagnosis not present

## 2017-11-18 DIAGNOSIS — H3530 Unspecified macular degeneration: Secondary | ICD-10-CM | POA: Diagnosis not present

## 2017-11-18 DIAGNOSIS — H9190 Unspecified hearing loss, unspecified ear: Secondary | ICD-10-CM | POA: Diagnosis not present

## 2017-11-18 DIAGNOSIS — I951 Orthostatic hypotension: Secondary | ICD-10-CM | POA: Diagnosis not present

## 2017-11-18 DIAGNOSIS — I739 Peripheral vascular disease, unspecified: Secondary | ICD-10-CM | POA: Diagnosis not present

## 2017-11-18 DIAGNOSIS — F0390 Unspecified dementia without behavioral disturbance: Secondary | ICD-10-CM | POA: Diagnosis not present

## 2017-11-18 DIAGNOSIS — R1312 Dysphagia, oropharyngeal phase: Secondary | ICD-10-CM | POA: Diagnosis not present

## 2017-11-18 DIAGNOSIS — F3289 Other specified depressive episodes: Secondary | ICD-10-CM | POA: Diagnosis not present

## 2017-11-18 DIAGNOSIS — Z8673 Personal history of transient ischemic attack (TIA), and cerebral infarction without residual deficits: Secondary | ICD-10-CM | POA: Diagnosis not present

## 2017-11-18 DIAGNOSIS — I7389 Other specified peripheral vascular diseases: Secondary | ICD-10-CM | POA: Diagnosis not present

## 2017-11-22 DIAGNOSIS — I739 Peripheral vascular disease, unspecified: Secondary | ICD-10-CM | POA: Diagnosis not present

## 2017-11-22 DIAGNOSIS — F0390 Unspecified dementia without behavioral disturbance: Secondary | ICD-10-CM | POA: Diagnosis not present

## 2017-11-22 DIAGNOSIS — G629 Polyneuropathy, unspecified: Secondary | ICD-10-CM | POA: Diagnosis not present

## 2017-11-22 DIAGNOSIS — R1312 Dysphagia, oropharyngeal phase: Secondary | ICD-10-CM | POA: Diagnosis not present

## 2017-11-22 DIAGNOSIS — I951 Orthostatic hypotension: Secondary | ICD-10-CM | POA: Diagnosis not present

## 2017-11-22 DIAGNOSIS — I10 Essential (primary) hypertension: Secondary | ICD-10-CM | POA: Diagnosis not present

## 2017-11-23 DIAGNOSIS — F0390 Unspecified dementia without behavioral disturbance: Secondary | ICD-10-CM | POA: Diagnosis not present

## 2017-11-23 DIAGNOSIS — I739 Peripheral vascular disease, unspecified: Secondary | ICD-10-CM | POA: Diagnosis not present

## 2017-11-23 DIAGNOSIS — I951 Orthostatic hypotension: Secondary | ICD-10-CM | POA: Diagnosis not present

## 2017-11-23 DIAGNOSIS — R1312 Dysphagia, oropharyngeal phase: Secondary | ICD-10-CM | POA: Diagnosis not present

## 2017-11-23 DIAGNOSIS — G629 Polyneuropathy, unspecified: Secondary | ICD-10-CM | POA: Diagnosis not present

## 2017-11-23 DIAGNOSIS — I10 Essential (primary) hypertension: Secondary | ICD-10-CM | POA: Diagnosis not present

## 2017-11-24 DIAGNOSIS — I951 Orthostatic hypotension: Secondary | ICD-10-CM | POA: Diagnosis not present

## 2017-11-24 DIAGNOSIS — R1312 Dysphagia, oropharyngeal phase: Secondary | ICD-10-CM | POA: Diagnosis not present

## 2017-11-24 DIAGNOSIS — I739 Peripheral vascular disease, unspecified: Secondary | ICD-10-CM | POA: Diagnosis not present

## 2017-11-24 DIAGNOSIS — I10 Essential (primary) hypertension: Secondary | ICD-10-CM | POA: Diagnosis not present

## 2017-11-24 DIAGNOSIS — G629 Polyneuropathy, unspecified: Secondary | ICD-10-CM | POA: Diagnosis not present

## 2017-11-24 DIAGNOSIS — F0390 Unspecified dementia without behavioral disturbance: Secondary | ICD-10-CM | POA: Diagnosis not present

## 2017-11-25 DIAGNOSIS — I951 Orthostatic hypotension: Secondary | ICD-10-CM | POA: Diagnosis not present

## 2017-11-25 DIAGNOSIS — I739 Peripheral vascular disease, unspecified: Secondary | ICD-10-CM | POA: Diagnosis not present

## 2017-11-25 DIAGNOSIS — I10 Essential (primary) hypertension: Secondary | ICD-10-CM | POA: Diagnosis not present

## 2017-11-25 DIAGNOSIS — G629 Polyneuropathy, unspecified: Secondary | ICD-10-CM | POA: Diagnosis not present

## 2017-11-25 DIAGNOSIS — R1312 Dysphagia, oropharyngeal phase: Secondary | ICD-10-CM | POA: Diagnosis not present

## 2017-11-25 DIAGNOSIS — F0390 Unspecified dementia without behavioral disturbance: Secondary | ICD-10-CM | POA: Diagnosis not present

## 2017-11-28 DIAGNOSIS — R1312 Dysphagia, oropharyngeal phase: Secondary | ICD-10-CM | POA: Diagnosis not present

## 2017-11-28 DIAGNOSIS — I951 Orthostatic hypotension: Secondary | ICD-10-CM | POA: Diagnosis not present

## 2017-11-28 DIAGNOSIS — I739 Peripheral vascular disease, unspecified: Secondary | ICD-10-CM | POA: Diagnosis not present

## 2017-11-28 DIAGNOSIS — G629 Polyneuropathy, unspecified: Secondary | ICD-10-CM | POA: Diagnosis not present

## 2017-11-28 DIAGNOSIS — F0390 Unspecified dementia without behavioral disturbance: Secondary | ICD-10-CM | POA: Diagnosis not present

## 2017-11-28 DIAGNOSIS — I10 Essential (primary) hypertension: Secondary | ICD-10-CM | POA: Diagnosis not present

## 2017-11-29 DIAGNOSIS — F0390 Unspecified dementia without behavioral disturbance: Secondary | ICD-10-CM | POA: Diagnosis not present

## 2017-11-29 DIAGNOSIS — I951 Orthostatic hypotension: Secondary | ICD-10-CM | POA: Diagnosis not present

## 2017-11-29 DIAGNOSIS — G629 Polyneuropathy, unspecified: Secondary | ICD-10-CM | POA: Diagnosis not present

## 2017-11-29 DIAGNOSIS — N390 Urinary tract infection, site not specified: Secondary | ICD-10-CM | POA: Diagnosis not present

## 2017-11-29 DIAGNOSIS — R1312 Dysphagia, oropharyngeal phase: Secondary | ICD-10-CM | POA: Diagnosis not present

## 2017-11-29 DIAGNOSIS — I10 Essential (primary) hypertension: Secondary | ICD-10-CM | POA: Diagnosis not present

## 2017-11-29 DIAGNOSIS — I739 Peripheral vascular disease, unspecified: Secondary | ICD-10-CM | POA: Diagnosis not present

## 2017-11-30 DIAGNOSIS — G629 Polyneuropathy, unspecified: Secondary | ICD-10-CM | POA: Diagnosis not present

## 2017-11-30 DIAGNOSIS — I951 Orthostatic hypotension: Secondary | ICD-10-CM | POA: Diagnosis not present

## 2017-11-30 DIAGNOSIS — I10 Essential (primary) hypertension: Secondary | ICD-10-CM | POA: Diagnosis not present

## 2017-11-30 DIAGNOSIS — F0390 Unspecified dementia without behavioral disturbance: Secondary | ICD-10-CM | POA: Diagnosis not present

## 2017-11-30 DIAGNOSIS — I739 Peripheral vascular disease, unspecified: Secondary | ICD-10-CM | POA: Diagnosis not present

## 2017-11-30 DIAGNOSIS — R1312 Dysphagia, oropharyngeal phase: Secondary | ICD-10-CM | POA: Diagnosis not present

## 2017-12-01 DIAGNOSIS — F0390 Unspecified dementia without behavioral disturbance: Secondary | ICD-10-CM | POA: Diagnosis not present

## 2017-12-01 DIAGNOSIS — I951 Orthostatic hypotension: Secondary | ICD-10-CM | POA: Diagnosis not present

## 2017-12-01 DIAGNOSIS — R1312 Dysphagia, oropharyngeal phase: Secondary | ICD-10-CM | POA: Diagnosis not present

## 2017-12-01 DIAGNOSIS — I10 Essential (primary) hypertension: Secondary | ICD-10-CM | POA: Diagnosis not present

## 2017-12-01 DIAGNOSIS — G629 Polyneuropathy, unspecified: Secondary | ICD-10-CM | POA: Diagnosis not present

## 2017-12-01 DIAGNOSIS — I739 Peripheral vascular disease, unspecified: Secondary | ICD-10-CM | POA: Diagnosis not present

## 2017-12-02 DIAGNOSIS — F0390 Unspecified dementia without behavioral disturbance: Secondary | ICD-10-CM | POA: Diagnosis not present

## 2017-12-02 DIAGNOSIS — R1312 Dysphagia, oropharyngeal phase: Secondary | ICD-10-CM | POA: Diagnosis not present

## 2017-12-02 DIAGNOSIS — I739 Peripheral vascular disease, unspecified: Secondary | ICD-10-CM | POA: Diagnosis not present

## 2017-12-02 DIAGNOSIS — I951 Orthostatic hypotension: Secondary | ICD-10-CM | POA: Diagnosis not present

## 2017-12-02 DIAGNOSIS — G629 Polyneuropathy, unspecified: Secondary | ICD-10-CM | POA: Diagnosis not present

## 2017-12-02 DIAGNOSIS — I10 Essential (primary) hypertension: Secondary | ICD-10-CM | POA: Diagnosis not present

## 2017-12-05 DIAGNOSIS — I739 Peripheral vascular disease, unspecified: Secondary | ICD-10-CM | POA: Diagnosis not present

## 2017-12-05 DIAGNOSIS — G629 Polyneuropathy, unspecified: Secondary | ICD-10-CM | POA: Diagnosis not present

## 2017-12-05 DIAGNOSIS — F0390 Unspecified dementia without behavioral disturbance: Secondary | ICD-10-CM | POA: Diagnosis not present

## 2017-12-05 DIAGNOSIS — I951 Orthostatic hypotension: Secondary | ICD-10-CM | POA: Diagnosis not present

## 2017-12-05 DIAGNOSIS — I10 Essential (primary) hypertension: Secondary | ICD-10-CM | POA: Diagnosis not present

## 2017-12-05 DIAGNOSIS — R1312 Dysphagia, oropharyngeal phase: Secondary | ICD-10-CM | POA: Diagnosis not present

## 2017-12-07 DIAGNOSIS — R1312 Dysphagia, oropharyngeal phase: Secondary | ICD-10-CM | POA: Diagnosis not present

## 2017-12-07 DIAGNOSIS — F0390 Unspecified dementia without behavioral disturbance: Secondary | ICD-10-CM | POA: Diagnosis not present

## 2017-12-07 DIAGNOSIS — G629 Polyneuropathy, unspecified: Secondary | ICD-10-CM | POA: Diagnosis not present

## 2017-12-07 DIAGNOSIS — I10 Essential (primary) hypertension: Secondary | ICD-10-CM | POA: Diagnosis not present

## 2017-12-07 DIAGNOSIS — I739 Peripheral vascular disease, unspecified: Secondary | ICD-10-CM | POA: Diagnosis not present

## 2017-12-07 DIAGNOSIS — I951 Orthostatic hypotension: Secondary | ICD-10-CM | POA: Diagnosis not present

## 2017-12-08 DIAGNOSIS — I10 Essential (primary) hypertension: Secondary | ICD-10-CM | POA: Diagnosis not present

## 2017-12-08 DIAGNOSIS — I951 Orthostatic hypotension: Secondary | ICD-10-CM | POA: Diagnosis not present

## 2017-12-08 DIAGNOSIS — I739 Peripheral vascular disease, unspecified: Secondary | ICD-10-CM | POA: Diagnosis not present

## 2017-12-08 DIAGNOSIS — R1312 Dysphagia, oropharyngeal phase: Secondary | ICD-10-CM | POA: Diagnosis not present

## 2017-12-08 DIAGNOSIS — F0390 Unspecified dementia without behavioral disturbance: Secondary | ICD-10-CM | POA: Diagnosis not present

## 2017-12-08 DIAGNOSIS — G629 Polyneuropathy, unspecified: Secondary | ICD-10-CM | POA: Diagnosis not present

## 2017-12-13 DIAGNOSIS — I739 Peripheral vascular disease, unspecified: Secondary | ICD-10-CM | POA: Diagnosis not present

## 2017-12-13 DIAGNOSIS — I951 Orthostatic hypotension: Secondary | ICD-10-CM | POA: Diagnosis not present

## 2017-12-13 DIAGNOSIS — I10 Essential (primary) hypertension: Secondary | ICD-10-CM | POA: Diagnosis not present

## 2017-12-13 DIAGNOSIS — R1312 Dysphagia, oropharyngeal phase: Secondary | ICD-10-CM | POA: Diagnosis not present

## 2017-12-13 DIAGNOSIS — F0390 Unspecified dementia without behavioral disturbance: Secondary | ICD-10-CM | POA: Diagnosis not present

## 2017-12-13 DIAGNOSIS — G629 Polyneuropathy, unspecified: Secondary | ICD-10-CM | POA: Diagnosis not present

## 2017-12-14 DIAGNOSIS — G629 Polyneuropathy, unspecified: Secondary | ICD-10-CM | POA: Diagnosis not present

## 2017-12-14 DIAGNOSIS — F0390 Unspecified dementia without behavioral disturbance: Secondary | ICD-10-CM | POA: Diagnosis not present

## 2017-12-14 DIAGNOSIS — I10 Essential (primary) hypertension: Secondary | ICD-10-CM | POA: Diagnosis not present

## 2017-12-14 DIAGNOSIS — R1312 Dysphagia, oropharyngeal phase: Secondary | ICD-10-CM | POA: Diagnosis not present

## 2017-12-14 DIAGNOSIS — I739 Peripheral vascular disease, unspecified: Secondary | ICD-10-CM | POA: Diagnosis not present

## 2017-12-14 DIAGNOSIS — I951 Orthostatic hypotension: Secondary | ICD-10-CM | POA: Diagnosis not present

## 2017-12-15 DIAGNOSIS — R1312 Dysphagia, oropharyngeal phase: Secondary | ICD-10-CM | POA: Diagnosis not present

## 2017-12-15 DIAGNOSIS — I739 Peripheral vascular disease, unspecified: Secondary | ICD-10-CM | POA: Diagnosis not present

## 2017-12-15 DIAGNOSIS — F0390 Unspecified dementia without behavioral disturbance: Secondary | ICD-10-CM | POA: Diagnosis not present

## 2017-12-15 DIAGNOSIS — I951 Orthostatic hypotension: Secondary | ICD-10-CM | POA: Diagnosis not present

## 2017-12-15 DIAGNOSIS — G629 Polyneuropathy, unspecified: Secondary | ICD-10-CM | POA: Diagnosis not present

## 2017-12-15 DIAGNOSIS — I10 Essential (primary) hypertension: Secondary | ICD-10-CM | POA: Diagnosis not present

## 2017-12-16 DIAGNOSIS — I10 Essential (primary) hypertension: Secondary | ICD-10-CM | POA: Diagnosis not present

## 2017-12-16 DIAGNOSIS — I739 Peripheral vascular disease, unspecified: Secondary | ICD-10-CM | POA: Diagnosis not present

## 2017-12-16 DIAGNOSIS — G629 Polyneuropathy, unspecified: Secondary | ICD-10-CM | POA: Diagnosis not present

## 2017-12-16 DIAGNOSIS — I951 Orthostatic hypotension: Secondary | ICD-10-CM | POA: Diagnosis not present

## 2017-12-16 DIAGNOSIS — F0390 Unspecified dementia without behavioral disturbance: Secondary | ICD-10-CM | POA: Diagnosis not present

## 2017-12-16 DIAGNOSIS — R1312 Dysphagia, oropharyngeal phase: Secondary | ICD-10-CM | POA: Diagnosis not present

## 2017-12-20 DIAGNOSIS — I10 Essential (primary) hypertension: Secondary | ICD-10-CM | POA: Diagnosis not present

## 2017-12-20 DIAGNOSIS — I739 Peripheral vascular disease, unspecified: Secondary | ICD-10-CM | POA: Diagnosis not present

## 2017-12-20 DIAGNOSIS — R1312 Dysphagia, oropharyngeal phase: Secondary | ICD-10-CM | POA: Diagnosis not present

## 2017-12-20 DIAGNOSIS — G629 Polyneuropathy, unspecified: Secondary | ICD-10-CM | POA: Diagnosis not present

## 2017-12-20 DIAGNOSIS — F0390 Unspecified dementia without behavioral disturbance: Secondary | ICD-10-CM | POA: Diagnosis not present

## 2017-12-20 DIAGNOSIS — I951 Orthostatic hypotension: Secondary | ICD-10-CM | POA: Diagnosis not present

## 2017-12-22 DIAGNOSIS — G629 Polyneuropathy, unspecified: Secondary | ICD-10-CM | POA: Diagnosis not present

## 2017-12-22 DIAGNOSIS — I951 Orthostatic hypotension: Secondary | ICD-10-CM | POA: Diagnosis not present

## 2017-12-22 DIAGNOSIS — R1312 Dysphagia, oropharyngeal phase: Secondary | ICD-10-CM | POA: Diagnosis not present

## 2017-12-22 DIAGNOSIS — I10 Essential (primary) hypertension: Secondary | ICD-10-CM | POA: Diagnosis not present

## 2017-12-22 DIAGNOSIS — I739 Peripheral vascular disease, unspecified: Secondary | ICD-10-CM | POA: Diagnosis not present

## 2017-12-22 DIAGNOSIS — F0390 Unspecified dementia without behavioral disturbance: Secondary | ICD-10-CM | POA: Diagnosis not present

## 2017-12-26 DIAGNOSIS — G629 Polyneuropathy, unspecified: Secondary | ICD-10-CM | POA: Diagnosis not present

## 2017-12-26 DIAGNOSIS — F0390 Unspecified dementia without behavioral disturbance: Secondary | ICD-10-CM | POA: Diagnosis not present

## 2017-12-26 DIAGNOSIS — I739 Peripheral vascular disease, unspecified: Secondary | ICD-10-CM | POA: Diagnosis not present

## 2017-12-26 DIAGNOSIS — I10 Essential (primary) hypertension: Secondary | ICD-10-CM | POA: Diagnosis not present

## 2017-12-26 DIAGNOSIS — I951 Orthostatic hypotension: Secondary | ICD-10-CM | POA: Diagnosis not present

## 2017-12-26 DIAGNOSIS — R1312 Dysphagia, oropharyngeal phase: Secondary | ICD-10-CM | POA: Diagnosis not present

## 2017-12-27 DIAGNOSIS — I739 Peripheral vascular disease, unspecified: Secondary | ICD-10-CM | POA: Diagnosis not present

## 2017-12-27 DIAGNOSIS — F0390 Unspecified dementia without behavioral disturbance: Secondary | ICD-10-CM | POA: Diagnosis not present

## 2017-12-27 DIAGNOSIS — I10 Essential (primary) hypertension: Secondary | ICD-10-CM | POA: Diagnosis not present

## 2017-12-27 DIAGNOSIS — R1312 Dysphagia, oropharyngeal phase: Secondary | ICD-10-CM | POA: Diagnosis not present

## 2017-12-27 DIAGNOSIS — G629 Polyneuropathy, unspecified: Secondary | ICD-10-CM | POA: Diagnosis not present

## 2017-12-27 DIAGNOSIS — I951 Orthostatic hypotension: Secondary | ICD-10-CM | POA: Diagnosis not present

## 2017-12-28 DIAGNOSIS — I10 Essential (primary) hypertension: Secondary | ICD-10-CM | POA: Diagnosis not present

## 2017-12-28 DIAGNOSIS — G629 Polyneuropathy, unspecified: Secondary | ICD-10-CM | POA: Diagnosis not present

## 2017-12-28 DIAGNOSIS — I739 Peripheral vascular disease, unspecified: Secondary | ICD-10-CM | POA: Diagnosis not present

## 2017-12-28 DIAGNOSIS — F0390 Unspecified dementia without behavioral disturbance: Secondary | ICD-10-CM | POA: Diagnosis not present

## 2017-12-28 DIAGNOSIS — I951 Orthostatic hypotension: Secondary | ICD-10-CM | POA: Diagnosis not present

## 2017-12-28 DIAGNOSIS — R1312 Dysphagia, oropharyngeal phase: Secondary | ICD-10-CM | POA: Diagnosis not present

## 2017-12-29 DIAGNOSIS — I10 Essential (primary) hypertension: Secondary | ICD-10-CM | POA: Diagnosis not present

## 2017-12-29 DIAGNOSIS — F0390 Unspecified dementia without behavioral disturbance: Secondary | ICD-10-CM | POA: Diagnosis not present

## 2017-12-29 DIAGNOSIS — I739 Peripheral vascular disease, unspecified: Secondary | ICD-10-CM | POA: Diagnosis not present

## 2017-12-29 DIAGNOSIS — I951 Orthostatic hypotension: Secondary | ICD-10-CM | POA: Diagnosis not present

## 2017-12-29 DIAGNOSIS — G629 Polyneuropathy, unspecified: Secondary | ICD-10-CM | POA: Diagnosis not present

## 2017-12-29 DIAGNOSIS — R1312 Dysphagia, oropharyngeal phase: Secondary | ICD-10-CM | POA: Diagnosis not present

## 2017-12-30 DIAGNOSIS — I951 Orthostatic hypotension: Secondary | ICD-10-CM | POA: Diagnosis not present

## 2017-12-30 DIAGNOSIS — G629 Polyneuropathy, unspecified: Secondary | ICD-10-CM | POA: Diagnosis not present

## 2017-12-30 DIAGNOSIS — I739 Peripheral vascular disease, unspecified: Secondary | ICD-10-CM | POA: Diagnosis not present

## 2017-12-30 DIAGNOSIS — R1312 Dysphagia, oropharyngeal phase: Secondary | ICD-10-CM | POA: Diagnosis not present

## 2017-12-30 DIAGNOSIS — F0390 Unspecified dementia without behavioral disturbance: Secondary | ICD-10-CM | POA: Diagnosis not present

## 2017-12-30 DIAGNOSIS — I10 Essential (primary) hypertension: Secondary | ICD-10-CM | POA: Diagnosis not present

## 2018-01-02 DIAGNOSIS — R1312 Dysphagia, oropharyngeal phase: Secondary | ICD-10-CM | POA: Diagnosis not present

## 2018-01-02 DIAGNOSIS — I739 Peripheral vascular disease, unspecified: Secondary | ICD-10-CM | POA: Diagnosis not present

## 2018-01-02 DIAGNOSIS — G629 Polyneuropathy, unspecified: Secondary | ICD-10-CM | POA: Diagnosis not present

## 2018-01-02 DIAGNOSIS — F0390 Unspecified dementia without behavioral disturbance: Secondary | ICD-10-CM | POA: Diagnosis not present

## 2018-01-02 DIAGNOSIS — I10 Essential (primary) hypertension: Secondary | ICD-10-CM | POA: Diagnosis not present

## 2018-01-02 DIAGNOSIS — I951 Orthostatic hypotension: Secondary | ICD-10-CM | POA: Diagnosis not present

## 2018-01-03 DIAGNOSIS — R1312 Dysphagia, oropharyngeal phase: Secondary | ICD-10-CM | POA: Diagnosis not present

## 2018-01-03 DIAGNOSIS — I10 Essential (primary) hypertension: Secondary | ICD-10-CM | POA: Diagnosis not present

## 2018-01-03 DIAGNOSIS — I739 Peripheral vascular disease, unspecified: Secondary | ICD-10-CM | POA: Diagnosis not present

## 2018-01-03 DIAGNOSIS — F0390 Unspecified dementia without behavioral disturbance: Secondary | ICD-10-CM | POA: Diagnosis not present

## 2018-01-03 DIAGNOSIS — G629 Polyneuropathy, unspecified: Secondary | ICD-10-CM | POA: Diagnosis not present

## 2018-01-03 DIAGNOSIS — I951 Orthostatic hypotension: Secondary | ICD-10-CM | POA: Diagnosis not present

## 2018-01-04 DIAGNOSIS — I10 Essential (primary) hypertension: Secondary | ICD-10-CM | POA: Diagnosis not present

## 2018-01-04 DIAGNOSIS — F0390 Unspecified dementia without behavioral disturbance: Secondary | ICD-10-CM | POA: Diagnosis not present

## 2018-01-04 DIAGNOSIS — I739 Peripheral vascular disease, unspecified: Secondary | ICD-10-CM | POA: Diagnosis not present

## 2018-01-04 DIAGNOSIS — R1312 Dysphagia, oropharyngeal phase: Secondary | ICD-10-CM | POA: Diagnosis not present

## 2018-01-04 DIAGNOSIS — G629 Polyneuropathy, unspecified: Secondary | ICD-10-CM | POA: Diagnosis not present

## 2018-01-04 DIAGNOSIS — I951 Orthostatic hypotension: Secondary | ICD-10-CM | POA: Diagnosis not present

## 2018-01-05 DIAGNOSIS — G629 Polyneuropathy, unspecified: Secondary | ICD-10-CM | POA: Diagnosis not present

## 2018-01-05 DIAGNOSIS — I10 Essential (primary) hypertension: Secondary | ICD-10-CM | POA: Diagnosis not present

## 2018-01-05 DIAGNOSIS — F0390 Unspecified dementia without behavioral disturbance: Secondary | ICD-10-CM | POA: Diagnosis not present

## 2018-01-05 DIAGNOSIS — R1312 Dysphagia, oropharyngeal phase: Secondary | ICD-10-CM | POA: Diagnosis not present

## 2018-01-05 DIAGNOSIS — I951 Orthostatic hypotension: Secondary | ICD-10-CM | POA: Diagnosis not present

## 2018-01-05 DIAGNOSIS — I739 Peripheral vascular disease, unspecified: Secondary | ICD-10-CM | POA: Diagnosis not present

## 2018-01-06 DIAGNOSIS — R1312 Dysphagia, oropharyngeal phase: Secondary | ICD-10-CM | POA: Diagnosis not present

## 2018-01-06 DIAGNOSIS — I10 Essential (primary) hypertension: Secondary | ICD-10-CM | POA: Diagnosis not present

## 2018-01-06 DIAGNOSIS — G629 Polyneuropathy, unspecified: Secondary | ICD-10-CM | POA: Diagnosis not present

## 2018-01-06 DIAGNOSIS — I739 Peripheral vascular disease, unspecified: Secondary | ICD-10-CM | POA: Diagnosis not present

## 2018-01-06 DIAGNOSIS — F0390 Unspecified dementia without behavioral disturbance: Secondary | ICD-10-CM | POA: Diagnosis not present

## 2018-01-06 DIAGNOSIS — I951 Orthostatic hypotension: Secondary | ICD-10-CM | POA: Diagnosis not present

## 2018-01-09 DIAGNOSIS — G629 Polyneuropathy, unspecified: Secondary | ICD-10-CM | POA: Diagnosis not present

## 2018-01-09 DIAGNOSIS — I739 Peripheral vascular disease, unspecified: Secondary | ICD-10-CM | POA: Diagnosis not present

## 2018-01-09 DIAGNOSIS — I10 Essential (primary) hypertension: Secondary | ICD-10-CM | POA: Diagnosis not present

## 2018-01-09 DIAGNOSIS — I951 Orthostatic hypotension: Secondary | ICD-10-CM | POA: Diagnosis not present

## 2018-01-09 DIAGNOSIS — F0390 Unspecified dementia without behavioral disturbance: Secondary | ICD-10-CM | POA: Diagnosis not present

## 2018-01-09 DIAGNOSIS — R1312 Dysphagia, oropharyngeal phase: Secondary | ICD-10-CM | POA: Diagnosis not present

## 2018-01-10 DIAGNOSIS — R1312 Dysphagia, oropharyngeal phase: Secondary | ICD-10-CM | POA: Diagnosis not present

## 2018-01-10 DIAGNOSIS — G629 Polyneuropathy, unspecified: Secondary | ICD-10-CM | POA: Diagnosis not present

## 2018-01-10 DIAGNOSIS — I739 Peripheral vascular disease, unspecified: Secondary | ICD-10-CM | POA: Diagnosis not present

## 2018-01-10 DIAGNOSIS — F0390 Unspecified dementia without behavioral disturbance: Secondary | ICD-10-CM | POA: Diagnosis not present

## 2018-01-10 DIAGNOSIS — I10 Essential (primary) hypertension: Secondary | ICD-10-CM | POA: Diagnosis not present

## 2018-01-10 DIAGNOSIS — I951 Orthostatic hypotension: Secondary | ICD-10-CM | POA: Diagnosis not present

## 2018-01-12 DIAGNOSIS — I739 Peripheral vascular disease, unspecified: Secondary | ICD-10-CM | POA: Diagnosis not present

## 2018-01-12 DIAGNOSIS — F0390 Unspecified dementia without behavioral disturbance: Secondary | ICD-10-CM | POA: Diagnosis not present

## 2018-01-12 DIAGNOSIS — R1312 Dysphagia, oropharyngeal phase: Secondary | ICD-10-CM | POA: Diagnosis not present

## 2018-01-12 DIAGNOSIS — I10 Essential (primary) hypertension: Secondary | ICD-10-CM | POA: Diagnosis not present

## 2018-01-12 DIAGNOSIS — I951 Orthostatic hypotension: Secondary | ICD-10-CM | POA: Diagnosis not present

## 2018-01-12 DIAGNOSIS — G629 Polyneuropathy, unspecified: Secondary | ICD-10-CM | POA: Diagnosis not present

## 2018-01-13 DIAGNOSIS — I10 Essential (primary) hypertension: Secondary | ICD-10-CM | POA: Diagnosis not present

## 2018-01-13 DIAGNOSIS — G629 Polyneuropathy, unspecified: Secondary | ICD-10-CM | POA: Diagnosis not present

## 2018-01-13 DIAGNOSIS — I951 Orthostatic hypotension: Secondary | ICD-10-CM | POA: Diagnosis not present

## 2018-01-13 DIAGNOSIS — R1312 Dysphagia, oropharyngeal phase: Secondary | ICD-10-CM | POA: Diagnosis not present

## 2018-01-13 DIAGNOSIS — I739 Peripheral vascular disease, unspecified: Secondary | ICD-10-CM | POA: Diagnosis not present

## 2018-01-13 DIAGNOSIS — F0390 Unspecified dementia without behavioral disturbance: Secondary | ICD-10-CM | POA: Diagnosis not present

## 2018-01-16 DIAGNOSIS — G629 Polyneuropathy, unspecified: Secondary | ICD-10-CM | POA: Diagnosis not present

## 2018-01-16 DIAGNOSIS — I10 Essential (primary) hypertension: Secondary | ICD-10-CM | POA: Diagnosis not present

## 2018-01-16 DIAGNOSIS — I739 Peripheral vascular disease, unspecified: Secondary | ICD-10-CM | POA: Diagnosis not present

## 2018-01-16 DIAGNOSIS — R1312 Dysphagia, oropharyngeal phase: Secondary | ICD-10-CM | POA: Diagnosis not present

## 2018-01-16 DIAGNOSIS — F0390 Unspecified dementia without behavioral disturbance: Secondary | ICD-10-CM | POA: Diagnosis not present

## 2018-01-16 DIAGNOSIS — I951 Orthostatic hypotension: Secondary | ICD-10-CM | POA: Diagnosis not present

## 2018-01-19 DIAGNOSIS — R2681 Unsteadiness on feet: Secondary | ICD-10-CM | POA: Diagnosis not present

## 2018-01-19 DIAGNOSIS — I951 Orthostatic hypotension: Secondary | ICD-10-CM | POA: Diagnosis not present

## 2018-01-19 DIAGNOSIS — M6281 Muscle weakness (generalized): Secondary | ICD-10-CM | POA: Diagnosis not present

## 2018-01-19 DIAGNOSIS — R262 Difficulty in walking, not elsewhere classified: Secondary | ICD-10-CM | POA: Diagnosis not present

## 2018-01-20 DIAGNOSIS — R262 Difficulty in walking, not elsewhere classified: Secondary | ICD-10-CM | POA: Diagnosis not present

## 2018-01-20 DIAGNOSIS — R2681 Unsteadiness on feet: Secondary | ICD-10-CM | POA: Diagnosis not present

## 2018-01-20 DIAGNOSIS — M6281 Muscle weakness (generalized): Secondary | ICD-10-CM | POA: Diagnosis not present

## 2018-01-20 DIAGNOSIS — I951 Orthostatic hypotension: Secondary | ICD-10-CM | POA: Diagnosis not present

## 2018-01-23 DIAGNOSIS — I6389 Other cerebral infarction: Secondary | ICD-10-CM | POA: Diagnosis not present

## 2018-01-23 DIAGNOSIS — E669 Obesity, unspecified: Secondary | ICD-10-CM | POA: Diagnosis not present

## 2018-01-23 DIAGNOSIS — M818 Other osteoporosis without current pathological fracture: Secondary | ICD-10-CM | POA: Diagnosis not present

## 2018-01-23 DIAGNOSIS — I951 Orthostatic hypotension: Secondary | ICD-10-CM | POA: Diagnosis not present

## 2018-01-23 DIAGNOSIS — F0281 Dementia in other diseases classified elsewhere with behavioral disturbance: Secondary | ICD-10-CM | POA: Diagnosis not present

## 2018-01-23 DIAGNOSIS — E559 Vitamin D deficiency, unspecified: Secondary | ICD-10-CM | POA: Diagnosis not present

## 2018-01-23 DIAGNOSIS — N400 Enlarged prostate without lower urinary tract symptoms: Secondary | ICD-10-CM | POA: Diagnosis not present

## 2018-01-23 DIAGNOSIS — R600 Localized edema: Secondary | ICD-10-CM | POA: Diagnosis not present

## 2018-01-24 DIAGNOSIS — R2681 Unsteadiness on feet: Secondary | ICD-10-CM | POA: Diagnosis not present

## 2018-01-24 DIAGNOSIS — Z79899 Other long term (current) drug therapy: Secondary | ICD-10-CM | POA: Diagnosis not present

## 2018-01-24 DIAGNOSIS — M6281 Muscle weakness (generalized): Secondary | ICD-10-CM | POA: Diagnosis not present

## 2018-01-24 DIAGNOSIS — I951 Orthostatic hypotension: Secondary | ICD-10-CM | POA: Diagnosis not present

## 2018-01-24 DIAGNOSIS — R262 Difficulty in walking, not elsewhere classified: Secondary | ICD-10-CM | POA: Diagnosis not present

## 2018-01-26 DIAGNOSIS — R262 Difficulty in walking, not elsewhere classified: Secondary | ICD-10-CM | POA: Diagnosis not present

## 2018-01-26 DIAGNOSIS — R2681 Unsteadiness on feet: Secondary | ICD-10-CM | POA: Diagnosis not present

## 2018-01-26 DIAGNOSIS — M6281 Muscle weakness (generalized): Secondary | ICD-10-CM | POA: Diagnosis not present

## 2018-01-26 DIAGNOSIS — I951 Orthostatic hypotension: Secondary | ICD-10-CM | POA: Diagnosis not present

## 2018-01-27 DIAGNOSIS — M6281 Muscle weakness (generalized): Secondary | ICD-10-CM | POA: Diagnosis not present

## 2018-01-27 DIAGNOSIS — I951 Orthostatic hypotension: Secondary | ICD-10-CM | POA: Diagnosis not present

## 2018-01-27 DIAGNOSIS — R2681 Unsteadiness on feet: Secondary | ICD-10-CM | POA: Diagnosis not present

## 2018-01-27 DIAGNOSIS — R262 Difficulty in walking, not elsewhere classified: Secondary | ICD-10-CM | POA: Diagnosis not present

## 2018-01-31 DIAGNOSIS — R262 Difficulty in walking, not elsewhere classified: Secondary | ICD-10-CM | POA: Diagnosis not present

## 2018-01-31 DIAGNOSIS — M6281 Muscle weakness (generalized): Secondary | ICD-10-CM | POA: Diagnosis not present

## 2018-01-31 DIAGNOSIS — R2681 Unsteadiness on feet: Secondary | ICD-10-CM | POA: Diagnosis not present

## 2018-01-31 DIAGNOSIS — R296 Repeated falls: Secondary | ICD-10-CM | POA: Diagnosis not present

## 2018-02-02 DIAGNOSIS — M6281 Muscle weakness (generalized): Secondary | ICD-10-CM | POA: Diagnosis not present

## 2018-02-02 DIAGNOSIS — R2681 Unsteadiness on feet: Secondary | ICD-10-CM | POA: Diagnosis not present

## 2018-02-02 DIAGNOSIS — R296 Repeated falls: Secondary | ICD-10-CM | POA: Diagnosis not present

## 2018-02-02 DIAGNOSIS — R262 Difficulty in walking, not elsewhere classified: Secondary | ICD-10-CM | POA: Diagnosis not present

## 2018-02-03 DIAGNOSIS — R2681 Unsteadiness on feet: Secondary | ICD-10-CM | POA: Diagnosis not present

## 2018-02-03 DIAGNOSIS — R262 Difficulty in walking, not elsewhere classified: Secondary | ICD-10-CM | POA: Diagnosis not present

## 2018-02-03 DIAGNOSIS — M6281 Muscle weakness (generalized): Secondary | ICD-10-CM | POA: Diagnosis not present

## 2018-02-03 DIAGNOSIS — R296 Repeated falls: Secondary | ICD-10-CM | POA: Diagnosis not present

## 2018-02-06 DIAGNOSIS — R296 Repeated falls: Secondary | ICD-10-CM | POA: Diagnosis not present

## 2018-02-06 DIAGNOSIS — R262 Difficulty in walking, not elsewhere classified: Secondary | ICD-10-CM | POA: Diagnosis not present

## 2018-02-06 DIAGNOSIS — M6281 Muscle weakness (generalized): Secondary | ICD-10-CM | POA: Diagnosis not present

## 2018-02-06 DIAGNOSIS — R2681 Unsteadiness on feet: Secondary | ICD-10-CM | POA: Diagnosis not present

## 2018-02-09 DIAGNOSIS — M6281 Muscle weakness (generalized): Secondary | ICD-10-CM | POA: Diagnosis not present

## 2018-02-09 DIAGNOSIS — R296 Repeated falls: Secondary | ICD-10-CM | POA: Diagnosis not present

## 2018-02-09 DIAGNOSIS — R262 Difficulty in walking, not elsewhere classified: Secondary | ICD-10-CM | POA: Diagnosis not present

## 2018-02-09 DIAGNOSIS — R2681 Unsteadiness on feet: Secondary | ICD-10-CM | POA: Diagnosis not present

## 2018-02-10 DIAGNOSIS — R2681 Unsteadiness on feet: Secondary | ICD-10-CM | POA: Diagnosis not present

## 2018-02-10 DIAGNOSIS — R262 Difficulty in walking, not elsewhere classified: Secondary | ICD-10-CM | POA: Diagnosis not present

## 2018-02-10 DIAGNOSIS — R296 Repeated falls: Secondary | ICD-10-CM | POA: Diagnosis not present

## 2018-02-10 DIAGNOSIS — M6281 Muscle weakness (generalized): Secondary | ICD-10-CM | POA: Diagnosis not present

## 2018-02-13 DIAGNOSIS — R262 Difficulty in walking, not elsewhere classified: Secondary | ICD-10-CM | POA: Diagnosis not present

## 2018-02-13 DIAGNOSIS — R2681 Unsteadiness on feet: Secondary | ICD-10-CM | POA: Diagnosis not present

## 2018-02-13 DIAGNOSIS — M6281 Muscle weakness (generalized): Secondary | ICD-10-CM | POA: Diagnosis not present

## 2018-02-13 DIAGNOSIS — R296 Repeated falls: Secondary | ICD-10-CM | POA: Diagnosis not present

## 2018-02-14 DIAGNOSIS — M6281 Muscle weakness (generalized): Secondary | ICD-10-CM | POA: Diagnosis not present

## 2018-02-14 DIAGNOSIS — R2681 Unsteadiness on feet: Secondary | ICD-10-CM | POA: Diagnosis not present

## 2018-02-14 DIAGNOSIS — R296 Repeated falls: Secondary | ICD-10-CM | POA: Diagnosis not present

## 2018-02-14 DIAGNOSIS — R262 Difficulty in walking, not elsewhere classified: Secondary | ICD-10-CM | POA: Diagnosis not present

## 2018-02-16 DIAGNOSIS — R262 Difficulty in walking, not elsewhere classified: Secondary | ICD-10-CM | POA: Diagnosis not present

## 2018-02-16 DIAGNOSIS — R2681 Unsteadiness on feet: Secondary | ICD-10-CM | POA: Diagnosis not present

## 2018-02-16 DIAGNOSIS — R296 Repeated falls: Secondary | ICD-10-CM | POA: Diagnosis not present

## 2018-02-16 DIAGNOSIS — M6281 Muscle weakness (generalized): Secondary | ICD-10-CM | POA: Diagnosis not present

## 2018-02-20 DIAGNOSIS — R262 Difficulty in walking, not elsewhere classified: Secondary | ICD-10-CM | POA: Diagnosis not present

## 2018-02-20 DIAGNOSIS — R296 Repeated falls: Secondary | ICD-10-CM | POA: Diagnosis not present

## 2018-02-20 DIAGNOSIS — R2681 Unsteadiness on feet: Secondary | ICD-10-CM | POA: Diagnosis not present

## 2018-02-20 DIAGNOSIS — M6281 Muscle weakness (generalized): Secondary | ICD-10-CM | POA: Diagnosis not present

## 2018-02-23 DIAGNOSIS — M6281 Muscle weakness (generalized): Secondary | ICD-10-CM | POA: Diagnosis not present

## 2018-02-23 DIAGNOSIS — R2681 Unsteadiness on feet: Secondary | ICD-10-CM | POA: Diagnosis not present

## 2018-02-23 DIAGNOSIS — R296 Repeated falls: Secondary | ICD-10-CM | POA: Diagnosis not present

## 2018-02-23 DIAGNOSIS — R262 Difficulty in walking, not elsewhere classified: Secondary | ICD-10-CM | POA: Diagnosis not present

## 2018-02-28 DIAGNOSIS — R2681 Unsteadiness on feet: Secondary | ICD-10-CM | POA: Diagnosis not present

## 2018-02-28 DIAGNOSIS — M542 Cervicalgia: Secondary | ICD-10-CM | POA: Diagnosis not present

## 2018-02-28 DIAGNOSIS — M6281 Muscle weakness (generalized): Secondary | ICD-10-CM | POA: Diagnosis not present

## 2018-02-28 DIAGNOSIS — R296 Repeated falls: Secondary | ICD-10-CM | POA: Diagnosis not present

## 2018-02-28 DIAGNOSIS — W19XXXA Unspecified fall, initial encounter: Secondary | ICD-10-CM | POA: Diagnosis not present

## 2018-02-28 DIAGNOSIS — R262 Difficulty in walking, not elsewhere classified: Secondary | ICD-10-CM | POA: Diagnosis not present

## 2018-02-28 DIAGNOSIS — R0782 Intercostal pain: Secondary | ICD-10-CM | POA: Diagnosis not present

## 2018-03-01 DIAGNOSIS — Z79899 Other long term (current) drug therapy: Secondary | ICD-10-CM | POA: Diagnosis not present

## 2018-03-01 DIAGNOSIS — I951 Orthostatic hypotension: Secondary | ICD-10-CM | POA: Diagnosis not present

## 2018-03-01 DIAGNOSIS — M6281 Muscle weakness (generalized): Secondary | ICD-10-CM | POA: Diagnosis not present

## 2018-03-01 DIAGNOSIS — S2242XS Multiple fractures of ribs, left side, sequela: Secondary | ICD-10-CM | POA: Diagnosis not present

## 2018-03-01 DIAGNOSIS — R2681 Unsteadiness on feet: Secondary | ICD-10-CM | POA: Diagnosis not present

## 2018-03-01 DIAGNOSIS — R262 Difficulty in walking, not elsewhere classified: Secondary | ICD-10-CM | POA: Diagnosis not present

## 2018-03-01 DIAGNOSIS — R2689 Other abnormalities of gait and mobility: Secondary | ICD-10-CM | POA: Diagnosis not present

## 2018-03-01 DIAGNOSIS — J188 Other pneumonia, unspecified organism: Secondary | ICD-10-CM | POA: Diagnosis not present

## 2018-03-01 DIAGNOSIS — R296 Repeated falls: Secondary | ICD-10-CM | POA: Diagnosis not present

## 2018-03-02 DIAGNOSIS — R262 Difficulty in walking, not elsewhere classified: Secondary | ICD-10-CM | POA: Diagnosis not present

## 2018-03-02 DIAGNOSIS — R296 Repeated falls: Secondary | ICD-10-CM | POA: Diagnosis not present

## 2018-03-02 DIAGNOSIS — M6281 Muscle weakness (generalized): Secondary | ICD-10-CM | POA: Diagnosis not present

## 2018-03-02 DIAGNOSIS — R2681 Unsteadiness on feet: Secondary | ICD-10-CM | POA: Diagnosis not present

## 2018-03-03 DIAGNOSIS — R262 Difficulty in walking, not elsewhere classified: Secondary | ICD-10-CM | POA: Diagnosis not present

## 2018-03-03 DIAGNOSIS — R296 Repeated falls: Secondary | ICD-10-CM | POA: Diagnosis not present

## 2018-03-03 DIAGNOSIS — M6281 Muscle weakness (generalized): Secondary | ICD-10-CM | POA: Diagnosis not present

## 2018-03-03 DIAGNOSIS — R2681 Unsteadiness on feet: Secondary | ICD-10-CM | POA: Diagnosis not present

## 2018-03-06 DIAGNOSIS — S2242XS Multiple fractures of ribs, left side, sequela: Secondary | ICD-10-CM | POA: Diagnosis not present

## 2018-03-06 DIAGNOSIS — R262 Difficulty in walking, not elsewhere classified: Secondary | ICD-10-CM | POA: Diagnosis not present

## 2018-03-06 DIAGNOSIS — M6281 Muscle weakness (generalized): Secondary | ICD-10-CM | POA: Diagnosis not present

## 2018-03-06 DIAGNOSIS — J188 Other pneumonia, unspecified organism: Secondary | ICD-10-CM | POA: Diagnosis not present

## 2018-03-06 DIAGNOSIS — I471 Supraventricular tachycardia: Secondary | ICD-10-CM | POA: Diagnosis not present

## 2018-03-06 DIAGNOSIS — I951 Orthostatic hypotension: Secondary | ICD-10-CM | POA: Diagnosis not present

## 2018-03-06 DIAGNOSIS — G308 Other Alzheimer's disease: Secondary | ICD-10-CM | POA: Diagnosis not present

## 2018-03-06 DIAGNOSIS — R2681 Unsteadiness on feet: Secondary | ICD-10-CM | POA: Diagnosis not present

## 2018-03-06 DIAGNOSIS — R296 Repeated falls: Secondary | ICD-10-CM | POA: Diagnosis not present

## 2018-03-07 DIAGNOSIS — R2681 Unsteadiness on feet: Secondary | ICD-10-CM | POA: Diagnosis not present

## 2018-03-07 DIAGNOSIS — M6281 Muscle weakness (generalized): Secondary | ICD-10-CM | POA: Diagnosis not present

## 2018-03-07 DIAGNOSIS — R296 Repeated falls: Secondary | ICD-10-CM | POA: Diagnosis not present

## 2018-03-07 DIAGNOSIS — R262 Difficulty in walking, not elsewhere classified: Secondary | ICD-10-CM | POA: Diagnosis not present

## 2018-03-08 DIAGNOSIS — M6281 Muscle weakness (generalized): Secondary | ICD-10-CM | POA: Diagnosis not present

## 2018-03-08 DIAGNOSIS — R262 Difficulty in walking, not elsewhere classified: Secondary | ICD-10-CM | POA: Diagnosis not present

## 2018-03-08 DIAGNOSIS — R296 Repeated falls: Secondary | ICD-10-CM | POA: Diagnosis not present

## 2018-03-08 DIAGNOSIS — R2681 Unsteadiness on feet: Secondary | ICD-10-CM | POA: Diagnosis not present

## 2018-03-09 DIAGNOSIS — R2681 Unsteadiness on feet: Secondary | ICD-10-CM | POA: Diagnosis not present

## 2018-03-09 DIAGNOSIS — R262 Difficulty in walking, not elsewhere classified: Secondary | ICD-10-CM | POA: Diagnosis not present

## 2018-03-09 DIAGNOSIS — M6281 Muscle weakness (generalized): Secondary | ICD-10-CM | POA: Diagnosis not present

## 2018-03-09 DIAGNOSIS — R296 Repeated falls: Secondary | ICD-10-CM | POA: Diagnosis not present

## 2018-03-10 DIAGNOSIS — R2681 Unsteadiness on feet: Secondary | ICD-10-CM | POA: Diagnosis not present

## 2018-03-10 DIAGNOSIS — R296 Repeated falls: Secondary | ICD-10-CM | POA: Diagnosis not present

## 2018-03-10 DIAGNOSIS — M6281 Muscle weakness (generalized): Secondary | ICD-10-CM | POA: Diagnosis not present

## 2018-03-10 DIAGNOSIS — R262 Difficulty in walking, not elsewhere classified: Secondary | ICD-10-CM | POA: Diagnosis not present

## 2018-03-13 DIAGNOSIS — R2681 Unsteadiness on feet: Secondary | ICD-10-CM | POA: Diagnosis not present

## 2018-03-13 DIAGNOSIS — R296 Repeated falls: Secondary | ICD-10-CM | POA: Diagnosis not present

## 2018-03-13 DIAGNOSIS — R262 Difficulty in walking, not elsewhere classified: Secondary | ICD-10-CM | POA: Diagnosis not present

## 2018-03-13 DIAGNOSIS — M6281 Muscle weakness (generalized): Secondary | ICD-10-CM | POA: Diagnosis not present

## 2018-03-14 ENCOUNTER — Other Ambulatory Visit: Payer: Self-pay

## 2018-03-14 ENCOUNTER — Emergency Department (HOSPITAL_COMMUNITY): Payer: Medicare Other

## 2018-03-14 ENCOUNTER — Emergency Department (HOSPITAL_COMMUNITY)
Admission: EM | Admit: 2018-03-14 | Discharge: 2018-03-14 | Disposition: A | Discharge status: Home / Self Care | Payer: Medicare Other | Attending: Emergency Medicine | Admitting: Emergency Medicine

## 2018-03-14 ENCOUNTER — Encounter (HOSPITAL_COMMUNITY): Payer: Self-pay | Admitting: Emergency Medicine

## 2018-03-14 DIAGNOSIS — W228XXA Striking against or struck by other objects, initial encounter: Secondary | ICD-10-CM | POA: Insufficient documentation

## 2018-03-14 DIAGNOSIS — T148XXA Other injury of unspecified body region, initial encounter: Secondary | ICD-10-CM | POA: Diagnosis not present

## 2018-03-14 DIAGNOSIS — Z79899 Other long term (current) drug therapy: Secondary | ICD-10-CM | POA: Insufficient documentation

## 2018-03-14 DIAGNOSIS — Y929 Unspecified place or not applicable: Secondary | ICD-10-CM | POA: Insufficient documentation

## 2018-03-14 DIAGNOSIS — S63005A Unspecified dislocation of left wrist and hand, initial encounter: Secondary | ICD-10-CM | POA: Diagnosis not present

## 2018-03-14 DIAGNOSIS — Y999 Unspecified external cause status: Secondary | ICD-10-CM | POA: Diagnosis not present

## 2018-03-14 DIAGNOSIS — Y939 Activity, unspecified: Secondary | ICD-10-CM | POA: Diagnosis not present

## 2018-03-14 DIAGNOSIS — S63255A Unspecified dislocation of left ring finger, initial encounter: Secondary | ICD-10-CM | POA: Diagnosis not present

## 2018-03-14 DIAGNOSIS — Z87891 Personal history of nicotine dependence: Secondary | ICD-10-CM | POA: Diagnosis not present

## 2018-03-14 DIAGNOSIS — S199XXA Unspecified injury of neck, initial encounter: Secondary | ICD-10-CM | POA: Diagnosis not present

## 2018-03-14 DIAGNOSIS — R000 Tachycardia, unspecified: Secondary | ICD-10-CM | POA: Diagnosis not present

## 2018-03-14 DIAGNOSIS — Z7982 Long term (current) use of aspirin: Secondary | ICD-10-CM | POA: Insufficient documentation

## 2018-03-14 DIAGNOSIS — Z743 Need for continuous supervision: Secondary | ICD-10-CM | POA: Diagnosis not present

## 2018-03-14 DIAGNOSIS — S63253A Unspecified dislocation of left middle finger, initial encounter: Secondary | ICD-10-CM | POA: Diagnosis not present

## 2018-03-14 DIAGNOSIS — F0390 Unspecified dementia without behavioral disturbance: Secondary | ICD-10-CM | POA: Insufficient documentation

## 2018-03-14 DIAGNOSIS — S0990XA Unspecified injury of head, initial encounter: Secondary | ICD-10-CM | POA: Diagnosis not present

## 2018-03-14 DIAGNOSIS — E559 Vitamin D deficiency, unspecified: Secondary | ICD-10-CM | POA: Diagnosis not present

## 2018-03-14 DIAGNOSIS — S6991XA Unspecified injury of right wrist, hand and finger(s), initial encounter: Secondary | ICD-10-CM | POA: Diagnosis not present

## 2018-03-14 DIAGNOSIS — S61213A Laceration without foreign body of left middle finger without damage to nail, initial encounter: Secondary | ICD-10-CM | POA: Diagnosis not present

## 2018-03-14 DIAGNOSIS — S2242XA Multiple fractures of ribs, left side, initial encounter for closed fracture: Secondary | ICD-10-CM | POA: Diagnosis not present

## 2018-03-14 DIAGNOSIS — S63281A Dislocation of proximal interphalangeal joint of left index finger, initial encounter: Secondary | ICD-10-CM | POA: Diagnosis not present

## 2018-03-14 DIAGNOSIS — S63283A Dislocation of proximal interphalangeal joint of left middle finger, initial encounter: Secondary | ICD-10-CM | POA: Diagnosis not present

## 2018-03-14 DIAGNOSIS — R279 Unspecified lack of coordination: Secondary | ICD-10-CM | POA: Diagnosis not present

## 2018-03-14 DIAGNOSIS — S63251A Unspecified dislocation of left index finger, initial encounter: Secondary | ICD-10-CM | POA: Diagnosis not present

## 2018-03-14 DIAGNOSIS — S6992XA Unspecified injury of left wrist, hand and finger(s), initial encounter: Secondary | ICD-10-CM | POA: Diagnosis present

## 2018-03-14 DIAGNOSIS — W19XXXA Unspecified fall, initial encounter: Secondary | ICD-10-CM

## 2018-03-14 DIAGNOSIS — S63259A Unspecified dislocation of unspecified finger, initial encounter: Secondary | ICD-10-CM

## 2018-03-14 LAB — BASIC METABOLIC PANEL
Anion gap: 10 (ref 5–15)
BUN: 12 mg/dL (ref 6–20)
CHLORIDE: 105 mmol/L (ref 101–111)
CO2: 24 mmol/L (ref 22–32)
Calcium: 8.5 mg/dL — ABNORMAL LOW (ref 8.9–10.3)
Creatinine, Ser: 0.86 mg/dL (ref 0.61–1.24)
GFR calc Af Amer: >60 mL/min (ref >60)
GFR calc non Af Amer: >60 mL/min (ref >60)
Glucose, Bld: 110 mg/dL — ABNORMAL HIGH (ref 65–99)
POTASSIUM: 4.3 mmol/L (ref 3.5–5.1)
SODIUM: 139 mmol/L (ref 135–145)

## 2018-03-14 LAB — URINALYSIS, ROUTINE W REFLEX MICROSCOPIC
BILIRUBIN URINE: NEGATIVE
Glucose, UA: NEGATIVE mg/dL
Hgb urine dipstick: NEGATIVE
Ketones, ur: 5 mg/dL — AB
Leukocytes, UA: NEGATIVE
NITRITE: NEGATIVE
PROTEIN: NEGATIVE mg/dL
SPECIFIC GRAVITY, URINE: 1.019 (ref 1.005–1.030)
pH: 6 (ref 5.0–8.0)

## 2018-03-14 LAB — CBC WITH DIFFERENTIAL/PLATELET
ABS IMMATURE GRANULOCYTES: 0.1 10*3/uL (ref 0.0–0.1)
BASOS ABS: 0 10*3/uL (ref 0.0–0.1)
Basophils Relative: 0 %
Eosinophils Absolute: 0.1 10*3/uL (ref 0.0–0.7)
Eosinophils Relative: 1 %
HCT: 43.8 % (ref 39.0–52.0)
HEMOGLOBIN: 14.3 g/dL (ref 13.0–17.0)
IMMATURE GRANULOCYTES: 1 %
LYMPHS PCT: 14 %
Lymphs Abs: 1.7 10*3/uL (ref 0.7–4.0)
MCH: 29.9 pg (ref 26.0–34.0)
MCHC: 32.6 g/dL (ref 30.0–36.0)
MCV: 91.4 fL (ref 78.0–100.0)
Monocytes Absolute: 0.6 10*3/uL (ref 0.1–1.0)
Monocytes Relative: 5 %
NEUTROS ABS: 9.6 10*3/uL — AB (ref 1.7–7.7)
Neutrophils Relative %: 79 %
Platelets: 384 10*3/uL (ref 150–400)
RBC: 4.79 MIL/uL (ref 4.22–5.81)
RDW: 15 % (ref 11.5–15.5)
WBC: 12.1 10*3/uL — AB (ref 4.0–10.5)

## 2018-03-14 MED ORDER — CEPHALEXIN 500 MG PO CAPS: 500.0000 mg | ORAL_CAPSULE | Freq: Three times a day (TID) | ORAL | 0 refills | Status: AC

## 2018-03-14 MED ORDER — LIDOCAINE HCL (PF) 1 % IJ SOLN
30.0000 mL | Freq: Once | INTRAMUSCULAR | Status: AC
  Administered 2018-03-14: 30 mL
  Filled 2018-03-14: qty 30

## 2018-03-14 MED ORDER — METOPROLOL TARTRATE 5 MG/5ML IV SOLN
2.5000 mg | Freq: Once | INTRAVENOUS | Status: AC
  Administered 2018-03-14: 2.5 mg via INTRAVENOUS
  Filled 2018-03-14: qty 5

## 2018-03-14 NOTE — Progress Notes (Addendum)
CSW spoke with nurse at [facility]. [facility] does not have SNF. [facility] does allow for home health agencies to come provide additional services. Family members can hire personal care aids to provide more care. CSW will speak with family members about options.   CM spoke with pt's son about home health. Pt's son agreed to home health. Pt is connected with [facility] through [facility].   9:20 PM CSW called [facility] to let them pt is being transported back.   CSW called [facility].   Wendelyn Breslow, [facility]  [phone]

## 2018-03-14 NOTE — ED Notes (Signed)
MD at bedside. 

## 2018-03-14 NOTE — ED Notes (Signed)
Ortho paged r/e splint for L hand

## 2018-03-14 NOTE — ED Notes (Signed)
[facility] called to transport pt home 

## 2018-03-14 NOTE — ED Triage Notes (Signed)
Per [facility]: Patient to ED from [facility] following unwitnessed fall. Per facility, patient has had increase in falls over the past 2 weeks, not ambulatory without assistance, but was trying to walk to the restroom on his own when the fall occurred. Hx dementia, at baseline, but towel rolls in place for precaution. Patient has laceration to L middle finger and bruising to all fingers, able to move fingers and hand, sensation and pulses intact. EMS VS: 144/93, P 89, 90% RA - placed on 4L O2 [place], increased to 94%, CBG 119.

## 2018-03-14 NOTE — Discharge Instructions (Signed)
Call Dr. Amedeo Plenty for appointment in 2 weeks.  Flex, and antibiotic to prevent infection in your dislocated finger/wound

## 2018-03-14 NOTE — Consult Note (Signed)
Reason for Consult: Left hand injury Referring Physician: ER staff  Carl Rios is an 84 y.o. male.  HPI: Patient is a 40-year-old male status post fall he had dislocations to the index and middle finger as well as laceration to the middle finger.  He was preliminarily stabilized with reduction and suturing of the fingers/middle finger in the ER by Dr. Jeneen Rinks.  I was asked to see the patient.  I discussed with the patient his findings and discussed all issues with the family.  He does have a history of falls recently according to his son.  At present time he is examined at length in regards to his hand.  The left hand is the area in question the right hand is nontender.  Past Medical History:  Diagnosis Date  . AAA (abdominal aortic aneurysm) without rupture ([place])   . CVA (cerebral infarction) 03/2014   right PCA CVA  . Diverticulosis   . Diverticulosis   . Dyslipidemia   . GERD (gastroesophageal reflux disease)   . Hearing difficulty   . Hemorrhoids   . Hiatal hernia 1960  . History of colon polyps   . History of prostatitis   . Hx of adenomatous colonic polyps   . Hyperlipidemia   . Lower GI bleed 12/2011   secondary to diverticulosis  . Macular degeneration   . Obesity   . Peripheral neuropathy   . Peripheral vascular disease ([place])   . Premature ventricular contraction   . Rhabdomyolysis 03/2014   Due to prolonged fall  . Senile osteoporosis    as per documented on reclast infusion orders  . Tachycardia-bradycardia ([place])   . Unsteady gait     Past Surgical History:  Procedure Laterality Date  . ABDOMINAL AORTIC ANEURYSM REPAIR    . ABDOMINAL AORTIC ENDOVASCULAR STENT GRAFT N/A 08/23/2013   Procedure: ABDOMINAL AORTIC ENDOVASCULAR STENT GRAFT;  Surgeon: Mal Misty, MD;  Location: [facility];  Service: Vascular;  Laterality: N/A;  . CATARACT EXTRACTION Bilateral 07/2009, 09/2009  . COLONOSCOPY  01/24/2012   Procedure: COLONOSCOPY;  Surgeon: Lafayette Dragon, MD;  Location:  [facility];  Service: Endoscopy;  Laterality: N/A;  . INGUINAL HERNIA REPAIR    . KNEE SURGERY     left knee in 1970's  . TESTICLE SURGERY  1949   Undescended testicle  . TONSILLECTOMY      Family History  Problem Relation Age of Onset  . Heart disease Father 63       Rheumatic heart  . Alcohol abuse Sister     Social History:  reports that he quit smoking about 62 years ago. His smoking use included cigarettes. He quit after 5.00 years of use. He has never used smokeless tobacco. He reports that he drinks about 1.8 oz of alcohol per week. He reports that he does not use drugs.  Allergies:  Allergies  Allergen Reactions  . Sulfamethoxazole Other (See Comments)    UNKNOWN    Medications: I have reviewed the patient's current medications.  Results for orders placed or performed during the hospital encounter of 03/14/18 (from the past 48 hour(s))  CBC with Differential/Platelet     Status: Abnormal   Collection Time: 03/14/18  5:51 PM  Result Value Ref Range   WBC 12.1 (H) 4.0 - 10.5 K/uL   RBC 4.79 4.22 - 5.81 MIL/uL   Hemoglobin 14.3 13.0 - 17.0 g/dL   HCT 43.8 39.0 - 52.0 %   MCV 91.4 78.0 - 100.0 fL  MCH 29.9 26.0 - 34.0 pg   MCHC 32.6 30.0 - 36.0 g/dL   RDW 15.0 11.5 - 15.5 %   Platelets 384 150 - 400 K/uL   Neutrophils Relative % 79 %   Neutro Abs 9.6 (H) 1.7 - 7.7 K/uL   Lymphocytes Relative 14 %   Lymphs Abs 1.7 0.7 - 4.0 K/uL   Monocytes Relative 5 %   Monocytes Absolute 0.6 0.1 - 1.0 K/uL   Eosinophils Relative 1 %   Eosinophils Absolute 0.1 0.0 - 0.7 K/uL   Basophils Relative 0 %   Basophils Absolute 0.0 0.0 - 0.1 K/uL   Immature Granulocytes 1 %   Abs Immature Granulocytes 0.1 0.0 - 0.1 K/uL    Comment: Performed at [facility] [address], [place], [place] [address]  Basic metabolic panel     Status: Abnormal   Collection Time: 03/14/18  5:51 PM  Result Value Ref Range   Sodium 139 135 - 145 mmol/L   Potassium 4.3 3.5 - 5.1 mmol/L    Chloride 105 101 - 111 mmol/L   CO2 24 22 - 32 mmol/L   Glucose, Bld 110 (H) 65 - 99 mg/dL   BUN 12 6 - 20 mg/dL   Creatinine, Ser 0.86 0.61 - 1.24 mg/dL   Calcium 8.5 (L) 8.9 - 10.3 mg/dL   GFR calc non Af Amer >60 >60 mL/min   GFR calc Af Amer >60 >60 mL/min    Comment: (NOTE) The eGFR has been calculated using the CKD EPI equation. This calculation has not been validated in all clinical situations. eGFR's persistently <60 mL/min signify possible Chronic Kidney Disease.    Anion gap 10 5 - 15    Comment: Performed at [facility] [address], [place], [place] [address]    Ct Head Wo Contrast  Result Date: 03/14/2018 CLINICAL DATA:  84 year old male with unwitnessed fall. Initial encounter. EXAM: CT HEAD WITHOUT CONTRAST CT CERVICAL SPINE WITHOUT CONTRAST TECHNIQUE: Multidetector CT imaging of the head and cervical spine was performed following the standard protocol without intravenous contrast. Multiplanar CT image reconstructions of the cervical spine were also generated. COMPARISON:  07/26/2015 head CT and cervical spine CT. 10/17/2017 brain MR. 09/09/2014 chest CT. FINDINGS: CT HEAD FINDINGS Brain: No intracranial hemorrhage or CT evidence of large acute infarct. Remote right occipital lobe infarct. Remote left Corona radiata/lenticular nucleus infarct. Remote right thalamic infarct. Marked chronic microvascular changes. Moderate to marked global atrophy. No intracranial mass lesion noted on this unenhanced exam. Vascular: Vascular calcification Skull: No skull fracture Sinuses/Orbits: No acute orbital abnormality. Visualized paranasal sinuses are clear. Other: Mastoid air cells and middle ear cavities are clear. CT CERVICAL SPINE FINDINGS Alignment: Within normal limits. Skull base and vertebrae: No acute cervical spine fracture. Remote T3 superior endplate compression fracture. Soft tissues and spinal canal: No abnormal prevertebral soft tissue swelling. Disc levels: Cervical  spondylotic changes with spinal stenosis most notable C6-7 followed by C5-6. Upper chest: Biapical pleural scarring. Left-sided pleural effusion. Other: Goiter.  Carotid calcifications. IMPRESSION: No skull fracture or intracranial hemorrhage. No cervical spine fracture, malalignment or abnormal prevertebral soft tissue swelling. Left-sided pleural effusion. Chronic changes otherwise as detailed above. Electronically Signed   By: Genia Del M.D.   On: 03/14/2018 15:54   Ct Cervical Spine Wo Contrast  Result Date: 03/14/2018 CLINICAL DATA:  84 year old male with unwitnessed fall. Initial encounter. EXAM: CT HEAD WITHOUT CONTRAST CT CERVICAL SPINE WITHOUT CONTRAST TECHNIQUE: Multidetector CT imaging  of the head and cervical spine was performed following the standard protocol without intravenous contrast. Multiplanar CT image reconstructions of the cervical spine were also generated. COMPARISON:  07/26/2015 head CT and cervical spine CT. 10/17/2017 brain MR. 09/09/2014 chest CT. FINDINGS: CT HEAD FINDINGS Brain: No intracranial hemorrhage or CT evidence of large acute infarct. Remote right occipital lobe infarct. Remote left Corona radiata/lenticular nucleus infarct. Remote right thalamic infarct. Marked chronic microvascular changes. Moderate to marked global atrophy. No intracranial mass lesion noted on this unenhanced exam. Vascular: Vascular calcification Skull: No skull fracture Sinuses/Orbits: No acute orbital abnormality. Visualized paranasal sinuses are clear. Other: Mastoid air cells and middle ear cavities are clear. CT CERVICAL SPINE FINDINGS Alignment: Within normal limits. Skull base and vertebrae: No acute cervical spine fracture. Remote T3 superior endplate compression fracture. Soft tissues and spinal canal: No abnormal prevertebral soft tissue swelling. Disc levels: Cervical spondylotic changes with spinal stenosis most notable C6-7 followed by C5-6. Upper chest: Biapical pleural scarring.  Left-sided pleural effusion. Other: Goiter.  Carotid calcifications. IMPRESSION: No skull fracture or intracranial hemorrhage. No cervical spine fracture, malalignment or abnormal prevertebral soft tissue swelling. Left-sided pleural effusion. Chronic changes otherwise as detailed above. Electronically Signed   By: Genia Del M.D.   On: 03/14/2018 15:54   Dg Chest Port 1 View  Result Date: 03/14/2018 CLINICAL DATA:  Fall today EXAM: PORTABLE CHEST 1 VIEW COMPARISON:  Chest x-ray dated 08/23/2013. FINDINGS: Stable cardiomegaly. Overall cardiomediastinal silhouette appears stable in size and configuration. Vague opacity at the LEFT lung base, probable small pleural effusion and/or atelectasis. RIGHT lung is clear. No pneumothorax seen. Multiple displaced LEFT-sided rib fractures, most likely the LEFT fifth through ninth ribs. IMPRESSION: 1. Multiple LEFT-sided rib fractures, most likely the LEFT fifth through ninth ribs. 2. Opacity at the LEFT lung base, presumably associated pleural effusion/hemothorax and atelectasis. No pneumothorax seen. Electronically Signed   By: Franki Cabot M.D.   On: 03/14/2018 18:16   Dg Hand Complete Left  Result Date: 03/14/2018 CLINICAL DATA:  Post reduction EXAM: LEFT HAND - COMPLETE 3+ VIEW COMPARISON:  03/14/2018 FINDINGS: Post reduction of second and third PIP joint dislocations. Suboptimal patient positioning for the studies however there appears to be adequate reduction without fracture. Small fractures could be missed on the study due to positioning difficulty Moderate degenerative change base of thumb IMPRESSION: Satisfactory reduction of second and third PIP joints. No fracture identified on this study with suboptimal positioning. Electronically Signed   By: Franchot Gallo M.D.   On: 03/14/2018 17:34   Dg Hand Complete Left  Result Date: 03/14/2018 CLINICAL DATA:  Unwitnessed fall at nursing home. EXAM: LEFT HAND - COMPLETE 3+ VIEW COMPARISON:  None. FINDINGS:  Third proximal interphalangeal joint dislocation with dorsal displacement of the middle phalanx, 8 mm overriding. Second PIP dislocation with 3 mm overriding. No fracture deformity. No destructive bony lesions. Osteopenia. Severe first carpometacarpal joint space narrowing with periarticular sclerosis and marginal spurring. No destructive bony lesions. Soft tissue planes are nonsuspicious. IMPRESSION: Second and third PIP dislocation.  No fracture deformity. Severe first CMC osteoarthrosis. Electronically Signed   By: Elon Alas M.D.   On: 03/14/2018 14:53    ROS Blood pressure (!) 141/99, pulse 80, temperature 97.7 F (36.5 C), temperature source Oral, resp. rate 19, SpO2 95 %. Physical Exam Left hand is evaluated at length.  He has intact PIP flexion per passive exam and active attempts.  There is no evidence of instability or infection.  The sutures  are in place he has bleeding of a minimal nature only around the periphery of the sutured wound.  He has good refill.  I reviewed this with him at length.  The pre-and postreduction x-rays have been reviewed by myself the reduction is satisfactory in my estimation.  I placed him in a modified splint with dorsal and volar type slabs.  This will prevent hyperextension of the PIP joints and any tendency towards redislocation.  I reviewed this with the patient and his son and went over the findings. Assessment/Plan: Status post index and middle finger PIP dislocations with laceration to the middle finger.  Patient is stabilized.  I would recommend elevation and keeping the bandage on for 2 weeks.  Would like to see him back in 2 weeks and arrange a follow-up with therapy to prevent hyperextension and encourage full finger flexion.  Patient his son understand this.  I discussed this with Dr. Jeneen Rinks as well.  We will look forward to seeing him back in the office in 2 weeks with repeat exam.   Willa Frater III 03/14/2018, 6:43 PM

## 2018-03-14 NOTE — ED Notes (Signed)
Report given to [facility].

## 2018-03-14 NOTE — Progress Notes (Signed)
Orthopedic Tech Progress Note Patient Details:  BRYAN GOIN 1926-07-27 [id number]  Ortho Devices Type of Ortho Device: Ace wrap, Volar splint Ortho Device/Splint Location: LUE Ortho Device/Splint Interventions: Ordered, Application   Post Interventions Patient Tolerated: Well Instructions Provided: Care of device   Braulio Bosch 03/14/2018, 6:20 PM

## 2018-03-14 NOTE — ED Notes (Signed)
Pt transported to XR.  

## 2018-03-14 NOTE — ED Notes (Signed)
Please call patients son when he is up for discharge. Samule Dry [phone]

## 2018-03-15 DIAGNOSIS — W19XXXA Unspecified fall, initial encounter: Secondary | ICD-10-CM | POA: Diagnosis not present

## 2018-03-15 DIAGNOSIS — S63259A Unspecified dislocation of unspecified finger, initial encounter: Secondary | ICD-10-CM | POA: Diagnosis not present

## 2018-03-15 DIAGNOSIS — F0281 Dementia in other diseases classified elsewhere with behavioral disturbance: Secondary | ICD-10-CM | POA: Diagnosis not present

## 2018-03-15 DIAGNOSIS — M6281 Muscle weakness (generalized): Secondary | ICD-10-CM | POA: Diagnosis not present

## 2018-03-15 DIAGNOSIS — E669 Obesity, unspecified: Secondary | ICD-10-CM | POA: Diagnosis not present

## 2018-03-15 DIAGNOSIS — R296 Repeated falls: Secondary | ICD-10-CM | POA: Diagnosis not present

## 2018-03-15 DIAGNOSIS — R262 Difficulty in walking, not elsewhere classified: Secondary | ICD-10-CM | POA: Diagnosis not present

## 2018-03-15 DIAGNOSIS — I951 Orthostatic hypotension: Secondary | ICD-10-CM | POA: Diagnosis not present

## 2018-03-15 DIAGNOSIS — I6389 Other cerebral infarction: Secondary | ICD-10-CM | POA: Diagnosis not present

## 2018-03-15 DIAGNOSIS — R2681 Unsteadiness on feet: Secondary | ICD-10-CM | POA: Diagnosis not present

## 2018-03-15 DIAGNOSIS — R2689 Other abnormalities of gait and mobility: Secondary | ICD-10-CM | POA: Diagnosis not present

## 2018-03-15 LAB — URINE CULTURE

## 2018-03-16 DIAGNOSIS — S63283D Dislocation of proximal interphalangeal joint of left middle finger, subsequent encounter: Secondary | ICD-10-CM | POA: Diagnosis not present

## 2018-03-16 DIAGNOSIS — M79642 Pain in left hand: Secondary | ICD-10-CM | POA: Diagnosis not present

## 2018-03-16 DIAGNOSIS — S63281D Dislocation of proximal interphalangeal joint of left index finger, subsequent encounter: Secondary | ICD-10-CM | POA: Diagnosis not present

## 2018-03-19 ENCOUNTER — Emergency Department (HOSPITAL_COMMUNITY): Payer: Medicare Other

## 2018-03-19 ENCOUNTER — Emergency Department (HOSPITAL_COMMUNITY)
Admission: EM | Admit: 2018-03-19 | Discharge: 2018-03-19 | Disposition: A | Discharge status: Home / Self Care | Payer: Medicare Other | Attending: Emergency Medicine | Admitting: Emergency Medicine

## 2018-03-19 DIAGNOSIS — I471 Supraventricular tachycardia: Secondary | ICD-10-CM

## 2018-03-19 DIAGNOSIS — W19XXXA Unspecified fall, initial encounter: Secondary | ICD-10-CM | POA: Diagnosis not present

## 2018-03-19 DIAGNOSIS — R0902 Hypoxemia: Secondary | ICD-10-CM | POA: Diagnosis not present

## 2018-03-19 DIAGNOSIS — M549 Dorsalgia, unspecified: Secondary | ICD-10-CM | POA: Diagnosis not present

## 2018-03-19 DIAGNOSIS — I10 Essential (primary) hypertension: Secondary | ICD-10-CM | POA: Diagnosis not present

## 2018-03-19 DIAGNOSIS — S20219A Contusion of unspecified front wall of thorax, initial encounter: Secondary | ICD-10-CM

## 2018-03-19 DIAGNOSIS — Z79899 Other long term (current) drug therapy: Secondary | ICD-10-CM | POA: Diagnosis not present

## 2018-03-19 DIAGNOSIS — M2550 Pain in unspecified joint: Secondary | ICD-10-CM | POA: Diagnosis not present

## 2018-03-19 DIAGNOSIS — Z87891 Personal history of nicotine dependence: Secondary | ICD-10-CM | POA: Diagnosis not present

## 2018-03-19 DIAGNOSIS — S299XXA Unspecified injury of thorax, initial encounter: Secondary | ICD-10-CM | POA: Diagnosis not present

## 2018-03-19 DIAGNOSIS — M545 Low back pain: Secondary | ICD-10-CM | POA: Diagnosis not present

## 2018-03-19 DIAGNOSIS — S0990XA Unspecified injury of head, initial encounter: Secondary | ICD-10-CM | POA: Diagnosis not present

## 2018-03-19 DIAGNOSIS — R000 Tachycardia, unspecified: Secondary | ICD-10-CM | POA: Diagnosis not present

## 2018-03-19 DIAGNOSIS — S199XXA Unspecified injury of neck, initial encounter: Secondary | ICD-10-CM | POA: Diagnosis not present

## 2018-03-19 DIAGNOSIS — S3993XA Unspecified injury of pelvis, initial encounter: Secondary | ICD-10-CM | POA: Diagnosis not present

## 2018-03-19 DIAGNOSIS — Y999 Unspecified external cause status: Secondary | ICD-10-CM | POA: Insufficient documentation

## 2018-03-19 DIAGNOSIS — Y92129 Unspecified place in nursing home as the place of occurrence of the external cause: Secondary | ICD-10-CM | POA: Diagnosis not present

## 2018-03-19 DIAGNOSIS — I491 Atrial premature depolarization: Secondary | ICD-10-CM | POA: Diagnosis not present

## 2018-03-19 DIAGNOSIS — Y939 Activity, unspecified: Secondary | ICD-10-CM | POA: Diagnosis not present

## 2018-03-19 DIAGNOSIS — F0390 Unspecified dementia without behavioral disturbance: Secondary | ICD-10-CM | POA: Diagnosis not present

## 2018-03-19 DIAGNOSIS — S20212A Contusion of left front wall of thorax, initial encounter: Secondary | ICD-10-CM | POA: Diagnosis not present

## 2018-03-19 DIAGNOSIS — Z7982 Long term (current) use of aspirin: Secondary | ICD-10-CM | POA: Diagnosis not present

## 2018-03-19 DIAGNOSIS — M546 Pain in thoracic spine: Secondary | ICD-10-CM | POA: Diagnosis not present

## 2018-03-19 DIAGNOSIS — I4719 Other supraventricular tachycardia: Secondary | ICD-10-CM

## 2018-03-19 DIAGNOSIS — Z7401 Bed confinement status: Secondary | ICD-10-CM | POA: Diagnosis not present

## 2018-03-19 LAB — CBC WITH DIFFERENTIAL/PLATELET
Abs Immature Granulocytes: 0.1 10*3/uL (ref 0.0–0.1)
Basophils Absolute: 0 10*3/uL (ref 0.0–0.1)
Basophils Relative: 0 %
EOS ABS: 0 10*3/uL (ref 0.0–0.7)
EOS PCT: 0 %
HEMATOCRIT: 45.7 % (ref 39.0–52.0)
HEMOGLOBIN: 14.9 g/dL (ref 13.0–17.0)
IMMATURE GRANULOCYTES: 1 %
LYMPHS ABS: 1.1 10*3/uL (ref 0.7–4.0)
LYMPHS PCT: 9 %
MCH: 30 pg (ref 26.0–34.0)
MCHC: 32.6 g/dL (ref 30.0–36.0)
MCV: 92.1 fL (ref 78.0–100.0)
MONOS PCT: 5 %
Monocytes Absolute: 0.7 10*3/uL (ref 0.1–1.0)
Neutro Abs: 10.5 10*3/uL — ABNORMAL HIGH (ref 1.7–7.7)
Neutrophils Relative %: 85 %
Platelets: 353 10*3/uL (ref 150–400)
RBC: 4.96 MIL/uL (ref 4.22–5.81)
RDW: 14.9 % (ref 11.5–15.5)
WBC: 12.3 10*3/uL — ABNORMAL HIGH (ref 4.0–10.5)

## 2018-03-19 LAB — I-STAT TROPONIN, ED: Troponin i, poc: 0.02 ng/mL (ref 0.00–0.08)

## 2018-03-19 LAB — BASIC METABOLIC PANEL
ANION GAP: 13 (ref 5–15)
BUN: 17 mg/dL (ref 6–20)
CHLORIDE: 105 mmol/L (ref 101–111)
CO2: 19 mmol/L — AB (ref 22–32)
CREATININE: 0.85 mg/dL (ref 0.61–1.24)
Calcium: 8.6 mg/dL — ABNORMAL LOW (ref 8.9–10.3)
GFR calc non Af Amer: >60 mL/min (ref >60)
GLUCOSE: 123 mg/dL — AB (ref 65–99)
Potassium: 4.1 mmol/L (ref 3.5–5.1)
Sodium: 137 mmol/L (ref 135–145)

## 2018-03-19 LAB — PROTIME-INR
INR: 0.96
Prothrombin Time: 12.7 seconds (ref 11.4–15.2)

## 2018-03-19 LAB — BRAIN NATRIURETIC PEPTIDE: B Natriuretic Peptide: 64.5 pg/mL (ref 0.0–100.0)

## 2018-03-19 MED ORDER — METOPROLOL TARTRATE 5 MG/5ML IV SOLN
5.0000 mg | Freq: Once | INTRAVENOUS | Status: AC
  Administered 2018-03-19: 5 mg via INTRAVENOUS
  Filled 2018-03-19: qty 5

## 2018-03-19 NOTE — ED Provider Notes (Signed)
[facility] EMERGENCY DEPARTMENT Provider Note   CSN: [id number] Arrival date & time: 03/19/18  0358     History   Chief Complaint Chief Complaint  Patient presents with  . Fall    HPI Carl Rios is a 84 y.o. male.  Patient presents to the emergency department for evaluation after a fall.  Fall was at skilled nursing facility, unwitnessed.  Patient was found lying on the ground.  He reportedly complained of back pain, but has chronic back pain.  EMS report that during transport he has had several rate and rhythm changes.  They captured a narrow complex tachycardia that is regular around 150 bpm that spontaneously resolved.  At arrival to the ER he is in a sinus rhythm with frequent ectopy.     Past Medical History:  Diagnosis Date  . AAA (abdominal aortic aneurysm) without rupture ([place])   . CVA (cerebral infarction) 03/2014   right PCA CVA  . Diverticulosis   . Diverticulosis   . Dyslipidemia   . GERD (gastroesophageal reflux disease)   . Hearing difficulty   . Hemorrhoids   . Hiatal hernia 1960  . History of colon polyps   . History of prostatitis   . Hx of adenomatous colonic polyps   . Hyperlipidemia   . Lower GI bleed 12/2011   secondary to diverticulosis  . Macular degeneration   . Obesity   . Peripheral neuropathy   . Peripheral vascular disease ([place])   . Premature ventricular contraction   . Rhabdomyolysis 03/2014   Due to prolonged fall  . Senile osteoporosis    as per documented on reclast infusion orders  . Tachycardia-bradycardia ([place])   . Unsteady gait     Patient Active Problem List   Diagnosis Date Noted  . Paroxysmal atrial tachycardia ([place])   . Orthostasis 10/16/2017  . Gait difficulty 10/16/2017  . History of CVA (cerebrovascular accident) 10/16/2017  . Pressure injury of skin 09/09/2017  . Syncope and collapse 09/08/2017  . PVC's (premature ventricular contractions) 09/23/2014  . Bradycardia 09/23/2014  .  Depression 04/17/2014  . Recurrent falls 04/01/2014  . Paroxysmal SVT (supraventricular tachycardia) ([place]) 04/01/2014  . Unsteady gait 04/01/2014  . CVA (cerebral infarction) 03/31/2014  . Acute renal failure ([place]) 03/27/2014  . Dehydration 03/27/2014  . Rhabdomyolysis 03/26/2014  . AAA (abdominal aortic aneurysm) without rupture ([place]) 08/14/2013  . Abnormality of gait 01/08/2013  . Other general symptoms(780.99) 01/08/2013  . Other vitamin B12 deficiency anemia 01/08/2013  . Polyneuropathy in other diseases classified elsewhere ([place]) 01/08/2013  . Abdominal aneurysm without mention of rupture 02/01/2012  . Hypokalemia 01/25/2012  . Anemia due to acute blood loss 01/24/2012  . Benign neoplasm of colon 01/24/2012  . Hypertension 01/19/2012  . Other and unspecified hyperlipidemia 01/18/2012  . Impaired fasting glucose 01/18/2012  . Unspecified hereditary and idiopathic peripheral neuropathy 01/18/2012  . Hypertonicity of bladder 01/18/2012  . Diverticulosis 01/18/2012  . Rectal bleeding 01/18/2012  . GERD 02/09/2010  . COLONIC POLYPS, ADENOMATOUS, HX OF 02/09/2010    Past Surgical History:  Procedure Laterality Date  . ABDOMINAL AORTIC ANEURYSM REPAIR    . ABDOMINAL AORTIC ENDOVASCULAR STENT GRAFT N/A 08/23/2013   Procedure: ABDOMINAL AORTIC ENDOVASCULAR STENT GRAFT;  Surgeon: Mal Misty, MD;  Location: [facility];  Service: Vascular;  Laterality: N/A;  . CATARACT EXTRACTION Bilateral 07/2009, 09/2009  . COLONOSCOPY  01/24/2012   Procedure: COLONOSCOPY;  Surgeon: Lafayette Dragon, MD;  Location: [facility] ENDOSCOPY;  Service:  Endoscopy;  Laterality: N/A;  . INGUINAL HERNIA REPAIR    . KNEE SURGERY     left knee in 1970's  . TESTICLE SURGERY  1949   Undescended testicle  . TONSILLECTOMY          Home Medications    Prior to Admission medications   Medication Sig Start Date End Date Taking? Authorizing Provider  acetaminophen (TYLENOL) 325 MG tablet Take 650 mg by mouth every 6  (six) hours as needed.   Yes [provider]  aspirin EC 81 MG EC tablet Take 1 tablet (81 mg total) by mouth daily. 03/29/14  Yes Marton Redwood, MD  cephALEXin (KEFLEX) 500 MG capsule Take 1 capsule (500 mg total) by mouth 3 (three) times daily. 03/14/18  Yes Tanna Furry, MD  DULoxetine (CYMBALTA) 30 MG capsule Take 1 capsule (30 mg total) by mouth daily. 02/19/14  Yes Kathrynn Ducking, MD  HYDROcodone-acetaminophen (NORCO/VICODIN) 5-325 MG tablet Take 1 tablet by mouth every 6 (six) hours as needed for moderate pain.   Yes [provider]  loratadine (CLARITIN) 10 MG tablet Take 10 mg by mouth daily.   Yes [provider]  metoprolol tartrate (LOPRESSOR) 25 MG tablet Take 12.5 mg by mouth 2 (two) times daily.  05/22/14  Yes [provider]  midodrine (PROAMATINE) 2.5 MG tablet Take 1 tablet (2.5 mg total) 2 (two) times daily with a meal by mouth. 09/09/17  Yes Georgette Shell, MD  Multiple Vitamins-Minerals (MACULAR VITAMIN BENEFIT) TABS Take by mouth.   Yes [provider]  polyethylene glycol (MIRALAX / GLYCOLAX) packet Take 17 g daily as needed by mouth for mild constipation. 09/09/17  Yes Georgette Shell, MD  senna (SENOKOT) 8.6 MG TABS tablet Take 1 tablet (8.6 mg total) 2 (two) times daily by mouth. 09/09/17  Yes Georgette Shell, MD  tamsulosin (FLOMAX) 0.4 MG CAPS capsule Take 0.4 mg daily with supper by mouth. 08/01/17  Yes [provider]  Vitamin D, Ergocalciferol, (DRISDOL) 50000 units CAPS capsule Take 50,000 Units by mouth every 7 (seven) days.   Yes [provider]    Family History Family History  Problem Relation Age of Onset  . Heart disease Father 64       Rheumatic heart  . Alcohol abuse Sister     Social History Social History   Tobacco Use  . Smoking status: Former Smoker    Years: 5.00    Types: Cigarettes    Last attempt to quit: 11/02/1955    Years since quitting: 62.4  . Smokeless tobacco:  Never Used  Substance Use Topics  . Alcohol use: Yes    Alcohol/week: 1.8 oz    Types: 3 Standard drinks or equivalent per week    Comment: 3 drinks per week   (QUIT X 6 MONTHS)  . Drug use: No     Allergies   Sulfamethoxazole   Review of Systems Review of Systems  Unable to perform ROS: Dementia     Physical Exam Updated Vital Signs BP 110/84   Pulse 91   Temp (!) 96.5 F (35.8 C) (Temporal)   Resp (!) 22   SpO2 95%   Physical Exam  Constitutional: He is oriented to person, place, and time. He appears well-developed and well-nourished. No distress.  HENT:  Head: Normocephalic and atraumatic.  Right Ear: Hearing normal.  Left Ear: Hearing normal.  Nose: Nose normal.  Mouth/Throat: Oropharynx is clear and moist and mucous membranes are normal.  Eyes: Pupils are equal, round, and reactive to light. Conjunctivae and EOM are normal.  Neck: Normal range of motion. Neck supple.  Cardiovascular: Regular rhythm, S1 normal and S2 normal. Frequent extrasystoles are present. Tachycardia present. Exam reveals no gallop and no friction rub.  No murmur heard. Pulmonary/Chest: Effort normal and breath sounds normal. No respiratory distress. He exhibits tenderness. He exhibits no crepitus.    Abdominal: Soft. Normal appearance and bowel sounds are normal. There is no hepatosplenomegaly. There is no tenderness. There is no rebound, no guarding, no tenderness at McBurney's point and negative Murphy's sign. No hernia.  Musculoskeletal: Normal range of motion.  Generalized paraspinal tenderness  Neurological: He is alert and oriented to person, place, and time. He has normal strength. No cranial nerve deficit or sensory deficit. Coordination normal. GCS eye subscore is 4. GCS verbal subscore is 5. GCS motor subscore is 6.  Skin: Skin is warm, dry and intact. No rash noted. No cyanosis.  Psychiatric: He has a normal mood and affect. His speech is normal and behavior is normal. Thought  content normal.  Nursing note and vitals reviewed.    ED Treatments / Results  Labs (all labs ordered are listed, but only abnormal results are displayed) Labs Reviewed  CBC WITH DIFFERENTIAL/PLATELET - Abnormal; Notable for the following components:      Result Value   WBC 12.3 (*)    Neutro Abs 10.5 (*)    All other components within normal limits  BASIC METABOLIC PANEL - Abnormal; Notable for the following components:   CO2 19 (*)    Glucose, Bld 123 (*)    Calcium 8.6 (*)    All other components within normal limits  BRAIN NATRIURETIC PEPTIDE  PROTIME-INR  I-STAT TROPONIN, ED    EKG EKG Interpretation  Date/Time:  Sunday Mar 19 2018 04:05:51 EDT Ventricular Rate:  108 PR Interval:    QRS Duration: 87 QT Interval:  346 QTC Calculation: 464 R Axis:   -50 Text Interpretation:  Sinus tachycardia Paired ventricular premature complexes, pacs Abnormal R-wave progression, late transition Inferior infarct, old Confirmed by Orpah Greek [phone]) on 03/19/2018 4:16:23 AM   Radiology Dg Chest 2 View  Result Date: 03/19/2018 CLINICAL DATA:  Status post unwitnessed fall, with concern for chest injury. Initial encounter. EXAM: CHEST - 2 VIEW COMPARISON:  Chest radiograph performed 03/14/2018 FINDINGS: The lungs are well-aerated. Vascular congestion is noted. Mildly increased interstitial markings on the left could reflect mild transient interstitial edema. There is no evidence of pleural effusion or pneumothorax. The heart is normal in size; the mediastinal contour is within normal limits. Left-sided rib fractures are again noted. Scattered soft tissue air is noted along the left chest wall. IMPRESSION: 1. Vascular congestion noted. Mildly increased interstitial markings on the left could reflect mild transient interstitial edema. 2. Left-sided rib fractures again noted, as on the study from May 14. Electronically Signed   By: Garald Balding M.D.   On: 03/19/2018 05:27   Dg  Sternum  Result Date: 03/19/2018 CLINICAL DATA:  Status post unwitnessed fall, with concern for sternal injury. Initial encounter. EXAM: STERNUM - 2+ VIEW COMPARISON:  CT of the chest performed 09/09/2014 FINDINGS: There is no evidence of fracture or dislocation. The sternum appears grossly intact. The visualized portions of the lungs are grossly clear. IMPRESSION: No evidence of fracture or dislocation. Electronically Signed   By: Garald Balding M.D.   On: 03/19/2018 05:28   Dg Thoracic Spine 2 View  Result  Date: 03/19/2018 CLINICAL DATA:  Status post unwitnessed fall, with upper back pain. Initial encounter. EXAM: THORACIC SPINE 2 VIEWS COMPARISON:  CT of the chest performed 09/09/2017 FINDINGS: There is no evidence of acute fracture or subluxation. Vertebral bodies demonstrate normal alignment. Intervertebral disc spaces are preserved. Chronic compression deformities are again noted at the mid and lower thoracic spine. The visualized portions of both lungs are clear. The mediastinum is unremarkable in appearance. IMPRESSION: No evidence of acute fracture or subluxation along the thoracic spine. Chronic compression deformities again noted at the mid and lower thoracic spine. Electronically Signed   By: Garald Balding M.D.   On: 03/19/2018 05:29   Dg Lumbar Spine Complete  Result Date: 03/19/2018 CLINICAL DATA:  Status post unwitnessed fall, with acute on chronic lower back pain. Initial encounter. EXAM: LUMBAR SPINE - COMPLETE 4+ VIEW COMPARISON:  Lumbar spine radiographs performed 07/26/2015 FINDINGS: There is no evidence of fracture or subluxation. There is mild chronic loss of height at vertebral bodies T11, T12, L2 and L3. Vertebral bodies demonstrate normal alignment. Intervertebral disc spaces are preserved. Mild facet disease is noted at the lower lumbar spine. Lateral osteophytes are noted along the lower thoracic and upper lumbar spine. The visualized bowel gas pattern is unremarkable in  appearance; air and stool are noted within the colon. The sacroiliac joints are within normal limits. An aortoiliac stent graft is noted. IMPRESSION: 1. No evidence of acute fracture or subluxation along the lumbar spine. 2. Mild chronic loss of height at vertebral bodies T11, T12, L2 and L3. Electronically Signed   By: Garald Balding M.D.   On: 03/19/2018 05:31   Dg Pelvis 1-2 Views  Result Date: 03/19/2018 CLINICAL DATA:  Status post unwitnessed fall, with concern for pelvic injury. Initial encounter. EXAM: PELVIS - 1-2 VIEW COMPARISON:  Pelvic radiograph performed 03/26/2014 FINDINGS: There is no evidence of fracture or dislocation. Both femoral heads are seated normally within their respective acetabula. No significant degenerative change is appreciated. Mild sclerosis is noted at the sacroiliac joints. The visualized bowel gas pattern is grossly unremarkable in appearance. An aortoiliac stent graft is noted. IMPRESSION: No evidence of fracture or dislocation. Electronically Signed   By: Garald Balding M.D.   On: 03/19/2018 05:31   Ct Head Wo Contrast  Result Date: 03/19/2018 CLINICAL DATA:  Status post unwitnessed fall, with concern for head or cervical spine injury. Initial encounter. EXAM: CT HEAD WITHOUT CONTRAST CT CERVICAL SPINE WITHOUT CONTRAST TECHNIQUE: Multidetector CT imaging of the head and cervical spine was performed following the standard protocol without intravenous contrast. Multiplanar CT image reconstructions of the cervical spine were also generated. COMPARISON:  CT of the head and cervical spine performed 03/14/2018, and MRI of the brain performed 10/17/2017 FINDINGS: CT HEAD FINDINGS Brain: No evidence of acute infarction, hemorrhage, hydrocephalus, extra-axial collection or mass lesion / mass effect. Prominence of the ventricles and sulci reflects moderately severe cortical volume loss. Cerebellar atrophy is noted. Diffuse periventricular and subcortical white matter change likely  reflects small vessel ischemic microangiopathy. Chronic lacunar infarcts are noted at the corona radiata bilaterally. The brainstem and fourth ventricle are within normal limits. The basal ganglia are unremarkable in appearance. The cerebral hemispheres demonstrate grossly normal gray-white differentiation. No mass effect or midline shift is seen. Vascular: No hyperdense vessel or unexpected calcification. Skull: There is no evidence of fracture; visualized osseous structures are unremarkable in appearance. Sinuses/Orbits: The orbits are within normal limits. The paranasal sinuses and mastoid air cells are well-aerated.  Other: No significant soft tissue abnormalities are seen. CT CERVICAL SPINE FINDINGS Alignment: Normal. Skull base and vertebrae: No acute fracture. No primary bone lesion or focal pathologic process. Soft tissues and spinal canal: No prevertebral fluid or swelling. No visible canal hematoma. Disc levels: Multilevel disc space narrowing is noted along the cervical spine, with underlying facet disease. Scattered anterior and posterior disc osteophyte complexes are noted along the cervical spine. Upper chest: Mild scarring and atelectasis are noted at the lung apices, with a trace left pleural effusion. Scattered calcifications are noted at the thyroid gland, with mild nonspecific heterogeneity noted bilaterally. There is mild calcification at the carotid bifurcations bilaterally. Other: No additional soft tissue abnormalities are seen. IMPRESSION: 1. No evidence of traumatic intracranial injury or fracture. 2. No evidence of fracture or subluxation along the cervical spine. 3. Moderately severe cortical volume loss and diffuse small vessel ischemic microangiopathy. 4. Chronic lacunar infarcts at the corona radiata bilaterally. 5. Mild degenerative change along the cervical spine. 6. Mild scarring and atelectasis at the lung apices, with a trace left pleural effusion. 7. Mild calcification at the  carotid bifurcations bilaterally. Electronically Signed   By: Garald Balding M.D.   On: 03/19/2018 05:23   Ct Cervical Spine Wo Contrast  Result Date: 03/19/2018 CLINICAL DATA:  Status post unwitnessed fall, with concern for head or cervical spine injury. Initial encounter. EXAM: CT HEAD WITHOUT CONTRAST CT CERVICAL SPINE WITHOUT CONTRAST TECHNIQUE: Multidetector CT imaging of the head and cervical spine was performed following the standard protocol without intravenous contrast. Multiplanar CT image reconstructions of the cervical spine were also generated. COMPARISON:  CT of the head and cervical spine performed 03/14/2018, and MRI of the brain performed 10/17/2017 FINDINGS: CT HEAD FINDINGS Brain: No evidence of acute infarction, hemorrhage, hydrocephalus, extra-axial collection or mass lesion / mass effect. Prominence of the ventricles and sulci reflects moderately severe cortical volume loss. Cerebellar atrophy is noted. Diffuse periventricular and subcortical white matter change likely reflects small vessel ischemic microangiopathy. Chronic lacunar infarcts are noted at the corona radiata bilaterally. The brainstem and fourth ventricle are within normal limits. The basal ganglia are unremarkable in appearance. The cerebral hemispheres demonstrate grossly normal gray-white differentiation. No mass effect or midline shift is seen. Vascular: No hyperdense vessel or unexpected calcification. Skull: There is no evidence of fracture; visualized osseous structures are unremarkable in appearance. Sinuses/Orbits: The orbits are within normal limits. The paranasal sinuses and mastoid air cells are well-aerated. Other: No significant soft tissue abnormalities are seen. CT CERVICAL SPINE FINDINGS Alignment: Normal. Skull base and vertebrae: No acute fracture. No primary bone lesion or focal pathologic process. Soft tissues and spinal canal: No prevertebral fluid or swelling. No visible canal hematoma. Disc levels:  Multilevel disc space narrowing is noted along the cervical spine, with underlying facet disease. Scattered anterior and posterior disc osteophyte complexes are noted along the cervical spine. Upper chest: Mild scarring and atelectasis are noted at the lung apices, with a trace left pleural effusion. Scattered calcifications are noted at the thyroid gland, with mild nonspecific heterogeneity noted bilaterally. There is mild calcification at the carotid bifurcations bilaterally. Other: No additional soft tissue abnormalities are seen. IMPRESSION: 1. No evidence of traumatic intracranial injury or fracture. 2. No evidence of fracture or subluxation along the cervical spine. 3. Moderately severe cortical volume loss and diffuse small vessel ischemic microangiopathy. 4. Chronic lacunar infarcts at the corona radiata bilaterally. 5. Mild degenerative change along the cervical spine. 6. Mild scarring and atelectasis  at the lung apices, with a trace left pleural effusion. 7. Mild calcification at the carotid bifurcations bilaterally. Electronically Signed   By: Garald Balding M.D.   On: 03/19/2018 05:23    Procedures Procedures (including critical care time)  Medications Ordered in ED Medications  metoprolol tartrate (LOPRESSOR) injection 5 mg (5 mg Intravenous Given 03/19/18 0519)     Initial Impression / Assessment and Plan / ED Course  I have reviewed the triage vital signs and the nursing notes.  Pertinent labs & imaging results that were available during my care of the patient were reviewed by me and considered in my medical decision making (see chart for details).     She presented to the ER after a unwitnessed fall.  He had complaints of back pain, but this is a chronic complaint for him.  Thoracic spine, lumbar spine unremarkable on x-ray.  Patient had head CT and cervical spine CT performed, no acute injury.  He is not expensing any lower extremity pain, has normal range of motion of both hips.   Pelvic x-ray negative.  He did have some bruising on the right chest wall and central lower sternum region.  This is very mild.  No crepitance.  X-rays negative.  Patient noted to have a brief period of tachycardia for EMS.  This reoccurred here in the ER as well.  I reviewed his records and during his last hospitalization he did have a similar episode.  Cardiology reviewed this and felt this was a paroxysmal atrial tachycardia, treated with Lopressor.  Patient given a dose of IV Lopressor here in the ER and he has not had any recurrence of his tachycardia.  Recommend continued low-dose Lopressor as an outpatient, follow-up as needed.  Final Clinical Impressions(s) / ED Diagnoses   Final diagnoses:  Fall  Fall, initial encounter  Contusion of front wall of thorax, unspecified laterality, initial encounter  Paroxysmal atrial tachycardia [facility])    ED Discharge Orders    None       Orpah Greek, MD 03/19/18 [phone]

## 2018-03-19 NOTE — ED Notes (Signed)
Patient transported to X-ray 

## 2018-03-19 NOTE — ED Notes (Signed)
Attempted to call report to [facility]. No answer and no ability to leave a message

## 2018-03-19 NOTE — ED Triage Notes (Signed)
Pt BIB [facility] from [facility] for an unwitnessed fall. Pt was found supine c/o of back pain which is chronic. Presents in a c spine immbolization. Pt is not anticoagulated and has not complaints. A+Ox2 at baseline

## 2018-03-20 DIAGNOSIS — G609 Hereditary and idiopathic neuropathy, unspecified: Secondary | ICD-10-CM | POA: Diagnosis not present

## 2018-03-20 DIAGNOSIS — S63273D Dislocation of unspecified interphalangeal joint of left middle finger, subsequent encounter: Secondary | ICD-10-CM | POA: Diagnosis not present

## 2018-03-20 DIAGNOSIS — S2242XS Multiple fractures of ribs, left side, sequela: Secondary | ICD-10-CM | POA: Diagnosis not present

## 2018-03-20 DIAGNOSIS — F0281 Dementia in other diseases classified elsewhere with behavioral disturbance: Secondary | ICD-10-CM | POA: Diagnosis not present

## 2018-03-20 DIAGNOSIS — S2242XD Multiple fractures of ribs, left side, subsequent encounter for fracture with routine healing: Secondary | ICD-10-CM | POA: Diagnosis not present

## 2018-03-20 DIAGNOSIS — I951 Orthostatic hypotension: Secondary | ICD-10-CM | POA: Diagnosis not present

## 2018-03-20 DIAGNOSIS — I119 Hypertensive heart disease without heart failure: Secondary | ICD-10-CM | POA: Diagnosis not present

## 2018-03-20 DIAGNOSIS — R2689 Other abnormalities of gait and mobility: Secondary | ICD-10-CM | POA: Diagnosis not present

## 2018-03-20 DIAGNOSIS — S63271D Dislocation of unspecified interphalangeal joint of left index finger, subsequent encounter: Secondary | ICD-10-CM | POA: Diagnosis not present

## 2018-03-20 DIAGNOSIS — I471 Supraventricular tachycardia: Secondary | ICD-10-CM | POA: Diagnosis not present

## 2018-03-20 NOTE — ED Provider Notes (Signed)
[facility] EMERGENCY DEPARTMENT Provider Note   CSN: [id number] Arrival date & time: 03/14/18  1358     History   Chief Complaint Chief Complaint  Patient presents with  . Fall    HPI Carl Rios is a 84 y.o. male.  If complaint is fall, finger injury  HPI: Carl Rios is a 84 year old male with a history of dementia.  He resides at [facility].  He had a fall 2 weeks ago per his son's report and his son states "they thought he had some broken ribs".  He has done well.  However he fell again today and has left hand finger injuries.  Resides in independent living per his son.  Past Medical History:  Diagnosis Date  . AAA (abdominal aortic aneurysm) without rupture ([place])   . CVA (cerebral infarction) 03/2014   right PCA CVA  . Diverticulosis   . Diverticulosis   . Dyslipidemia   . GERD (gastroesophageal reflux disease)   . Hearing difficulty   . Hemorrhoids   . Hiatal hernia 1960  . History of colon polyps   . History of prostatitis   . Hx of adenomatous colonic polyps   . Hyperlipidemia   . Lower GI bleed 12/2011   secondary to diverticulosis  . Macular degeneration   . Obesity   . Peripheral neuropathy   . Peripheral vascular disease ([place])   . Premature ventricular contraction   . Rhabdomyolysis 03/2014   Due to prolonged fall  . Senile osteoporosis    as per documented on reclast infusion orders  . Tachycardia-bradycardia ([place])   . Unsteady gait     Patient Active Problem List   Diagnosis Date Noted  . Paroxysmal atrial tachycardia ([place])   . Orthostasis 10/16/2017  . Gait difficulty 10/16/2017  . History of CVA (cerebrovascular accident) 10/16/2017  . Pressure injury of skin 09/09/2017  . Syncope and collapse 09/08/2017  . PVC's (premature ventricular contractions) 09/23/2014  . Bradycardia 09/23/2014  . Depression 04/17/2014  . Recurrent falls 04/01/2014  . Paroxysmal SVT (supraventricular tachycardia) ([place]) 04/01/2014  .  Unsteady gait 04/01/2014  . CVA (cerebral infarction) 03/31/2014  . Acute renal failure ([place]) 03/27/2014  . Dehydration 03/27/2014  . Rhabdomyolysis 03/26/2014  . AAA (abdominal aortic aneurysm) without rupture ([place]) 08/14/2013  . Abnormality of gait 01/08/2013  . Other general symptoms(780.99) 01/08/2013  . Other vitamin B12 deficiency anemia 01/08/2013  . Polyneuropathy in other diseases classified elsewhere ([place]) 01/08/2013  . Abdominal aneurysm without mention of rupture 02/01/2012  . Hypokalemia 01/25/2012  . Anemia due to acute blood loss 01/24/2012  . Benign neoplasm of colon 01/24/2012  . Hypertension 01/19/2012  . Other and unspecified hyperlipidemia 01/18/2012  . Impaired fasting glucose 01/18/2012  . Unspecified hereditary and idiopathic peripheral neuropathy 01/18/2012  . Hypertonicity of bladder 01/18/2012  . Diverticulosis 01/18/2012  . Rectal bleeding 01/18/2012  . GERD 02/09/2010  . COLONIC POLYPS, ADENOMATOUS, HX OF 02/09/2010    Past Surgical History:  Procedure Laterality Date  . ABDOMINAL AORTIC ANEURYSM REPAIR    . ABDOMINAL AORTIC ENDOVASCULAR STENT GRAFT N/A 08/23/2013   Procedure: ABDOMINAL AORTIC ENDOVASCULAR STENT GRAFT;  Surgeon: Mal Misty, MD;  Location: [facility];  Service: Vascular;  Laterality: N/A;  . CATARACT EXTRACTION Bilateral 07/2009, 09/2009  . COLONOSCOPY  01/24/2012   Procedure: COLONOSCOPY;  Surgeon: Lafayette Dragon, MD;  Location: [facility];  Service: Endoscopy;  Laterality: N/A;  . INGUINAL HERNIA REPAIR    .  KNEE SURGERY     left knee in 1970's  . TESTICLE SURGERY  1949   Undescended testicle  . TONSILLECTOMY          Home Medications    Prior to Admission medications   Medication Sig Start Date End Date Taking? Authorizing Provider  acetaminophen (TYLENOL) 325 MG tablet Take 650 mg by mouth every 6 (six) hours as needed.   Yes [provider]  aspirin EC 81 MG EC tablet Take 1 tablet (81 mg total) by mouth daily.  03/29/14  Yes Marton Redwood, MD  DULoxetine (CYMBALTA) 30 MG capsule Take 1 capsule (30 mg total) by mouth daily. 02/19/14  Yes Kathrynn Ducking, MD  HYDROcodone-acetaminophen (NORCO/VICODIN) 5-325 MG tablet Take 1 tablet by mouth every 6 (six) hours as needed for moderate pain.   Yes [provider]  loratadine (CLARITIN) 10 MG tablet Take 10 mg by mouth daily.   Yes [provider]  metoprolol tartrate (LOPRESSOR) 25 MG tablet Take 12.5 mg by mouth 2 (two) times daily.  05/22/14  Yes [provider]  midodrine (PROAMATINE) 2.5 MG tablet Take 1 tablet (2.5 mg total) 2 (two) times daily with a meal by mouth. 09/09/17  Yes Georgette Shell, MD  Multiple Vitamins-Minerals (MACULAR VITAMIN BENEFIT) TABS Take by mouth.   Yes [provider]  senna (SENOKOT) 8.6 MG TABS tablet Take 1 tablet (8.6 mg total) 2 (two) times daily by mouth. 09/09/17  Yes Georgette Shell, MD  tamsulosin (FLOMAX) 0.4 MG CAPS capsule Take 0.4 mg daily with supper by mouth. 08/01/17  Yes [provider]  Vitamin D, Ergocalciferol, (DRISDOL) 50000 units CAPS capsule Take 50,000 Units by mouth every 7 (seven) days.   Yes [provider]  cephALEXin (KEFLEX) 500 MG capsule Take 1 capsule (500 mg total) by mouth 3 (three) times daily. 03/14/18   Tanna Furry, MD  polyethylene glycol [facility] Floria Raveling) packet Take 17 g daily as needed by mouth for mild constipation. 09/09/17   Georgette Shell, MD    Family History Family History  Problem Relation Age of Onset  . Heart disease Father 18       Rheumatic heart  . Alcohol abuse Sister     Social History Social History   Tobacco Use  . Smoking status: Former Smoker    Years: 5.00    Types: Cigarettes    Last attempt to quit: 11/02/1955    Years since quitting: 62.4  . Smokeless tobacco: Never Used  Substance Use Topics  . Alcohol use: Yes    Alcohol/week: 1.8 oz    Types: 3 Standard drinks or equivalent per week      Comment: 3 drinks per week   (QUIT X 6 MONTHS)  . Drug use: No     Allergies   Sulfamethoxazole   Review of Systems Review of Systems  Unable to perform ROS: Dementia     Physical Exam Updated Vital Signs BP (!) 150/85   Pulse 93   Temp 97.7 F (36.5 C) (Oral)   Resp (!) 24   SpO2 90%   Physical Exam  Constitutional: He appears well-developed and well-nourished. No distress.  HENT:  Head: Normocephalic.  Eyes: Pupils are equal, round, and reactive to light. Conjunctivae are normal. No scleral icterus.  Neck: Normal range of motion. Neck supple. No thyromegaly present.  Cardiovascular: Normal rate and regular rhythm. Exam reveals no gallop and no friction rub.  No murmur heard. Pulmonary/Chest: Effort  normal and breath sounds normal. No respiratory distress. He has no wheezes. He has no rales.  No crepitus or report of pain in the left chest despite known rib fractures.  No increased work of breathing.  Abdominal: Soft. Bowel sounds are normal. He exhibits no distension. There is no tenderness. There is no rebound.  Musculoskeletal: Normal range of motion.  Apparent deformity and dislocation of left hand index, and ring finger.  He has laceration volar on middle finger with exposure of his flexor tendons.  Neurological: He is alert.  Skin: Skin is warm and dry. No rash noted.  Psychiatric: He has a normal mood and affect. His behavior is normal.     ED Treatments / Results  Labs (all labs ordered are listed, but only abnormal results are displayed) Labs Reviewed  URINE CULTURE - Abnormal; Notable for the following components:      Result Value   Culture MULTIPLE SPECIES PRESENT, SUGGEST RECOLLECTION (*)    All other components within normal limits  CBC WITH DIFFERENTIAL/PLATELET - Abnormal; Notable for the following components:   WBC 12.1 (*)    Neutro Abs 9.6 (*)    All other components within normal limits  BASIC METABOLIC PANEL - Abnormal; Notable for the  following components:   Glucose, Bld 110 (*)    Calcium 8.5 (*)    All other components within normal limits  URINALYSIS, ROUTINE W REFLEX MICROSCOPIC - Abnormal; Notable for the following components:   Ketones, ur 5 (*)    All other components within normal limits    EKG None  Radiology Dg Chest 2 View  Result Date: 03/19/2018 CLINICAL DATA:  Status post unwitnessed fall, with concern for chest injury. Initial encounter. EXAM: CHEST - 2 VIEW COMPARISON:  Chest radiograph performed 03/14/2018 FINDINGS: The lungs are well-aerated. Vascular congestion is noted. Mildly increased interstitial markings on the left could reflect mild transient interstitial edema. There is no evidence of pleural effusion or pneumothorax. The heart is normal in size; the mediastinal contour is within normal limits. Left-sided rib fractures are again noted. Scattered soft tissue air is noted along the left chest wall. IMPRESSION: 1. Vascular congestion noted. Mildly increased interstitial markings on the left could reflect mild transient interstitial edema. 2. Left-sided rib fractures again noted, as on the study from May 14. Electronically Signed   By: Garald Balding M.D.   On: 03/19/2018 05:27   Dg Sternum  Result Date: 03/19/2018 CLINICAL DATA:  Status post unwitnessed fall, with concern for sternal injury. Initial encounter. EXAM: STERNUM - 2+ VIEW COMPARISON:  CT of the chest performed 09/09/2014 FINDINGS: There is no evidence of fracture or dislocation. The sternum appears grossly intact. The visualized portions of the lungs are grossly clear. IMPRESSION: No evidence of fracture or dislocation. Electronically Signed   By: Garald Balding M.D.   On: 03/19/2018 05:28   Dg Thoracic Spine 2 View  Result Date: 03/19/2018 CLINICAL DATA:  Status post unwitnessed fall, with upper back pain. Initial encounter. EXAM: THORACIC SPINE 2 VIEWS COMPARISON:  CT of the chest performed 09/09/2017 FINDINGS: There is no evidence of  acute fracture or subluxation. Vertebral bodies demonstrate normal alignment. Intervertebral disc spaces are preserved. Chronic compression deformities are again noted at the mid and lower thoracic spine. The visualized portions of both lungs are clear. The mediastinum is unremarkable in appearance. IMPRESSION: No evidence of acute fracture or subluxation along the thoracic spine. Chronic compression deformities again noted at the mid and lower thoracic  spine. Electronically Signed   By: Garald Balding M.D.   On: 03/19/2018 05:29   Dg Lumbar Spine Complete  Result Date: 03/19/2018 CLINICAL DATA:  Status post unwitnessed fall, with acute on chronic lower back pain. Initial encounter. EXAM: LUMBAR SPINE - COMPLETE 4+ VIEW COMPARISON:  Lumbar spine radiographs performed 07/26/2015 FINDINGS: There is no evidence of fracture or subluxation. There is mild chronic loss of height at vertebral bodies T11, T12, L2 and L3. Vertebral bodies demonstrate normal alignment. Intervertebral disc spaces are preserved. Mild facet disease is noted at the lower lumbar spine. Lateral osteophytes are noted along the lower thoracic and upper lumbar spine. The visualized bowel gas pattern is unremarkable in appearance; air and stool are noted within the colon. The sacroiliac joints are within normal limits. An aortoiliac stent graft is noted. IMPRESSION: 1. No evidence of acute fracture or subluxation along the lumbar spine. 2. Mild chronic loss of height at vertebral bodies T11, T12, L2 and L3. Electronically Signed   By: Garald Balding M.D.   On: 03/19/2018 05:31   Dg Pelvis 1-2 Views  Result Date: 03/19/2018 CLINICAL DATA:  Status post unwitnessed fall, with concern for pelvic injury. Initial encounter. EXAM: PELVIS - 1-2 VIEW COMPARISON:  Pelvic radiograph performed 03/26/2014 FINDINGS: There is no evidence of fracture or dislocation. Both femoral heads are seated normally within their respective acetabula. No significant  degenerative change is appreciated. Mild sclerosis is noted at the sacroiliac joints. The visualized bowel gas pattern is grossly unremarkable in appearance. An aortoiliac stent graft is noted. IMPRESSION: No evidence of fracture or dislocation. Electronically Signed   By: Garald Balding M.D.   On: 03/19/2018 05:31   Ct Head Wo Contrast  Result Date: 03/19/2018 CLINICAL DATA:  Status post unwitnessed fall, with concern for head or cervical spine injury. Initial encounter. EXAM: CT HEAD WITHOUT CONTRAST CT CERVICAL SPINE WITHOUT CONTRAST TECHNIQUE: Multidetector CT imaging of the head and cervical spine was performed following the standard protocol without intravenous contrast. Multiplanar CT image reconstructions of the cervical spine were also generated. COMPARISON:  CT of the head and cervical spine performed 03/14/2018, and MRI of the brain performed 10/17/2017 FINDINGS: CT HEAD FINDINGS Brain: No evidence of acute infarction, hemorrhage, hydrocephalus, extra-axial collection or mass lesion / mass effect. Prominence of the ventricles and sulci reflects moderately severe cortical volume loss. Cerebellar atrophy is noted. Diffuse periventricular and subcortical white matter change likely reflects small vessel ischemic microangiopathy. Chronic lacunar infarcts are noted at the corona radiata bilaterally. The brainstem and fourth ventricle are within normal limits. The basal ganglia are unremarkable in appearance. The cerebral hemispheres demonstrate grossly normal gray-white differentiation. No mass effect or midline shift is seen. Vascular: No hyperdense vessel or unexpected calcification. Skull: There is no evidence of fracture; visualized osseous structures are unremarkable in appearance. Sinuses/Orbits: The orbits are within normal limits. The paranasal sinuses and mastoid air cells are well-aerated. Other: No significant soft tissue abnormalities are seen. CT CERVICAL SPINE FINDINGS Alignment: Normal. Skull  base and vertebrae: No acute fracture. No primary bone lesion or focal pathologic process. Soft tissues and spinal canal: No prevertebral fluid or swelling. No visible canal hematoma. Disc levels: Multilevel disc space narrowing is noted along the cervical spine, with underlying facet disease. Scattered anterior and posterior disc osteophyte complexes are noted along the cervical spine. Upper chest: Mild scarring and atelectasis are noted at the lung apices, with a trace left pleural effusion. Scattered calcifications are noted at the thyroid gland,  with mild nonspecific heterogeneity noted bilaterally. There is mild calcification at the carotid bifurcations bilaterally. Other: No additional soft tissue abnormalities are seen. IMPRESSION: 1. No evidence of traumatic intracranial injury or fracture. 2. No evidence of fracture or subluxation along the cervical spine. 3. Moderately severe cortical volume loss and diffuse small vessel ischemic microangiopathy. 4. Chronic lacunar infarcts at the corona radiata bilaterally. 5. Mild degenerative change along the cervical spine. 6. Mild scarring and atelectasis at the lung apices, with a trace left pleural effusion. 7. Mild calcification at the carotid bifurcations bilaterally. Electronically Signed   By: Garald Balding M.D.   On: 03/19/2018 05:23   Ct Cervical Spine Wo Contrast  Result Date: 03/19/2018 CLINICAL DATA:  Status post unwitnessed fall, with concern for head or cervical spine injury. Initial encounter. EXAM: CT HEAD WITHOUT CONTRAST CT CERVICAL SPINE WITHOUT CONTRAST TECHNIQUE: Multidetector CT imaging of the head and cervical spine was performed following the standard protocol without intravenous contrast. Multiplanar CT image reconstructions of the cervical spine were also generated. COMPARISON:  CT of the head and cervical spine performed 03/14/2018, and MRI of the brain performed 10/17/2017 FINDINGS: CT HEAD FINDINGS Brain: No evidence of acute  infarction, hemorrhage, hydrocephalus, extra-axial collection or mass lesion / mass effect. Prominence of the ventricles and sulci reflects moderately severe cortical volume loss. Cerebellar atrophy is noted. Diffuse periventricular and subcortical white matter change likely reflects small vessel ischemic microangiopathy. Chronic lacunar infarcts are noted at the corona radiata bilaterally. The brainstem and fourth ventricle are within normal limits. The basal ganglia are unremarkable in appearance. The cerebral hemispheres demonstrate grossly normal gray-white differentiation. No mass effect or midline shift is seen. Vascular: No hyperdense vessel or unexpected calcification. Skull: There is no evidence of fracture; visualized osseous structures are unremarkable in appearance. Sinuses/Orbits: The orbits are within normal limits. The paranasal sinuses and mastoid air cells are well-aerated. Other: No significant soft tissue abnormalities are seen. CT CERVICAL SPINE FINDINGS Alignment: Normal. Skull base and vertebrae: No acute fracture. No primary bone lesion or focal pathologic process. Soft tissues and spinal canal: No prevertebral fluid or swelling. No visible canal hematoma. Disc levels: Multilevel disc space narrowing is noted along the cervical spine, with underlying facet disease. Scattered anterior and posterior disc osteophyte complexes are noted along the cervical spine. Upper chest: Mild scarring and atelectasis are noted at the lung apices, with a trace left pleural effusion. Scattered calcifications are noted at the thyroid gland, with mild nonspecific heterogeneity noted bilaterally. There is mild calcification at the carotid bifurcations bilaterally. Other: No additional soft tissue abnormalities are seen. IMPRESSION: 1. No evidence of traumatic intracranial injury or fracture. 2. No evidence of fracture or subluxation along the cervical spine. 3. Moderately severe cortical volume loss and diffuse  small vessel ischemic microangiopathy. 4. Chronic lacunar infarcts at the corona radiata bilaterally. 5. Mild degenerative change along the cervical spine. 6. Mild scarring and atelectasis at the lung apices, with a trace left pleural effusion. 7. Mild calcification at the carotid bifurcations bilaterally. Electronically Signed   By: Garald Balding M.D.   On: 03/19/2018 05:23    Procedures Procedures (including critical care time)  Medications Ordered in ED Medications  lidocaine (PF) (XYLOCAINE) 1 % injection 30 mL (30 mLs Infiltration Given by Other 03/14/18 1444)  metoprolol tartrate (LOPRESSOR) injection 2.5 mg (2.5 mg Intravenous Given 03/14/18 2035)     Initial Impression / Assessment and Plan / ED Course  I have reviewed the triage vital signs  and the nursing notes.  Pertinent labs & imaging results that were available during my care of the patient were reviewed by me and considered in my medical decision making (see chart for details).   EKG interpretation.  Interpreted by myself.  Initial EKG shows normal.  He had an episode of atrial tachycardia that resolved spontaneously.  He has not had his daily rope pressor.  Per review of his chart he has been had previous episodes of PAT treated with Lopressor.  Left hand x-rays show dislocation of PIP to both digit.  LACERATION REPAIR Performed by: Lolita Patella Authorized by: Lolita Patella Consent: Verbal consent obtained. Risks and benefits: risks, benefits and alternatives were discussed Consent given by: patient Patient identity confirmed: provided demographic data Prepped and Draped in normal sterile fashion Wound explored  Laceration Location: LUE Middle finger PIP  Laceration Length: 2cm  No Foreign Bodies seen or palpated  Anesthesia: local infiltration  Local anesthetic: lidocaine 1% without epinephrine  Anesthetic total: 4 ml  Irrigation method: syringe Amount of cleaning: standard  Skin closure: 4-0  nylon  Number of sutures: 8  Technique: Multiple simple, and horizontal mattress sutures.  The digit had to be defatted.  There is some nonviable and missing skin.  Overall closure.  I was able to visualize explore FDS, and FDP tendons.  They are intact.  Patient tolerance: Patient tolerated the procedure well with no immediate complications.   Chest x-ray shows multiple left rib fractures.  I did discuss this with trauma services.  He is not hypoxemic.  He has a suggestion of pleural effusion but his hemoglobin is stable.  No additional testing thought to be required.  I agree with this.  I talked with Dr. Amedeo Plenty.  He has seen the patient's hand and will see him in follow-up.  Reduction of dislocation Date/Time: 3:21 PM Performed by: Lolita Patella Authorized by: Lolita Patella Consent: Verbal consent obtained. Risks and benefits: risks, benefits and alternatives were discussed Consent given by: patient Required items: required blood products, implants, devices, and special equipment available Time out: Immediately prior to procedure a "time out" was called to verify the correct patient, procedure, equipment, support staff and site/side marked as required.  Patient sedated: no  Vitals: Vital signs were monitored during sedation. Patient tolerance: Patient tolerated the procedure well with no immediate complications. Joint: LUE middle, and ring finger PIP joints Reduction technique: Following metacarpal block.  Is able to reproduce the injury with hyperextension, seal traction, and gentle flexion.   Final Clinical Impressions(s) / ED Diagnoses   Final diagnoses:  Fall, initial encounter  Dislocation of finger, initial encounter   Patient will be moved at his facility to a part of the area where he will receive daily in room attention.  He will follow-up with hand surgery.  Keflex for wound prophylaxis.  Return with any new or worsening symptoms.   ED Discharge Orders         [facility] [place]     03/14/18 2053    Face-to-face encounter (required for Medicare/Medicaid patients)    Comments:  I Payton Prinsen certify that this patient is under my care and that I, or a nurse practitioner or physician's assistant working with me, had a face-to-face encounter that meets the physician face-to-face encounter requirements with this patient on 03/14/2018. The encounter with the patient was in whole, or in part for the following medical condition(s) which is the primary reason for  home health care (List medical condition): falls   03/14/18 2053    cephALEXin (KEFLEX) 500 MG capsule  3 times daily     03/14/18 2134       Tanna Furry, MD 03/20/18 [phone]

## 2018-03-21 DIAGNOSIS — S2242XD Multiple fractures of ribs, left side, subsequent encounter for fracture with routine healing: Secondary | ICD-10-CM | POA: Diagnosis not present

## 2018-03-21 DIAGNOSIS — S63271D Dislocation of unspecified interphalangeal joint of left index finger, subsequent encounter: Secondary | ICD-10-CM | POA: Diagnosis not present

## 2018-03-21 DIAGNOSIS — S63273D Dislocation of unspecified interphalangeal joint of left middle finger, subsequent encounter: Secondary | ICD-10-CM | POA: Diagnosis not present

## 2018-03-22 DIAGNOSIS — S2242XD Multiple fractures of ribs, left side, subsequent encounter for fracture with routine healing: Secondary | ICD-10-CM | POA: Diagnosis not present

## 2018-03-22 DIAGNOSIS — S63273D Dislocation of unspecified interphalangeal joint of left middle finger, subsequent encounter: Secondary | ICD-10-CM | POA: Diagnosis not present

## 2018-03-22 DIAGNOSIS — G609 Hereditary and idiopathic neuropathy, unspecified: Secondary | ICD-10-CM | POA: Diagnosis not present

## 2018-03-22 DIAGNOSIS — S63271D Dislocation of unspecified interphalangeal joint of left index finger, subsequent encounter: Secondary | ICD-10-CM | POA: Diagnosis not present

## 2018-03-22 DIAGNOSIS — I471 Supraventricular tachycardia: Secondary | ICD-10-CM | POA: Diagnosis not present

## 2018-03-22 DIAGNOSIS — I119 Hypertensive heart disease without heart failure: Secondary | ICD-10-CM | POA: Diagnosis not present

## 2018-03-24 DIAGNOSIS — I119 Hypertensive heart disease without heart failure: Secondary | ICD-10-CM | POA: Diagnosis not present

## 2018-03-24 DIAGNOSIS — S2242XD Multiple fractures of ribs, left side, subsequent encounter for fracture with routine healing: Secondary | ICD-10-CM | POA: Diagnosis not present

## 2018-03-24 DIAGNOSIS — G609 Hereditary and idiopathic neuropathy, unspecified: Secondary | ICD-10-CM | POA: Diagnosis not present

## 2018-03-24 DIAGNOSIS — S63271D Dislocation of unspecified interphalangeal joint of left index finger, subsequent encounter: Secondary | ICD-10-CM | POA: Diagnosis not present

## 2018-03-24 DIAGNOSIS — S63273D Dislocation of unspecified interphalangeal joint of left middle finger, subsequent encounter: Secondary | ICD-10-CM | POA: Diagnosis not present

## 2018-03-24 DIAGNOSIS — I471 Supraventricular tachycardia: Secondary | ICD-10-CM | POA: Diagnosis not present

## 2018-03-28 DIAGNOSIS — S2242XD Multiple fractures of ribs, left side, subsequent encounter for fracture with routine healing: Secondary | ICD-10-CM | POA: Diagnosis not present

## 2018-03-28 DIAGNOSIS — S63271D Dislocation of unspecified interphalangeal joint of left index finger, subsequent encounter: Secondary | ICD-10-CM | POA: Diagnosis not present

## 2018-03-28 DIAGNOSIS — I471 Supraventricular tachycardia: Secondary | ICD-10-CM | POA: Diagnosis not present

## 2018-03-28 DIAGNOSIS — G609 Hereditary and idiopathic neuropathy, unspecified: Secondary | ICD-10-CM | POA: Diagnosis not present

## 2018-03-28 DIAGNOSIS — I119 Hypertensive heart disease without heart failure: Secondary | ICD-10-CM | POA: Diagnosis not present

## 2018-03-28 DIAGNOSIS — S63273D Dislocation of unspecified interphalangeal joint of left middle finger, subsequent encounter: Secondary | ICD-10-CM | POA: Diagnosis not present

## 2018-03-29 DIAGNOSIS — G609 Hereditary and idiopathic neuropathy, unspecified: Secondary | ICD-10-CM | POA: Diagnosis not present

## 2018-03-29 DIAGNOSIS — S2242XD Multiple fractures of ribs, left side, subsequent encounter for fracture with routine healing: Secondary | ICD-10-CM | POA: Diagnosis not present

## 2018-03-29 DIAGNOSIS — S63271D Dislocation of unspecified interphalangeal joint of left index finger, subsequent encounter: Secondary | ICD-10-CM | POA: Diagnosis not present

## 2018-03-29 DIAGNOSIS — I119 Hypertensive heart disease without heart failure: Secondary | ICD-10-CM | POA: Diagnosis not present

## 2018-03-29 DIAGNOSIS — S63273D Dislocation of unspecified interphalangeal joint of left middle finger, subsequent encounter: Secondary | ICD-10-CM | POA: Diagnosis not present

## 2018-03-29 DIAGNOSIS — I471 Supraventricular tachycardia: Secondary | ICD-10-CM | POA: Diagnosis not present

## 2018-03-30 DIAGNOSIS — I471 Supraventricular tachycardia: Secondary | ICD-10-CM | POA: Diagnosis not present

## 2018-03-30 DIAGNOSIS — I119 Hypertensive heart disease without heart failure: Secondary | ICD-10-CM | POA: Diagnosis not present

## 2018-03-30 DIAGNOSIS — G609 Hereditary and idiopathic neuropathy, unspecified: Secondary | ICD-10-CM | POA: Diagnosis not present

## 2018-03-30 DIAGNOSIS — S2242XD Multiple fractures of ribs, left side, subsequent encounter for fracture with routine healing: Secondary | ICD-10-CM | POA: Diagnosis not present

## 2018-03-30 DIAGNOSIS — S63271D Dislocation of unspecified interphalangeal joint of left index finger, subsequent encounter: Secondary | ICD-10-CM | POA: Diagnosis not present

## 2018-03-30 DIAGNOSIS — S63273D Dislocation of unspecified interphalangeal joint of left middle finger, subsequent encounter: Secondary | ICD-10-CM | POA: Diagnosis not present

## 2018-04-03 DIAGNOSIS — I471 Supraventricular tachycardia: Secondary | ICD-10-CM | POA: Diagnosis not present

## 2018-04-03 DIAGNOSIS — R2689 Other abnormalities of gait and mobility: Secondary | ICD-10-CM | POA: Diagnosis not present

## 2018-04-03 DIAGNOSIS — S63273D Dislocation of unspecified interphalangeal joint of left middle finger, subsequent encounter: Secondary | ICD-10-CM | POA: Diagnosis not present

## 2018-04-03 DIAGNOSIS — Z79899 Other long term (current) drug therapy: Secondary | ICD-10-CM | POA: Diagnosis not present

## 2018-04-03 DIAGNOSIS — F0281 Dementia in other diseases classified elsewhere with behavioral disturbance: Secondary | ICD-10-CM | POA: Diagnosis not present

## 2018-04-03 DIAGNOSIS — I119 Hypertensive heart disease without heart failure: Secondary | ICD-10-CM | POA: Diagnosis not present

## 2018-04-03 DIAGNOSIS — S2242XD Multiple fractures of ribs, left side, subsequent encounter for fracture with routine healing: Secondary | ICD-10-CM | POA: Diagnosis not present

## 2018-04-03 DIAGNOSIS — G609 Hereditary and idiopathic neuropathy, unspecified: Secondary | ICD-10-CM | POA: Diagnosis not present

## 2018-04-03 DIAGNOSIS — S63271D Dislocation of unspecified interphalangeal joint of left index finger, subsequent encounter: Secondary | ICD-10-CM | POA: Diagnosis not present

## 2018-04-04 ENCOUNTER — Emergency Department (HOSPITAL_COMMUNITY): Payer: Medicare Other

## 2018-04-04 ENCOUNTER — Other Ambulatory Visit: Payer: Self-pay

## 2018-04-04 ENCOUNTER — Inpatient Hospital Stay (HOSPITAL_COMMUNITY)
Admission: EM | Admit: 2018-04-04 | Discharge: 2018-04-08 | DRG: 195 | Disposition: A | Discharge status: Home / Self Care | Payer: Medicare Other | Attending: Family Medicine | Admitting: Family Medicine

## 2018-04-04 DIAGNOSIS — Z79899 Other long term (current) drug therapy: Secondary | ICD-10-CM | POA: Diagnosis not present

## 2018-04-04 DIAGNOSIS — Z87891 Personal history of nicotine dependence: Secondary | ICD-10-CM

## 2018-04-04 DIAGNOSIS — M545 Low back pain: Secondary | ICD-10-CM | POA: Diagnosis not present

## 2018-04-04 DIAGNOSIS — J181 Lobar pneumonia, unspecified organism: Principal | ICD-10-CM | POA: Diagnosis present

## 2018-04-04 DIAGNOSIS — M5489 Other dorsalgia: Secondary | ICD-10-CM | POA: Diagnosis not present

## 2018-04-04 DIAGNOSIS — S299XXA Unspecified injury of thorax, initial encounter: Secondary | ICD-10-CM | POA: Diagnosis not present

## 2018-04-04 DIAGNOSIS — Z882 Allergy status to sulfonamides status: Secondary | ICD-10-CM

## 2018-04-04 DIAGNOSIS — Z9842 Cataract extraction status, left eye: Secondary | ICD-10-CM | POA: Diagnosis not present

## 2018-04-04 DIAGNOSIS — N400 Enlarged prostate without lower urinary tract symptoms: Secondary | ICD-10-CM | POA: Diagnosis present

## 2018-04-04 DIAGNOSIS — Z8673 Personal history of transient ischemic attack (TIA), and cerebral infarction without residual deficits: Secondary | ICD-10-CM | POA: Diagnosis not present

## 2018-04-04 DIAGNOSIS — I10 Essential (primary) hypertension: Secondary | ICD-10-CM | POA: Diagnosis present

## 2018-04-04 DIAGNOSIS — E559 Vitamin D deficiency, unspecified: Secondary | ICD-10-CM | POA: Diagnosis present

## 2018-04-04 DIAGNOSIS — J189 Pneumonia, unspecified organism: Secondary | ICD-10-CM | POA: Diagnosis not present

## 2018-04-04 DIAGNOSIS — S199XXA Unspecified injury of neck, initial encounter: Secondary | ICD-10-CM | POA: Diagnosis not present

## 2018-04-04 DIAGNOSIS — Z9841 Cataract extraction status, right eye: Secondary | ICD-10-CM | POA: Diagnosis not present

## 2018-04-04 DIAGNOSIS — T148XXA Other injury of unspecified body region, initial encounter: Secondary | ICD-10-CM | POA: Diagnosis not present

## 2018-04-04 DIAGNOSIS — S0990XA Unspecified injury of head, initial encounter: Secondary | ICD-10-CM | POA: Diagnosis not present

## 2018-04-04 DIAGNOSIS — W19XXXA Unspecified fall, initial encounter: Secondary | ICD-10-CM

## 2018-04-04 DIAGNOSIS — R0602 Shortness of breath: Secondary | ICD-10-CM | POA: Diagnosis not present

## 2018-04-04 DIAGNOSIS — Z7982 Long term (current) use of aspirin: Secondary | ICD-10-CM | POA: Diagnosis not present

## 2018-04-04 DIAGNOSIS — R52 Pain, unspecified: Secondary | ICD-10-CM | POA: Diagnosis not present

## 2018-04-04 DIAGNOSIS — R296 Repeated falls: Secondary | ICD-10-CM | POA: Diagnosis present

## 2018-04-04 LAB — CBC WITH DIFFERENTIAL/PLATELET
BASOS PCT: 0 %
Basophils Absolute: 0 10*3/uL (ref 0.0–0.1)
EOS ABS: 0.1 10*3/uL (ref 0.0–0.7)
Eosinophils Relative: 2 %
HCT: 43.2 % (ref 39.0–52.0)
HEMOGLOBIN: 14.3 g/dL (ref 13.0–17.0)
Lymphocytes Relative: 23 %
Lymphs Abs: 2.1 10*3/uL (ref 0.7–4.0)
MCH: 30.4 pg (ref 26.0–34.0)
MCHC: 33.1 g/dL (ref 30.0–36.0)
MCV: 91.9 fL (ref 78.0–100.0)
MONO ABS: 0.6 10*3/uL (ref 0.1–1.0)
MONOS PCT: 6 %
NEUTROS PCT: 69 %
Neutro Abs: 6.6 10*3/uL (ref 1.7–7.7)
Platelets: 385 10*3/uL (ref 150–400)
RBC: 4.7 MIL/uL (ref 4.22–5.81)
RDW: 15.2 % (ref 11.5–15.5)
WBC: 9.4 10*3/uL (ref 4.0–10.5)

## 2018-04-04 MED ORDER — SODIUM CHLORIDE 0.9 % IV SOLN
1.0000 g | Freq: Once | INTRAVENOUS | Status: AC
  Administered 2018-04-05: 1 g via INTRAVENOUS
  Filled 2018-04-04: qty 10

## 2018-04-04 MED ORDER — ACETAMINOPHEN 325 MG PO TABS
650.0000 mg | ORAL_TABLET | Freq: Once | ORAL | Status: AC
  Administered 2018-04-04: 650 mg via ORAL
  Filled 2018-04-04: qty 2

## 2018-04-04 MED ORDER — SODIUM CHLORIDE 0.9 % IV SOLN
500.0000 mg | Freq: Once | INTRAVENOUS | Status: AC
  Administered 2018-04-05: 500 mg via INTRAVENOUS
  Filled 2018-04-04: qty 500

## 2018-04-04 NOTE — ED Notes (Signed)
Bed: WA09 Expected date:  Expected time:  Means of arrival:  Comments: 84 yo M   Fall

## 2018-04-04 NOTE — ED Triage Notes (Signed)
Patient was attempting to get out of bed and fell. He complains of pain in his mid back area.

## 2018-04-04 NOTE — ED Notes (Signed)
Patient's family states he has fallen 5 times in the last 2 weeks.

## 2018-04-04 NOTE — ED Notes (Signed)
Patient's son called stating that he was healthcare power and gave permission for Carl Rios to also obtain information on the patient.   Family expresses great concern for the patient's condition pin pointing the multiple admission (3 stated in the last 30 days) due to fall. They are concerned for the patient's safety at the current facility and also express concern that the patient has sustained multiple injures with each fall.

## 2018-04-04 NOTE — ED Provider Notes (Addendum)
[facility] DEPT Provider Note   CSN: [id number] Arrival date & time: 04/04/18  1952  LEVEL 5 CAVEAT - SEVERE HEARING DIFFICULTY   History   Chief Complaint Chief Complaint  Patient presents with  . Fall    HPI Carl Rios is a 84 y.o. male.  HPI  84 year old male with a history of difficulty hearing and frequent falls presents after a fall.  The patient was trying to transfer from a wheelchair to the bed and then fell to the ground.  Unclear if he hit his head.  Nursing staff states he was then dropped when being picked up by EMS.  Patient is complaining of mid and lower back pain.  He previously has fallen multiple times in the past month, resulting in rib fractures and hand fracture.  Past Medical History:  Diagnosis Date  . AAA (abdominal aortic aneurysm) without rupture ([place])   . CVA (cerebral infarction) 03/2014   right PCA CVA  . Diverticulosis   . Diverticulosis   . Dyslipidemia   . GERD (gastroesophageal reflux disease)   . Hearing difficulty   . Hemorrhoids   . Hiatal hernia 1960  . History of colon polyps   . History of prostatitis   . Hx of adenomatous colonic polyps   . Hyperlipidemia   . Lower GI bleed 12/2011   secondary to diverticulosis  . Macular degeneration   . Obesity   . Peripheral neuropathy   . Peripheral vascular disease ([place])   . Premature ventricular contraction   . Rhabdomyolysis 03/2014   Due to prolonged fall  . Senile osteoporosis    as per documented on reclast infusion orders  . Tachycardia-bradycardia ([place])   . Unsteady gait     Patient Active Problem List   Diagnosis Date Noted  . Paroxysmal atrial tachycardia ([place])   . Orthostasis 10/16/2017  . Gait difficulty 10/16/2017  . History of CVA (cerebrovascular accident) 10/16/2017  . Pressure injury of skin 09/09/2017  . Syncope and collapse 09/08/2017  . PVC's (premature ventricular contractions) 09/23/2014  . Bradycardia 09/23/2014  .  Depression 04/17/2014  . Recurrent falls 04/01/2014  . Paroxysmal SVT (supraventricular tachycardia) ([place]) 04/01/2014  . Unsteady gait 04/01/2014  . CVA (cerebral infarction) 03/31/2014  . Acute renal failure ([place]) 03/27/2014  . Dehydration 03/27/2014  . Rhabdomyolysis 03/26/2014  . AAA (abdominal aortic aneurysm) without rupture ([place]) 08/14/2013  . Abnormality of gait 01/08/2013  . Other general symptoms(780.99) 01/08/2013  . Other vitamin B12 deficiency anemia 01/08/2013  . Polyneuropathy in other diseases classified elsewhere ([place]) 01/08/2013  . Abdominal aneurysm without mention of rupture 02/01/2012  . Hypokalemia 01/25/2012  . Anemia due to acute blood loss 01/24/2012  . Benign neoplasm of colon 01/24/2012  . Hypertension 01/19/2012  . Other and unspecified hyperlipidemia 01/18/2012  . Impaired fasting glucose 01/18/2012  . Unspecified hereditary and idiopathic peripheral neuropathy 01/18/2012  . Hypertonicity of bladder 01/18/2012  . Diverticulosis 01/18/2012  . Rectal bleeding 01/18/2012  . GERD 02/09/2010  . COLONIC POLYPS, ADENOMATOUS, HX OF 02/09/2010    Past Surgical History:  Procedure Laterality Date  . ABDOMINAL AORTIC ANEURYSM REPAIR    . ABDOMINAL AORTIC ENDOVASCULAR STENT GRAFT N/A 08/23/2013   Procedure: ABDOMINAL AORTIC ENDOVASCULAR STENT GRAFT;  Surgeon: Mal Misty, MD;  Location: [facility];  Service: Vascular;  Laterality: N/A;  . CATARACT EXTRACTION Bilateral 07/2009, 09/2009  . COLONOSCOPY  01/24/2012   Procedure: COLONOSCOPY;  Surgeon: Lafayette Dragon, MD;  Location: [facility];  Service: Endoscopy;  Laterality: N/A;  . INGUINAL HERNIA REPAIR    . KNEE SURGERY     left knee in 1970's  . TESTICLE SURGERY  1949   Undescended testicle  . TONSILLECTOMY          Home Medications    Prior to Admission medications   Medication Sig Start Date End Date Taking? Authorizing Provider  acetaminophen (TYLENOL) 325 MG tablet Take 650 mg by mouth every 6  (six) hours as needed.   Yes [provider]  aspirin EC 81 MG EC tablet Take 1 tablet (81 mg total) by mouth daily. 03/29/14  Yes Marton Redwood, MD  DULoxetine (CYMBALTA) 30 MG capsule Take 1 capsule (30 mg total) by mouth daily. 02/19/14  Yes Kathrynn Ducking, MD  loratadine (CLARITIN) 10 MG tablet Take 10 mg by mouth daily.   Yes [provider]  metoprolol tartrate (LOPRESSOR) 25 MG tablet Take 12.5 mg by mouth 2 (two) times daily.  05/22/14  Yes [provider]  midodrine (PROAMATINE) 2.5 MG tablet Take 1 tablet (2.5 mg total) 2 (two) times daily with a meal by mouth. 09/09/17  Yes Georgette Shell, MD  Multiple Vitamins-Minerals (MACULAR VITAMIN BENEFIT) TABS Take 1 tablet by mouth daily.    Yes [provider]  polyethylene glycol (MIRALAX / GLYCOLAX) packet Take 17 g daily as needed by mouth for mild constipation. 09/09/17  Yes Georgette Shell, MD  senna (SENOKOT) 8.6 MG TABS tablet Take 1 tablet (8.6 mg total) 2 (two) times daily by mouth. 09/09/17  Yes Georgette Shell, MD  tamsulosin (FLOMAX) 0.4 MG CAPS capsule Take 0.4 mg daily with supper by mouth. 08/01/17  Yes [provider]  Vitamin D, Ergocalciferol, (DRISDOL) 50000 units CAPS capsule Take 50,000 Units by mouth every 7 (seven) days.   Yes [provider]  cephALEXin (KEFLEX) 500 MG capsule Take 1 capsule (500 mg total) by mouth 3 (three) times daily. Patient not taking: Reported on 04/04/2018 03/14/18   Tanna Furry, MD    Family History Family History  Problem Relation Age of Onset  . Heart disease Father 4       Rheumatic heart  . Alcohol abuse Sister     Social History Social History   Tobacco Use  . Smoking status: Former Smoker    Years: 5.00    Types: Cigarettes    Last attempt to quit: 11/02/1955    Years since quitting: 62.4  . Smokeless tobacco: Never Used  Substance Use Topics  . Alcohol use: Yes    Alcohol/week: 1.8 oz    Types: 3 Standard drinks  or equivalent per week    Comment: 3 drinks per week   (QUIT X 6 MONTHS)  . Drug use: No     Allergies   Sulfamethoxazole   Review of Systems Review of Systems  Unable to perform ROS: Other     Physical Exam Updated Vital Signs BP 137/83   Pulse 95   Temp 97.7 F (36.5 C) (Oral)   Resp (!) 21   SpO2 92%   Physical Exam  Constitutional: He appears well-developed and well-nourished. No distress.  HENT:  Head: Normocephalic and atraumatic.  Right Ear: External ear normal.  Left Ear: External ear normal.  Nose: Nose normal.  No obvious head trauma  Eyes: Right eye exhibits no discharge. Left eye exhibits no discharge.  Neck: Normal range of motion. Neck supple. No spinous process tenderness and no muscular  tenderness present.  Cardiovascular: Normal rate, regular rhythm and normal heart sounds.  Pulmonary/Chest: Effort normal and breath sounds normal.  Multiple bruises to lower anterior chest  Abdominal: Soft. There is no tenderness.  Musculoskeletal: He exhibits no edema.       Right hip: He exhibits normal range of motion and no tenderness.       Left hip: He exhibits normal range of motion and no tenderness.       Cervical back: He exhibits no tenderness.       Thoracic back: He exhibits tenderness.       Lumbar back: He exhibits tenderness.  Significant pain to back whenever trying to move patient. Left arm in splint  Neurological: He is alert.  Skin: Skin is warm and dry. He is not diaphoretic.  Nursing note and vitals reviewed.    ED Treatments / Results  Labs (all labs ordered are listed, but only abnormal results are displayed) Labs Reviewed  CULTURE, BLOOD (ROUTINE X 2)  CULTURE, BLOOD (ROUTINE X 2)  CBC WITH DIFFERENTIAL/PLATELET  BASIC METABOLIC PANEL    EKG EKG Interpretation  Date/Time:  Tuesday April 04 2018 20:10:23 EDT Ventricular Rate:  92 PR Interval:    QRS Duration: 90 QT Interval:  356 QTC Calculation: 441 R Axis:   -64 Text  Interpretation:  Sinus rhythm Ventricular premature complex Inferior infarct, old Consider anterior infarct svt no longer present compared to May 2019 Confirmed by Sherwood Gambler [phone]) on 04/04/2018 8:59:23 PM   Radiology Dg Chest 1 View  Result Date: 04/04/2018 CLINICAL DATA:  Fall getting out of bed. EXAM: CHEST  1 VIEW COMPARISON:  Radiographs 03/19/2018, additional priors FINDINGS: Low lung volumes. Unchanged heart size and mediastinal contours with tortuous thoracic aorta. Ill-defined left basilar opacity may be atelectasis or small pleural effusion. Minimal right infrahilar atelectasis. No pneumothorax. Multiple left rib fractures, as seen on prior exams. IMPRESSION: 1. Low lung volumes. Ill-defined left basilar opacity may be combination of atelectasis and pleural fluid. 2. Tortuous atherosclerotic aorta, unchanged. 3. Left rib fractures, subacute or chronic, as seen on prior exams. Electronically Signed   By: Jeb Levering M.D.   On: 04/04/2018 21:32   Dg Thoracic Spine W/swimmers  Result Date: 04/04/2018 CLINICAL DATA:  Thoracolumbar back pain after fall attempting to get out of bed. EXAM: THORACIC SPINE - 3 VIEWS COMPARISON:  Radiographs 2 weeks prior 03/19/2018 FINDINGS: Lateral view slightly limited by cross-table technique. Chronic loss of height of mid and lower thoracic vertebral bodies, tentatively T11 and T6, unchanged from prior exam. No evidence of acute fracture. No paravertebral soft tissue abnormality to suggest fracture. IMPRESSION: Mild chronic compression fractures in the thoracic spine without evidence of acute abnormality. Electronically Signed   By: Jeb Levering M.D.   On: 04/04/2018 21:26   Dg Lumbar Spine Complete  Result Date: 04/04/2018 CLINICAL DATA:  Thoracolumbar back pain after fall out of bed. EXAM: LUMBAR SPINE - COMPLETE 4+ VIEW COMPARISON:  Radiographs 2 weeks prior 03/19/2018 FINDINGS: Chronic compression fractures of T11, T12, L2 and L3. No progression  from prior. No acute fracture. Facet arthropathy of the lower lumbar spine. Similar degenerative disc disease. Bones are under mineralized. Aorto bi-iliac stent graft in place. IMPRESSION: Chronic compression fractures in the lumbar spine without progression or acute abnormality. Electronically Signed   By: Jeb Levering M.D.   On: 04/04/2018 21:28   Ct Head Wo Contrast  Result Date: 04/04/2018 CLINICAL DATA:  Fall. EXAM: CT HEAD WITHOUT CONTRAST  CT CERVICAL SPINE WITHOUT CONTRAST TECHNIQUE: Multidetector CT imaging of the head and cervical spine was performed following the standard protocol without intravenous contrast. Multiplanar CT image reconstructions of the cervical spine were also generated. COMPARISON:  CT head and cervical spine dated Mar 19, 2018. FINDINGS: CT HEAD FINDINGS Brain: No evidence of acute infarction, hemorrhage, hydrocephalus, extra-axial collection or mass lesion/mass effect. Stable moderate cerebral atrophy and severe chronic microvascular ischemic changes. Unchanged lacunar infarcts in the bilateral thalami and left corona radiata. Unchanged right occipital lobe encephalomalacia. Vascular: Calcified atherosclerosis at the skullbase. No hyperdense vessel. Skull: Negative for fracture. Sinuses/Orbits: No acute finding. Other: None. CT CERVICAL SPINE FINDINGS Alignment: Normal. Skull base and vertebrae: No acute fracture. No primary bone lesion or focal pathologic process. Unchanged chronic T3 compression deformity. Soft tissues and spinal canal: No prevertebral fluid or swelling. No visible canal hematoma. Disc levels: Moderate multilevel degenerative disc disease and facet arthropathy throughout the cervical spine. Upper chest: Negative. Other: Unchanged heterogeneous thyroid gland. IMPRESSION: 1.  No acute intracranial abnormality. 2.  No acute cervical spine fracture. Electronically Signed   By: Titus Dubin M.D.   On: 04/04/2018 22:06   Ct Chest Wo Contrast  Result Date:  04/04/2018 CLINICAL DATA:  Assess for pneumonia. Shortness of breath. EXAM: CT CHEST WITHOUT CONTRAST TECHNIQUE: Multidetector CT imaging of the chest was performed following the standard protocol without IV contrast. COMPARISON:  Chest radiograph performed earlier today at 9:07 p.m., and CT of the chest performed 09/09/2014 FINDINGS: Cardiovascular: The heart is normal in size. Diffuse coronary artery calcifications are seen. Scattered calcification is noted along the thoracic aorta. The great vessels are grossly unremarkable, though not well assessed without contrast. Mediastinum/Nodes: The mediastinum is grossly unremarkable. No mediastinal lymphadenopathy is seen. No pericardial effusion is identified. A 3.5 cm mildly heterogeneous left thyroid lobe mass is noted. No axillary lymphadenopathy is seen. Lungs/Pleura: The mild left basilar airspace opacity may reflect atelectasis or pneumonia. No pleural effusion or pneumothorax is seen. No dominant mass is seen. Mild bronchomalacia is noted. Upper Abdomen: The visualized portions of the liver and spleen are unremarkable. The visualized portions of the adrenal glands and kidneys are within normal limits. There is aneurysmal dilatation of the proximal abdominal aorta to 3.4 cm in AP dimension. Musculoskeletal: There are non-healed displaced subacute fractures of the left sixth through eleventh ribs. There is mild chronic loss of height at vertebral bodies T3, T6, T11 and T12. The visualized musculature is unremarkable in appearance. IMPRESSION: 1. Mild left basilar airspace opacity may reflect atelectasis or pneumonia. 2. Non-healed subacute fractures of the left sixth through eleventh ribs. 3. Diffuse coronary artery calcifications seen. 4. 3.5 cm mildly heterogeneous left thyroid lobe mass noted. Recommend further evaluation with thyroid ultrasound. If patient is clinically hyperthyroid, consider nuclear medicine thyroid uptake and scan. 5. Aneurysmal dilatation of  the proximal abdominal aorta to 3.4 cm in AP dimension. Recommend followup by [place] in 3 years. This recommendation follows ACR consensus guidelines: White Paper of the ACR Incidental Findings Committee II on Vascular Findings. J Am Coll Radiol 2013; 10:789-794 Aortic Atherosclerosis (ICD10-I70.0). Electronically Signed   By: Garald Balding M.D.   On: 04/04/2018 23:26   Ct Cervical Spine Wo Contrast  Result Date: 04/04/2018 CLINICAL DATA:  Fall. EXAM: CT HEAD WITHOUT CONTRAST CT CERVICAL SPINE WITHOUT CONTRAST TECHNIQUE: Multidetector CT imaging of the head and cervical spine was performed following the standard protocol without intravenous contrast. Multiplanar CT image reconstructions of the cervical spine were  also generated. COMPARISON:  CT head and cervical spine dated Mar 19, 2018. FINDINGS: CT HEAD FINDINGS Brain: No evidence of acute infarction, hemorrhage, hydrocephalus, extra-axial collection or mass lesion/mass effect. Stable moderate cerebral atrophy and severe chronic microvascular ischemic changes. Unchanged lacunar infarcts in the bilateral thalami and left corona radiata. Unchanged right occipital lobe encephalomalacia. Vascular: Calcified atherosclerosis at the skullbase. No hyperdense vessel. Skull: Negative for fracture. Sinuses/Orbits: No acute finding. Other: None. CT CERVICAL SPINE FINDINGS Alignment: Normal. Skull base and vertebrae: No acute fracture. No primary bone lesion or focal pathologic process. Unchanged chronic T3 compression deformity. Soft tissues and spinal canal: No prevertebral fluid or swelling. No visible canal hematoma. Disc levels: Moderate multilevel degenerative disc disease and facet arthropathy throughout the cervical spine. Upper chest: Negative. Other: Unchanged heterogeneous thyroid gland. IMPRESSION: 1.  No acute intracranial abnormality. 2.  No acute cervical spine fracture. Electronically Signed   By: Titus Dubin M.D.   On: 04/04/2018 22:06     Procedures Procedures (including critical care time)  Medications Ordered in ED Medications  cefTRIAXone (ROCEPHIN) 1 g in sodium chloride 0.9 % 100 mL IVPB (has no administration in time range)  azithromycin (ZITHROMAX) 500 mg in sodium chloride 0.9 % 250 mL IVPB (has no administration in time range)  acetaminophen (TYLENOL) tablet 650 mg (650 mg Oral Given 04/04/18 2150)     Initial Impression / Assessment and Plan / ED Course  I have reviewed the triage vital signs and the nursing notes.  Pertinent labs & imaging results that were available during my care of the patient were reviewed by me and considered in my medical decision making (see chart for details).     Patient with a recurrent fall.  Chest x-ray shows no new rib fractures but is noted to have a possible pneumonia.  Thus a CT was obtained which shows atelectasis versus pneumonia.  Given his low oxygen saturations of 90-92% I think this is probably pneumonia.  I think given this with his recurrent falls he will need admission as he currently only has assisted living help according to his son.  He will probably need an actual rehabilitation stay versus full nursing home stay.  However I think he needs to be treated for pneumonia first.  Labs are being evaluated and after lab work has returned, patient will likely be admitted.  Care to Dr. Leonette Monarch. Son updated.  Final Clinical Impressions(s) / ED Diagnoses   Final diagnoses:  Fall, initial encounter  Community acquired pneumonia of left lower lobe of lung [facility])    ED Discharge Orders        [place]     04/04/18 2239    Face-to-face encounter (required for Medicare/Medicaid patients)    Comments:  I [name] certify that this patient is under my care and that I, or a nurse practitioner or physician's assistant working with me, had a face-to-face encounter that meets the physician face-to-face encounter requirements with this patient on 04/04/2018. The  encounter with the patient was in whole, or in part for the following medical condition(s) which is the primary reason for home health care (List medical condition): Frequent falls   04/04/18 2239       Sherwood Gambler, MD 04/05/18 Lynnell Catalan    Sherwood Gambler, MD 04/05/18 0004

## 2018-04-05 ENCOUNTER — Encounter (HOSPITAL_COMMUNITY): Payer: Self-pay | Admitting: Internal Medicine

## 2018-04-05 DIAGNOSIS — G629 Polyneuropathy, unspecified: Secondary | ICD-10-CM | POA: Diagnosis not present

## 2018-04-05 DIAGNOSIS — R1312 Dysphagia, oropharyngeal phase: Secondary | ICD-10-CM | POA: Diagnosis not present

## 2018-04-05 DIAGNOSIS — J189 Pneumonia, unspecified organism: Secondary | ICD-10-CM | POA: Diagnosis not present

## 2018-04-05 DIAGNOSIS — R278 Other lack of coordination: Secondary | ICD-10-CM | POA: Diagnosis not present

## 2018-04-05 DIAGNOSIS — S63289D Dislocation of proximal interphalangeal joint of unspecified finger, subsequent encounter: Secondary | ICD-10-CM | POA: Diagnosis not present

## 2018-04-05 DIAGNOSIS — R296 Repeated falls: Secondary | ICD-10-CM | POA: Diagnosis present

## 2018-04-05 DIAGNOSIS — Z8673 Personal history of transient ischemic attack (TIA), and cerebral infarction without residual deficits: Secondary | ICD-10-CM | POA: Diagnosis not present

## 2018-04-05 DIAGNOSIS — H3530 Unspecified macular degeneration: Secondary | ICD-10-CM | POA: Diagnosis not present

## 2018-04-05 DIAGNOSIS — Z79899 Other long term (current) drug therapy: Secondary | ICD-10-CM | POA: Diagnosis not present

## 2018-04-05 DIAGNOSIS — Z9842 Cataract extraction status, left eye: Secondary | ICD-10-CM | POA: Diagnosis not present

## 2018-04-05 DIAGNOSIS — R1314 Dysphagia, pharyngoesophageal phase: Secondary | ICD-10-CM | POA: Diagnosis not present

## 2018-04-05 DIAGNOSIS — M810 Age-related osteoporosis without current pathological fracture: Secondary | ICD-10-CM | POA: Diagnosis not present

## 2018-04-05 DIAGNOSIS — I951 Orthostatic hypotension: Secondary | ICD-10-CM | POA: Diagnosis not present

## 2018-04-05 DIAGNOSIS — R41841 Cognitive communication deficit: Secondary | ICD-10-CM | POA: Diagnosis not present

## 2018-04-05 DIAGNOSIS — R2681 Unsteadiness on feet: Secondary | ICD-10-CM | POA: Diagnosis not present

## 2018-04-05 DIAGNOSIS — N419 Inflammatory disease of prostate, unspecified: Secondary | ICD-10-CM | POA: Diagnosis not present

## 2018-04-05 DIAGNOSIS — Z87891 Personal history of nicotine dependence: Secondary | ICD-10-CM | POA: Diagnosis not present

## 2018-04-05 DIAGNOSIS — J309 Allergic rhinitis, unspecified: Secondary | ICD-10-CM | POA: Diagnosis not present

## 2018-04-05 DIAGNOSIS — K5900 Constipation, unspecified: Secondary | ICD-10-CM | POA: Diagnosis not present

## 2018-04-05 DIAGNOSIS — Z7982 Long term (current) use of aspirin: Secondary | ICD-10-CM | POA: Diagnosis not present

## 2018-04-05 DIAGNOSIS — K5790 Diverticulosis of intestine, part unspecified, without perforation or abscess without bleeding: Secondary | ICD-10-CM | POA: Diagnosis not present

## 2018-04-05 DIAGNOSIS — E785 Hyperlipidemia, unspecified: Secondary | ICD-10-CM | POA: Diagnosis not present

## 2018-04-05 DIAGNOSIS — H9190 Unspecified hearing loss, unspecified ear: Secondary | ICD-10-CM | POA: Diagnosis not present

## 2018-04-05 DIAGNOSIS — M6281 Muscle weakness (generalized): Secondary | ICD-10-CM | POA: Diagnosis not present

## 2018-04-05 DIAGNOSIS — J181 Lobar pneumonia, unspecified organism: Principal | ICD-10-CM

## 2018-04-05 DIAGNOSIS — I739 Peripheral vascular disease, unspecified: Secondary | ICD-10-CM | POA: Diagnosis not present

## 2018-04-05 DIAGNOSIS — I69398 Other sequelae of cerebral infarction: Secondary | ICD-10-CM | POA: Diagnosis not present

## 2018-04-05 DIAGNOSIS — N3281 Overactive bladder: Secondary | ICD-10-CM | POA: Diagnosis not present

## 2018-04-05 DIAGNOSIS — Z9841 Cataract extraction status, right eye: Secondary | ICD-10-CM | POA: Diagnosis not present

## 2018-04-05 DIAGNOSIS — Z9181 History of falling: Secondary | ICD-10-CM | POA: Diagnosis not present

## 2018-04-05 DIAGNOSIS — M2550 Pain in unspecified joint: Secondary | ICD-10-CM | POA: Diagnosis not present

## 2018-04-05 DIAGNOSIS — S2242XD Multiple fractures of ribs, left side, subsequent encounter for fracture with routine healing: Secondary | ICD-10-CM | POA: Diagnosis not present

## 2018-04-05 DIAGNOSIS — W19XXXA Unspecified fall, initial encounter: Secondary | ICD-10-CM | POA: Insufficient documentation

## 2018-04-05 DIAGNOSIS — I714 Abdominal aortic aneurysm, without rupture: Secondary | ICD-10-CM | POA: Diagnosis not present

## 2018-04-05 DIAGNOSIS — I10 Essential (primary) hypertension: Secondary | ICD-10-CM | POA: Diagnosis not present

## 2018-04-05 DIAGNOSIS — E559 Vitamin D deficiency, unspecified: Secondary | ICD-10-CM | POA: Diagnosis present

## 2018-04-05 DIAGNOSIS — Z882 Allergy status to sulfonamides status: Secondary | ICD-10-CM | POA: Diagnosis not present

## 2018-04-05 DIAGNOSIS — N400 Enlarged prostate without lower urinary tract symptoms: Secondary | ICD-10-CM | POA: Diagnosis present

## 2018-04-05 DIAGNOSIS — K219 Gastro-esophageal reflux disease without esophagitis: Secondary | ICD-10-CM | POA: Diagnosis not present

## 2018-04-05 DIAGNOSIS — Z7401 Bed confinement status: Secondary | ICD-10-CM | POA: Diagnosis not present

## 2018-04-05 LAB — BASIC METABOLIC PANEL
ANION GAP: 8 (ref 5–15)
BUN: 18 mg/dL (ref 6–20)
CALCIUM: 8.6 mg/dL — AB (ref 8.9–10.3)
CO2: 25 mmol/L (ref 22–32)
Chloride: 105 mmol/L (ref 101–111)
Creatinine, Ser: 0.78 mg/dL (ref 0.61–1.24)
GFR calc non Af Amer: >60 mL/min (ref >60)
Glucose, Bld: 115 mg/dL — ABNORMAL HIGH (ref 65–99)
POTASSIUM: 4.1 mmol/L (ref 3.5–5.1)
Sodium: 138 mmol/L (ref 135–145)

## 2018-04-05 LAB — HIV ANTIBODY (ROUTINE TESTING W REFLEX): HIV Screen 4th Generation wRfx: NONREACTIVE

## 2018-04-05 LAB — STREP PNEUMONIAE URINARY ANTIGEN: STREP PNEUMO URINARY ANTIGEN: NEGATIVE

## 2018-04-05 MED ORDER — ASPIRIN EC 81 MG PO TBEC
81.0000 mg | DELAYED_RELEASE_TABLET | Freq: Every day | ORAL | Status: DC
  Administered 2018-04-05 – 2018-04-08 (×4): 81 mg via ORAL
  Filled 2018-04-05 (×4): qty 1

## 2018-04-05 MED ORDER — ENOXAPARIN SODIUM 40 MG/0.4ML ~~LOC~~ SOLN
40.0000 mg | SUBCUTANEOUS | Status: DC
  Administered 2018-04-05 – 2018-04-07 (×3): 40 mg via SUBCUTANEOUS
  Filled 2018-04-05 (×3): qty 0.4

## 2018-04-05 MED ORDER — TAMSULOSIN HCL 0.4 MG PO CAPS
0.4000 mg | ORAL_CAPSULE | Freq: Every day | ORAL | Status: DC
  Administered 2018-04-05 – 2018-04-07 (×3): 0.4 mg via ORAL
  Filled 2018-04-05 (×3): qty 1

## 2018-04-05 MED ORDER — SODIUM CHLORIDE 0.9 % IV SOLN
1.0000 g | INTRAVENOUS | Status: DC
  Administered 2018-04-05 – 2018-04-07 (×3): 1 g via INTRAVENOUS
  Filled 2018-04-05: qty 10
  Filled 2018-04-05 (×3): qty 1

## 2018-04-05 MED ORDER — POLYETHYLENE GLYCOL 3350 17 G PO PACK: 17.0000 g | PACK | Freq: Every day | ORAL | Status: DC | PRN

## 2018-04-05 MED ORDER — SODIUM CHLORIDE 0.9 % IV SOLN
500.0000 mg | INTRAVENOUS | Status: DC
  Administered 2018-04-05 – 2018-04-07 (×3): 500 mg via INTRAVENOUS
  Filled 2018-04-05 (×4): qty 500

## 2018-04-05 MED ORDER — PROSIGHT PO TABS
1.0000 | ORAL_TABLET | Freq: Every day | ORAL | Status: DC
  Administered 2018-04-05 – 2018-04-08 (×4): 1 via ORAL
  Filled 2018-04-05 (×4): qty 1

## 2018-04-05 MED ORDER — ORAL CARE MOUTH RINSE
15.0000 mL | Freq: Two times a day (BID) | OROMUCOSAL | Status: DC
  Administered 2018-04-06 – 2018-04-08 (×2): 15 mL via OROMUCOSAL

## 2018-04-05 MED ORDER — DULOXETINE HCL 30 MG PO CPEP
30.0000 mg | ORAL_CAPSULE | Freq: Every day | ORAL | Status: DC
  Administered 2018-04-05 – 2018-04-08 (×4): 30 mg via ORAL
  Filled 2018-04-05 (×4): qty 1

## 2018-04-05 MED ORDER — VITAMIN D (ERGOCALCIFEROL) 1.25 MG (50000 UNIT) PO CAPS
50000.0000 [IU] | ORAL_CAPSULE | ORAL | Status: DC
  Administered 2018-04-05: 50000 [IU] via ORAL
  Filled 2018-04-05: qty 1

## 2018-04-05 MED ORDER — ENSURE ENLIVE PO LIQD
237.0000 mL | Freq: Two times a day (BID) | ORAL | Status: DC
  Administered 2018-04-06 – 2018-04-07 (×2): 237 mL via ORAL

## 2018-04-05 MED ORDER — METOPROLOL TARTRATE 12.5 MG HALF TABLET
12.5000 mg | ORAL_TABLET | Freq: Two times a day (BID) | ORAL | Status: DC
  Administered 2018-04-05 – 2018-04-08 (×6): 12.5 mg via ORAL
  Filled 2018-04-05 (×7): qty 1

## 2018-04-05 MED ORDER — SENNA 8.6 MG PO TABS
1.0000 | ORAL_TABLET | Freq: Two times a day (BID) | ORAL | Status: DC
  Administered 2018-04-05 – 2018-04-08 (×6): 8.6 mg via ORAL
  Filled 2018-04-05 (×7): qty 1

## 2018-04-05 MED ORDER — FENTANYL CITRATE (PF) 100 MCG/2ML IJ SOLN
50.0000 ug | Freq: Once | INTRAMUSCULAR | Status: AC
  Administered 2018-04-05: 50 ug via INTRAVENOUS
  Filled 2018-04-05: qty 2

## 2018-04-05 MED ORDER — MIDODRINE HCL 2.5 MG PO TABS
2.5000 mg | ORAL_TABLET | Freq: Two times a day (BID) | ORAL | Status: DC
  Administered 2018-04-05 – 2018-04-07 (×6): 2.5 mg via ORAL
  Filled 2018-04-05 (×8): qty 1

## 2018-04-05 MED ORDER — LORATADINE 10 MG PO TABS
10.0000 mg | ORAL_TABLET | Freq: Every day | ORAL | Status: DC
  Administered 2018-04-05 – 2018-04-08 (×4): 10 mg via ORAL
  Filled 2018-04-05 (×4): qty 1

## 2018-04-05 MED ORDER — SODIUM CHLORIDE 0.9 % IV SOLN
INTRAVENOUS | Status: AC
  Administered 2018-04-05: 04:00:00 via INTRAVENOUS

## 2018-04-05 NOTE — ED Notes (Signed)
O2 sats dropped down to 91%. Patient placed on 2L of O2.

## 2018-04-05 NOTE — Evaluation (Signed)
Physical Therapy Evaluation Patient Details Name: Carl Rios MRN: [id number] DOB: Apr 22, 1926 Today's Date: 04/05/2018   History of Present Illness  Carl Rios  is a 84 y.o. male, w hypertension, hyperlipidemia, tachy/brady CVA, unsteady gait, AAA, PVD, osteoporosis,  apparently presents w recurrent falls and today on CXR had  Pneumonia, chest CT = subacute 6th - 11th rib fxs;  Tspine xray= Mild chronic compression fractures in the thoracic spine without       Clinical Impression  Pt admitted with above diagnosis. Pt currently with functional limitations due to the deficits listed below (see PT Problem List). Eval limited d/t pain, pt requiring total assist to roll and is unable to even elevate  HOB > 10* d/t severe pain; unsure of pt baseline d/t pt is very HOH, no family present--previous notes state pt from ALF; recommend SNF at d/c, may benefit from [facility] care consult as well; Pt will benefit from skilled PT to increase their independence and safety with mobility to allow discharge to the venue listed below.       Follow Up Recommendations SNF;Supervision/Assistance - 24 hour    Equipment Recommendations  None recommended by PT    Recommendations for Other Services       Precautions / Restrictions Precautions Precautions: Fall Precaution Comments: multiple falls per chart (at least 5x in last 2 wks per family) Restrictions Weight Bearing Restrictions: pt has L hand/wrist cast in place--     Mobility  Bed Mobility Overal bed mobility: Needs Assistance Bed Mobility: Rolling Rolling: Total assist         General bed mobility comments: attempted rolling to pt right, unable to perform full turn (3/4 turn) d/t incr pain with mobility; attempted to raise HOB, pt unable d/t complaints of severe pain  Transfers                 General transfer comment: unable d/t pain  Ambulation/Gait             General Gait Details: unable  Stairs             Wheelchair Mobility    Modified Rankin (Stroke Patients Only)       Balance                                             Pertinent Vitals/Pain Pain Assessment: Faces Faces Pain Scale: Hurts worst Pain Location: mid back, ribs Pain Descriptors / Indicators: Moaning;Guarding;Grimacing Pain Intervention(s): Monitored during session;Limited activity within patient's tolerance    Home Living Family/patient expects to be discharged to:: Other (Comment)(pt does not state)                      Prior Function Level of Independence: Needs assistance         Comments: unable to obtain complete and accurate info per pt d/t HOH, no family present; per previous notes pt lives ALF, uses rollator (? if still accurate)     Hand Dominance        Extremity/Trunk Assessment   Upper Extremity Assessment Upper Extremity Assessment: Generalized weakness    Lower Extremity Assessment Lower Extremity Assessment: LLE deficits/detail RLE Sensation: history of peripheral neuropathy LLE Sensation: history of peripheral neuropathy       Communication   Communication: HOH  Cognition Arousal/Alertness: Awake/alert Behavior During Therapy: WFL for tasks assessed/performed  Overall Cognitive Status: Difficult to assess                                        General Comments      Exercises General Exercises - Lower Extremity Ankle Circles/Pumps: AROM;Both;10 reps Heel Slides: AROM;AAROM;Both;10 reps   Assessment/Plan    PT Assessment Patient needs continued PT services  PT Problem List Decreased strength;Decreased activity tolerance;Decreased mobility;Pain       PT Treatment Interventions DME instruction;Functional mobility training;Therapeutic exercise;Patient/family education;Therapeutic activities    PT Goals (Current goals can be found in the Care Plan section)  Acute Rehab PT Goals Patient Stated Goal: less pain PT Goal  Formulation: With patient Time For Goal Achievement: 04/19/18 Potential to Achieve Goals: Fair    Frequency Min 2X/week   Barriers to discharge        Co-evaluation               AM-PAC PT "6 Clicks" Daily Activity  Outcome Measure Difficulty turning over in bed (including adjusting bedclothes, sheets and blankets)?: Unable Difficulty moving from lying on back to sitting on the side of the bed? : Unable Difficulty sitting down on and standing up from a chair with arms (e.g., wheelchair, bedside commode, etc,.)?: Unable Help needed moving to and from a bed to chair (including a wheelchair)?: Total Help needed walking in hospital room?: Total Help needed climbing 3-5 steps with a railing? : Total 6 Click Score: 6    End of Session   Activity Tolerance: Patient limited by pain Patient left: in bed;with call bell/phone within reach;with bed alarm set   PT Visit Diagnosis: Muscle weakness (generalized) (M62.81);Pain Pain - part of body: (back)    Time: 8676-7209 PT Time Calculation (min) (ACUTE ONLY): 17 min   Charges:   PT Evaluation $PT Eval Low Complexity: 1 Low     PT G CodesKenyon Ana, PT Pager: [phone] 04/05/2018   [facility] 04/05/2018, 12:48 PM

## 2018-04-05 NOTE — Progress Notes (Signed)
Patient seen and evaluated earlier this a.m. by my associate.  Please refer to H&P for details regarding assessment and plan.  Plan will be to continue current IV antibiotic regimen.  The patient was seen and evaluated and currently patient has no new complaints.  General: Patient no acute distress cardiovascular: No cyanosis Pulmonary: No increased work of breathing currently.  Equal chest rise  We will plan on reassessing next a.m. or sooner should any new medical concerns arise.  Velvet Bathe, MD

## 2018-04-05 NOTE — Progress Notes (Addendum)
Spoke with Carl Rios and his wife over the phone who are asking to have a neurology consult due to progressive mobility issues and undiagnosed memory loss. Also patient has cast to left arm and was suppose to have it removed this week. Family is interested in seeing if that could be done while he is here in the hospital. Will inform MD.

## 2018-04-05 NOTE — Care Management Note (Signed)
Case Management Note  Patient Details  Name: LINC RENNE MRN: [id number] Date of Birth: 25-Oct-1926  Subjective/Objective:                  Request from md for ltach  Action/Plan: Text send to both ltachs to look at patient  Expected Discharge Date:                  Expected Discharge Plan:     In-House Referral:     Discharge planning Services     Post Acute Care Choice:    Choice offered to:     DME Arranged:    DME Agency:     HH Arranged:    [facility] Agency:     Status of Service:     If discussed at [employer] of Stay Meetings, dates discussed:    Additional Comments:  Leeroy Cha, RN 04/05/2018, 8:15 AM

## 2018-04-05 NOTE — ED Notes (Signed)
Report given to receiving nurse.  RN will call back when low bed has arrived to the receiving department. Patient will be transferred when bed arrives.

## 2018-04-05 NOTE — ED Provider Notes (Signed)
I assumed care of this patient from Dr. Regenia Skeeter at 2330.  Please see their note for further details of Hx, PE.  Briefly patient is a 84 y.o. male who presented with frequent falls resulting in left hand injury, multiple rib fractures.  Patient presented today after a fall.  He was noted to be mildly hypoxic with sats of 90-92 on room air.  Work-up without acute injuries however chest x-ray was suspicious for pneumonia.  Was treated empirically with antibiotics.  Currently pending labs as the patient requires admission.    Labs reassuring.  Case discussed with Dr. Maudie Mercury who admitted the patient.    Fatima Blank, MD 04/05/18 [phone]

## 2018-04-05 NOTE — H&P (Signed)
TRH H&P   Patient Demographics:    Carl Rios, is a 84 y.o. male  MRN: [id number]   DOB - 12/29/1925  Admit Date - 04/04/2018  Outpatient Primary MD for the patient is Marton Redwood, MD  Referring MD/NP/PA:  Arman Filter  Outpatient Specialists:   Patient coming from:  Home/ ALF  Chief Complaint  Patient presents with  . Fall      HPI:    Carl Rios  is a 84 y.o. male, w hypertension, hyperlipidemia, tachy/brady CVA, w unsteady gait, AAA, PVD, osteoporosis,  apparently presents w recurrent falls and today on CXR had ? Pneumonia.   In Ed,  CT chest IMPRESSION: 1. Mild left basilar airspace opacity may reflect atelectasis or pneumonia. 2. Non-healed subacute fractures of the left sixth through eleventh ribs. 3. Diffuse coronary artery calcifications seen. 4. 3.5 cm mildly heterogeneous left thyroid lobe mass noted. Recommend further evaluation with thyroid ultrasound. If patient is clinically hyperthyroid, consider nuclear medicine thyroid uptake and scan. 5. Aneurysmal dilatation of the proximal abdominal aorta to 3.4 cm in AP dimension. Recommend followup by [place] in 3 years.  CT brain/ C spine IMPRESSION: 1.  No acute intracranial abnormality. 2.  No acute cervical spine fracture.  Na 138, K 4.1, Bun 18, creatinine 0.78 Wbc 9.4, Hgb 14.3, Plt 385  Pt will be admitted for recurrent falls and also ? Pneumonia.     Review of systems:    In addition to the HPI above, pt is a poor historian , can't get clear ROS  No Fever-chills, No Headache, No changes with Vision or hearing, No problems swallowing food or Liquids, No Chest pain, Cough or Shortness of Breath, No Abdominal pain, No Nausea or Vommitting, Bowel movements are regular, No Blood in stool or Urine, No dysuria, No new skin rashes or bruises, No new joints pains-aches,  No new  weakness, tingling, numbness in any extremity, No recent weight gain or loss, No polyuria, polydypsia or polyphagia, No significant Mental Stressors.  A full 10 point Review of Systems was done, except as stated above, all other Review of Systems were negative.   With Past History of the following :    Past Medical History:  Diagnosis Date  . AAA (abdominal aortic aneurysm) without rupture ([place])   . CVA (cerebral infarction) 03/2014   right PCA CVA  . Diverticulosis   . Diverticulosis   . Dyslipidemia   . GERD (gastroesophageal reflux disease)   . Hearing difficulty   . Hemorrhoids   . Hiatal hernia 1960  . History of colon polyps   . History of prostatitis   . Hx of adenomatous colonic polyps   . Hyperlipidemia   . Lower GI bleed 12/2011   secondary to diverticulosis  . Macular degeneration   . Obesity   . Peripheral neuropathy   . Peripheral vascular disease (  [place])   . Premature ventricular contraction   . Rhabdomyolysis 03/2014   Due to prolonged fall  . Senile osteoporosis    as per documented on reclast infusion orders  . Tachycardia-bradycardia ([place])   . Unsteady gait       Past Surgical History:  Procedure Laterality Date  . ABDOMINAL AORTIC ANEURYSM REPAIR    . ABDOMINAL AORTIC ENDOVASCULAR STENT GRAFT N/A 08/23/2013   Procedure: ABDOMINAL AORTIC ENDOVASCULAR STENT GRAFT;  Surgeon: Mal Misty, MD;  Location: [facility];  Service: Vascular;  Laterality: N/A;  . CATARACT EXTRACTION Bilateral 07/2009, 09/2009  . COLONOSCOPY  01/24/2012   Procedure: COLONOSCOPY;  Surgeon: Lafayette Dragon, MD;  Location: [facility];  Service: Endoscopy;  Laterality: N/A;  . INGUINAL HERNIA REPAIR    . KNEE SURGERY     left knee in 1970's  . TESTICLE SURGERY  1949   Undescended testicle  . TONSILLECTOMY        Social History:     Social History   Tobacco Use  . Smoking status: Former Smoker    Years: 5.00    Types: Cigarettes    Last attempt to quit: 11/02/1955    Years  since quitting: 62.4  . Smokeless tobacco: Never Used  Substance Use Topics  . Alcohol use: Yes    Alcohol/week: 1.8 oz    Types: 3 Standard drinks or equivalent per week    Comment: 3 drinks per week   (QUIT X 6 MONTHS)     Lives - at ALF  Mobility - unclear   Family History :     Family History  Problem Relation Age of Onset  . Heart disease Father 51       Rheumatic heart  . Alcohol abuse Sister        Home Medications:   Prior to Admission medications   Medication Sig Start Date End Date Taking? Authorizing Provider  acetaminophen (TYLENOL) 325 MG tablet Take 650 mg by mouth every 6 (six) hours as needed.   Yes [provider]  aspirin EC 81 MG EC tablet Take 1 tablet (81 mg total) by mouth daily. 03/29/14  Yes Marton Redwood, MD  DULoxetine (CYMBALTA) 30 MG capsule Take 1 capsule (30 mg total) by mouth daily. 02/19/14  Yes Kathrynn Ducking, MD  loratadine (CLARITIN) 10 MG tablet Take 10 mg by mouth daily.   Yes [provider]  metoprolol tartrate (LOPRESSOR) 25 MG tablet Take 12.5 mg by mouth 2 (two) times daily.  05/22/14  Yes [provider]  midodrine (PROAMATINE) 2.5 MG tablet Take 1 tablet (2.5 mg total) 2 (two) times daily with a meal by mouth. 09/09/17  Yes Georgette Shell, MD  Multiple Vitamins-Minerals (MACULAR VITAMIN BENEFIT) TABS Take 1 tablet by mouth daily.    Yes [provider]  polyethylene glycol (MIRALAX / GLYCOLAX) packet Take 17 g daily as needed by mouth for mild constipation. 09/09/17  Yes Georgette Shell, MD  senna (SENOKOT) 8.6 MG TABS tablet Take 1 tablet (8.6 mg total) 2 (two) times daily by mouth. 09/09/17  Yes Georgette Shell, MD  tamsulosin (FLOMAX) 0.4 MG CAPS capsule Take 0.4 mg daily with supper by mouth. 08/01/17  Yes [provider]  Vitamin D, Ergocalciferol, (DRISDOL) 50000 units CAPS capsule Take 50,000 Units by mouth every 7 (seven) days.   Yes [provider]  cephALEXin  (KEFLEX) 500 MG capsule Take 1 capsule (500 mg total) by mouth 3 (three) times  daily. Patient not taking: Reported on 04/04/2018 03/14/18   Tanna Furry, MD     Allergies:     Allergies  Allergen Reactions  . Sulfamethoxazole Other (See Comments)    UNKNOWN     Physical Exam:   Vitals  Blood pressure 137/83, pulse (!) 31, temperature 97.7 F (36.5 C), temperature source Oral, resp. rate 20, SpO2 91 %.   1. General  lying in bed in NAD,    2. Normal affect and insight, Not Suicidal or Homicidal, Awake Alert, Oriented X 1.  3. No F.N deficits, ALL C.Nerves Intact, Strength 5/5 all 4 extremities, Sensation intact all 4 extremities, Plantars down going.  4. Ears and Eyes appear Normal, Conjunctivae clear, PERRLA. Moist Oral Mucosa.  5. Supple Neck, No JVD, No cervical lymphadenopathy appriciated, No Carotid Bruits.  6. Symmetrical Chest wall movement, Good air movement bilaterally, CTAB.  7. RRR, No Gallops, Rubs or Murmurs, No Parasternal Heave.  8. Positive Bowel Sounds, Abdomen Soft, No tenderness, No organomegaly appriciated,No rebound -guarding or rigidity.  9.  No Cyanosis, Normal Skin Turgor, No Skin Rash or Bruise.  10. Good muscle tone,  joints appear normal , no effusions, Normal ROM.  11. No Palpable Lymph Nodes in Neck or Axillae    Data Review:    CBC Recent Labs  Lab 04/04/18 2330  WBC 9.4  HGB 14.3  HCT 43.2  PLT 385  MCV 91.9  MCH 30.4  MCHC 33.1  RDW 15.2  LYMPHSABS 2.1  MONOABS 0.6  EOSABS 0.1  BASOSABS 0.0   ------------------------------------------------------------------------------------------------------------------  Chemistries  Recent Labs  Lab 04/04/18 2330  NA 138  K 4.1  CL 105  CO2 25  GLUCOSE 115*  BUN 18  CREATININE 0.78  CALCIUM 8.6*   ------------------------------------------------------------------------------------------------------------------ CrCl cannot be calculated (Unknown ideal  weight.). ------------------------------------------------------------------------------------------------------------------ No results for input(s): TSH, T4TOTAL, T3FREE, THYROIDAB in the last 72 hours.  Invalid input(s): FREET3  Coagulation profile No results for input(s): INR, PROTIME in the last 168 hours. ------------------------------------------------------------------------------------------------------------------- No results for input(s): DDIMER in the last 72 hours. -------------------------------------------------------------------------------------------------------------------  Cardiac Enzymes No results for input(s): CKMB, TROPONINI, MYOGLOBIN in the last 168 hours.  Invalid input(s): CK ------------------------------------------------------------------------------------------------------------------    Component Value Date/Time   BNP 64.5 03/19/2018 0423     ---------------------------------------------------------------------------------------------------------------  Urinalysis    Component Value Date/Time   COLORURINE YELLOW 03/14/2018 1918   APPEARANCEUR CLEAR 03/14/2018 1918   LABSPEC 1.019 03/14/2018 1918   PHURINE 6.0 03/14/2018 1918   GLUCOSEU NEGATIVE 03/14/2018 1918   HGBUR NEGATIVE 03/14/2018 1918   BILIRUBINUR NEGATIVE 03/14/2018 1918   KETONESUR 5 (A) 03/14/2018 1918   PROTEINUR NEGATIVE 03/14/2018 1918   UROBILINOGEN 1.0 07/26/2015 1745   NITRITE NEGATIVE 03/14/2018 1918   LEUKOCYTESUR NEGATIVE 03/14/2018 1918    ----------------------------------------------------------------------------------------------------------------   Imaging Results:    Dg Chest 1 View  Result Date: 04/04/2018 CLINICAL DATA:  Fall getting out of bed. EXAM: CHEST  1 VIEW COMPARISON:  Radiographs 03/19/2018, additional priors FINDINGS: Low lung volumes. Unchanged heart size and mediastinal contours with tortuous thoracic aorta. Ill-defined left basilar opacity may be  atelectasis or small pleural effusion. Minimal right infrahilar atelectasis. No pneumothorax. Multiple left rib fractures, as seen on prior exams. IMPRESSION: 1. Low lung volumes. Ill-defined left basilar opacity may be combination of atelectasis and pleural fluid. 2. Tortuous atherosclerotic aorta, unchanged. 3. Left rib fractures, subacute or chronic, as seen on prior exams. Electronically Signed   By: Jeb Levering M.D.   On:  04/04/2018 21:32   Dg Thoracic Spine W/swimmers  Result Date: 04/04/2018 CLINICAL DATA:  Thoracolumbar back pain after fall attempting to get out of bed. EXAM: THORACIC SPINE - 3 VIEWS COMPARISON:  Radiographs 2 weeks prior 03/19/2018 FINDINGS: Lateral view slightly limited by cross-table technique. Chronic loss of height of mid and lower thoracic vertebral bodies, tentatively T11 and T6, unchanged from prior exam. No evidence of acute fracture. No paravertebral soft tissue abnormality to suggest fracture. IMPRESSION: Mild chronic compression fractures in the thoracic spine without evidence of acute abnormality. Electronically Signed   By: Jeb Levering M.D.   On: 04/04/2018 21:26   Dg Lumbar Spine Complete  Result Date: 04/04/2018 CLINICAL DATA:  Thoracolumbar back pain after fall out of bed. EXAM: LUMBAR SPINE - COMPLETE 4+ VIEW COMPARISON:  Radiographs 2 weeks prior 03/19/2018 FINDINGS: Chronic compression fractures of T11, T12, L2 and L3. No progression from prior. No acute fracture. Facet arthropathy of the lower lumbar spine. Similar degenerative disc disease. Bones are under mineralized. Aorto bi-iliac stent graft in place. IMPRESSION: Chronic compression fractures in the lumbar spine without progression or acute abnormality. Electronically Signed   By: Jeb Levering M.D.   On: 04/04/2018 21:28   Ct Head Wo Contrast  Result Date: 04/04/2018 CLINICAL DATA:  Fall. EXAM: CT HEAD WITHOUT CONTRAST CT CERVICAL SPINE WITHOUT CONTRAST TECHNIQUE: Multidetector CT imaging of  the head and cervical spine was performed following the standard protocol without intravenous contrast. Multiplanar CT image reconstructions of the cervical spine were also generated. COMPARISON:  CT head and cervical spine dated Mar 19, 2018. FINDINGS: CT HEAD FINDINGS Brain: No evidence of acute infarction, hemorrhage, hydrocephalus, extra-axial collection or mass lesion/mass effect. Stable moderate cerebral atrophy and severe chronic microvascular ischemic changes. Unchanged lacunar infarcts in the bilateral thalami and left corona radiata. Unchanged right occipital lobe encephalomalacia. Vascular: Calcified atherosclerosis at the skullbase. No hyperdense vessel. Skull: Negative for fracture. Sinuses/Orbits: No acute finding. Other: None. CT CERVICAL SPINE FINDINGS Alignment: Normal. Skull base and vertebrae: No acute fracture. No primary bone lesion or focal pathologic process. Unchanged chronic T3 compression deformity. Soft tissues and spinal canal: No prevertebral fluid or swelling. No visible canal hematoma. Disc levels: Moderate multilevel degenerative disc disease and facet arthropathy throughout the cervical spine. Upper chest: Negative. Other: Unchanged heterogeneous thyroid gland. IMPRESSION: 1.  No acute intracranial abnormality. 2.  No acute cervical spine fracture. Electronically Signed   By: Titus Dubin M.D.   On: 04/04/2018 22:06   Ct Chest Wo Contrast  Result Date: 04/04/2018 CLINICAL DATA:  Assess for pneumonia. Shortness of breath. EXAM: CT CHEST WITHOUT CONTRAST TECHNIQUE: Multidetector CT imaging of the chest was performed following the standard protocol without IV contrast. COMPARISON:  Chest radiograph performed earlier today at 9:07 p.m., and CT of the chest performed 09/09/2014 FINDINGS: Cardiovascular: The heart is normal in size. Diffuse coronary artery calcifications are seen. Scattered calcification is noted along the thoracic aorta. The great vessels are grossly unremarkable,  though not well assessed without contrast. Mediastinum/Nodes: The mediastinum is grossly unremarkable. No mediastinal lymphadenopathy is seen. No pericardial effusion is identified. A 3.5 cm mildly heterogeneous left thyroid lobe mass is noted. No axillary lymphadenopathy is seen. Lungs/Pleura: The mild left basilar airspace opacity may reflect atelectasis or pneumonia. No pleural effusion or pneumothorax is seen. No dominant mass is seen. Mild bronchomalacia is noted. Upper Abdomen: The visualized portions of the liver and spleen are unremarkable. The visualized portions of the adrenal glands and kidneys are  within normal limits. There is aneurysmal dilatation of the proximal abdominal aorta to 3.4 cm in AP dimension. Musculoskeletal: There are non-healed displaced subacute fractures of the left sixth through eleventh ribs. There is mild chronic loss of height at vertebral bodies T3, T6, T11 and T12. The visualized musculature is unremarkable in appearance. IMPRESSION: 1. Mild left basilar airspace opacity may reflect atelectasis or pneumonia. 2. Non-healed subacute fractures of the left sixth through eleventh ribs. 3. Diffuse coronary artery calcifications seen. 4. 3.5 cm mildly heterogeneous left thyroid lobe mass noted. Recommend further evaluation with thyroid ultrasound. If patient is clinically hyperthyroid, consider nuclear medicine thyroid uptake and scan. 5. Aneurysmal dilatation of the proximal abdominal aorta to 3.4 cm in AP dimension. Recommend followup by [place] in 3 years. This recommendation follows ACR consensus guidelines: White Paper of the ACR Incidental Findings Committee II on Vascular Findings. J Am Coll Radiol 2013; 10:789-794 Aortic Atherosclerosis (ICD10-I70.0). Electronically Signed   By: Garald Balding M.D.   On: 04/04/2018 23:26   Ct Cervical Spine Wo Contrast  Result Date: 04/04/2018 CLINICAL DATA:  Fall. EXAM: CT HEAD WITHOUT CONTRAST CT CERVICAL SPINE WITHOUT CONTRAST TECHNIQUE:  Multidetector CT imaging of the head and cervical spine was performed following the standard protocol without intravenous contrast. Multiplanar CT image reconstructions of the cervical spine were also generated. COMPARISON:  CT head and cervical spine dated Mar 19, 2018. FINDINGS: CT HEAD FINDINGS Brain: No evidence of acute infarction, hemorrhage, hydrocephalus, extra-axial collection or mass lesion/mass effect. Stable moderate cerebral atrophy and severe chronic microvascular ischemic changes. Unchanged lacunar infarcts in the bilateral thalami and left corona radiata. Unchanged right occipital lobe encephalomalacia. Vascular: Calcified atherosclerosis at the skullbase. No hyperdense vessel. Skull: Negative for fracture. Sinuses/Orbits: No acute finding. Other: None. CT CERVICAL SPINE FINDINGS Alignment: Normal. Skull base and vertebrae: No acute fracture. No primary bone lesion or focal pathologic process. Unchanged chronic T3 compression deformity. Soft tissues and spinal canal: No prevertebral fluid or swelling. No visible canal hematoma. Disc levels: Moderate multilevel degenerative disc disease and facet arthropathy throughout the cervical spine. Upper chest: Negative. Other: Unchanged heterogeneous thyroid gland. IMPRESSION: 1.  No acute intracranial abnormality. 2.  No acute cervical spine fracture. Electronically Signed   By: Titus Dubin M.D.   On: 04/04/2018 22:06       Assessment & Plan:    Principal Problem:   CAP (community acquired pneumonia) Active Problems:   Hypertension   Recurrent falls PT/OT to evaluate and tx  CAP Blood culture x2 Sputum gram stain/ culture Urine strep Urine legionella antigen Rocephin 1gm iv qday Zithromax 500mg  iv qday  Hypertension Cont Lopressor 12.5mg  po bid  CVA Cont aspirin Check lipid  Bph Cont Flomax  Vitamin D def Cont vitamin D 50,000 IU po qweek     DVT Prophylaxis Lovenox - SCDs  AM Labs Ordered, also please review Full  Orders  Family Communication: Admission, patients condition and plan of care including tests being ordered have been discussed with the patient  who indicate understanding and agree with the plan and Code Status.  Code Status FULL CODE  Likely DC to  home  Condition GUARDED    Consults called:  none  Admission status: inpatient  Time spent in minutes : 45   Jani Gravel M.D on 04/05/2018 at 2:47 AM  Between 7am to 7pm - Pager - [phone]  . After 7pm go to [email or web address] - password [email or web address]  [facility] - Office  [phone]

## 2018-04-05 NOTE — Evaluation (Signed)
Occupational Therapy Evaluation Patient Details Name: Carl Rios MRN: [id number] DOB: [birth year] Today's Date: 04/05/2018    History of Present Illness Carl Rios  is a 84 y.o. male, w hypertension, hyperlipidemia, tachy/brady CVA, unsteady gait, AAA, PVD, osteoporosis,  apparently presents w recurrent falls and today on CXR had  Pneumonia, chest CT = subacute 6th - 11th rib fxs;  Tspine xray= Mild chronic compression fractures in the thoracic spine without    Pt's LUE splinted due to 2nd and 3rd PIP dislocation   Clinical Impression   Pt was admitted for the above.  He is from ALF, per chart. Unsure of PLOF.  Pt needed total +2 for lateral scoot transfer to bed from drop arm recliner.  He can perform grooming and self feeding with set up, UB adls with min A and LB ADLs with max to total +2 assist. Will follow in acute setting with the goals below    Follow Up Recommendations  SNF    Equipment Possibly drop arm 3:1 commode  Recommendations for Other Services       Precautions / Restrictions Precautions Precautions: Fall Precaution Comments: multiple falls per chart (5xs in last 2 wks)  Required Braces or Orthoses: Other Brace/Splint Other Brace/Splint: pt has L hand/wrist splint in place  Restrictions Weight Bearing Restrictions: No Other Position/Activity Restrictions: LUE casted due to 2nd 3rd PIP dislocation:  kept NWB      Mobility Bed Mobility Overal bed mobility: Needs Assistance Bed Mobility: Rolling Rolling: Total assist     Sit to supine: +2 for safety/equipment;Total assist   General bed mobility comments: pt laid back straight when transferred to EOB.  Assist to turn towards [facility]  Transfers Overall transfer level: Needs assistance   Transfers: Lateral/Scoot Transfers          Lateral/Scoot Transfers: Total assist;+2 physical assistance General transfer comment: lateral scoot using chux sheet for assistance    Balance Overall balance assessment:  History of Falls;Needs assistance     Sitting balance - Comments: L lateral lean in chair and posterior lean when unsupported                                   ADL either performed or assessed with clinical judgement   ADL Overall ADL's : Needs assistance/impaired Eating/Feeding: Set up;Sitting Eating/Feeding Details (indicate cue type and reason): pt attempted to open salad dressing; tried to hold with bil hands (L in cast) and use teeth Grooming: Wash/dry face;Wash/dry hands;Set up;Sitting   Upper Body Bathing: Minimal assistance;Sitting   Lower Body Bathing: Maximal assistance;Sit to/from stand;+2 for physical assistance   Upper Body Dressing : Moderate assistance;Sitting   Lower Body Dressing: Total assistance;Sit to/from stand;+2 for physical assistance   Toilet Transfer: Total assistance;+2 for physical assistance(lateral pivot to bed from drop arm recliner)             General ADL Comments: pt was up in chair eating lunch when I arrived. After assessment, NT and extern came to assist with transfer back to bed.  Encouraged NWB on LUE.      Vision         Perception     Praxis      Pertinent Vitals/Pain Pain Assessment: Faces Faces Pain Scale: Hurts even more Pain Location: back Pain Descriptors / Indicators: Aching Pain Intervention(s): Limited activity within patient's tolerance;Monitored during session;Repositioned;Patient requesting pain meds-RN notified     Hand  Dominance Right   Extremity/Trunk Assessment Upper Extremity Assessment Upper Extremity Assessment: Generalized weakness;LUE deficits/detail LUE Deficits / Details: casted wrist.  Moves bil UEs; LUE AAROM tight at end range, approximately 70 degrees   Lower Extremity Assessment Lower Extremity Assessment: LLE deficits/detail RLE Sensation: history of peripheral neuropathy LLE Sensation: history of peripheral neuropathy       Communication Communication Communication: HOH    Cognition Arousal/Alertness: Awake/alert Behavior During Therapy: WFL for tasks assessed/performed Overall Cognitive Status: Difficult to assess                                 General Comments: periods of confusion per nursing.  Pt is VERY HOH. 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 if still accurate)        OT Problem List: Decreased strength;Decreased activity tolerance;Impaired balance (sitting and/or standing);Decreased coordination;Impaired UE functional use;Pain;Decreased knowledge of use of DME or AE      OT Treatment/Interventions: Self-care/ADL training;Therapeutic exercise;DME and/or AE instruction;Balance training;Patient/family education;Therapeutic activities    OT Goals(Current goals can be found in the care plan section) Acute Rehab OT Goals Patient Stated Goal: less pain OT Goal Formulation: Patient unable to participate in goal setting Time For Goal Achievement: 04/19/18 Potential to Achieve Goals: Fair ADL Goals Pt Will Transfer to Toilet: with max assist;bedside commode;with +2 assist;squat pivot transfer;stand pivot transfer;with transfer board Additional ADL Goal #1: pt will lean side to side vs sit to stand with mod +2 for hygiene Additional ADL Goal #2: pt will perform bed mobility with max +2 assistance in preparation for adls  OT Frequency: Min 2X/week   Barriers to D/C:            Co-evaluation               AM-PAC PT "6 Clicks" Daily Activity     Outcome Measure Help from another person eating meals?: A Little Help from another person taking care of personal grooming?: A Little Help from another person toileting, which includes using toliet, bedpan, or urinal?: A Lot Help from another person bathing (including washing, rinsing, drying)?: A Lot Help from another person to put on and taking off regular upper body clothing?: A Lot Help from another person to put on and taking off regular lower body clothing?: Total 6 Click Score: 13   End of Session    Activity Tolerance: Patient tolerated treatment well Patient left: in bed;with call bell/phone within reach;with bed alarm set;with nursing/sitter in room  OT Visit Diagnosis: Repeated falls (R29.6);Muscle weakness (generalized) (M62.81)                Time: 4098-1191 OT Time Calculation (min): 41 min Charges:  OT General Charges $OT Visit: 1 Visit OT Evaluation $OT Eval Moderate Complexity: 1 Mod OT Treatments $Self Care/Home Management : 23-37 mins G-Codes:     [place], OTR/L [phone] 04/05/2018  Corazon Nickolas 04/05/2018, 3:20 PM

## 2018-04-05 NOTE — Progress Notes (Signed)
Nurse notified  

## 2018-04-06 LAB — LEGIONELLA PNEUMOPHILA SEROGP 1 UR AG: L. pneumophila Serogp 1 Ur Ag: NEGATIVE

## 2018-04-06 NOTE — Progress Notes (Signed)
Initial Nutrition Assessment  DOCUMENTATION CODES:   Not applicable  INTERVENTION:    Ensure Enlive po BID, each supplement provides 350 kcal and 20 grams of protein  Magic cup TID with meals, each supplement provides 290 kcal and 9 grams of protein  NUTRITION DIAGNOSIS:   Increased nutrient needs related to acute illness as evidenced by estimated needs.  GOAL:   Patient will meet greater than or equal to 90% of their needs  MONITOR:   PO intake, Weight trends, Supplement acceptance, Labs  REASON FOR ASSESSMENT:   Malnutrition Screening Tool   ASSESSMENT:   Patient with PMH significant for HTN, HLD, CVA, AAA, PVD, and osteoporosis. Presents this admission with recurrent falls and questionable PNA.    Pt very hard of hearing.  States his appetite hasn't been great for the last few weeks but he was unable to elaborate.  Denies any issue with swallowing, nausea, or vomiting.  Pt had breakfast tray at bedside with eggs, toast, and milk.  RD observed about 50% completion.  Pt denies any recent wt loss.  Records indicate pt weighed 184 lb 09/09/17 and 174 lb this admission (5% in 8 months, insignificant for time frame).  Nutrition-Focused physical exam completed.  Medications reviewed and include: MVI, Vitamin D Labs reviewed.   NUTRITION - FOCUSED PHYSICAL EXAM:    Most Recent Value  Orbital Region  No depletion  Upper Arm Region  Mild depletion  Thoracic and Lumbar Region  Unable to assess  Buccal Region  No depletion  Temple Region  Mild depletion  Clavicle Bone Region  Mild depletion  Clavicle and Acromion Bone Region  Mild depletion  Scapular Bone Region  Unable to assess  Dorsal Hand  Mild depletion  Patellar Region  Unable to assess  Anterior Thigh Region  Unable to assess  Posterior Calf Region  Unable to assess  Edema (RD Assessment)  Unable to assess  Hair  Reviewed  Eyes  Reviewed  Mouth  Reviewed  Skin  Reviewed  Nails  Reviewed     Diet Order:    Diet Order           Diet Heart Room service appropriate? Yes; Fluid consistency: Thin  Diet effective now          EDUCATION NEEDS:   Not appropriate for education at this time  Skin:  Skin Assessment: Reviewed RN Assessment  Last BM:  04/06/18  Height:   Ht Readings from Last 1 Encounters:  04/06/18 6\' 1"  (1.854 m)    Weight:   Wt Readings from Last 1 Encounters:  04/06/18 174 lb (78.9 kg)    Ideal Body Weight:  83.6 kg  BMI:  Body mass index is 22.96 kg/m.  Estimated Nutritional Needs:   Kcal:  2000-2200 kcal  Protein:  100-110 g  Fluid:  >2 L/day    Mariana Single RD, LDN Clinical Nutrition Pager # [phone]

## 2018-04-06 NOTE — NC FL2 (Signed)
[place] LEVEL OF CARE SCREENING TOOL     IDENTIFICATION  Patient Name: Carl Rios Birthdate: December 23, 1925 Sex: male Admission Date (Current Location): 04/04/2018  [facility] and [place] Number:  Herbalist and Address:  [facility],  [place] [address] [place]      Provider Number: [id number]  Attending Physician Name and Address:  Velvet Bathe, MD  Relative Name and Phone Number:       Current Level of Care: Hospital Recommended Level of Care: [place] Prior Approval Number:    Date Approved/Denied:   PASRR Number: [id number]  Discharge Plan: SNF    Current Diagnoses: Patient Active Problem List   Diagnosis Date Noted  . CAP (community acquired pneumonia) 04/05/2018  . Fall   . Paroxysmal atrial tachycardia ([place])   . Orthostasis 10/16/2017  . Gait difficulty 10/16/2017  . History of CVA (cerebrovascular accident) 10/16/2017  . Pressure injury of skin 09/09/2017  . Syncope and collapse 09/08/2017  . PVC's (premature ventricular contractions) 09/23/2014  . Bradycardia 09/23/2014  . Depression 04/17/2014  . Recurrent falls 04/01/2014  . Paroxysmal SVT (supraventricular tachycardia) ([place]) 04/01/2014  . Unsteady gait 04/01/2014  . CVA (cerebral infarction) 03/31/2014  . Acute renal failure ([place]) 03/27/2014  . Dehydration 03/27/2014  . Rhabdomyolysis 03/26/2014  . AAA (abdominal aortic aneurysm) without rupture ([place]) 08/14/2013  . Abnormality of gait 01/08/2013  . Other general symptoms(780.99) 01/08/2013  . Other vitamin B12 deficiency anemia 01/08/2013  . Polyneuropathy in other diseases classified elsewhere ([place]) 01/08/2013  . Abdominal aneurysm without mention of rupture 02/01/2012  . Hypokalemia 01/25/2012  . Anemia due to acute blood loss 01/24/2012  . Benign neoplasm of colon 01/24/2012  . Hypertension 01/19/2012  . Other and unspecified hyperlipidemia 01/18/2012  . Impaired fasting glucose  01/18/2012  . Unspecified hereditary and idiopathic peripheral neuropathy 01/18/2012  . Hypertonicity of bladder 01/18/2012  . Diverticulosis 01/18/2012  . Rectal bleeding 01/18/2012  . GERD 02/09/2010  . COLONIC POLYPS, ADENOMATOUS, HX OF 02/09/2010    Orientation RESPIRATION BLADDER Height & Weight     Self  O2 Incontinent, External catheter Weight: 174 lb (78.9 kg) Height:  6\' 1"  (185.4 cm)  BEHAVIORAL SYMPTOMS/MOOD NEUROLOGICAL BOWEL NUTRITION STATUS      Incontinent Diet(See dc summary)  AMBULATORY STATUS COMMUNICATION OF NEEDS Skin   Extensive Assist Verbally Normal                       Personal Care Assistance Level of Assistance  Bathing, Feeding, Dressing Bathing Assistance: Limited assistance Feeding assistance: Limited assistance Dressing Assistance: Limited assistance     Functional Limitations Info  Sight, Hearing, Speech Sight Info: Impaired Hearing Info: Impaired Speech Info: Adequate    SPECIAL CARE FACTORS FREQUENCY  PT (By licensed PT), OT (By licensed OT)     PT Frequency: 5x/week OT Frequency: 5x/week            Contractures Contractures Info: Not present    Additional Factors Info  Code Status, Allergies Code Status Info: Full Allergies Info: Sulfamethoxazole           Current Medications (04/06/2018):  This is the current hospital active medication list Current Facility-Administered Medications  Medication Dose Route Frequency Provider Last Rate Last Dose  . aspirin EC tablet 81 mg  81 mg Oral Daily Jani Gravel, MD   81 mg at 04/06/18 1327  . azithromycin (ZITHROMAX) 500 mg in sodium chloride 0.9 %  250 mL IVPB  500 mg Intravenous Q24H Jani Gravel, MD   Stopped at 04/05/18 2211  . cefTRIAXone (ROCEPHIN) 1 g in sodium chloride 0.9 % 100 mL IVPB  1 g Intravenous Q24H Jani Gravel, MD   Stopped at 04/05/18 2141  . DULoxetine (CYMBALTA) DR capsule 30 mg  30 mg Oral Daily Jani Gravel, MD   30 mg at 04/06/18 1327  . enoxaparin (LOVENOX)  injection 40 mg  40 mg Subcutaneous Q24H Jani Gravel, MD   40 mg at 04/06/18 1327  . feeding supplement (ENSURE ENLIVE) (ENSURE ENLIVE) liquid 237 mL  237 mL Oral BID BM Velvet Bathe, MD      . loratadine (CLARITIN) tablet 10 mg  10 mg Oral Daily Jani Gravel, MD   10 mg at 04/06/18 1328  . MEDLINE mouth rinse  15 mL Mouth Rinse BID Velvet Bathe, MD   15 mL at 04/06/18 1328  . metoprolol tartrate (LOPRESSOR) tablet 12.5 mg  12.5 mg Oral BID Jani Gravel, MD   12.5 mg at 04/06/18 1329  . midodrine (PROAMATINE) tablet 2.5 mg  2.5 mg Oral BID WC Jani Gravel, MD   2.5 mg at 04/06/18 1329  . multivitamin (PROSIGHT) tablet 1 tablet  1 tablet Oral Daily Jani Gravel, MD   1 tablet at 04/06/18 1329  . polyethylene glycol (MIRALAX / GLYCOLAX) packet 17 g  17 g Oral Daily PRN Jani Gravel, MD      . senna [facility]) tablet 8.6 mg  1 tablet Oral BID Jani Gravel, MD   8.6 mg at 04/06/18 1330  . tamsulosin (FLOMAX) capsule 0.4 mg  0.4 mg Oral Q supper Jani Gravel, MD   0.4 mg at 04/05/18 1828  . Vitamin D (Ergocalciferol) (DRISDOL) capsule 50,000 Units  50,000 Units Oral Q7 days Jani Gravel, MD   50,000 Units at 04/05/18 9767     Discharge Medications: Please see discharge summary for a list of discharge medications.  Relevant Imaging Results:  Relevant Lab Results:   Additional Information SS#: [id number]  Servando Snare, LCSW

## 2018-04-06 NOTE — Progress Notes (Addendum)
PROGRESS NOTE    Carl Rios  OIZ:[id number] DOB: [birth year] DOA: 04/04/2018 PCP: Marton Redwood, MD    Brief Narrative:  84 y.o. male, w hypertension, hyperlipidemia, tachy/brady CVA, w unsteady gait, AAA, PVD, osteoporosis,  apparently presents w recurrent falls and today on CXR had ? Pneumonia.  Assessment & Plan:   Principal Problem:   CAP (community acquired pneumonia) -Continue supplemental oxygen as needed.  We will try to wean off of supplemental oxygen as tolerated -continue current antibiotic regimen  Frequent falls -Could have been secondary to new onset pneumonia.  Will obtain physical therapy evaluation - obtain orthostatics  Active Problems:   Hypertension - Pt on lopressor  BPH - stable on flomax   DVT prophylaxis: Lovenox Code Status: Full Family Communication: none at bedside. Disposition Plan: expect d/c for next am.  Consultants:   none   Procedures: none  Antimicrobials: Azithromycin, and Rocephin  Subjective: Pt has no new complaints. No acute issues overnight.   Objective: Vitals:   04/05/18 2027 04/06/18 0446 04/06/18 1019 04/06/18 1431  BP: (!) 145/91 (!) 145/84  (!) 162/94  Pulse: 79 84  77  Resp: 20 18    Temp: (!) 97.5 F (36.4 C) 98.1 F (36.7 C)  97.8 F (36.6 C)  TempSrc: Oral Axillary  Oral  SpO2: 95% 96%  99%  Weight:   78.9 kg (174 lb)   Height:   6\' 1"  (1.854 m)     Intake/Output Summary (Last 24 hours) at 04/06/2018 1455 Last data filed at 04/06/2018 0705 Gross per 24 hour  Intake 120 ml  Output -  Net 120 ml   Filed Weights   04/06/18 1019  Weight: 78.9 kg (174 lb)    Examination:  General exam: Appears calm and comfortable, in nad Respiratory system: equal chest rise, [place] in place, no wheezes,  Cardiovascular system: S1 & S2 heard, RRR. No JVD, murmurs, rubs, Gastrointestinal system: Abdomen is nondistended, soft and nontender. No organomegaly or masses felt. Normal bowel sounds heard. Central nervous  system: pleasantly confused, hard of hearing, moves extremities equally.  No facial asymmetry Extremities: Warm, no cyanosis Skin: No rashes, lesions or ulcers Psychiatry: Impaired judgement and insight. Mood & affect appropriate.     Data Reviewed: I have personally reviewed following labs and imaging studies  CBC: Recent Labs  Lab 04/04/18 2330  WBC 9.4  NEUTROABS 6.6  HGB 14.3  HCT 43.2  MCV 91.9  PLT 338   Basic Metabolic Panel: Recent Labs  Lab 04/04/18 2330  NA 138  K 4.1  CL 105  CO2 25  GLUCOSE 115*  BUN 18  CREATININE 0.78  CALCIUM 8.6*   GFR: Estimated Creatinine Clearance: 68.5 mL/min (by C-G formula based on SCr of 0.78 mg/dL). Liver Function Tests: No results for input(s): AST, ALT, ALKPHOS, BILITOT, PROT, ALBUMIN in the last 168 hours. No results for input(s): LIPASE, AMYLASE in the last 168 hours. No results for input(s): AMMONIA in the last 168 hours. Coagulation Profile: No results for input(s): INR, PROTIME in the last 168 hours. Cardiac Enzymes: No results for input(s): CKTOTAL, CKMB, CKMBINDEX, TROPONINI in the last 168 hours. BNP (last 3 results) No results for input(s): PROBNP in the last 8760 hours. HbA1C: No results for input(s): HGBA1C in the last 72 hours. CBG: No results for input(s): GLUCAP in the last 168 hours. Lipid Profile: No results for input(s): CHOL, HDL, LDLCALC, TRIG, CHOLHDL, LDLDIRECT in the last 72 hours. Thyroid Function Tests: No results for  input(s): TSH, T4TOTAL, FREET4, T3FREE, THYROIDAB in the last 72 hours. Anemia Panel: No results for input(s): VITAMINB12, FOLATE, FERRITIN, TIBC, IRON, RETICCTPCT in the last 72 hours. Sepsis Labs: No results for input(s): PROCALCITON, LATICACIDVEN in the last 168 hours.  Recent Results (from the past 240 hour(s))  Culture, blood (routine x 2)     Status: None (Preliminary result)   Collection Time: 04/05/18 12:46 AM  Result Value Ref Range Status   Specimen Description  BLOOD RIGHT WRIST  Final   Special Requests   Final    BOTTLES DRAWN AEROBIC AND ANAEROBIC Blood Culture results may not be optimal due to an excessive volume of blood received in culture bottles Performed at [facility] [address] [place], [place] [address]    Culture   Final    NO GROWTH 1 DAY Performed at [facility], [place] [address] [place], [place] [address]    Report Status PENDING  Incomplete  Culture, blood (routine x 2)     Status: None (Preliminary result)   Collection Time: 04/05/18 12:56 AM  Result Value Ref Range Status   Specimen Description   Final    BLOOD RIGHT ANTECUBITAL Performed at [facility] [address], [place], [place] [address]    Special Requests   Final    BOTTLES DRAWN AEROBIC AND ANAEROBIC Blood Culture adequate volume Performed at [facility] [address] [place], [place] [address]    Culture   Final    NO GROWTH 1 DAY Performed at [facility], [place] [address] [place], [place] [address]    Report Status PENDING  Incomplete         Radiology Studies: Dg Chest 1 View  Result Date: 04/04/2018 CLINICAL DATA:  Fall getting out of bed. EXAM: CHEST  1 VIEW COMPARISON:  Radiographs 03/19/2018, additional priors FINDINGS: Low lung volumes. Unchanged heart size and mediastinal contours with tortuous thoracic aorta. Ill-defined left basilar opacity may be atelectasis or small pleural effusion. Minimal right infrahilar atelectasis. No pneumothorax. Multiple left rib fractures, as seen on prior exams. IMPRESSION: 1. Low lung volumes. Ill-defined left basilar opacity may be combination of atelectasis and pleural fluid. 2. Tortuous atherosclerotic aorta, unchanged. 3. Left rib fractures, subacute or chronic, as seen on prior exams. Electronically Signed   By: Jeb Levering M.D.   On: 04/04/2018 21:32   Dg Thoracic Spine W/swimmers  Result Date: 04/04/2018 CLINICAL DATA:   Thoracolumbar back pain after fall attempting to get out of bed. EXAM: THORACIC SPINE - 3 VIEWS COMPARISON:  Radiographs 2 weeks prior 03/19/2018 FINDINGS: Lateral view slightly limited by cross-table technique. Chronic loss of height of mid and lower thoracic vertebral bodies, tentatively T11 and T6, unchanged from prior exam. No evidence of acute fracture. No paravertebral soft tissue abnormality to suggest fracture. IMPRESSION: Mild chronic compression fractures in the thoracic spine without evidence of acute abnormality. Electronically Signed   By: Jeb Levering M.D.   On: 04/04/2018 21:26   Dg Lumbar Spine Complete  Result Date: 04/04/2018 CLINICAL DATA:  Thoracolumbar back pain after fall out of bed. EXAM: LUMBAR SPINE - COMPLETE 4+ VIEW COMPARISON:  Radiographs 2 weeks prior 03/19/2018 FINDINGS: Chronic compression fractures of T11, T12, L2 and L3. No progression from prior. No acute fracture. Facet arthropathy of the lower lumbar spine. Similar degenerative disc disease. Bones are under mineralized. Aorto bi-iliac stent graft in place. IMPRESSION: Chronic compression fractures in the lumbar spine  without progression or acute abnormality. Electronically Signed   By: Jeb Levering M.D.   On: 04/04/2018 21:28   Ct Head Wo Contrast  Result Date: 04/04/2018 CLINICAL DATA:  Fall. EXAM: CT HEAD WITHOUT CONTRAST CT CERVICAL SPINE WITHOUT CONTRAST TECHNIQUE: Multidetector CT imaging of the head and cervical spine was performed following the standard protocol without intravenous contrast. Multiplanar CT image reconstructions of the cervical spine were also generated. COMPARISON:  CT head and cervical spine dated Mar 19, 2018. FINDINGS: CT HEAD FINDINGS Brain: No evidence of acute infarction, hemorrhage, hydrocephalus, extra-axial collection or mass lesion/mass effect. Stable moderate cerebral atrophy and severe chronic microvascular ischemic changes. Unchanged lacunar infarcts in the bilateral thalami and  left corona radiata. Unchanged right occipital lobe encephalomalacia. Vascular: Calcified atherosclerosis at the skullbase. No hyperdense vessel. Skull: Negative for fracture. Sinuses/Orbits: No acute finding. Other: None. CT CERVICAL SPINE FINDINGS Alignment: Normal. Skull base and vertebrae: No acute fracture. No primary bone lesion or focal pathologic process. Unchanged chronic T3 compression deformity. Soft tissues and spinal canal: No prevertebral fluid or swelling. No visible canal hematoma. Disc levels: Moderate multilevel degenerative disc disease and facet arthropathy throughout the cervical spine. Upper chest: Negative. Other: Unchanged heterogeneous thyroid gland. IMPRESSION: 1.  No acute intracranial abnormality. 2.  No acute cervical spine fracture. Electronically Signed   By: Titus Dubin M.D.   On: 04/04/2018 22:06   Ct Chest Wo Contrast  Result Date: 04/04/2018 CLINICAL DATA:  Assess for pneumonia. Shortness of breath. EXAM: CT CHEST WITHOUT CONTRAST TECHNIQUE: Multidetector CT imaging of the chest was performed following the standard protocol without IV contrast. COMPARISON:  Chest radiograph performed earlier today at 9:07 p.m., and CT of the chest performed 09/09/2014 FINDINGS: Cardiovascular: The heart is normal in size. Diffuse coronary artery calcifications are seen. Scattered calcification is noted along the thoracic aorta. The great vessels are grossly unremarkable, though not well assessed without contrast. Mediastinum/Nodes: The mediastinum is grossly unremarkable. No mediastinal lymphadenopathy is seen. No pericardial effusion is identified. A 3.5 cm mildly heterogeneous left thyroid lobe mass is noted. No axillary lymphadenopathy is seen. Lungs/Pleura: The mild left basilar airspace opacity may reflect atelectasis or pneumonia. No pleural effusion or pneumothorax is seen. No dominant mass is seen. Mild bronchomalacia is noted. Upper Abdomen: The visualized portions of the liver and  spleen are unremarkable. The visualized portions of the adrenal glands and kidneys are within normal limits. There is aneurysmal dilatation of the proximal abdominal aorta to 3.4 cm in AP dimension. Musculoskeletal: There are non-healed displaced subacute fractures of the left sixth through eleventh ribs. There is mild chronic loss of height at vertebral bodies T3, T6, T11 and T12. The visualized musculature is unremarkable in appearance. IMPRESSION: 1. Mild left basilar airspace opacity may reflect atelectasis or pneumonia. 2. Non-healed subacute fractures of the left sixth through eleventh ribs. 3. Diffuse coronary artery calcifications seen. 4. 3.5 cm mildly heterogeneous left thyroid lobe mass noted. Recommend further evaluation with thyroid ultrasound. If patient is clinically hyperthyroid, consider nuclear medicine thyroid uptake and scan. 5. Aneurysmal dilatation of the proximal abdominal aorta to 3.4 cm in AP dimension. Recommend followup by [place] in 3 years. This recommendation follows ACR consensus guidelines: White Paper of the ACR Incidental Findings Committee II on Vascular Findings. J Am Coll Radiol 2013; 10:789-794 Aortic Atherosclerosis (ICD10-I70.0). Electronically Signed   By: Garald Balding M.D.   On: 04/04/2018 23:26   Ct Cervical Spine Wo Contrast  Result Date: 04/04/2018 CLINICAL DATA:  Fall.  EXAM: CT HEAD WITHOUT CONTRAST CT CERVICAL SPINE WITHOUT CONTRAST TECHNIQUE: Multidetector CT imaging of the head and cervical spine was performed following the standard protocol without intravenous contrast. Multiplanar CT image reconstructions of the cervical spine were also generated. COMPARISON:  CT head and cervical spine dated Mar 19, 2018. FINDINGS: CT HEAD FINDINGS Brain: No evidence of acute infarction, hemorrhage, hydrocephalus, extra-axial collection or mass lesion/mass effect. Stable moderate cerebral atrophy and severe chronic microvascular ischemic changes. Unchanged lacunar infarcts in the  bilateral thalami and left corona radiata. Unchanged right occipital lobe encephalomalacia. Vascular: Calcified atherosclerosis at the skullbase. No hyperdense vessel. Skull: Negative for fracture. Sinuses/Orbits: No acute finding. Other: None. CT CERVICAL SPINE FINDINGS Alignment: Normal. Skull base and vertebrae: No acute fracture. No primary bone lesion or focal pathologic process. Unchanged chronic T3 compression deformity. Soft tissues and spinal canal: No prevertebral fluid or swelling. No visible canal hematoma. Disc levels: Moderate multilevel degenerative disc disease and facet arthropathy throughout the cervical spine. Upper chest: Negative. Other: Unchanged heterogeneous thyroid gland. IMPRESSION: 1.  No acute intracranial abnormality. 2.  No acute cervical spine fracture. Electronically Signed   By: Titus Dubin M.D.   On: 04/04/2018 22:06    Scheduled Meds: . aspirin EC  81 mg Oral Daily  . DULoxetine  30 mg Oral Daily  . enoxaparin (LOVENOX) injection  40 mg Subcutaneous Q24H  . feeding supplement (ENSURE ENLIVE)  237 mL Oral BID BM  . loratadine  10 mg Oral Daily  . mouth rinse  15 mL Mouth Rinse BID  . metoprolol tartrate  12.5 mg Oral BID  . midodrine  2.5 mg Oral BID WC  . multivitamin  1 tablet Oral Daily  . senna  1 tablet Oral BID  . tamsulosin  0.4 mg Oral Q supper  . Vitamin D (Ergocalciferol)  50,000 Units Oral Q7 days   Continuous Infusions: . azithromycin Stopped (04/05/18 2211)  . cefTRIAXone (ROCEPHIN)  IV Stopped (04/05/18 2141)     LOS: 1 day    Time spent: 30 min   Velvet Bathe, MD [facility] Pager [phone]  If 7PM-7AM, please contact night-coverage [email or web address] Password [email or web address] 04/06/2018, 2:55 PM

## 2018-04-06 NOTE — Clinical Social Work Note (Signed)
Clinical Social Work Assessment  Patient Details  Name: Carl Rios MRN: [id number] Date of Birth: May 04, 1926  Date of referral:  04/06/18               Reason for consult:  Facility Placement                Permission sought to share information with:  Case Manager, Customer service manager, Family Supports Permission granted to share information::  No(Patient only oriented to self)  Name::     Rod and Location manager::  SNF  Relationship::  sons  Contact Information:     Housing/Transportation Living arrangements for the past 2 months:  [place] of Information:  Adult Children Patient Interpreter Needed:  None Criminal Activity/Legal Involvement Pertinent to Current Situation/Hospitalization:  No - Comment as needed Significant Relationships:  Adult Children, Warehouse manager Lives with:  Facility Resident Do you feel safe going back to the place where you live?    Need for family participation in patient care:  Yes (Comment)  Care giving concerns:  Son, Rod reports that patients level of care exceeds the facilities abilities. Patient is requiring more assistance. Family currently paying for additional support to help with ADLs and med administration. Patient cannot return to ALF.   Social Worker assessment / plan:  LCSW following for SNF placement.  PT recommending SNF for short term rehab. Family is wanting LTC SNF  placement for patient.   Patient admitted for pneumonia.  LCSW spoke with son Rod, who is also HCPOA. Accoridng to Rob patient has been at [facility] since 2015. Patient started in independent living, but transitioned to ALF this past Jan after a SNF stay. Patient went to [facility] for SNF in Dec of 2018.   Son reports that patient uses a rollator at the facility and sometimes a wheel chair. He states that patient requires assistance with all ADLs. Recently patient had difficulty feeding himself. Son reports patient was receiving  PT/OT 2x per week.   Due to patients increased need for assistance family is looking for LTC SNF placement.   PLAN: SNF at dc at this time patient does not meet 3 midnight criteria for insurance to pay for rehab.   Employment status:  Retired Forensic scientist:  Medicare PT Recommendations:  [place] / Referral to community resources:     Patient/Family's Response to care:  Patients family is proactive in patient care. Family is thankful for LCSW coordination with dc planning.   Patient/Family's Understanding of and Emotional Response to Diagnosis, Current Treatment, and Prognosis:  Family is realistic about patients current diagnosis and needs for additional assistance. Family is hopeful patient will meet 3 midnight stay for insurance to cover rehab. Family is hopeful patient will transition to SNF for LTC following rehab.   Emotional Assessment Appearance:  Appears stated age Attitude/Demeanor/Rapport:  Unable to Assess Affect (typically observed):  Calm Orientation:  Oriented to Self Alcohol / Substance use:  Not Applicable Psych involvement (Current and /or in the community):  No (Comment)  Discharge Needs  Concerns to be addressed:    Readmission within the last 30 days:  No Current discharge risk:  None Barriers to Discharge:  Continued Medical Work up, No SNF bed   Servando Snare, LCSW 04/06/2018, 1:37 PM

## 2018-04-07 NOTE — Progress Notes (Addendum)
Patient does not have a bed today.   Patient cannot return to ALF. Needs higher level of care.   LCSW followed up with bed offers and awaiting response on bed availability.   Paged attending to sign FL2.  LCSW will continue to follow for disposition.    Carl Rios [place]

## 2018-04-07 NOTE — Progress Notes (Signed)
PROGRESS NOTE    Carl Rios  WJX:[id number] DOB: July 27, 1926 DOA: 04/04/2018 PCP: Marton Redwood, MD    Brief Narrative:  84 y.o. male, w hypertension, hyperlipidemia, tachy/brady CVA, w unsteady gait, AAA, PVD, osteoporosis,  apparently presents w recurrent falls and today on CXR had ? Pneumonia.  Assessment & Plan:   Principal Problem:   CAP (community acquired pneumonia) - Wean to room air as tolerated. - Pt on antibiotics will continue - PT to increase ambulation.  Frequent falls - Could have been secondary to new onset pneumonia.   - PT evaluation. Pt will need SNF with PT while there.  Active Problems:   Hypertension - Pt on lopressor  BPH - stable on flomax  DVT prophylaxis: Lovenox Code Status: Full Family Communication: d/c son at bedside. Disposition Plan: d/c to SNF most likely next am.  Consultants:   none   Procedures: none  Antimicrobials: Azithromycin, and Rocephin  Subjective: Son's questions were answered to his satisfaction.  No new complaints reported  Objective: Vitals:   04/06/18 2052 04/06/18 2159 04/07/18 0518 04/07/18 1514  BP: (!) 159/104 (!) 155/103 (!) 156/99 (!) 152/89  Pulse: 88  85 83  Resp: 18  17 (!) 24  Temp: 97.9 F (36.6 C)  (!) 97.5 F (36.4 C) (!) 97.3 F (36.3 C)  TempSrc: Oral  Axillary Oral  SpO2: 94%  98% 98%  Weight:      Height:       No intake or output data in the 24 hours ending 04/07/18 1520 Filed Weights   04/06/18 1019  Weight: 78.9 kg (174 lb)    Examination:  General exam: In no acute distress, awake and alert Respiratory system: No wheezes, equal chest rise, nasal cannula in place Cardiovascular system: S1 & S2 heard, RRR. No JVD, murmurs, rubs, Gastrointestinal system: Abdomen is nondistended, soft and nontender. No organomegaly or masses felt. Normal bowel sounds heard. Central nervous system: pleasantly confused, hard of hearing, moves extremities equally.  No facial asymmetry Extremities:  Warm, no cyanosis Skin: No rashes, lesions or ulcers Psychiatry: Impaired judgement and insight. Mood & affect appropriate.     Data Reviewed: I have personally reviewed following labs and imaging studies  CBC: Recent Labs  Lab 04/04/18 2330  WBC 9.4  NEUTROABS 6.6  HGB 14.3  HCT 43.2  MCV 91.9  PLT 213   Basic Metabolic Panel: Recent Labs  Lab 04/04/18 2330  NA 138  K 4.1  CL 105  CO2 25  GLUCOSE 115*  BUN 18  CREATININE 0.78  CALCIUM 8.6*   GFR: Estimated Creatinine Clearance: 68.5 mL/min (by C-G formula based on SCr of 0.78 mg/dL). Liver Function Tests: No results for input(s): AST, ALT, ALKPHOS, BILITOT, PROT, ALBUMIN in the last 168 hours. No results for input(s): LIPASE, AMYLASE in the last 168 hours. No results for input(s): AMMONIA in the last 168 hours. Coagulation Profile: No results for input(s): INR, PROTIME in the last 168 hours. Cardiac Enzymes: No results for input(s): CKTOTAL, CKMB, CKMBINDEX, TROPONINI in the last 168 hours. BNP (last 3 results) No results for input(s): PROBNP in the last 8760 hours. HbA1C: No results for input(s): HGBA1C in the last 72 hours. CBG: No results for input(s): GLUCAP in the last 168 hours. Lipid Profile: No results for input(s): CHOL, HDL, LDLCALC, TRIG, CHOLHDL, LDLDIRECT in the last 72 hours. Thyroid Function Tests: No results for input(s): TSH, T4TOTAL, FREET4, T3FREE, THYROIDAB in the last 72 hours. Anemia Panel: No results for  input(s): VITAMINB12, FOLATE, FERRITIN, TIBC, IRON, RETICCTPCT in the last 72 hours. Sepsis Labs: No results for input(s): PROCALCITON, LATICACIDVEN in the last 168 hours.  Recent Results (from the past 240 hour(s))  Culture, blood (routine x 2)     Status: None (Preliminary result)   Collection Time: 04/05/18 12:46 AM  Result Value Ref Range Status   Specimen Description BLOOD RIGHT WRIST  Final   Special Requests   Final    BOTTLES DRAWN AEROBIC AND ANAEROBIC Blood Culture  results may not be optimal due to an excessive volume of blood received in culture bottles Performed at [facility] [address], [place], [place] [address]    Culture   Final    NO GROWTH 2 DAYS Performed at [facility] [address], [place], [place] [address]    Report Status PENDING  Incomplete  Culture, blood (routine x 2)     Status: None (Preliminary result)   Collection Time: 04/05/18 12:56 AM  Result Value Ref Range Status   Specimen Description   Final    BLOOD RIGHT ANTECUBITAL Performed at [facility] [address], [place], [place] [address]    Special Requests   Final    BOTTLES DRAWN AEROBIC AND ANAEROBIC Blood Culture adequate volume Performed at [facility] [address], [place], [place] [address]    Culture   Final    NO GROWTH 2 DAYS Performed at [facility] [address], [place], [place] [address]    Report Status PENDING  Incomplete         Radiology Studies: No results found.  Scheduled Meds: . aspirin EC  81 mg Oral Daily  . DULoxetine  30 mg Oral Daily  . enoxaparin (LOVENOX) injection  40 mg Subcutaneous Q24H  . feeding supplement (ENSURE ENLIVE)  237 mL Oral BID BM  . loratadine  10 mg Oral Daily  . mouth rinse  15 mL Mouth Rinse BID  . metoprolol tartrate  12.5 mg Oral BID  . midodrine  2.5 mg Oral BID WC  . multivitamin  1 tablet Oral Daily  . senna  1 tablet Oral BID  . tamsulosin  0.4 mg Oral Q supper  . Vitamin D (Ergocalciferol)  50,000 Units Oral Q7 days   Continuous Infusions: . azithromycin Stopped (04/06/18 2219)  . cefTRIAXone (ROCEPHIN)  IV Stopped (04/06/18 2149)     LOS: 2 days    Time spent: 30 min   Velvet Bathe, MD [facility] Pager [phone]  If 7PM-7AM, please contact night-coverage [email or web address] Password [email or web address] 04/07/2018, 3:20 PM

## 2018-04-08 DIAGNOSIS — K219 Gastro-esophageal reflux disease without esophagitis: Secondary | ICD-10-CM | POA: Diagnosis not present

## 2018-04-08 DIAGNOSIS — E559 Vitamin D deficiency, unspecified: Secondary | ICD-10-CM | POA: Diagnosis not present

## 2018-04-08 DIAGNOSIS — R41841 Cognitive communication deficit: Secondary | ICD-10-CM | POA: Diagnosis not present

## 2018-04-08 DIAGNOSIS — N419 Inflammatory disease of prostate, unspecified: Secondary | ICD-10-CM | POA: Diagnosis not present

## 2018-04-08 DIAGNOSIS — G629 Polyneuropathy, unspecified: Secondary | ICD-10-CM | POA: Diagnosis not present

## 2018-04-08 DIAGNOSIS — S63281D Dislocation of proximal interphalangeal joint of left index finger, subsequent encounter: Secondary | ICD-10-CM | POA: Diagnosis not present

## 2018-04-08 DIAGNOSIS — N400 Enlarged prostate without lower urinary tract symptoms: Secondary | ICD-10-CM | POA: Diagnosis not present

## 2018-04-08 DIAGNOSIS — I739 Peripheral vascular disease, unspecified: Secondary | ICD-10-CM | POA: Diagnosis not present

## 2018-04-08 DIAGNOSIS — J181 Lobar pneumonia, unspecified organism: Secondary | ICD-10-CM | POA: Diagnosis not present

## 2018-04-08 DIAGNOSIS — M6281 Muscle weakness (generalized): Secondary | ICD-10-CM | POA: Diagnosis not present

## 2018-04-08 DIAGNOSIS — I951 Orthostatic hypotension: Secondary | ICD-10-CM | POA: Diagnosis not present

## 2018-04-08 DIAGNOSIS — E785 Hyperlipidemia, unspecified: Secondary | ICD-10-CM | POA: Diagnosis not present

## 2018-04-08 DIAGNOSIS — N3281 Overactive bladder: Secondary | ICD-10-CM | POA: Diagnosis not present

## 2018-04-08 DIAGNOSIS — I714 Abdominal aortic aneurysm, without rupture: Secondary | ICD-10-CM | POA: Diagnosis not present

## 2018-04-08 DIAGNOSIS — I69398 Other sequelae of cerebral infarction: Secondary | ICD-10-CM | POA: Diagnosis not present

## 2018-04-08 DIAGNOSIS — S63289D Dislocation of proximal interphalangeal joint of unspecified finger, subsequent encounter: Secondary | ICD-10-CM | POA: Diagnosis not present

## 2018-04-08 DIAGNOSIS — H9190 Unspecified hearing loss, unspecified ear: Secondary | ICD-10-CM | POA: Diagnosis not present

## 2018-04-08 DIAGNOSIS — I10 Essential (primary) hypertension: Secondary | ICD-10-CM | POA: Diagnosis not present

## 2018-04-08 DIAGNOSIS — R1312 Dysphagia, oropharyngeal phase: Secondary | ICD-10-CM | POA: Diagnosis not present

## 2018-04-08 DIAGNOSIS — M2550 Pain in unspecified joint: Secondary | ICD-10-CM | POA: Diagnosis not present

## 2018-04-08 DIAGNOSIS — Z9181 History of falling: Secondary | ICD-10-CM | POA: Diagnosis not present

## 2018-04-08 DIAGNOSIS — S63283D Dislocation of proximal interphalangeal joint of left middle finger, subsequent encounter: Secondary | ICD-10-CM | POA: Diagnosis not present

## 2018-04-08 DIAGNOSIS — R2681 Unsteadiness on feet: Secondary | ICD-10-CM | POA: Diagnosis not present

## 2018-04-08 DIAGNOSIS — M810 Age-related osteoporosis without current pathological fracture: Secondary | ICD-10-CM | POA: Diagnosis not present

## 2018-04-08 DIAGNOSIS — R1314 Dysphagia, pharyngoesophageal phase: Secondary | ICD-10-CM | POA: Diagnosis not present

## 2018-04-08 DIAGNOSIS — K5900 Constipation, unspecified: Secondary | ICD-10-CM | POA: Diagnosis not present

## 2018-04-08 DIAGNOSIS — J309 Allergic rhinitis, unspecified: Secondary | ICD-10-CM | POA: Diagnosis not present

## 2018-04-08 DIAGNOSIS — R278 Other lack of coordination: Secondary | ICD-10-CM | POA: Diagnosis not present

## 2018-04-08 DIAGNOSIS — S2242XD Multiple fractures of ribs, left side, subsequent encounter for fracture with routine healing: Secondary | ICD-10-CM | POA: Diagnosis not present

## 2018-04-08 DIAGNOSIS — K5790 Diverticulosis of intestine, part unspecified, without perforation or abscess without bleeding: Secondary | ICD-10-CM | POA: Diagnosis not present

## 2018-04-08 DIAGNOSIS — Z7401 Bed confinement status: Secondary | ICD-10-CM | POA: Diagnosis not present

## 2018-04-08 DIAGNOSIS — H3530 Unspecified macular degeneration: Secondary | ICD-10-CM | POA: Diagnosis not present

## 2018-04-08 DIAGNOSIS — Z7982 Long term (current) use of aspirin: Secondary | ICD-10-CM | POA: Diagnosis not present

## 2018-04-08 LAB — GLUCOSE, CAPILLARY: Glucose-Capillary: 100 mg/dL — ABNORMAL HIGH (ref 65–99)

## 2018-04-08 MED ORDER — LEVOFLOXACIN 500 MG PO TABS: 500.0000 mg | ORAL_TABLET | Freq: Every day | ORAL | 0 refills | Status: AC

## 2018-04-08 NOTE — Progress Notes (Addendum)
D/C summary sent to facility Nurse call report [phone], Ask to speak to ED.  Patient son Rod notified of discharge.  [facility] arranged for transport.   Kathrin Greathouse, Latanya Presser, MSW Clinical Social Worker  [phone] 04/08/2018  12:45 PM

## 2018-04-08 NOTE — Discharge Summary (Addendum)
Physician Discharge Summary  Carl Rios:[id number] DOB: Apr 20, 1926 DOA: 04/04/2018  PCP: Marton Redwood, MD  Admit date: 04/04/2018 Discharge date: 04/08/2018  Time spent: 36 minutes  Recommendations for Outpatient Follow-up:  1. Wean patient to room air if tolerated. May require PfT's moving forward 2. Patient required oxygen in house.  Will discharge with oxygen which can be weaned to room air with patient's improvement in condition.  Discharge Diagnoses:  Principal Problem:   CAP (community acquired pneumonia) Active Problems:   Hypertension   Discharge Condition: stable  Diet recommendation: heart healthy  Filed Weights   04/06/18 1019  Weight: 78.9 kg (174 lb)    History of present illness:  84 y.o. male, w hypertension, hyperlipidemia, tachy/brady CVA, w unsteady gait, AAA, PVD, osteoporosis,  apparently presents w recurrent falls and today on CXR had ? Pneumonia  Hospital Course:   Principal Problem:   CAP (community acquired pneumonia) - Pt will be discharged on levaquin to complete a 7 day total treatment course on antibiotics. Addendum: We will discharge with supplemental oxygen this can be weaned to room air with patient improvement in condition.  Frequent falls - Pt to transition to SNF for further evaluation and recommendations.   Active Problems:   Hypertension - continue prior to admission medication regimen  BPH - stable on flomax  Procedures:  None  Consultations:  none  Discharge Exam: Vitals:   04/07/18 1514 04/07/18 2318  BP: (!) 152/89 (!) 149/80  Pulse: 83 95  Resp: (!) 24 16  Temp: (!) 97.3 F (36.3 C) (!) 97.5 F (36.4 C)  SpO2: 98% 95%    General: Pt in nad, alert and awake Cardiovascular: rrr, no rubs Respiratory: no increased wob, no wheezes  Discharge Instructions   Discharge Instructions    Call MD for:  difficulty breathing, headache or visual disturbances   Complete by:  As directed    Call MD for:   temperature >100.4   Complete by:  As directed    Diet - low sodium heart healthy   Complete by:  As directed    Discharge instructions   Complete by:  As directed    Please ensure that pcp at facility continues to monitor patient and decide whether or not to prolong patient's antibiotic course.   Face-to-face encounter (required for Medicare/Medicaid patients)   Complete by:  As directed    I Carl Rios certify that this patient is under my care and that I, or a nurse practitioner or physician's assistant working with me, had a face-to-face encounter that meets the physician face-to-face encounter requirements with this patient on 04/04/2018. The encounter with the patient was in whole, or in part for the following medical condition(s) which is the primary reason for home health care (List medical condition): Frequent falls   The encounter with the patient was in whole, or in part, for the following medical condition, which is the primary reason for home health care:  fall   I certify that, based on my findings, the following services are medically necessary home health services:   Nursing Physical therapy     Reason for Medically Necessary Home Health Services:  Skilled Nursing- Change/Decline in Patient Status   My clinical findings support the need for the above services:  Pain interferes with ambulation/mobility   Further, I certify that my clinical findings support that this patient is homebound due to:  Unsafe ambulation due to balance issues   Home Health   Complete  by:  As directed    To provide the following care/treatments:   PT OT [place] work     Increase activity slowly   Complete by:  As directed      Allergies as of 04/08/2018      Reactions   Sulfamethoxazole Other (See Comments)   UNKNOWN      Medication List    TAKE these medications   acetaminophen 325 MG tablet Commonly known as:  TYLENOL Take 650 mg by mouth every 6 (six) hours as  needed.   aspirin 81 MG EC tablet Take 1 tablet (81 mg total) by mouth daily.   cephALEXin 500 MG capsule Commonly known as:  KEFLEX Take 1 capsule (500 mg total) by mouth 3 (three) times daily.   DULoxetine 30 MG capsule Commonly known as:  CYMBALTA Take 1 capsule (30 mg total) by mouth daily.   levofloxacin 500 MG tablet Commonly known as:  LEVAQUIN Take 1 tablet (500 mg total) by mouth daily for 3 days.   loratadine 10 MG tablet Commonly known as:  CLARITIN Take 10 mg by mouth daily.   MACULAR VITAMIN BENEFIT Tabs Take 1 tablet by mouth daily.   metoprolol tartrate 25 MG tablet Commonly known as:  LOPRESSOR Take 12.5 mg by mouth 2 (two) times daily.   midodrine 2.5 MG tablet Commonly known as:  PROAMATINE Take 1 tablet (2.5 mg total) 2 (two) times daily with a meal by mouth.   polyethylene glycol packet Commonly known as:  MIRALAX / GLYCOLAX Take 17 g daily as needed by mouth for mild constipation.   senna 8.6 MG Tabs tablet Commonly known as:  SENOKOT Take 1 tablet (8.6 mg total) 2 (two) times daily by mouth.   tamsulosin 0.4 MG Caps capsule Commonly known as:  FLOMAX Take 0.4 mg daily with supper by mouth.   Vitamin D (Ergocalciferol) 50000 units Caps capsule Commonly known as:  DRISDOL Take 50,000 Units by mouth every 7 (seven) days.      Allergies  Allergen Reactions  . Sulfamethoxazole Other (See Comments)    UNKNOWN    Contact information for follow-up providers    Marton Redwood, MD. Schedule an appointment as soon as possible for a visit in 3 days.   Specialty:  Internal Medicine Contact information: [place] [address] [phone]        [place] DEPT.   Specialty:  Emergency Medicine Why:  If symptoms worsen Contact information: [place] [id number] [place] [phone]           Contact information for after-discharge care     Destination    [facility] .   Service:  Skilled Nursing Contact information: [address] [place] [phone]                   The results of significant diagnostics from this hospitalization (including imaging, microbiology, ancillary and laboratory) are listed below for reference.    Significant Diagnostic Studies: Dg Chest 1 View  Result Date: 04/04/2018 CLINICAL DATA:  Fall getting out of bed. EXAM: CHEST  1 VIEW COMPARISON:  Radiographs 03/19/2018, additional priors FINDINGS: Low lung volumes. Unchanged heart size and mediastinal contours with tortuous thoracic aorta. Ill-defined left basilar opacity may be atelectasis or small pleural effusion. Minimal right infrahilar atelectasis. No pneumothorax. Multiple left rib fractures, as seen on prior exams. IMPRESSION: 1. Low lung volumes.  Ill-defined left basilar opacity may be combination of atelectasis and pleural fluid. 2. Tortuous atherosclerotic aorta, unchanged. 3. Left rib fractures, subacute or chronic, as seen on prior exams. Electronically Signed   By: Jeb Levering M.D.   On: 04/04/2018 21:32   Dg Chest 2 View  Result Date: 03/19/2018 CLINICAL DATA:  Status post unwitnessed fall, with concern for chest injury. Initial encounter. EXAM: CHEST - 2 VIEW COMPARISON:  Chest radiograph performed 03/14/2018 FINDINGS: The lungs are well-aerated. Vascular congestion is noted. Mildly increased interstitial markings on the left could reflect mild transient interstitial edema. There is no evidence of pleural effusion or pneumothorax. The heart is normal in size; the mediastinal contour is within normal limits. Left-sided rib fractures are again noted. Scattered soft tissue air is noted along the left chest wall. IMPRESSION: 1. Vascular congestion noted. Mildly increased interstitial markings on the left could reflect mild transient interstitial edema. 2. Left-sided rib fractures again noted,  as on the study from May 14. Electronically Signed   By: Garald Balding M.D.   On: 03/19/2018 05:27   Dg Sternum  Result Date: 03/19/2018 CLINICAL DATA:  Status post unwitnessed fall, with concern for sternal injury. Initial encounter. EXAM: STERNUM - 2+ VIEW COMPARISON:  CT of the chest performed 09/09/2014 FINDINGS: There is no evidence of fracture or dislocation. The sternum appears grossly intact. The visualized portions of the lungs are grossly clear. IMPRESSION: No evidence of fracture or dislocation. Electronically Signed   By: Garald Balding M.D.   On: 03/19/2018 05:28   Dg Thoracic Spine 2 View  Result Date: 03/19/2018 CLINICAL DATA:  Status post unwitnessed fall, with upper back pain. Initial encounter. EXAM: THORACIC SPINE 2 VIEWS COMPARISON:  CT of the chest performed 09/09/2017 FINDINGS: There is no evidence of acute fracture or subluxation. Vertebral bodies demonstrate normal alignment. Intervertebral disc spaces are preserved. Chronic compression deformities are again noted at the mid and lower thoracic spine. The visualized portions of both lungs are clear. The mediastinum is unremarkable in appearance. IMPRESSION: No evidence of acute fracture or subluxation along the thoracic spine. Chronic compression deformities again noted at the mid and lower thoracic spine. Electronically Signed   By: Garald Balding M.D.   On: 03/19/2018 05:29   Dg Thoracic Spine W/swimmers  Result Date: 04/04/2018 CLINICAL DATA:  Thoracolumbar back pain after fall attempting to get out of bed. EXAM: THORACIC SPINE - 3 VIEWS COMPARISON:  Radiographs 2 weeks prior 03/19/2018 FINDINGS: Lateral view slightly limited by cross-table technique. Chronic loss of height of mid and lower thoracic vertebral bodies, tentatively T11 and T6, unchanged from prior exam. No evidence of acute fracture. No paravertebral soft tissue abnormality to suggest fracture. IMPRESSION: Mild chronic compression fractures in the thoracic spine  without evidence of acute abnormality. Electronically Signed   By: Jeb Levering M.D.   On: 04/04/2018 21:26   Dg Lumbar Spine Complete  Result Date: 04/04/2018 CLINICAL DATA:  Thoracolumbar back pain after fall out of bed. EXAM: LUMBAR SPINE - COMPLETE 4+ VIEW COMPARISON:  Radiographs 2 weeks prior 03/19/2018 FINDINGS: Chronic compression fractures of T11, T12, L2 and L3. No progression from prior. No acute fracture. Facet arthropathy of the lower lumbar spine. Similar degenerative disc disease. Bones are under mineralized. Aorto bi-iliac stent graft in place. IMPRESSION: Chronic compression fractures in the lumbar spine without progression or acute abnormality. Electronically Signed   By: Jeb Levering M.D.   On: 04/04/2018 21:28   Dg Lumbar Spine Complete  Result  Date: 03/19/2018 CLINICAL DATA:  Status post unwitnessed fall, with acute on chronic lower back pain. Initial encounter. EXAM: LUMBAR SPINE - COMPLETE 4+ VIEW COMPARISON:  Lumbar spine radiographs performed 07/26/2015 FINDINGS: There is no evidence of fracture or subluxation. There is mild chronic loss of height at vertebral bodies T11, T12, L2 and L3. Vertebral bodies demonstrate normal alignment. Intervertebral disc spaces are preserved. Mild facet disease is noted at the lower lumbar spine. Lateral osteophytes are noted along the lower thoracic and upper lumbar spine. The visualized bowel gas pattern is unremarkable in appearance; air and stool are noted within the colon. The sacroiliac joints are within normal limits. An aortoiliac stent graft is noted. IMPRESSION: 1. No evidence of acute fracture or subluxation along the lumbar spine. 2. Mild chronic loss of height at vertebral bodies T11, T12, L2 and L3. Electronically Signed   By: Garald Balding M.D.   On: 03/19/2018 05:31   Dg Pelvis 1-2 Views  Result Date: 03/19/2018 CLINICAL DATA:  Status post unwitnessed fall, with concern for pelvic injury. Initial encounter. EXAM: PELVIS -  1-2 VIEW COMPARISON:  Pelvic radiograph performed 03/26/2014 FINDINGS: There is no evidence of fracture or dislocation. Both femoral heads are seated normally within their respective acetabula. No significant degenerative change is appreciated. Mild sclerosis is noted at the sacroiliac joints. The visualized bowel gas pattern is grossly unremarkable in appearance. An aortoiliac stent graft is noted. IMPRESSION: No evidence of fracture or dislocation. Electronically Signed   By: Garald Balding M.D.   On: 03/19/2018 05:31   Ct Head Wo Contrast  Result Date: 04/04/2018 CLINICAL DATA:  Fall. EXAM: CT HEAD WITHOUT CONTRAST CT CERVICAL SPINE WITHOUT CONTRAST TECHNIQUE: Multidetector CT imaging of the head and cervical spine was performed following the standard protocol without intravenous contrast. Multiplanar CT image reconstructions of the cervical spine were also generated. COMPARISON:  CT head and cervical spine dated Mar 19, 2018. FINDINGS: CT HEAD FINDINGS Brain: No evidence of acute infarction, hemorrhage, hydrocephalus, extra-axial collection or mass lesion/mass effect. Stable moderate cerebral atrophy and severe chronic microvascular ischemic changes. Unchanged lacunar infarcts in the bilateral thalami and left corona radiata. Unchanged right occipital lobe encephalomalacia. Vascular: Calcified atherosclerosis at the skullbase. No hyperdense vessel. Skull: Negative for fracture. Sinuses/Orbits: No acute finding. Other: None. CT CERVICAL SPINE FINDINGS Alignment: Normal. Skull base and vertebrae: No acute fracture. No primary bone lesion or focal pathologic process. Unchanged chronic T3 compression deformity. Soft tissues and spinal canal: No prevertebral fluid or swelling. No visible canal hematoma. Disc levels: Moderate multilevel degenerative disc disease and facet arthropathy throughout the cervical spine. Upper chest: Negative. Other: Unchanged heterogeneous thyroid gland. IMPRESSION: 1.  No acute  intracranial abnormality. 2.  No acute cervical spine fracture. Electronically Signed   By: Titus Dubin M.D.   On: 04/04/2018 22:06   Ct Head Wo Contrast  Result Date: 03/19/2018 CLINICAL DATA:  Status post unwitnessed fall, with concern for head or cervical spine injury. Initial encounter. EXAM: CT HEAD WITHOUT CONTRAST CT CERVICAL SPINE WITHOUT CONTRAST TECHNIQUE: Multidetector CT imaging of the head and cervical spine was performed following the standard protocol without intravenous contrast. Multiplanar CT image reconstructions of the cervical spine were also generated. COMPARISON:  CT of the head and cervical spine performed 03/14/2018, and MRI of the brain performed 10/17/2017 FINDINGS: CT HEAD FINDINGS Brain: No evidence of acute infarction, hemorrhage, hydrocephalus, extra-axial collection or mass lesion / mass effect. Prominence of the ventricles and sulci reflects moderately severe cortical volume  loss. Cerebellar atrophy is noted. Diffuse periventricular and subcortical white matter change likely reflects small vessel ischemic microangiopathy. Chronic lacunar infarcts are noted at the corona radiata bilaterally. The brainstem and fourth ventricle are within normal limits. The basal ganglia are unremarkable in appearance. The cerebral hemispheres demonstrate grossly normal gray-white differentiation. No mass effect or midline shift is seen. Vascular: No hyperdense vessel or unexpected calcification. Skull: There is no evidence of fracture; visualized osseous structures are unremarkable in appearance. Sinuses/Orbits: The orbits are within normal limits. The paranasal sinuses and mastoid air cells are well-aerated. Other: No significant soft tissue abnormalities are seen. CT CERVICAL SPINE FINDINGS Alignment: Normal. Skull base and vertebrae: No acute fracture. No primary bone lesion or focal pathologic process. Soft tissues and spinal canal: No prevertebral fluid or swelling. No visible canal  hematoma. Disc levels: Multilevel disc space narrowing is noted along the cervical spine, with underlying facet disease. Scattered anterior and posterior disc osteophyte complexes are noted along the cervical spine. Upper chest: Mild scarring and atelectasis are noted at the lung apices, with a trace left pleural effusion. Scattered calcifications are noted at the thyroid gland, with mild nonspecific heterogeneity noted bilaterally. There is mild calcification at the carotid bifurcations bilaterally. Other: No additional soft tissue abnormalities are seen. IMPRESSION: 1. No evidence of traumatic intracranial injury or fracture. 2. No evidence of fracture or subluxation along the cervical spine. 3. Moderately severe cortical volume loss and diffuse small vessel ischemic microangiopathy. 4. Chronic lacunar infarcts at the corona radiata bilaterally. 5. Mild degenerative change along the cervical spine. 6. Mild scarring and atelectasis at the lung apices, with a trace left pleural effusion. 7. Mild calcification at the carotid bifurcations bilaterally. Electronically Signed   By: Garald Balding M.D.   On: 03/19/2018 05:23   Ct Head Wo Contrast  Result Date: 03/14/2018 CLINICAL DATA:  84 year old male with unwitnessed fall. Initial encounter. EXAM: CT HEAD WITHOUT CONTRAST CT CERVICAL SPINE WITHOUT CONTRAST TECHNIQUE: Multidetector CT imaging of the head and cervical spine was performed following the standard protocol without intravenous contrast. Multiplanar CT image reconstructions of the cervical spine were also generated. COMPARISON:  07/26/2015 head CT and cervical spine CT. 10/17/2017 brain MR. 09/09/2014 chest CT. FINDINGS: CT HEAD FINDINGS Brain: No intracranial hemorrhage or CT evidence of large acute infarct. Remote right occipital lobe infarct. Remote left Corona radiata/lenticular nucleus infarct. Remote right thalamic infarct. Marked chronic microvascular changes. Moderate to marked global atrophy. No  intracranial mass lesion noted on this unenhanced exam. Vascular: Vascular calcification Skull: No skull fracture Sinuses/Orbits: No acute orbital abnormality. Visualized paranasal sinuses are clear. Other: Mastoid air cells and middle ear cavities are clear. CT CERVICAL SPINE FINDINGS Alignment: Within normal limits. Skull base and vertebrae: No acute cervical spine fracture. Remote T3 superior endplate compression fracture. Soft tissues and spinal canal: No abnormal prevertebral soft tissue swelling. Disc levels: Cervical spondylotic changes with spinal stenosis most notable C6-7 followed by C5-6. Upper chest: Biapical pleural scarring. Left-sided pleural effusion. Other: Goiter.  Carotid calcifications. IMPRESSION: No skull fracture or intracranial hemorrhage. No cervical spine fracture, malalignment or abnormal prevertebral soft tissue swelling. Left-sided pleural effusion. Chronic changes otherwise as detailed above. Electronically Signed   By: Genia Del M.D.   On: 03/14/2018 15:54   Ct Chest Wo Contrast  Result Date: 04/04/2018 CLINICAL DATA:  Assess for pneumonia. Shortness of breath. EXAM: CT CHEST WITHOUT CONTRAST TECHNIQUE: Multidetector CT imaging of the chest was performed following the standard protocol without IV contrast. COMPARISON:  Chest radiograph performed earlier today at 9:07 p.m., and CT of the chest performed 09/09/2014 FINDINGS: Cardiovascular: The heart is normal in size. Diffuse coronary artery calcifications are seen. Scattered calcification is noted along the thoracic aorta. The great vessels are grossly unremarkable, though not well assessed without contrast. Mediastinum/Nodes: The mediastinum is grossly unremarkable. No mediastinal lymphadenopathy is seen. No pericardial effusion is identified. A 3.5 cm mildly heterogeneous left thyroid lobe mass is noted. No axillary lymphadenopathy is seen. Lungs/Pleura: The mild left basilar airspace opacity may reflect atelectasis or  pneumonia. No pleural effusion or pneumothorax is seen. No dominant mass is seen. Mild bronchomalacia is noted. Upper Abdomen: The visualized portions of the liver and spleen are unremarkable. The visualized portions of the adrenal glands and kidneys are within normal limits. There is aneurysmal dilatation of the proximal abdominal aorta to 3.4 cm in AP dimension. Musculoskeletal: There are non-healed displaced subacute fractures of the left sixth through eleventh ribs. There is mild chronic loss of height at vertebral bodies T3, T6, T11 and T12. The visualized musculature is unremarkable in appearance. IMPRESSION: 1. Mild left basilar airspace opacity may reflect atelectasis or pneumonia. 2. Non-healed subacute fractures of the left sixth through eleventh ribs. 3. Diffuse coronary artery calcifications seen. 4. 3.5 cm mildly heterogeneous left thyroid lobe mass noted. Recommend further evaluation with thyroid ultrasound. If patient is clinically hyperthyroid, consider nuclear medicine thyroid uptake and scan. 5. Aneurysmal dilatation of the proximal abdominal aorta to 3.4 cm in AP dimension. Recommend followup by [place] in 3 years. This recommendation follows ACR consensus guidelines: White Paper of the ACR Incidental Findings Committee II on Vascular Findings. J Am Coll Radiol 2013; 10:789-794 Aortic Atherosclerosis (ICD10-I70.0). Electronically Signed   By: Garald Balding M.D.   On: 04/04/2018 23:26   Ct Cervical Spine Wo Contrast  Result Date: 04/04/2018 CLINICAL DATA:  Fall. EXAM: CT HEAD WITHOUT CONTRAST CT CERVICAL SPINE WITHOUT CONTRAST TECHNIQUE: Multidetector CT imaging of the head and cervical spine was performed following the standard protocol without intravenous contrast. Multiplanar CT image reconstructions of the cervical spine were also generated. COMPARISON:  CT head and cervical spine dated Mar 19, 2018. FINDINGS: CT HEAD FINDINGS Brain: No evidence of acute infarction, hemorrhage, hydrocephalus,  extra-axial collection or mass lesion/mass effect. Stable moderate cerebral atrophy and severe chronic microvascular ischemic changes. Unchanged lacunar infarcts in the bilateral thalami and left corona radiata. Unchanged right occipital lobe encephalomalacia. Vascular: Calcified atherosclerosis at the skullbase. No hyperdense vessel. Skull: Negative for fracture. Sinuses/Orbits: No acute finding. Other: None. CT CERVICAL SPINE FINDINGS Alignment: Normal. Skull base and vertebrae: No acute fracture. No primary bone lesion or focal pathologic process. Unchanged chronic T3 compression deformity. Soft tissues and spinal canal: No prevertebral fluid or swelling. No visible canal hematoma. Disc levels: Moderate multilevel degenerative disc disease and facet arthropathy throughout the cervical spine. Upper chest: Negative. Other: Unchanged heterogeneous thyroid gland. IMPRESSION: 1.  No acute intracranial abnormality. 2.  No acute cervical spine fracture. Electronically Signed   By: Titus Dubin M.D.   On: 04/04/2018 22:06   Ct Cervical Spine Wo Contrast  Result Date: 03/19/2018 CLINICAL DATA:  Status post unwitnessed fall, with concern for head or cervical spine injury. Initial encounter. EXAM: CT HEAD WITHOUT CONTRAST CT CERVICAL SPINE WITHOUT CONTRAST TECHNIQUE: Multidetector CT imaging of the head and cervical spine was performed following the standard protocol without intravenous contrast. Multiplanar CT image reconstructions of the cervical spine were also generated. COMPARISON:  CT of the head and  cervical spine performed 03/14/2018, and MRI of the brain performed 10/17/2017 FINDINGS: CT HEAD FINDINGS Brain: No evidence of acute infarction, hemorrhage, hydrocephalus, extra-axial collection or mass lesion / mass effect. Prominence of the ventricles and sulci reflects moderately severe cortical volume loss. Cerebellar atrophy is noted. Diffuse periventricular and subcortical white matter change likely reflects  small vessel ischemic microangiopathy. Chronic lacunar infarcts are noted at the corona radiata bilaterally. The brainstem and fourth ventricle are within normal limits. The basal ganglia are unremarkable in appearance. The cerebral hemispheres demonstrate grossly normal gray-white differentiation. No mass effect or midline shift is seen. Vascular: No hyperdense vessel or unexpected calcification. Skull: There is no evidence of fracture; visualized osseous structures are unremarkable in appearance. Sinuses/Orbits: The orbits are within normal limits. The paranasal sinuses and mastoid air cells are well-aerated. Other: No significant soft tissue abnormalities are seen. CT CERVICAL SPINE FINDINGS Alignment: Normal. Skull base and vertebrae: No acute fracture. No primary bone lesion or focal pathologic process. Soft tissues and spinal canal: No prevertebral fluid or swelling. No visible canal hematoma. Disc levels: Multilevel disc space narrowing is noted along the cervical spine, with underlying facet disease. Scattered anterior and posterior disc osteophyte complexes are noted along the cervical spine. Upper chest: Mild scarring and atelectasis are noted at the lung apices, with a trace left pleural effusion. Scattered calcifications are noted at the thyroid gland, with mild nonspecific heterogeneity noted bilaterally. There is mild calcification at the carotid bifurcations bilaterally. Other: No additional soft tissue abnormalities are seen. IMPRESSION: 1. No evidence of traumatic intracranial injury or fracture. 2. No evidence of fracture or subluxation along the cervical spine. 3. Moderately severe cortical volume loss and diffuse small vessel ischemic microangiopathy. 4. Chronic lacunar infarcts at the corona radiata bilaterally. 5. Mild degenerative change along the cervical spine. 6. Mild scarring and atelectasis at the lung apices, with a trace left pleural effusion. 7. Mild calcification at the carotid  bifurcations bilaterally. Electronically Signed   By: Garald Balding M.D.   On: 03/19/2018 05:23   Ct Cervical Spine Wo Contrast  Result Date: 03/14/2018 CLINICAL DATA:  84 year old male with unwitnessed fall. Initial encounter. EXAM: CT HEAD WITHOUT CONTRAST CT CERVICAL SPINE WITHOUT CONTRAST TECHNIQUE: Multidetector CT imaging of the head and cervical spine was performed following the standard protocol without intravenous contrast. Multiplanar CT image reconstructions of the cervical spine were also generated. COMPARISON:  07/26/2015 head CT and cervical spine CT. 10/17/2017 brain MR. 09/09/2014 chest CT. FINDINGS: CT HEAD FINDINGS Brain: No intracranial hemorrhage or CT evidence of large acute infarct. Remote right occipital lobe infarct. Remote left Corona radiata/lenticular nucleus infarct. Remote right thalamic infarct. Marked chronic microvascular changes. Moderate to marked global atrophy. No intracranial mass lesion noted on this unenhanced exam. Vascular: Vascular calcification Skull: No skull fracture Sinuses/Orbits: No acute orbital abnormality. Visualized paranasal sinuses are clear. Other: Mastoid air cells and middle ear cavities are clear. CT CERVICAL SPINE FINDINGS Alignment: Within normal limits. Skull base and vertebrae: No acute cervical spine fracture. Remote T3 superior endplate compression fracture. Soft tissues and spinal canal: No abnormal prevertebral soft tissue swelling. Disc levels: Cervical spondylotic changes with spinal stenosis most notable C6-7 followed by C5-6. Upper chest: Biapical pleural scarring. Left-sided pleural effusion. Other: Goiter.  Carotid calcifications. IMPRESSION: No skull fracture or intracranial hemorrhage. No cervical spine fracture, malalignment or abnormal prevertebral soft tissue swelling. Left-sided pleural effusion. Chronic changes otherwise as detailed above. Electronically Signed   By: Genia Del M.D.   On:  03/14/2018 15:54   Dg Chest Port 1  View  Result Date: 03/14/2018 CLINICAL DATA:  Fall today EXAM: PORTABLE CHEST 1 VIEW COMPARISON:  Chest x-ray dated 08/23/2013. FINDINGS: Stable cardiomegaly. Overall cardiomediastinal silhouette appears stable in size and configuration. Vague opacity at the LEFT lung base, probable small pleural effusion and/or atelectasis. RIGHT lung is clear. No pneumothorax seen. Multiple displaced LEFT-sided rib fractures, most likely the LEFT fifth through ninth ribs. IMPRESSION: 1. Multiple LEFT-sided rib fractures, most likely the LEFT fifth through ninth ribs. 2. Opacity at the LEFT lung base, presumably associated pleural effusion/hemothorax and atelectasis. No pneumothorax seen. Electronically Signed   By: Franki Cabot M.D.   On: 03/14/2018 18:16   Dg Hand Complete Left  Result Date: 03/14/2018 CLINICAL DATA:  Post reduction EXAM: LEFT HAND - COMPLETE 3+ VIEW COMPARISON:  03/14/2018 FINDINGS: Post reduction of second and third PIP joint dislocations. Suboptimal patient positioning for the studies however there appears to be adequate reduction without fracture. Small fractures could be missed on the study due to positioning difficulty Moderate degenerative change base of thumb IMPRESSION: Satisfactory reduction of second and third PIP joints. No fracture identified on this study with suboptimal positioning. Electronically Signed   By: Franchot Gallo M.D.   On: 03/14/2018 17:34   Dg Hand Complete Left  Result Date: 03/14/2018 CLINICAL DATA:  Unwitnessed fall at nursing home. EXAM: LEFT HAND - COMPLETE 3+ VIEW COMPARISON:  None. FINDINGS: Third proximal interphalangeal joint dislocation with dorsal displacement of the middle phalanx, 8 mm overriding. Second PIP dislocation with 3 mm overriding. No fracture deformity. No destructive bony lesions. Osteopenia. Severe first carpometacarpal joint space narrowing with periarticular sclerosis and marginal spurring. No destructive bony lesions. Soft tissue planes are  nonsuspicious. IMPRESSION: Second and third PIP dislocation.  No fracture deformity. Severe first CMC osteoarthrosis. Electronically Signed   By: Elon Alas M.D.   On: 03/14/2018 14:53    Microbiology: Recent Results (from the past 240 hour(s))  Culture, blood (routine x 2)     Status: None (Preliminary result)   Collection Time: 04/05/18 12:46 AM  Result Value Ref Range Status   Specimen Description BLOOD RIGHT WRIST  Final   Special Requests   Final    BOTTLES DRAWN AEROBIC AND ANAEROBIC Blood Culture results may not be optimal due to an excessive volume of blood received in culture bottles Performed at [facility], [place] [address] [place], [place] [address]    Culture   Final    NO GROWTH 2 DAYS Performed at [facility] [address] [place], [place] [address]    Report Status PENDING  Incomplete  Culture, blood (routine x 2)     Status: None (Preliminary result)   Collection Time: 04/05/18 12:56 AM  Result Value Ref Range Status   Specimen Description   Final    BLOOD RIGHT ANTECUBITAL Performed at [facility] [address] [place], [place] [address]    Special Requests   Final    BOTTLES DRAWN AEROBIC AND ANAEROBIC Blood Culture adequate volume Performed at [facility] [address] [place], [place] [address]    Culture   Final    NO GROWTH 2 DAYS Performed at [facility] [address] [place],  [address]    Report Status PENDING  Incomplete     Labs: Basic Metabolic Panel: Recent Labs  Lab 04/04/18 2330  NA 138  K 4.1  CL 105  CO2 25  GLUCOSE 115*  BUN 18  CREATININE 0.78  CALCIUM 8.6*   Liver Function Tests: No results for input(s): AST, ALT, ALKPHOS, BILITOT, PROT, ALBUMIN in the last 168 hours. No results for input(s): LIPASE, AMYLASE in the last 168 hours. No results for input(s): AMMONIA in the last 168 hours. CBC: Recent Labs  Lab 04/04/18 2330  WBC  9.4  NEUTROABS 6.6  HGB 14.3  HCT 43.2  MCV 91.9  PLT 385   Cardiac Enzymes: No results for input(s): CKTOTAL, CKMB, CKMBINDEX, TROPONINI in the last 168 hours. BNP: BNP (last 3 results) Recent Labs    03/19/18 0423  BNP 64.5    ProBNP (last 3 results) No results for input(s): PROBNP in the last 8760 hours.  CBG: Recent Labs  Lab 04/08/18 0033  GLUCAP 100*    Signed:  Velvet Bathe MD.  [facility] 04/08/2018, 12:08 PM

## 2018-04-08 NOTE — Care Management Note (Signed)
Case Management Note  Patient Details  Name: [name] MRN: [id number] Date of Birth: 1925-11-20  Subjective/Objective:      Oxygen ordered              Action/Plan: Pt scheduled dc to SNF, NCM made CSW oxygen needed.   Expected Discharge Date:  04/08/18               Expected Discharge Plan:  Skilled Nursing Facility  In-House Referral:  Clinical Social Work  Discharge planning Services  CM Consult  Post Acute Care Choice:  NA Choice offered to:  NA  DME Arranged:  N/A DME Agency:  NA  HH Arranged:  NA HH Agency:  NA  Status of Service:  Completed, signed off  If discussed at [employer] of Stay Meetings, dates discussed:    Additional Comments:  Erenest Rasher, RN 04/08/2018, 1:00 PM

## 2018-04-08 NOTE — Progress Notes (Signed)
Carl Rios to be D/C'd Skilled nursing facility per MD order.  Discussed prescriptions and follow up appointments with the patient. Prescriptions given to patient, medication list explained in detail. Pt verbalized understanding.  Allergies as of 04/08/2018      Reactions   Sulfamethoxazole Other (See Comments)   UNKNOWN      Medication List    TAKE these medications   acetaminophen 325 MG tablet Commonly known as:  TYLENOL Take 650 mg by mouth every 6 (six) hours as needed.   aspirin 81 MG EC tablet Take 1 tablet (81 mg total) by mouth daily.   cephALEXin 500 MG capsule Commonly known as:  KEFLEX Take 1 capsule (500 mg total) by mouth 3 (three) times daily.   DULoxetine 30 MG capsule Commonly known as:  CYMBALTA Take 1 capsule (30 mg total) by mouth daily.   levofloxacin 500 MG tablet Commonly known as:  LEVAQUIN Take 1 tablet (500 mg total) by mouth daily for 3 days.   loratadine 10 MG tablet Commonly known as:  CLARITIN Take 10 mg by mouth daily.   MACULAR VITAMIN BENEFIT Tabs Take 1 tablet by mouth daily.   metoprolol tartrate 25 MG tablet Commonly known as:  LOPRESSOR Take 12.5 mg by mouth 2 (two) times daily.   midodrine 2.5 MG tablet Commonly known as:  PROAMATINE Take 1 tablet (2.5 mg total) 2 (two) times daily with a meal by mouth.   polyethylene glycol packet Commonly known as:  MIRALAX / GLYCOLAX Take 17 g daily as needed by mouth for mild constipation.   senna 8.6 MG Tabs tablet Commonly known as:  SENOKOT Take 1 tablet (8.6 mg total) 2 (two) times daily by mouth.   tamsulosin 0.4 MG Caps capsule Commonly known as:  FLOMAX Take 0.4 mg daily with supper by mouth.   Vitamin D (Ergocalciferol) 50000 units Caps capsule Commonly known as:  DRISDOL Take 50,000 Units by mouth every 7 (seven) days.       Vitals:   04/07/18 2318 04/08/18 1403  BP: (!) 149/80 (!) 156/103  Pulse: 95 95  Resp: 16   Temp: (!) 97.5 F (36.4 C)   SpO2: 95% 98%     Skin clean, dry and intact without evidence of skin break down, no evidence of skin tears noted. IV catheter discontinued intact. Site without signs and symptoms of complications. Dressing and pressure applied. Pt denies pain at this time. No complaints noted.  An After Visit Summary was printed and given to the patient. Patient escorted via [facility] and D/C to SNF via [facility].Marland Kitchen  Haywood Lasso BSN, RN [employer] Phone [phone]

## 2018-04-10 LAB — CULTURE, BLOOD (ROUTINE X 2)
CULTURE: NO GROWTH
Culture: NO GROWTH
Special Requests: ADEQUATE

## 2018-04-26 DIAGNOSIS — S63281D Dislocation of proximal interphalangeal joint of left index finger, subsequent encounter: Secondary | ICD-10-CM | POA: Diagnosis not present

## 2018-04-26 DIAGNOSIS — S63283D Dislocation of proximal interphalangeal joint of left middle finger, subsequent encounter: Secondary | ICD-10-CM | POA: Diagnosis not present

## 2018-05-26 DIAGNOSIS — S63289D Dislocation of proximal interphalangeal joint of unspecified finger, subsequent encounter: Secondary | ICD-10-CM | POA: Diagnosis not present

## 2018-05-26 DIAGNOSIS — G629 Polyneuropathy, unspecified: Secondary | ICD-10-CM | POA: Diagnosis not present

## 2018-05-26 DIAGNOSIS — J181 Lobar pneumonia, unspecified organism: Secondary | ICD-10-CM | POA: Diagnosis not present

## 2018-05-26 DIAGNOSIS — S2242XD Multiple fractures of ribs, left side, subsequent encounter for fracture with routine healing: Secondary | ICD-10-CM | POA: Diagnosis not present

## 2018-06-06 ENCOUNTER — Emergency Department (HOSPITAL_COMMUNITY)
Admission: EM | Admit: 2018-06-06 | Discharge: 2018-06-06 | Disposition: A | Discharge status: Home / Self Care | Payer: Medicare Other | Attending: Emergency Medicine | Admitting: Emergency Medicine

## 2018-06-06 ENCOUNTER — Other Ambulatory Visit: Payer: Self-pay

## 2018-06-06 DIAGNOSIS — T148XXA Other injury of unspecified body region, initial encounter: Secondary | ICD-10-CM

## 2018-06-06 DIAGNOSIS — W06XXXA Fall from bed, initial encounter: Secondary | ICD-10-CM | POA: Insufficient documentation

## 2018-06-06 DIAGNOSIS — S61211A Laceration without foreign body of left index finger without damage to nail, initial encounter: Secondary | ICD-10-CM | POA: Diagnosis not present

## 2018-06-06 DIAGNOSIS — Y9389 Activity, other specified: Secondary | ICD-10-CM | POA: Diagnosis not present

## 2018-06-06 DIAGNOSIS — Z7982 Long term (current) use of aspirin: Secondary | ICD-10-CM | POA: Diagnosis not present

## 2018-06-06 DIAGNOSIS — R58 Hemorrhage, not elsewhere classified: Secondary | ICD-10-CM | POA: Diagnosis not present

## 2018-06-06 DIAGNOSIS — S80212A Abrasion, left knee, initial encounter: Secondary | ICD-10-CM | POA: Diagnosis not present

## 2018-06-06 DIAGNOSIS — Z7401 Bed confinement status: Secondary | ICD-10-CM | POA: Diagnosis not present

## 2018-06-06 DIAGNOSIS — S60411A Abrasion of left index finger, initial encounter: Secondary | ICD-10-CM | POA: Diagnosis not present

## 2018-06-06 DIAGNOSIS — Y92122 Bedroom in nursing home as the place of occurrence of the external cause: Secondary | ICD-10-CM | POA: Diagnosis not present

## 2018-06-06 DIAGNOSIS — I10 Essential (primary) hypertension: Secondary | ICD-10-CM | POA: Diagnosis not present

## 2018-06-06 DIAGNOSIS — Z87891 Personal history of nicotine dependence: Secondary | ICD-10-CM | POA: Diagnosis not present

## 2018-06-06 DIAGNOSIS — S81012A Laceration without foreign body, left knee, initial encounter: Secondary | ICD-10-CM | POA: Diagnosis not present

## 2018-06-06 DIAGNOSIS — Y999 Unspecified external cause status: Secondary | ICD-10-CM | POA: Insufficient documentation

## 2018-06-06 DIAGNOSIS — M2550 Pain in unspecified joint: Secondary | ICD-10-CM | POA: Diagnosis not present

## 2018-06-06 DIAGNOSIS — R404 Transient alteration of awareness: Secondary | ICD-10-CM | POA: Diagnosis not present

## 2018-06-06 DIAGNOSIS — R4182 Altered mental status, unspecified: Secondary | ICD-10-CM | POA: Diagnosis not present

## 2018-06-06 DIAGNOSIS — F29 Unspecified psychosis not due to a substance or known physiological condition: Secondary | ICD-10-CM | POA: Diagnosis not present

## 2018-06-06 DIAGNOSIS — W19XXXA Unspecified fall, initial encounter: Secondary | ICD-10-CM

## 2018-06-06 DIAGNOSIS — S8992XA Unspecified injury of left lower leg, initial encounter: Secondary | ICD-10-CM | POA: Diagnosis present

## 2018-06-06 LAB — URINALYSIS, ROUTINE W REFLEX MICROSCOPIC
Bilirubin Urine: NEGATIVE
Glucose, UA: NEGATIVE mg/dL
Hgb urine dipstick: NEGATIVE
Ketones, ur: 5 mg/dL — AB
Leukocytes, UA: NEGATIVE
Nitrite: NEGATIVE
Protein, ur: NEGATIVE mg/dL
Specific Gravity, Urine: 1.027 (ref 1.005–1.030)
pH: 5 (ref 5.0–8.0)

## 2018-06-06 LAB — CBG MONITORING, ED: Glucose-Capillary: 108 mg/dL — ABNORMAL HIGH (ref 70–99)

## 2018-06-06 NOTE — ED Provider Notes (Signed)
[facility] DEPT Provider Note   CSN: [id number] Arrival date & time: 06/06/18  0859     History   Chief Complaint Chief Complaint  Patient presents with  . Fall    HPI Carl Rios is a 84 y.o. male.  HPI   84 year old male sent for evaluation after a fall.  Patient states he was jumping out of bed he lost his balance.  States that have been early this morning.  He was found by staff milligrams next to his bed.  He has some small skin tears to his left hand and left knee.  He denies any pain.  He is not anticoagulated.  Past Medical History:  Diagnosis Date  . AAA (abdominal aortic aneurysm) without rupture ([place])   . CVA (cerebral infarction) 03/2014   right PCA CVA  . Diverticulosis   . Diverticulosis   . Dyslipidemia   . GERD (gastroesophageal reflux disease)   . Hearing difficulty   . Hemorrhoids   . Hiatal hernia 1960  . History of colon polyps   . History of prostatitis   . Hx of adenomatous colonic polyps   . Hyperlipidemia   . Lower GI bleed 12/2011   secondary to diverticulosis  . Macular degeneration   . Obesity   . Peripheral neuropathy   . Peripheral vascular disease ([place])   . Premature ventricular contraction   . Rhabdomyolysis 03/2014   Due to prolonged fall  . Senile osteoporosis    as per documented on reclast infusion orders  . Tachycardia-bradycardia ([place])   . Unsteady gait     Patient Active Problem List   Diagnosis Date Noted  . CAP (community acquired pneumonia) 04/05/2018  . Fall   . Paroxysmal atrial tachycardia ([place])   . Orthostasis 10/16/2017  . Gait difficulty 10/16/2017  . History of CVA (cerebrovascular accident) 10/16/2017  . Pressure injury of skin 09/09/2017  . Syncope and collapse 09/08/2017  . PVC's (premature ventricular contractions) 09/23/2014  . Bradycardia 09/23/2014  . Depression 04/17/2014  . Recurrent falls 04/01/2014  . Paroxysmal SVT (supraventricular tachycardia) ([place])  04/01/2014  . Unsteady gait 04/01/2014  . CVA (cerebral infarction) 03/31/2014  . Acute renal failure ([place]) 03/27/2014  . Dehydration 03/27/2014  . Rhabdomyolysis 03/26/2014  . AAA (abdominal aortic aneurysm) without rupture ([place]) 08/14/2013  . Abnormality of gait 01/08/2013  . Other general symptoms(780.99) 01/08/2013  . Other vitamin B12 deficiency anemia 01/08/2013  . Polyneuropathy in other diseases classified elsewhere ([place]) 01/08/2013  . Abdominal aneurysm without mention of rupture 02/01/2012  . Hypokalemia 01/25/2012  . Anemia due to acute blood loss 01/24/2012  . Benign neoplasm of colon 01/24/2012  . Hypertension 01/19/2012  . Other and unspecified hyperlipidemia 01/18/2012  . Impaired fasting glucose 01/18/2012  . Unspecified hereditary and idiopathic peripheral neuropathy 01/18/2012  . Hypertonicity of bladder 01/18/2012  . Diverticulosis 01/18/2012  . Rectal bleeding 01/18/2012  . GERD 02/09/2010  . COLONIC POLYPS, ADENOMATOUS, HX OF 02/09/2010    Past Surgical History:  Procedure Laterality Date  . ABDOMINAL AORTIC ANEURYSM REPAIR    . ABDOMINAL AORTIC ENDOVASCULAR STENT GRAFT N/A 08/23/2013   Procedure: ABDOMINAL AORTIC ENDOVASCULAR STENT GRAFT;  Surgeon: Mal Misty, MD;  Location: [facility];  Service: Vascular;  Laterality: N/A;  . CATARACT EXTRACTION Bilateral 07/2009, 09/2009  . COLONOSCOPY  01/24/2012   Procedure: COLONOSCOPY;  Surgeon: Lafayette Dragon, MD;  Location: [facility];  Service: Endoscopy;  Laterality: N/A;  . INGUINAL HERNIA REPAIR    .  KNEE SURGERY     left knee in 1970's  . TESTICLE SURGERY  1949   Undescended testicle  . TONSILLECTOMY          Home Medications    Prior to Admission medications   Medication Sig Start Date End Date Taking? Authorizing Provider  acetaminophen (TYLENOL) 325 MG tablet Take 650 mg by mouth every 6 (six) hours as needed for mild pain.    Yes [provider]  aspirin EC 81 MG EC tablet Take 1 tablet  (81 mg total) by mouth daily. 03/29/14  Yes Marton Redwood, MD  DULoxetine (CYMBALTA) 30 MG capsule Take 1 capsule (30 mg total) by mouth daily. 02/19/14  Yes Kathrynn Ducking, MD  metoprolol tartrate (LOPRESSOR) 25 MG tablet Take 12.5 mg by mouth 2 (two) times daily.  05/22/14  Yes [provider]  midodrine (PROAMATINE) 2.5 MG tablet Take 1 tablet (2.5 mg total) 2 (two) times daily with a meal by mouth. 09/09/17  Yes Georgette Shell, MD  Multiple Vitamins-Minerals (MACULAR VITAMIN BENEFIT) TABS Take 1 tablet by mouth daily.    Yes [provider]  polyethylene glycol (MIRALAX / GLYCOLAX) packet Take 17 g daily as needed by mouth for mild constipation. 09/09/17  Yes Georgette Shell, MD  senna (SENOKOT) 8.6 MG TABS tablet Take 1 tablet (8.6 mg total) 2 (two) times daily by mouth. 09/09/17  Yes Georgette Shell, MD  tamsulosin (FLOMAX) 0.4 MG CAPS capsule Take 0.4 mg daily with supper by mouth. 08/01/17  Yes [provider]  cephALEXin (KEFLEX) 500 MG capsule Take 1 capsule (500 mg total) by mouth 3 (three) times daily. Patient not taking: Reported on 04/04/2018 03/14/18   Tanna Furry, MD    Family History Family History  Problem Relation Age of Onset  . Heart disease Father 58       Rheumatic heart  . Alcohol abuse Sister     Social History Social History   Tobacco Use  . Smoking status: Former Smoker    Years: 5.00    Types: Cigarettes    Last attempt to quit: 11/02/1955    Years since quitting: 62.6  . Smokeless tobacco: Never Used  Substance Use Topics  . Alcohol use: Yes    Alcohol/week: 1.8 oz    Types: 3 Standard drinks or equivalent per week    Comment: 3 drinks per week   (QUIT X 6 MONTHS)  . Drug use: No     Allergies   Patient has no known allergies.   Review of Systems Review of Systems  All systems reviewed and negative, other than as noted in HPI.  Physical Exam Updated Vital Signs BP (!) 144/95 (BP Location: Left Arm)    Pulse 92   Temp 97.8 F (36.6 C) (Oral)   Resp 12   SpO2 98%   Physical Exam  Constitutional: He appears well-developed and well-nourished. No distress.  HENT:  Head: Normocephalic and atraumatic.  Eyes: Conjunctivae are normal. Right eye exhibits no discharge. Left eye exhibits no discharge.  Neck: Neck supple.  Cardiovascular: Normal rate, regular rhythm and normal heart sounds. Exam reveals no gallop and no friction rub.  No murmur heard. Pulmonary/Chest: Effort normal and breath sounds normal. No respiratory distress.  Abdominal: Soft. He exhibits no distension. There is no tenderness.  Musculoskeletal: He exhibits no edema or tenderness.  No midline spinal tenderness and no significant bony tenderness of extremities. Tiny skin tears over L knee and L index  finger. Able to range L knee w/o apparent difficulty.   Neurological: He is alert.  Very hard of hearing. CN 2-12 otherwise intact. Strength 5/5 all extremities.   Skin: Skin is warm and dry.  Psychiatric: He has a normal mood and affect. His behavior is normal. Thought content normal.  Nursing note and vitals reviewed.    ED Treatments / Results  Labs (all labs ordered are listed, but only abnormal results are displayed) Labs Reviewed  CBG MONITORING, ED - Abnormal; Notable for the following components:      Result Value   Glucose-Capillary 108 (*)    All other components within normal limits  URINALYSIS, ROUTINE W REFLEX MICROSCOPIC    EKG None  Radiology No results found.  Procedures Procedures (including critical care time)  Medications Ordered in ED Medications - No data to display   Initial Impression / Assessment and Plan / ED Course  I have reviewed the triage vital signs and the nursing notes.  Pertinent labs & imaging results that were available during my care of the patient were reviewed by me and considered in my medical decision making (see chart for details).    89yM presenting after fall.  He says he fell out of bed. Small skin tear to L knee and small abrasion to L index finger. No significant bony tenderness. He denies pain to me. I really have a very low suspicion for serious traumatic injury and the fall sounds mechanical. He is not anticoagulated.   Final Clinical Impressions(s) / ED Diagnoses   Final diagnoses:  Fall, initial encounter  Multiple skin tears    ED Discharge Orders    None       Virgel Manifold, MD 06/09/18 0800

## 2018-06-06 NOTE — ED Notes (Signed)
[facility] called for transportation  

## 2018-06-06 NOTE — ED Triage Notes (Signed)
Patient found on floor this morning, unwitnessed fall, unknown downtown. Skin tears to left index finger and left knee, unknown blood thinners. Patient reports left lateral neck pain.   BP: 138/82 HR:90 RR: 18 O2: 99% CBG: 123

## 2018-06-06 NOTE — ED Notes (Signed)
Bed: DG38 Expected date: 06/06/18 Expected time: 8:55 AM Means of arrival: Ambulance Comments: Fall, from [facility] home

## 2018-06-08 DIAGNOSIS — F329 Major depressive disorder, single episode, unspecified: Secondary | ICD-10-CM | POA: Diagnosis not present

## 2018-06-08 DIAGNOSIS — R269 Unspecified abnormalities of gait and mobility: Secondary | ICD-10-CM | POA: Diagnosis not present

## 2018-06-08 DIAGNOSIS — T148XXA Other injury of unspecified body region, initial encounter: Secondary | ICD-10-CM | POA: Diagnosis not present

## 2018-06-08 DIAGNOSIS — Z79899 Other long term (current) drug therapy: Secondary | ICD-10-CM | POA: Diagnosis not present

## 2018-06-08 DIAGNOSIS — I10 Essential (primary) hypertension: Secondary | ICD-10-CM | POA: Diagnosis not present

## 2018-06-12 DIAGNOSIS — R269 Unspecified abnormalities of gait and mobility: Secondary | ICD-10-CM | POA: Diagnosis not present

## 2018-06-12 DIAGNOSIS — Z79899 Other long term (current) drug therapy: Secondary | ICD-10-CM | POA: Diagnosis not present

## 2018-06-12 DIAGNOSIS — H1030 Unspecified acute conjunctivitis, unspecified eye: Secondary | ICD-10-CM | POA: Diagnosis not present

## 2018-06-12 DIAGNOSIS — F0150 Vascular dementia without behavioral disturbance: Secondary | ICD-10-CM | POA: Diagnosis not present

## 2018-06-12 DIAGNOSIS — T07XXXA Unspecified multiple injuries, initial encounter: Secondary | ICD-10-CM | POA: Diagnosis not present

## 2018-06-21 DIAGNOSIS — I739 Peripheral vascular disease, unspecified: Secondary | ICD-10-CM | POA: Diagnosis not present

## 2018-06-21 DIAGNOSIS — Z8701 Personal history of pneumonia (recurrent): Secondary | ICD-10-CM | POA: Diagnosis not present

## 2018-06-21 DIAGNOSIS — G629 Polyneuropathy, unspecified: Secondary | ICD-10-CM | POA: Diagnosis not present

## 2018-06-21 DIAGNOSIS — N400 Enlarged prostate without lower urinary tract symptoms: Secondary | ICD-10-CM | POA: Diagnosis not present

## 2018-06-21 DIAGNOSIS — G894 Chronic pain syndrome: Secondary | ICD-10-CM | POA: Diagnosis not present

## 2018-06-21 DIAGNOSIS — F0150 Vascular dementia without behavioral disturbance: Secondary | ICD-10-CM | POA: Diagnosis not present

## 2018-06-21 DIAGNOSIS — M810 Age-related osteoporosis without current pathological fracture: Secondary | ICD-10-CM | POA: Diagnosis not present

## 2018-06-21 DIAGNOSIS — Z87891 Personal history of nicotine dependence: Secondary | ICD-10-CM | POA: Diagnosis not present

## 2018-06-21 DIAGNOSIS — I69318 Other symptoms and signs involving cognitive functions following cerebral infarction: Secondary | ICD-10-CM | POA: Diagnosis not present

## 2018-07-27 DIAGNOSIS — I639 Cerebral infarction, unspecified: Secondary | ICD-10-CM | POA: Diagnosis not present

## 2018-07-27 DIAGNOSIS — F0150 Vascular dementia without behavioral disturbance: Secondary | ICD-10-CM | POA: Diagnosis not present

## 2018-07-27 DIAGNOSIS — R269 Unspecified abnormalities of gait and mobility: Secondary | ICD-10-CM | POA: Diagnosis not present

## 2018-07-27 DIAGNOSIS — G894 Chronic pain syndrome: Secondary | ICD-10-CM | POA: Diagnosis not present

## 2018-08-07 DIAGNOSIS — I951 Orthostatic hypotension: Secondary | ICD-10-CM | POA: Diagnosis not present

## 2018-08-07 DIAGNOSIS — R4189 Other symptoms and signs involving cognitive functions and awareness: Secondary | ICD-10-CM | POA: Diagnosis not present

## 2018-08-07 DIAGNOSIS — I679 Cerebrovascular disease, unspecified: Secondary | ICD-10-CM | POA: Diagnosis not present

## 2018-08-07 DIAGNOSIS — H3530 Unspecified macular degeneration: Secondary | ICD-10-CM | POA: Diagnosis not present

## 2018-08-18 DIAGNOSIS — R1310 Dysphagia, unspecified: Secondary | ICD-10-CM | POA: Diagnosis not present

## 2018-08-18 DIAGNOSIS — E785 Hyperlipidemia, unspecified: Secondary | ICD-10-CM | POA: Diagnosis not present

## 2018-08-18 DIAGNOSIS — I714 Abdominal aortic aneurysm, without rupture: Secondary | ICD-10-CM | POA: Diagnosis not present

## 2018-08-18 DIAGNOSIS — J690 Pneumonitis due to inhalation of food and vomit: Secondary | ICD-10-CM | POA: Diagnosis not present

## 2018-08-18 DIAGNOSIS — I10 Essential (primary) hypertension: Secondary | ICD-10-CM | POA: Diagnosis not present

## 2018-08-18 DIAGNOSIS — E46 Unspecified protein-calorie malnutrition: Secondary | ICD-10-CM | POA: Diagnosis not present

## 2018-08-18 DIAGNOSIS — I739 Peripheral vascular disease, unspecified: Secondary | ICD-10-CM | POA: Diagnosis not present

## 2018-08-18 DIAGNOSIS — F0151 Vascular dementia with behavioral disturbance: Secondary | ICD-10-CM | POA: Diagnosis not present

## 2018-08-18 DIAGNOSIS — F339 Major depressive disorder, recurrent, unspecified: Secondary | ICD-10-CM | POA: Diagnosis not present

## 2018-08-18 DIAGNOSIS — N401 Enlarged prostate with lower urinary tract symptoms: Secondary | ICD-10-CM | POA: Diagnosis not present

## 2018-08-18 DIAGNOSIS — H3530 Unspecified macular degeneration: Secondary | ICD-10-CM | POA: Diagnosis not present

## 2018-08-18 DIAGNOSIS — I672 Cerebral atherosclerosis: Secondary | ICD-10-CM | POA: Diagnosis not present

## 2018-08-18 DIAGNOSIS — I679 Cerebrovascular disease, unspecified: Secondary | ICD-10-CM | POA: Diagnosis not present

## 2018-08-21 DIAGNOSIS — F0151 Vascular dementia with behavioral disturbance: Secondary | ICD-10-CM | POA: Diagnosis not present

## 2018-08-21 DIAGNOSIS — E46 Unspecified protein-calorie malnutrition: Secondary | ICD-10-CM | POA: Diagnosis not present

## 2018-08-21 DIAGNOSIS — R1310 Dysphagia, unspecified: Secondary | ICD-10-CM | POA: Diagnosis not present

## 2018-08-21 DIAGNOSIS — J690 Pneumonitis due to inhalation of food and vomit: Secondary | ICD-10-CM | POA: Diagnosis not present

## 2018-08-21 DIAGNOSIS — I679 Cerebrovascular disease, unspecified: Secondary | ICD-10-CM | POA: Diagnosis not present

## 2018-08-21 DIAGNOSIS — I672 Cerebral atherosclerosis: Secondary | ICD-10-CM | POA: Diagnosis not present

## 2018-08-22 DIAGNOSIS — R1310 Dysphagia, unspecified: Secondary | ICD-10-CM | POA: Diagnosis not present

## 2018-08-22 DIAGNOSIS — I679 Cerebrovascular disease, unspecified: Secondary | ICD-10-CM | POA: Diagnosis not present

## 2018-08-22 DIAGNOSIS — E46 Unspecified protein-calorie malnutrition: Secondary | ICD-10-CM | POA: Diagnosis not present

## 2018-08-22 DIAGNOSIS — J690 Pneumonitis due to inhalation of food and vomit: Secondary | ICD-10-CM | POA: Diagnosis not present

## 2018-08-22 DIAGNOSIS — I672 Cerebral atherosclerosis: Secondary | ICD-10-CM | POA: Diagnosis not present

## 2018-08-22 DIAGNOSIS — F0151 Vascular dementia with behavioral disturbance: Secondary | ICD-10-CM | POA: Diagnosis not present

## 2018-08-25 DIAGNOSIS — F0151 Vascular dementia with behavioral disturbance: Secondary | ICD-10-CM | POA: Diagnosis not present

## 2018-08-25 DIAGNOSIS — I679 Cerebrovascular disease, unspecified: Secondary | ICD-10-CM | POA: Diagnosis not present

## 2018-08-25 DIAGNOSIS — E46 Unspecified protein-calorie malnutrition: Secondary | ICD-10-CM | POA: Diagnosis not present

## 2018-08-25 DIAGNOSIS — R1310 Dysphagia, unspecified: Secondary | ICD-10-CM | POA: Diagnosis not present

## 2018-08-25 DIAGNOSIS — I672 Cerebral atherosclerosis: Secondary | ICD-10-CM | POA: Diagnosis not present

## 2018-08-25 DIAGNOSIS — J690 Pneumonitis due to inhalation of food and vomit: Secondary | ICD-10-CM | POA: Diagnosis not present

## 2018-08-28 DIAGNOSIS — I679 Cerebrovascular disease, unspecified: Secondary | ICD-10-CM | POA: Diagnosis not present

## 2018-08-28 DIAGNOSIS — F0151 Vascular dementia with behavioral disturbance: Secondary | ICD-10-CM | POA: Diagnosis not present

## 2018-08-28 DIAGNOSIS — J690 Pneumonitis due to inhalation of food and vomit: Secondary | ICD-10-CM | POA: Diagnosis not present

## 2018-08-28 DIAGNOSIS — E46 Unspecified protein-calorie malnutrition: Secondary | ICD-10-CM | POA: Diagnosis not present

## 2018-08-28 DIAGNOSIS — I672 Cerebral atherosclerosis: Secondary | ICD-10-CM | POA: Diagnosis not present

## 2018-08-28 DIAGNOSIS — R1310 Dysphagia, unspecified: Secondary | ICD-10-CM | POA: Diagnosis not present

## 2018-08-31 DIAGNOSIS — I679 Cerebrovascular disease, unspecified: Secondary | ICD-10-CM | POA: Diagnosis not present

## 2018-08-31 DIAGNOSIS — R1310 Dysphagia, unspecified: Secondary | ICD-10-CM | POA: Diagnosis not present

## 2018-08-31 DIAGNOSIS — J690 Pneumonitis due to inhalation of food and vomit: Secondary | ICD-10-CM | POA: Diagnosis not present

## 2018-08-31 DIAGNOSIS — I672 Cerebral atherosclerosis: Secondary | ICD-10-CM | POA: Diagnosis not present

## 2018-08-31 DIAGNOSIS — E46 Unspecified protein-calorie malnutrition: Secondary | ICD-10-CM | POA: Diagnosis not present

## 2018-08-31 DIAGNOSIS — F0151 Vascular dementia with behavioral disturbance: Secondary | ICD-10-CM | POA: Diagnosis not present

## 2018-09-01 DIAGNOSIS — N401 Enlarged prostate with lower urinary tract symptoms: Secondary | ICD-10-CM | POA: Diagnosis not present

## 2018-09-01 DIAGNOSIS — R1310 Dysphagia, unspecified: Secondary | ICD-10-CM | POA: Diagnosis not present

## 2018-09-01 DIAGNOSIS — I739 Peripheral vascular disease, unspecified: Secondary | ICD-10-CM | POA: Diagnosis not present

## 2018-09-01 DIAGNOSIS — I672 Cerebral atherosclerosis: Secondary | ICD-10-CM | POA: Diagnosis not present

## 2018-09-01 DIAGNOSIS — E46 Unspecified protein-calorie malnutrition: Secondary | ICD-10-CM | POA: Diagnosis not present

## 2018-09-01 DIAGNOSIS — I679 Cerebrovascular disease, unspecified: Secondary | ICD-10-CM | POA: Diagnosis not present

## 2018-09-01 DIAGNOSIS — F0151 Vascular dementia with behavioral disturbance: Secondary | ICD-10-CM | POA: Diagnosis not present

## 2018-09-01 DIAGNOSIS — E785 Hyperlipidemia, unspecified: Secondary | ICD-10-CM | POA: Diagnosis not present

## 2018-09-01 DIAGNOSIS — I714 Abdominal aortic aneurysm, without rupture: Secondary | ICD-10-CM | POA: Diagnosis not present

## 2018-09-01 DIAGNOSIS — I10 Essential (primary) hypertension: Secondary | ICD-10-CM | POA: Diagnosis not present

## 2018-09-01 DIAGNOSIS — H3530 Unspecified macular degeneration: Secondary | ICD-10-CM | POA: Diagnosis not present

## 2018-09-01 DIAGNOSIS — F339 Major depressive disorder, recurrent, unspecified: Secondary | ICD-10-CM | POA: Diagnosis not present

## 2018-09-01 DIAGNOSIS — J690 Pneumonitis due to inhalation of food and vomit: Secondary | ICD-10-CM | POA: Diagnosis not present

## 2018-09-04 DIAGNOSIS — I672 Cerebral atherosclerosis: Secondary | ICD-10-CM | POA: Diagnosis not present

## 2018-09-04 DIAGNOSIS — F0151 Vascular dementia with behavioral disturbance: Secondary | ICD-10-CM | POA: Diagnosis not present

## 2018-09-04 DIAGNOSIS — J690 Pneumonitis due to inhalation of food and vomit: Secondary | ICD-10-CM | POA: Diagnosis not present

## 2018-09-04 DIAGNOSIS — I679 Cerebrovascular disease, unspecified: Secondary | ICD-10-CM | POA: Diagnosis not present

## 2018-09-04 DIAGNOSIS — E46 Unspecified protein-calorie malnutrition: Secondary | ICD-10-CM | POA: Diagnosis not present

## 2018-09-04 DIAGNOSIS — R1310 Dysphagia, unspecified: Secondary | ICD-10-CM | POA: Diagnosis not present

## 2018-09-05 DIAGNOSIS — F0151 Vascular dementia with behavioral disturbance: Secondary | ICD-10-CM | POA: Diagnosis not present

## 2018-09-05 DIAGNOSIS — I679 Cerebrovascular disease, unspecified: Secondary | ICD-10-CM | POA: Diagnosis not present

## 2018-09-05 DIAGNOSIS — I672 Cerebral atherosclerosis: Secondary | ICD-10-CM | POA: Diagnosis not present

## 2018-09-05 DIAGNOSIS — E46 Unspecified protein-calorie malnutrition: Secondary | ICD-10-CM | POA: Diagnosis not present

## 2018-09-05 DIAGNOSIS — J690 Pneumonitis due to inhalation of food and vomit: Secondary | ICD-10-CM | POA: Diagnosis not present

## 2018-09-05 DIAGNOSIS — R1310 Dysphagia, unspecified: Secondary | ICD-10-CM | POA: Diagnosis not present

## 2018-09-07 DIAGNOSIS — I679 Cerebrovascular disease, unspecified: Secondary | ICD-10-CM | POA: Diagnosis not present

## 2018-09-07 DIAGNOSIS — R1310 Dysphagia, unspecified: Secondary | ICD-10-CM | POA: Diagnosis not present

## 2018-09-07 DIAGNOSIS — E46 Unspecified protein-calorie malnutrition: Secondary | ICD-10-CM | POA: Diagnosis not present

## 2018-09-07 DIAGNOSIS — F0151 Vascular dementia with behavioral disturbance: Secondary | ICD-10-CM | POA: Diagnosis not present

## 2018-09-07 DIAGNOSIS — I672 Cerebral atherosclerosis: Secondary | ICD-10-CM | POA: Diagnosis not present

## 2018-09-07 DIAGNOSIS — J690 Pneumonitis due to inhalation of food and vomit: Secondary | ICD-10-CM | POA: Diagnosis not present

## 2018-09-08 DIAGNOSIS — F0151 Vascular dementia with behavioral disturbance: Secondary | ICD-10-CM | POA: Diagnosis not present

## 2018-09-08 DIAGNOSIS — I672 Cerebral atherosclerosis: Secondary | ICD-10-CM | POA: Diagnosis not present

## 2018-09-08 DIAGNOSIS — Z23 Encounter for immunization: Secondary | ICD-10-CM | POA: Diagnosis not present

## 2018-09-08 DIAGNOSIS — E46 Unspecified protein-calorie malnutrition: Secondary | ICD-10-CM | POA: Diagnosis not present

## 2018-09-08 DIAGNOSIS — R1310 Dysphagia, unspecified: Secondary | ICD-10-CM | POA: Diagnosis not present

## 2018-09-08 DIAGNOSIS — I679 Cerebrovascular disease, unspecified: Secondary | ICD-10-CM | POA: Diagnosis not present

## 2018-09-08 DIAGNOSIS — J690 Pneumonitis due to inhalation of food and vomit: Secondary | ICD-10-CM | POA: Diagnosis not present

## 2018-09-11 DIAGNOSIS — F331 Major depressive disorder, recurrent, moderate: Secondary | ICD-10-CM | POA: Diagnosis not present

## 2018-09-11 DIAGNOSIS — R1310 Dysphagia, unspecified: Secondary | ICD-10-CM | POA: Diagnosis not present

## 2018-09-11 DIAGNOSIS — R269 Unspecified abnormalities of gait and mobility: Secondary | ICD-10-CM | POA: Diagnosis not present

## 2018-09-11 DIAGNOSIS — Z79899 Other long term (current) drug therapy: Secondary | ICD-10-CM | POA: Diagnosis not present

## 2018-09-11 DIAGNOSIS — F0151 Vascular dementia with behavioral disturbance: Secondary | ICD-10-CM | POA: Diagnosis not present

## 2018-09-11 DIAGNOSIS — E46 Unspecified protein-calorie malnutrition: Secondary | ICD-10-CM | POA: Diagnosis not present

## 2018-09-11 DIAGNOSIS — F0150 Vascular dementia without behavioral disturbance: Secondary | ICD-10-CM | POA: Diagnosis not present

## 2018-09-11 DIAGNOSIS — I679 Cerebrovascular disease, unspecified: Secondary | ICD-10-CM | POA: Diagnosis not present

## 2018-09-11 DIAGNOSIS — J690 Pneumonitis due to inhalation of food and vomit: Secondary | ICD-10-CM | POA: Diagnosis not present

## 2018-09-11 DIAGNOSIS — I639 Cerebral infarction, unspecified: Secondary | ICD-10-CM | POA: Diagnosis not present

## 2018-09-11 DIAGNOSIS — I672 Cerebral atherosclerosis: Secondary | ICD-10-CM | POA: Diagnosis not present

## 2018-09-14 DIAGNOSIS — R1310 Dysphagia, unspecified: Secondary | ICD-10-CM | POA: Diagnosis not present

## 2018-09-14 DIAGNOSIS — F0151 Vascular dementia with behavioral disturbance: Secondary | ICD-10-CM | POA: Diagnosis not present

## 2018-09-14 DIAGNOSIS — J690 Pneumonitis due to inhalation of food and vomit: Secondary | ICD-10-CM | POA: Diagnosis not present

## 2018-09-14 DIAGNOSIS — I672 Cerebral atherosclerosis: Secondary | ICD-10-CM | POA: Diagnosis not present

## 2018-09-14 DIAGNOSIS — E46 Unspecified protein-calorie malnutrition: Secondary | ICD-10-CM | POA: Diagnosis not present

## 2018-09-14 DIAGNOSIS — I679 Cerebrovascular disease, unspecified: Secondary | ICD-10-CM | POA: Diagnosis not present

## 2018-09-15 DIAGNOSIS — F0151 Vascular dementia with behavioral disturbance: Secondary | ICD-10-CM | POA: Diagnosis not present

## 2018-09-15 DIAGNOSIS — I672 Cerebral atherosclerosis: Secondary | ICD-10-CM | POA: Diagnosis not present

## 2018-09-15 DIAGNOSIS — E46 Unspecified protein-calorie malnutrition: Secondary | ICD-10-CM | POA: Diagnosis not present

## 2018-09-15 DIAGNOSIS — R1310 Dysphagia, unspecified: Secondary | ICD-10-CM | POA: Diagnosis not present

## 2018-09-15 DIAGNOSIS — I679 Cerebrovascular disease, unspecified: Secondary | ICD-10-CM | POA: Diagnosis not present

## 2018-09-15 DIAGNOSIS — J690 Pneumonitis due to inhalation of food and vomit: Secondary | ICD-10-CM | POA: Diagnosis not present

## 2018-09-17 DIAGNOSIS — I679 Cerebrovascular disease, unspecified: Secondary | ICD-10-CM | POA: Diagnosis not present

## 2018-09-17 DIAGNOSIS — I672 Cerebral atherosclerosis: Secondary | ICD-10-CM | POA: Diagnosis not present

## 2018-09-17 DIAGNOSIS — J690 Pneumonitis due to inhalation of food and vomit: Secondary | ICD-10-CM | POA: Diagnosis not present

## 2018-09-17 DIAGNOSIS — F0151 Vascular dementia with behavioral disturbance: Secondary | ICD-10-CM | POA: Diagnosis not present

## 2018-09-17 DIAGNOSIS — E46 Unspecified protein-calorie malnutrition: Secondary | ICD-10-CM | POA: Diagnosis not present

## 2018-09-17 DIAGNOSIS — R1310 Dysphagia, unspecified: Secondary | ICD-10-CM | POA: Diagnosis not present

## 2018-09-21 DIAGNOSIS — I679 Cerebrovascular disease, unspecified: Secondary | ICD-10-CM | POA: Diagnosis not present

## 2018-09-21 DIAGNOSIS — R1310 Dysphagia, unspecified: Secondary | ICD-10-CM | POA: Diagnosis not present

## 2018-09-21 DIAGNOSIS — J690 Pneumonitis due to inhalation of food and vomit: Secondary | ICD-10-CM | POA: Diagnosis not present

## 2018-09-21 DIAGNOSIS — F0151 Vascular dementia with behavioral disturbance: Secondary | ICD-10-CM | POA: Diagnosis not present

## 2018-09-21 DIAGNOSIS — E46 Unspecified protein-calorie malnutrition: Secondary | ICD-10-CM | POA: Diagnosis not present

## 2018-09-21 DIAGNOSIS — I672 Cerebral atherosclerosis: Secondary | ICD-10-CM | POA: Diagnosis not present

## 2018-09-22 DIAGNOSIS — I679 Cerebrovascular disease, unspecified: Secondary | ICD-10-CM | POA: Diagnosis not present

## 2018-09-22 DIAGNOSIS — R1310 Dysphagia, unspecified: Secondary | ICD-10-CM | POA: Diagnosis not present

## 2018-09-22 DIAGNOSIS — F0151 Vascular dementia with behavioral disturbance: Secondary | ICD-10-CM | POA: Diagnosis not present

## 2018-09-22 DIAGNOSIS — J690 Pneumonitis due to inhalation of food and vomit: Secondary | ICD-10-CM | POA: Diagnosis not present

## 2018-09-22 DIAGNOSIS — E46 Unspecified protein-calorie malnutrition: Secondary | ICD-10-CM | POA: Diagnosis not present

## 2018-09-22 DIAGNOSIS — I672 Cerebral atherosclerosis: Secondary | ICD-10-CM | POA: Diagnosis not present

## 2018-09-25 DIAGNOSIS — J690 Pneumonitis due to inhalation of food and vomit: Secondary | ICD-10-CM | POA: Diagnosis not present

## 2018-09-25 DIAGNOSIS — R1310 Dysphagia, unspecified: Secondary | ICD-10-CM | POA: Diagnosis not present

## 2018-09-25 DIAGNOSIS — F0151 Vascular dementia with behavioral disturbance: Secondary | ICD-10-CM | POA: Diagnosis not present

## 2018-09-25 DIAGNOSIS — E46 Unspecified protein-calorie malnutrition: Secondary | ICD-10-CM | POA: Diagnosis not present

## 2018-09-25 DIAGNOSIS — I679 Cerebrovascular disease, unspecified: Secondary | ICD-10-CM | POA: Diagnosis not present

## 2018-09-25 DIAGNOSIS — I672 Cerebral atherosclerosis: Secondary | ICD-10-CM | POA: Diagnosis not present

## 2018-09-29 DIAGNOSIS — I679 Cerebrovascular disease, unspecified: Secondary | ICD-10-CM | POA: Diagnosis not present

## 2018-09-29 DIAGNOSIS — R1310 Dysphagia, unspecified: Secondary | ICD-10-CM | POA: Diagnosis not present

## 2018-09-29 DIAGNOSIS — F0151 Vascular dementia with behavioral disturbance: Secondary | ICD-10-CM | POA: Diagnosis not present

## 2018-09-29 DIAGNOSIS — E46 Unspecified protein-calorie malnutrition: Secondary | ICD-10-CM | POA: Diagnosis not present

## 2018-09-29 DIAGNOSIS — I672 Cerebral atherosclerosis: Secondary | ICD-10-CM | POA: Diagnosis not present

## 2018-09-29 DIAGNOSIS — J690 Pneumonitis due to inhalation of food and vomit: Secondary | ICD-10-CM | POA: Diagnosis not present

## 2018-10-01 DIAGNOSIS — F0151 Vascular dementia with behavioral disturbance: Secondary | ICD-10-CM | POA: Diagnosis not present

## 2018-10-01 DIAGNOSIS — H3530 Unspecified macular degeneration: Secondary | ICD-10-CM | POA: Diagnosis not present

## 2018-10-01 DIAGNOSIS — I679 Cerebrovascular disease, unspecified: Secondary | ICD-10-CM | POA: Diagnosis not present

## 2018-10-01 DIAGNOSIS — R1310 Dysphagia, unspecified: Secondary | ICD-10-CM | POA: Diagnosis not present

## 2018-10-01 DIAGNOSIS — F339 Major depressive disorder, recurrent, unspecified: Secondary | ICD-10-CM | POA: Diagnosis not present

## 2018-10-01 DIAGNOSIS — I672 Cerebral atherosclerosis: Secondary | ICD-10-CM | POA: Diagnosis not present

## 2018-10-01 DIAGNOSIS — N401 Enlarged prostate with lower urinary tract symptoms: Secondary | ICD-10-CM | POA: Diagnosis not present

## 2018-10-01 DIAGNOSIS — I739 Peripheral vascular disease, unspecified: Secondary | ICD-10-CM | POA: Diagnosis not present

## 2018-10-01 DIAGNOSIS — I714 Abdominal aortic aneurysm, without rupture: Secondary | ICD-10-CM | POA: Diagnosis not present

## 2018-10-01 DIAGNOSIS — E46 Unspecified protein-calorie malnutrition: Secondary | ICD-10-CM | POA: Diagnosis not present

## 2018-10-01 DIAGNOSIS — E785 Hyperlipidemia, unspecified: Secondary | ICD-10-CM | POA: Diagnosis not present

## 2018-10-01 DIAGNOSIS — I10 Essential (primary) hypertension: Secondary | ICD-10-CM | POA: Diagnosis not present

## 2018-10-01 DIAGNOSIS — J690 Pneumonitis due to inhalation of food and vomit: Secondary | ICD-10-CM | POA: Diagnosis not present

## 2018-10-02 DIAGNOSIS — E46 Unspecified protein-calorie malnutrition: Secondary | ICD-10-CM | POA: Diagnosis not present

## 2018-10-02 DIAGNOSIS — I679 Cerebrovascular disease, unspecified: Secondary | ICD-10-CM | POA: Diagnosis not present

## 2018-10-02 DIAGNOSIS — J690 Pneumonitis due to inhalation of food and vomit: Secondary | ICD-10-CM | POA: Diagnosis not present

## 2018-10-02 DIAGNOSIS — R1310 Dysphagia, unspecified: Secondary | ICD-10-CM | POA: Diagnosis not present

## 2018-10-02 DIAGNOSIS — I672 Cerebral atherosclerosis: Secondary | ICD-10-CM | POA: Diagnosis not present

## 2018-10-02 DIAGNOSIS — F0151 Vascular dementia with behavioral disturbance: Secondary | ICD-10-CM | POA: Diagnosis not present

## 2018-10-05 DIAGNOSIS — J690 Pneumonitis due to inhalation of food and vomit: Secondary | ICD-10-CM | POA: Diagnosis not present

## 2018-10-05 DIAGNOSIS — F0151 Vascular dementia with behavioral disturbance: Secondary | ICD-10-CM | POA: Diagnosis not present

## 2018-10-05 DIAGNOSIS — R1310 Dysphagia, unspecified: Secondary | ICD-10-CM | POA: Diagnosis not present

## 2018-10-05 DIAGNOSIS — E46 Unspecified protein-calorie malnutrition: Secondary | ICD-10-CM | POA: Diagnosis not present

## 2018-10-05 DIAGNOSIS — I679 Cerebrovascular disease, unspecified: Secondary | ICD-10-CM | POA: Diagnosis not present

## 2018-10-05 DIAGNOSIS — I672 Cerebral atherosclerosis: Secondary | ICD-10-CM | POA: Diagnosis not present

## 2018-10-06 DIAGNOSIS — E46 Unspecified protein-calorie malnutrition: Secondary | ICD-10-CM | POA: Diagnosis not present

## 2018-10-06 DIAGNOSIS — I679 Cerebrovascular disease, unspecified: Secondary | ICD-10-CM | POA: Diagnosis not present

## 2018-10-06 DIAGNOSIS — F0151 Vascular dementia with behavioral disturbance: Secondary | ICD-10-CM | POA: Diagnosis not present

## 2018-10-06 DIAGNOSIS — R1310 Dysphagia, unspecified: Secondary | ICD-10-CM | POA: Diagnosis not present

## 2018-10-06 DIAGNOSIS — I672 Cerebral atherosclerosis: Secondary | ICD-10-CM | POA: Diagnosis not present

## 2018-10-06 DIAGNOSIS — J690 Pneumonitis due to inhalation of food and vomit: Secondary | ICD-10-CM | POA: Diagnosis not present

## 2018-10-09 DIAGNOSIS — R1310 Dysphagia, unspecified: Secondary | ICD-10-CM | POA: Diagnosis not present

## 2018-10-09 DIAGNOSIS — I679 Cerebrovascular disease, unspecified: Secondary | ICD-10-CM | POA: Diagnosis not present

## 2018-10-09 DIAGNOSIS — E46 Unspecified protein-calorie malnutrition: Secondary | ICD-10-CM | POA: Diagnosis not present

## 2018-10-09 DIAGNOSIS — F0151 Vascular dementia with behavioral disturbance: Secondary | ICD-10-CM | POA: Diagnosis not present

## 2018-10-09 DIAGNOSIS — J690 Pneumonitis due to inhalation of food and vomit: Secondary | ICD-10-CM | POA: Diagnosis not present

## 2018-10-09 DIAGNOSIS — I672 Cerebral atherosclerosis: Secondary | ICD-10-CM | POA: Diagnosis not present

## 2018-10-10 DIAGNOSIS — J690 Pneumonitis due to inhalation of food and vomit: Secondary | ICD-10-CM | POA: Diagnosis not present

## 2018-10-10 DIAGNOSIS — F0151 Vascular dementia with behavioral disturbance: Secondary | ICD-10-CM | POA: Diagnosis not present

## 2018-10-10 DIAGNOSIS — R1310 Dysphagia, unspecified: Secondary | ICD-10-CM | POA: Diagnosis not present

## 2018-10-10 DIAGNOSIS — I679 Cerebrovascular disease, unspecified: Secondary | ICD-10-CM | POA: Diagnosis not present

## 2018-10-10 DIAGNOSIS — E46 Unspecified protein-calorie malnutrition: Secondary | ICD-10-CM | POA: Diagnosis not present

## 2018-10-10 DIAGNOSIS — I672 Cerebral atherosclerosis: Secondary | ICD-10-CM | POA: Diagnosis not present

## 2018-10-12 DIAGNOSIS — I672 Cerebral atherosclerosis: Secondary | ICD-10-CM | POA: Diagnosis not present

## 2018-10-12 DIAGNOSIS — E46 Unspecified protein-calorie malnutrition: Secondary | ICD-10-CM | POA: Diagnosis not present

## 2018-10-12 DIAGNOSIS — J690 Pneumonitis due to inhalation of food and vomit: Secondary | ICD-10-CM | POA: Diagnosis not present

## 2018-10-12 DIAGNOSIS — F0151 Vascular dementia with behavioral disturbance: Secondary | ICD-10-CM | POA: Diagnosis not present

## 2018-10-12 DIAGNOSIS — R1310 Dysphagia, unspecified: Secondary | ICD-10-CM | POA: Diagnosis not present

## 2018-10-12 DIAGNOSIS — I679 Cerebrovascular disease, unspecified: Secondary | ICD-10-CM | POA: Diagnosis not present

## 2018-10-16 DIAGNOSIS — J690 Pneumonitis due to inhalation of food and vomit: Secondary | ICD-10-CM | POA: Diagnosis not present

## 2018-10-16 DIAGNOSIS — R269 Unspecified abnormalities of gait and mobility: Secondary | ICD-10-CM | POA: Diagnosis not present

## 2018-10-16 DIAGNOSIS — R1310 Dysphagia, unspecified: Secondary | ICD-10-CM | POA: Diagnosis not present

## 2018-10-16 DIAGNOSIS — Z7401 Bed confinement status: Secondary | ICD-10-CM | POA: Diagnosis not present

## 2018-10-16 DIAGNOSIS — I679 Cerebrovascular disease, unspecified: Secondary | ICD-10-CM | POA: Diagnosis not present

## 2018-10-16 DIAGNOSIS — F0151 Vascular dementia with behavioral disturbance: Secondary | ICD-10-CM | POA: Diagnosis not present

## 2018-10-16 DIAGNOSIS — E46 Unspecified protein-calorie malnutrition: Secondary | ICD-10-CM | POA: Diagnosis not present

## 2018-10-16 DIAGNOSIS — I672 Cerebral atherosclerosis: Secondary | ICD-10-CM | POA: Diagnosis not present

## 2018-10-16 DIAGNOSIS — K5790 Diverticulosis of intestine, part unspecified, without perforation or abscess without bleeding: Secondary | ICD-10-CM | POA: Diagnosis not present

## 2018-10-16 DIAGNOSIS — Z79899 Other long term (current) drug therapy: Secondary | ICD-10-CM | POA: Diagnosis not present

## 2018-10-16 DIAGNOSIS — K219 Gastro-esophageal reflux disease without esophagitis: Secondary | ICD-10-CM | POA: Diagnosis not present

## 2018-10-19 DIAGNOSIS — I679 Cerebrovascular disease, unspecified: Secondary | ICD-10-CM | POA: Diagnosis not present

## 2018-10-19 DIAGNOSIS — J690 Pneumonitis due to inhalation of food and vomit: Secondary | ICD-10-CM | POA: Diagnosis not present

## 2018-10-19 DIAGNOSIS — R1310 Dysphagia, unspecified: Secondary | ICD-10-CM | POA: Diagnosis not present

## 2018-10-19 DIAGNOSIS — I672 Cerebral atherosclerosis: Secondary | ICD-10-CM | POA: Diagnosis not present

## 2018-10-19 DIAGNOSIS — F0151 Vascular dementia with behavioral disturbance: Secondary | ICD-10-CM | POA: Diagnosis not present

## 2018-10-19 DIAGNOSIS — E46 Unspecified protein-calorie malnutrition: Secondary | ICD-10-CM | POA: Diagnosis not present

## 2018-10-22 DIAGNOSIS — F0151 Vascular dementia with behavioral disturbance: Secondary | ICD-10-CM | POA: Diagnosis not present

## 2018-10-22 DIAGNOSIS — I679 Cerebrovascular disease, unspecified: Secondary | ICD-10-CM | POA: Diagnosis not present

## 2018-10-22 DIAGNOSIS — R1310 Dysphagia, unspecified: Secondary | ICD-10-CM | POA: Diagnosis not present

## 2018-10-22 DIAGNOSIS — I672 Cerebral atherosclerosis: Secondary | ICD-10-CM | POA: Diagnosis not present

## 2018-10-22 DIAGNOSIS — E46 Unspecified protein-calorie malnutrition: Secondary | ICD-10-CM | POA: Diagnosis not present

## 2018-10-22 DIAGNOSIS — J690 Pneumonitis due to inhalation of food and vomit: Secondary | ICD-10-CM | POA: Diagnosis not present

## 2018-10-23 DIAGNOSIS — R1310 Dysphagia, unspecified: Secondary | ICD-10-CM | POA: Diagnosis not present

## 2018-10-23 DIAGNOSIS — J690 Pneumonitis due to inhalation of food and vomit: Secondary | ICD-10-CM | POA: Diagnosis not present

## 2018-10-23 DIAGNOSIS — I679 Cerebrovascular disease, unspecified: Secondary | ICD-10-CM | POA: Diagnosis not present

## 2018-10-23 DIAGNOSIS — F0151 Vascular dementia with behavioral disturbance: Secondary | ICD-10-CM | POA: Diagnosis not present

## 2018-10-23 DIAGNOSIS — I672 Cerebral atherosclerosis: Secondary | ICD-10-CM | POA: Diagnosis not present

## 2018-10-23 DIAGNOSIS — E46 Unspecified protein-calorie malnutrition: Secondary | ICD-10-CM | POA: Diagnosis not present

## 2018-10-27 DIAGNOSIS — J690 Pneumonitis due to inhalation of food and vomit: Secondary | ICD-10-CM | POA: Diagnosis not present

## 2018-10-27 DIAGNOSIS — E46 Unspecified protein-calorie malnutrition: Secondary | ICD-10-CM | POA: Diagnosis not present

## 2018-10-27 DIAGNOSIS — R1310 Dysphagia, unspecified: Secondary | ICD-10-CM | POA: Diagnosis not present

## 2018-10-27 DIAGNOSIS — I672 Cerebral atherosclerosis: Secondary | ICD-10-CM | POA: Diagnosis not present

## 2018-10-27 DIAGNOSIS — I679 Cerebrovascular disease, unspecified: Secondary | ICD-10-CM | POA: Diagnosis not present

## 2018-10-27 DIAGNOSIS — F0151 Vascular dementia with behavioral disturbance: Secondary | ICD-10-CM | POA: Diagnosis not present

## 2018-10-30 DIAGNOSIS — R1310 Dysphagia, unspecified: Secondary | ICD-10-CM | POA: Diagnosis not present

## 2018-10-30 DIAGNOSIS — I679 Cerebrovascular disease, unspecified: Secondary | ICD-10-CM | POA: Diagnosis not present

## 2018-10-30 DIAGNOSIS — E46 Unspecified protein-calorie malnutrition: Secondary | ICD-10-CM | POA: Diagnosis not present

## 2018-10-30 DIAGNOSIS — J690 Pneumonitis due to inhalation of food and vomit: Secondary | ICD-10-CM | POA: Diagnosis not present

## 2018-10-30 DIAGNOSIS — I672 Cerebral atherosclerosis: Secondary | ICD-10-CM | POA: Diagnosis not present

## 2018-10-30 DIAGNOSIS — F0151 Vascular dementia with behavioral disturbance: Secondary | ICD-10-CM | POA: Diagnosis not present

## 2019-05-12 IMAGING — CT CT CERVICAL SPINE W/O CM
4 of 7 series · 14 of 33 positions shown, 15 images · non-contrast
Comparison: CT of the head and cervical spine performed 03/14/2018,
and MRI of the brain performed 10/17/2017

CLINICAL DATA: Status post unwitnessed fall, with concern for head
or cervical spine injury. Initial encounter.

EXAM:
CT HEAD WITHOUT CONTRAST
CT CERVICAL SPINE WITHOUT CONTRAST
TECHNIQUE: Multidetector CT imaging of the head and cervical spine was
performed following the standard protocol without intravenous
contrast. Multiplanar CT image reconstructions of the cervical spine
were also generated.

[Series 6: head 3.0 mpr cor · coronal · 0.37mm/px · 2 of 80 slices shown]
[im 27/80  bone]
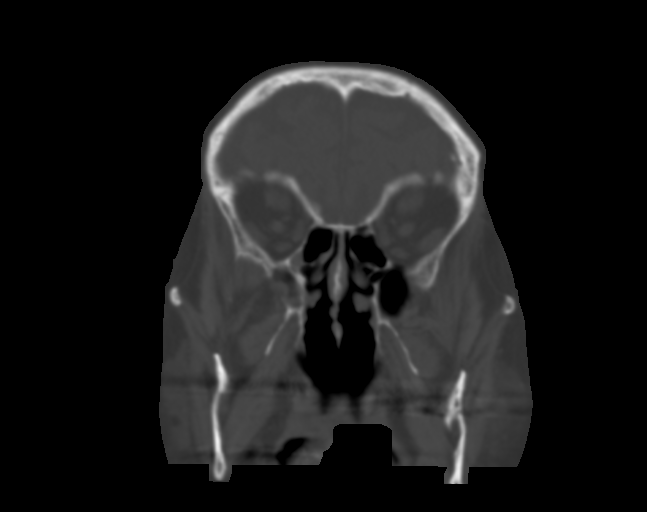
[im 53/80  bone]
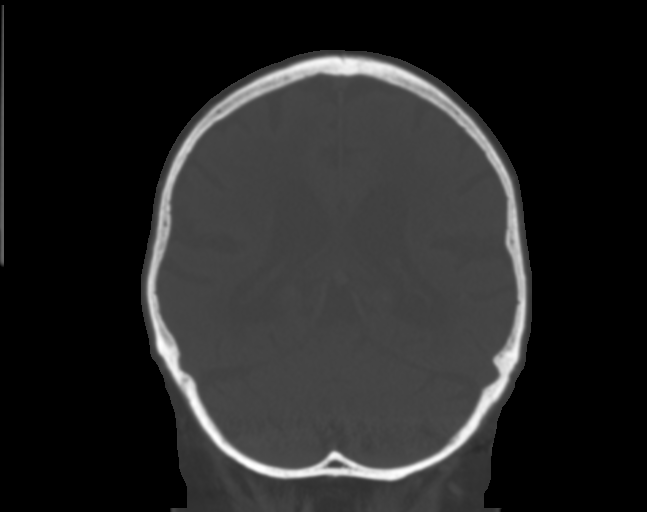

[Series 11: c_spine 2.0 st · axial · 0.42mm/px · z∈[-241,-125]mm · 4 of 98 slices shown, 5 images]
[im 20/98  soft-tissue]
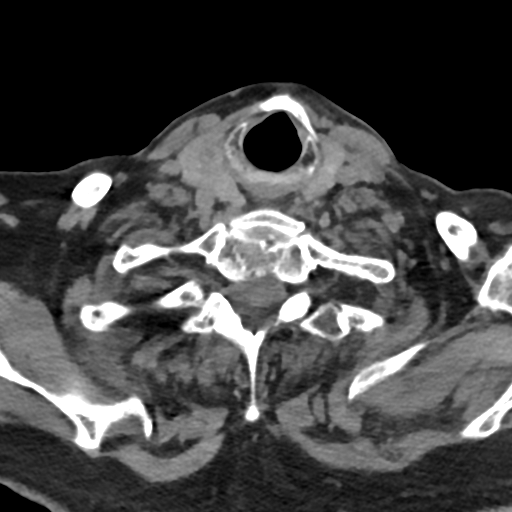
[im 20/98  bone]
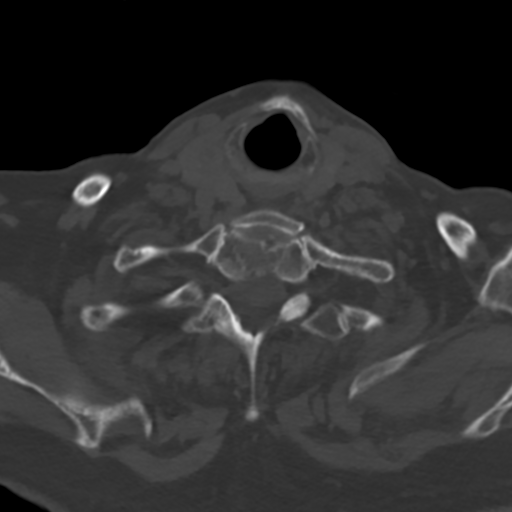
[im 39/98  bone]
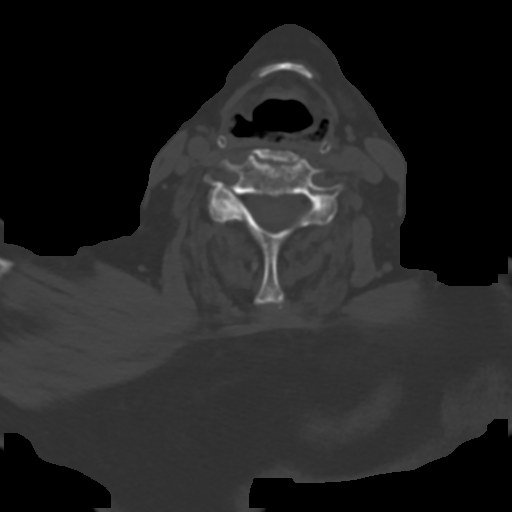
[im 59/98  bone]
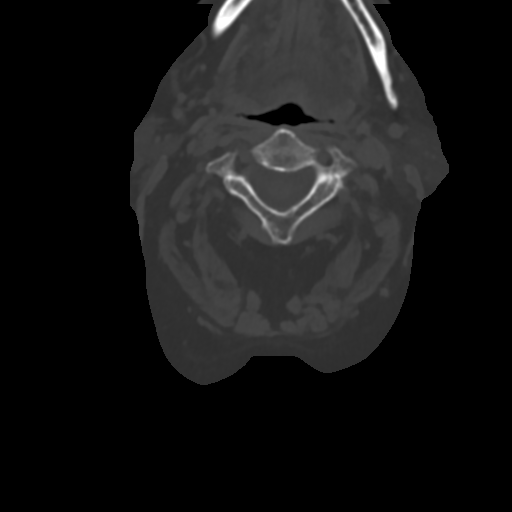
[im 78/98  bone]
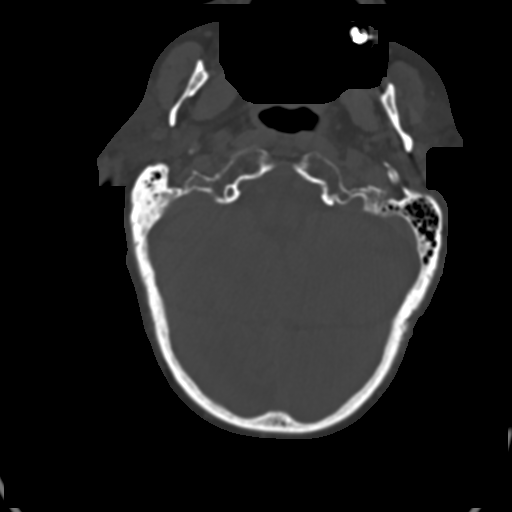

[Series 13: sagittal bone · sagittal · 0.27mm/px · 4 of 61 slices shown]
[im 13/61  bone]
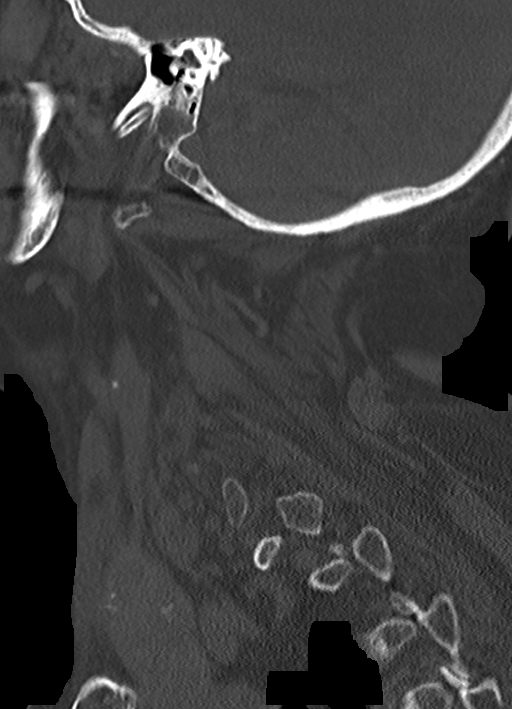
[im 25/61  bone]
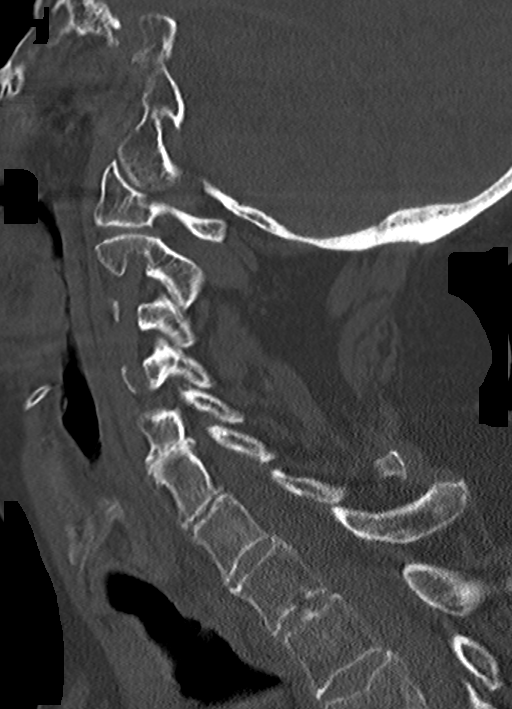
[im 37/61  bone]
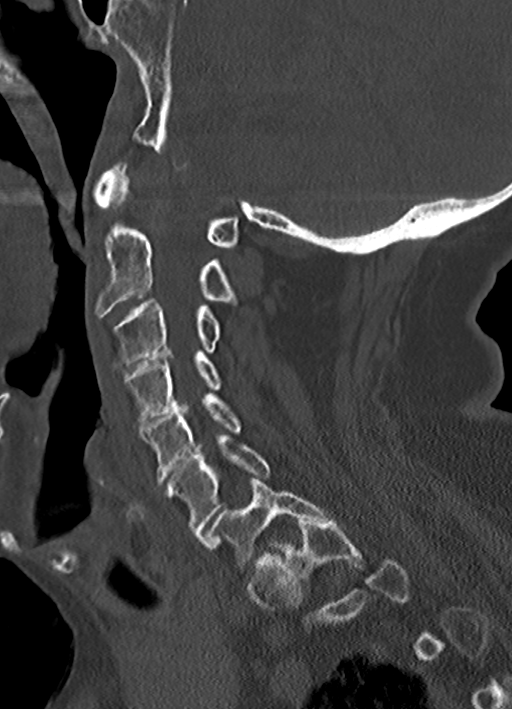
[im 49/61  bone]
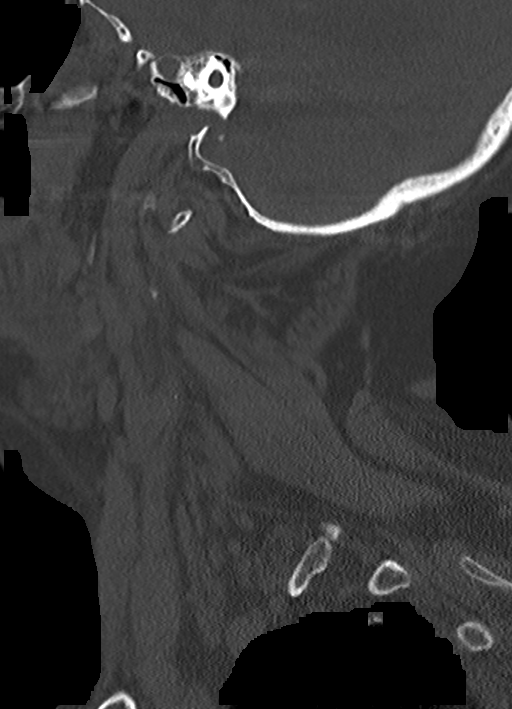

[Series 15: orthogonal axial st · axial · 0.21mm/px · z∈[-266,-161]mm · 4 of 98 slices shown]
[im 20/98  bone]
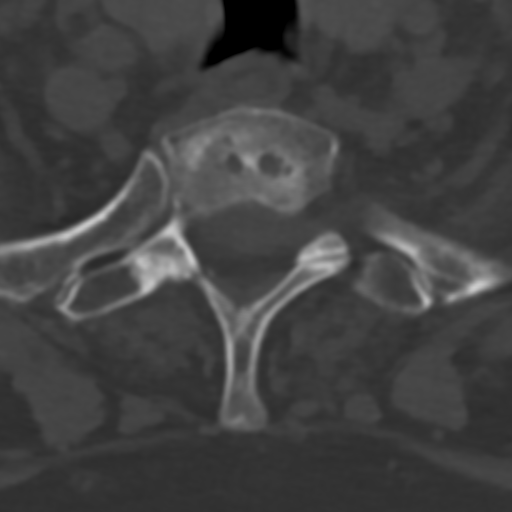
[im 39/98  bone]
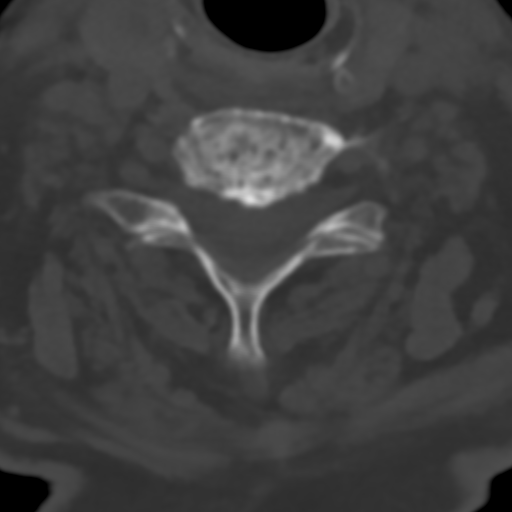
[im 59/98  bone]
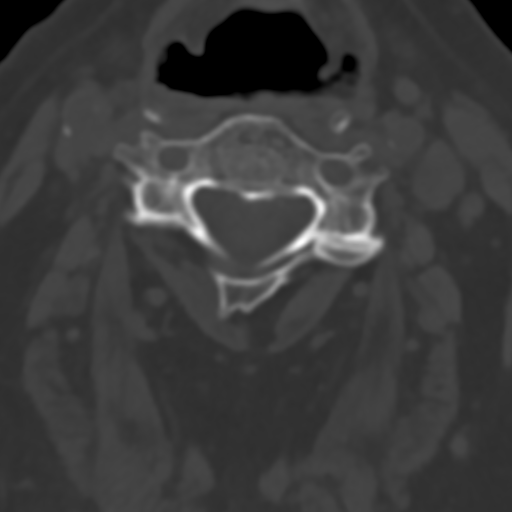
[im 78/98  bone]
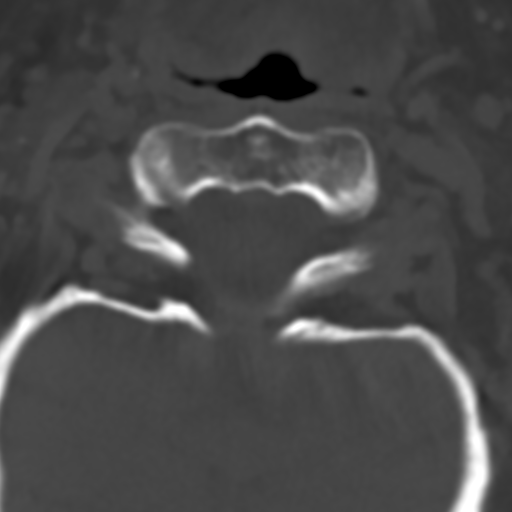

[14 of 33 positions shown; findings below may reference images not displayed]

FINDINGS: CT HEAD FINDINGS

Brain: No evidence of acute infarction, hemorrhage, hydrocephalus,
extra-axial collection or mass lesion / mass effect.

Prominence of the ventricles and sulci reflects moderately severe
cortical volume loss. Cerebellar atrophy is noted. Diffuse
periventricular and subcortical white matter change likely reflects
small vessel ischemic microangiopathy. Chronic lacunar infarcts are
noted at the corona radiata bilaterally.

The brainstem and fourth ventricle are within normal limits. The
basal ganglia are unremarkable in appearance. The cerebral
hemispheres demonstrate grossly normal gray-white differentiation.
No mass effect or midline shift is seen.

Vascular: No hyperdense vessel or unexpected calcification.

Skull: There is no evidence of fracture; visualized osseous
structures are unremarkable in appearance.

Sinuses/Orbits: The orbits are within normal limits. The paranasal
sinuses and mastoid air cells are well-aerated.

Other: No significant soft tissue abnormalities are seen.

CT CERVICAL SPINE FINDINGS

Alignment: Normal.

Skull base and vertebrae: No acute fracture. No primary bone lesion
or focal pathologic process.

Soft tissues and spinal canal: No prevertebral fluid or swelling. No
visible canal hematoma.

Disc levels: Multilevel disc space narrowing is noted along the
cervical spine, with underlying facet disease. Scattered anterior
and posterior disc osteophyte complexes are noted along the cervical
spine.

Upper chest: Mild scarring and atelectasis are noted at the lung
apices, with a trace left pleural effusion. Scattered calcifications
are noted at the thyroid gland, with mild nonspecific heterogeneity
noted bilaterally. There is mild calcification at the carotid
bifurcations bilaterally.

Other: No additional soft tissue abnormalities are seen.
IMPRESSION: 1. No evidence of traumatic intracranial injury or fracture.
2. No evidence of fracture or subluxation along the cervical spine.
3. Moderately severe cortical volume loss and diffuse small vessel
ischemic microangiopathy.
4. Chronic lacunar infarcts at the corona radiata bilaterally.
5. Mild degenerative change along the cervical spine.
6. Mild scarring and atelectasis at the lung apices, with a trace
left pleural effusion.
7. Mild calcification at the carotid bifurcations bilaterally.

## 2019-05-12 IMAGING — DX DG CHEST 2V
2 series · 2 of 2 positions shown · non-contrast
Comparison: Chest radiograph performed 03/14/2018

CLINICAL DATA: Status post unwitnessed fall, with concern for chest
injury. Initial encounter.

EXAM:
CHEST - 2 VIEW

[chest lat]
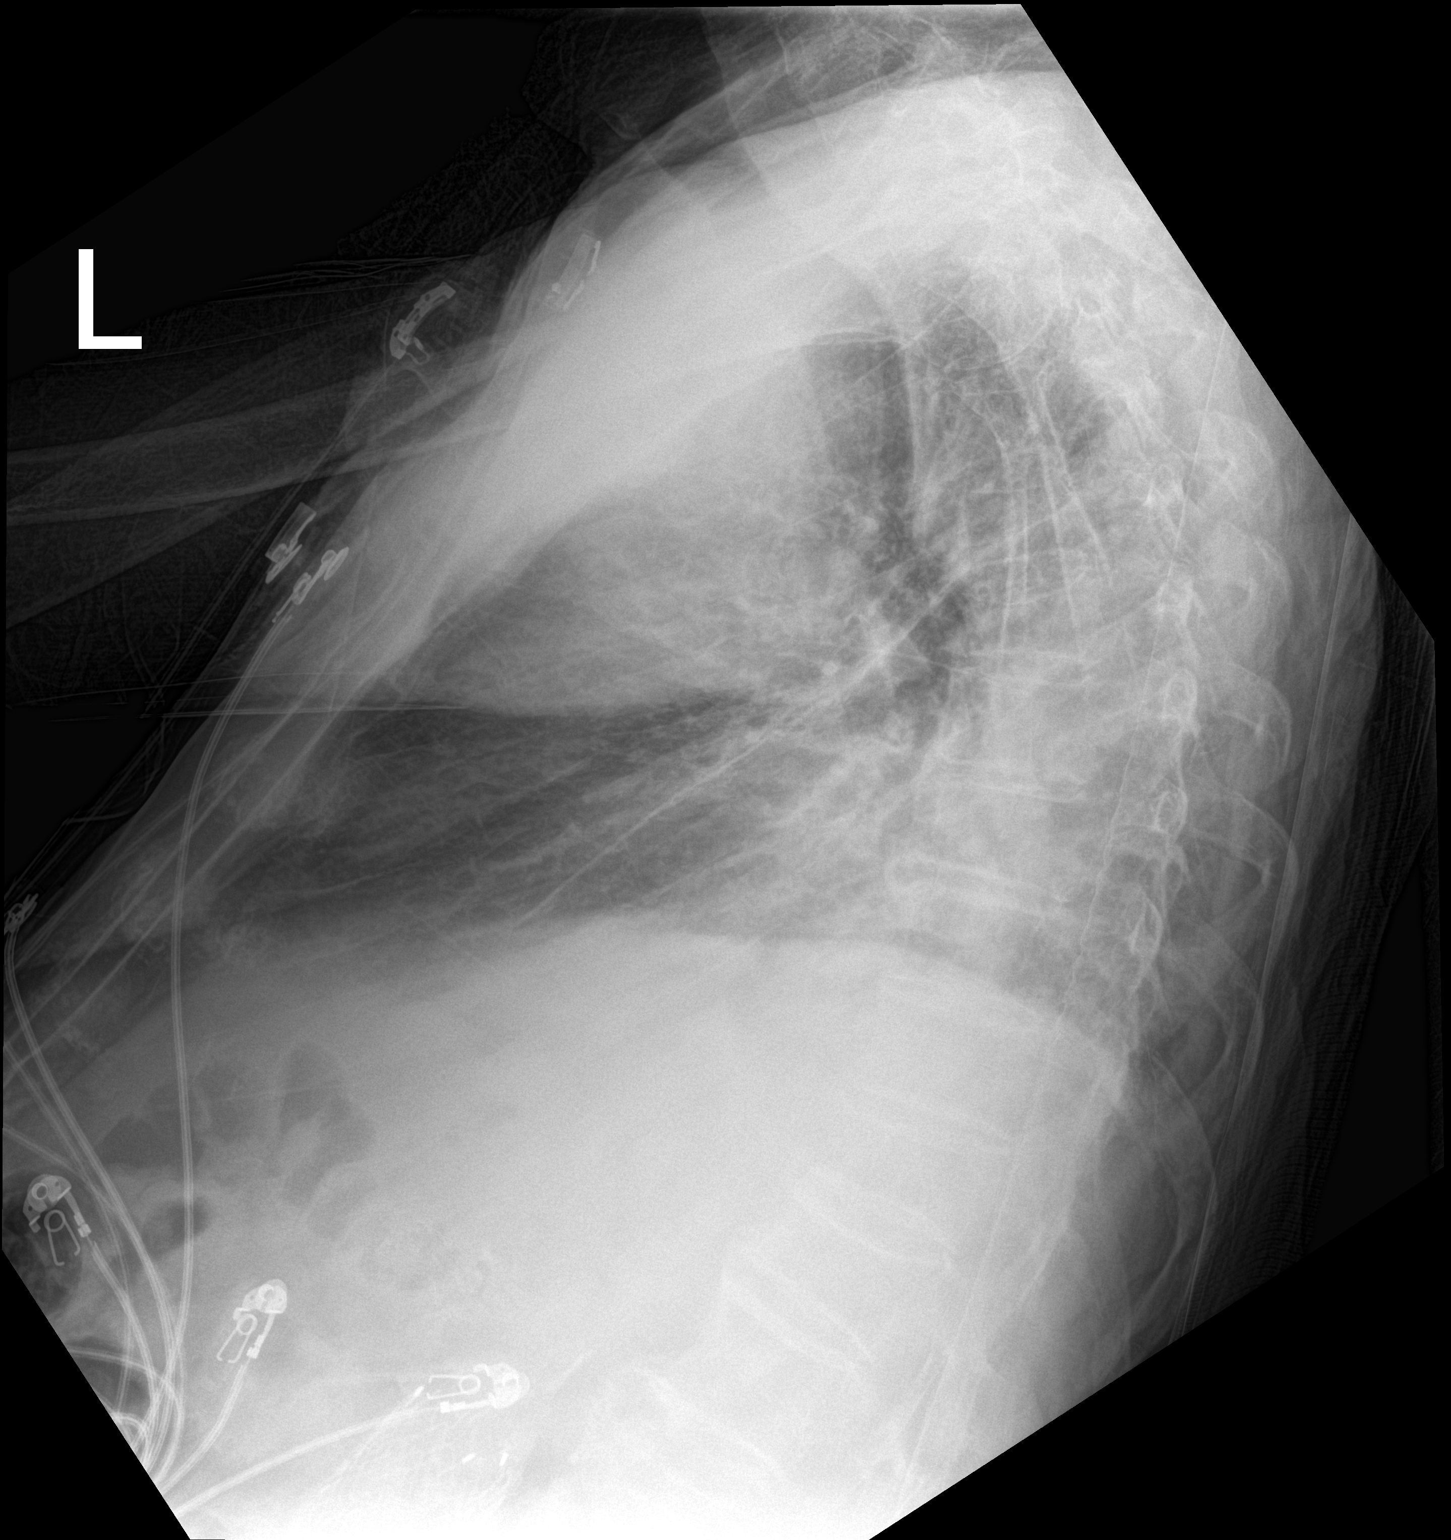

[chest ap]
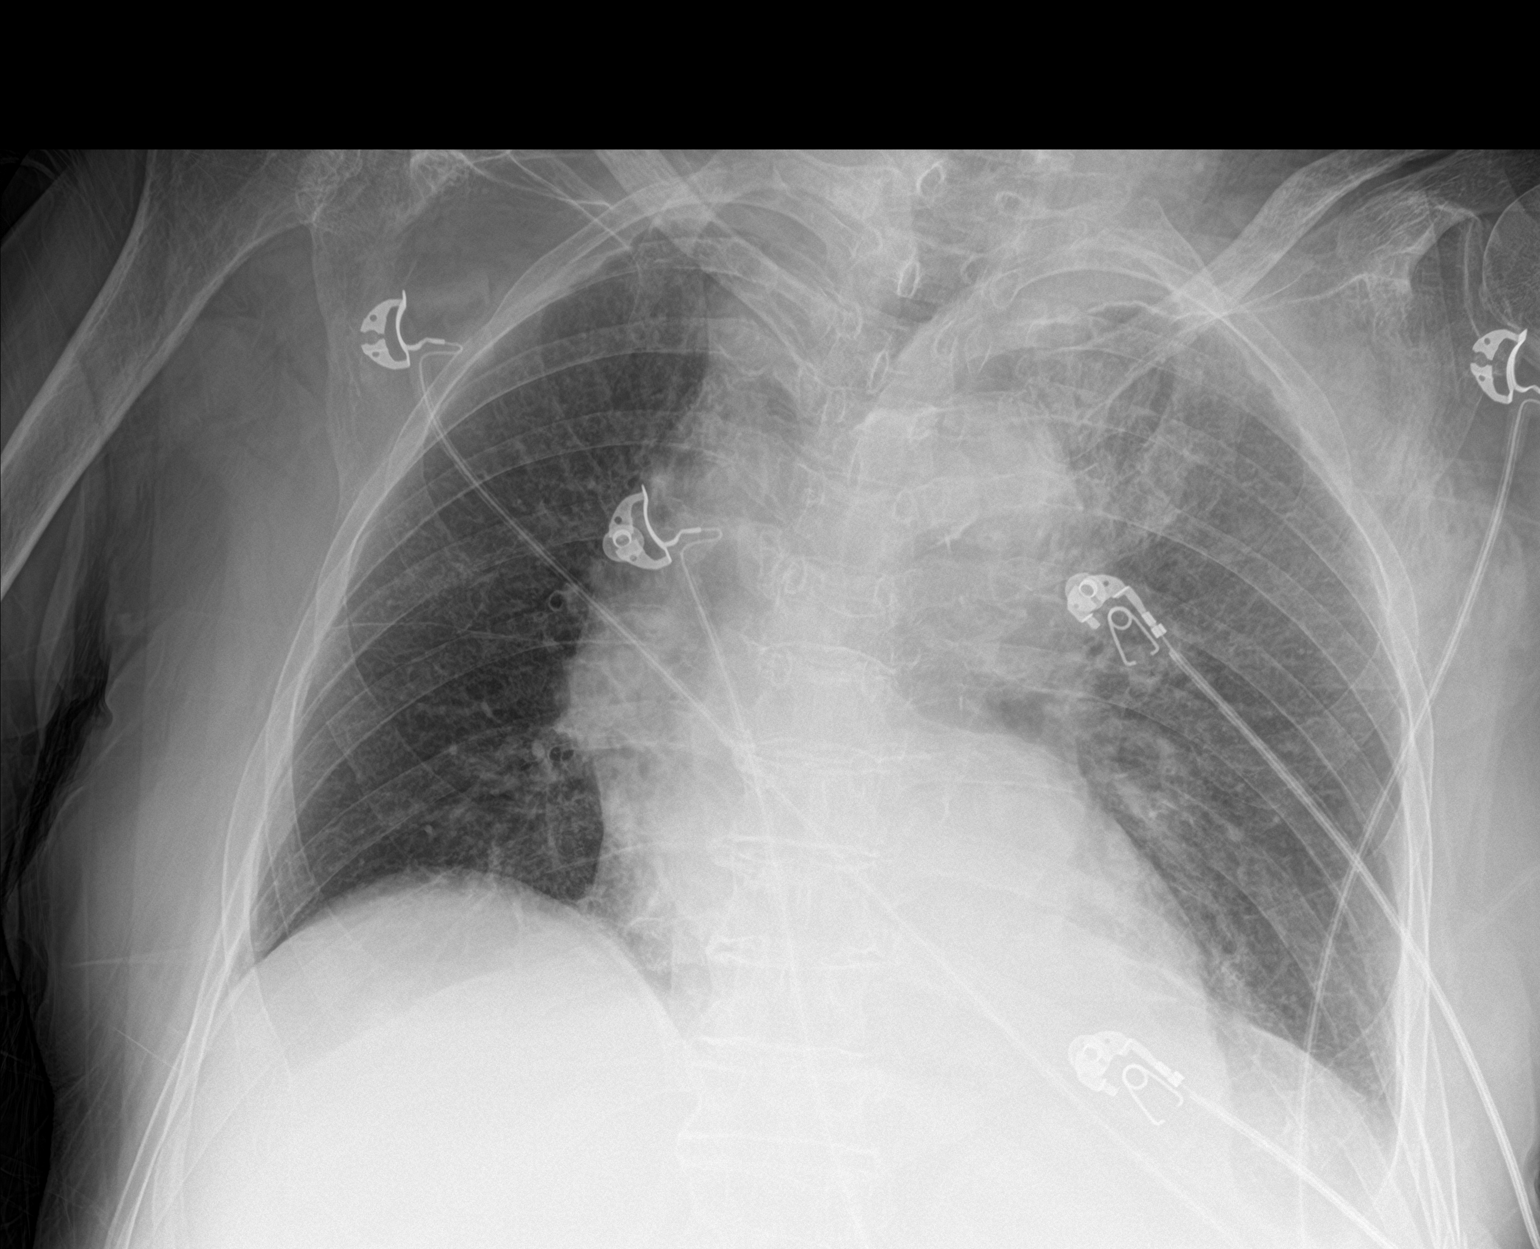

[2 of 2 positions shown; findings below may reference images not displayed]

FINDINGS: The lungs are well-aerated. Vascular congestion is noted. Mildly
increased interstitial markings on the left could reflect mild
transient interstitial edema. There is no evidence of pleural
effusion or pneumothorax.

The heart is normal in size; the mediastinal contour is within
normal limits. Left-sided rib fractures are again noted. Scattered
soft tissue air is noted along the left chest wall.
IMPRESSION: 1. Vascular congestion noted. Mildly increased interstitial markings
on the left could reflect mild transient interstitial edema.
2. Left-sided rib fractures again noted, as on the study from [DATE].

## 2019-05-12 IMAGING — DX DG THORACIC SPINE 2V
3 series · 3 of 3 positions shown · non-contrast
Comparison: CT of the chest performed 09/09/2017

CLINICAL DATA: Status post unwitnessed fall, with upper back pain.
Initial encounter.

EXAM:
THORACIC SPINE 2 VIEWS

[t-spine ap]
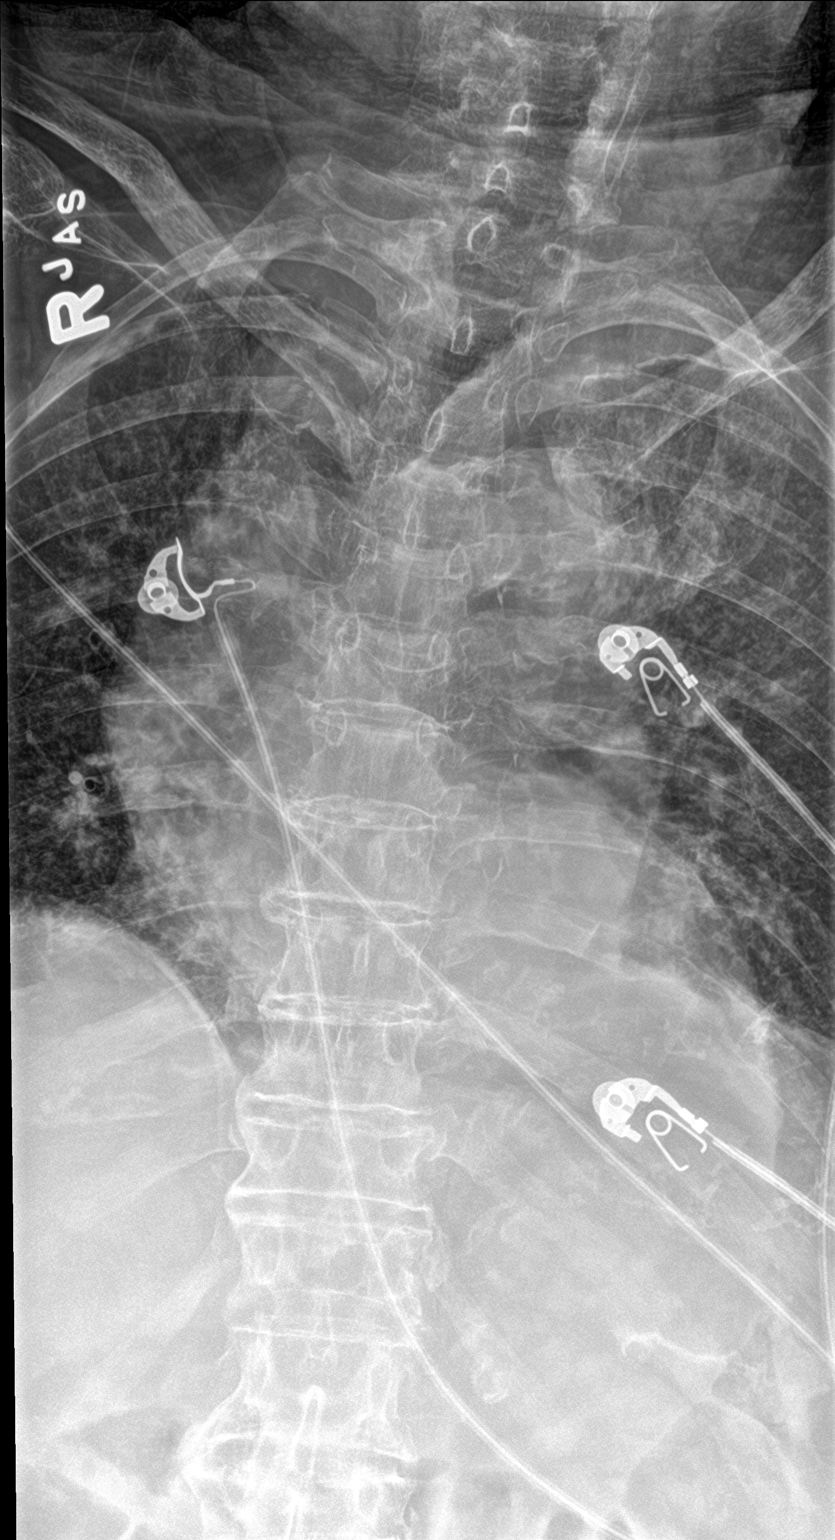

[t-spine lat]
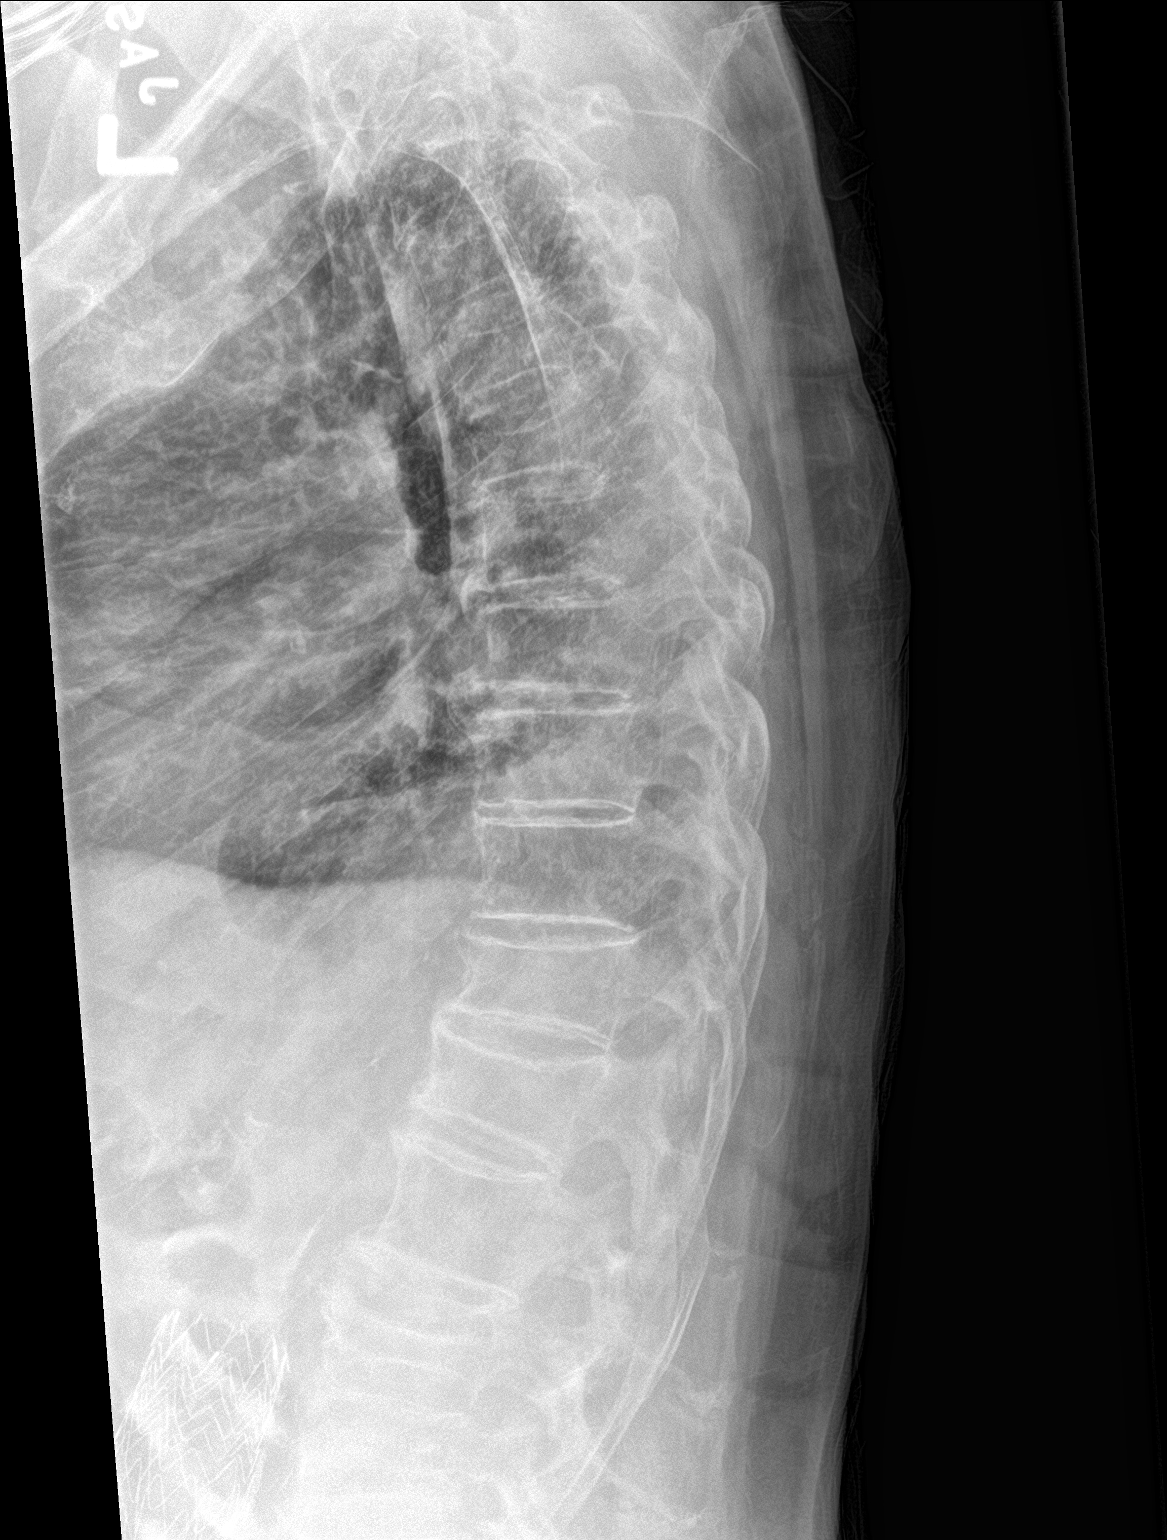

[t-spine swimmers]
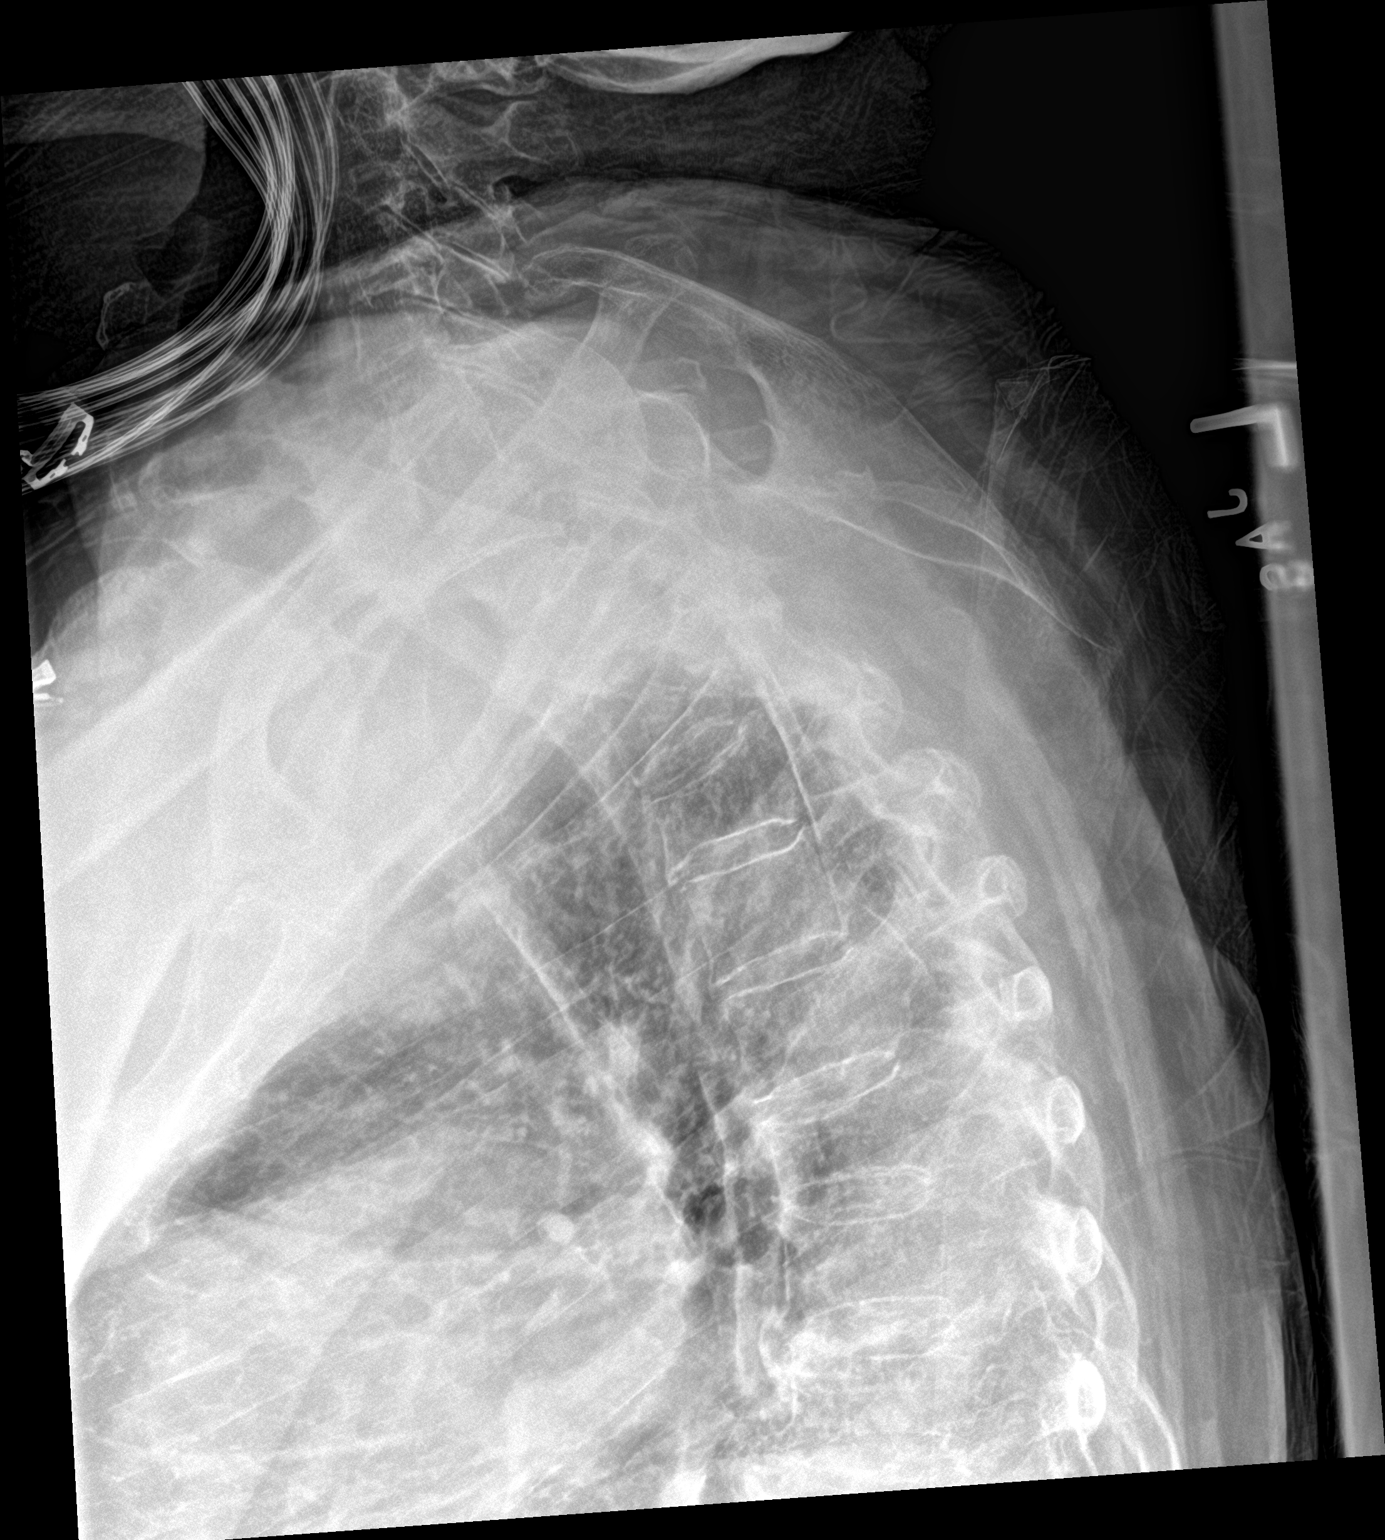

[3 of 3 positions shown; findings below may reference images not displayed]

FINDINGS: There is no evidence of acute fracture or subluxation. Vertebral
bodies demonstrate normal alignment. Intervertebral disc spaces are
preserved. Chronic compression deformities are again noted at the
mid and lower thoracic spine.

The visualized portions of both lungs are clear. The mediastinum is
unremarkable in appearance.
IMPRESSION: No evidence of acute fracture or subluxation along the thoracic
spine. Chronic compression deformities again noted at the mid and
lower thoracic spine.

## 2019-06-02 DEATH — deceased
